# Patient Record
Sex: Female | Born: 1935
Health system: Southern US, Community
[De-identification: ages and names within clinical notes are randomized; demographics above are authoritative.]

## PROBLEM LIST (undated history)

## (undated) DIAGNOSIS — I4891 Unspecified atrial fibrillation: Secondary | ICD-10-CM

## (undated) DIAGNOSIS — I509 Heart failure, unspecified: Secondary | ICD-10-CM

## (undated) DIAGNOSIS — D500 Iron deficiency anemia secondary to blood loss (chronic): Secondary | ICD-10-CM

## (undated) DIAGNOSIS — C439 Malignant melanoma of skin, unspecified: Secondary | ICD-10-CM

## (undated) DIAGNOSIS — G4733 Obstructive sleep apnea (adult) (pediatric): Secondary | ICD-10-CM

## (undated) DIAGNOSIS — E039 Hypothyroidism, unspecified: Secondary | ICD-10-CM

## (undated) DIAGNOSIS — W19XXXA Unspecified fall, initial encounter: Secondary | ICD-10-CM

## (undated) DIAGNOSIS — C55 Malignant neoplasm of uterus, part unspecified: Secondary | ICD-10-CM

## (undated) DIAGNOSIS — K56609 Unspecified intestinal obstruction, unspecified as to partial versus complete obstruction: Secondary | ICD-10-CM

## (undated) DIAGNOSIS — I10 Essential (primary) hypertension: Secondary | ICD-10-CM

## (undated) DIAGNOSIS — K219 Gastro-esophageal reflux disease without esophagitis: Secondary | ICD-10-CM

## (undated) HISTORY — DX: Unspecified atrial fibrillation: I48.91

## (undated) HISTORY — PX: INSERT / REPLACE / REMOVE PACEMAKER: SUR710

## (undated) HISTORY — PX: COLOSTOMY: SHX63

## (undated) HISTORY — DX: Unspecified fall, initial encounter: W19.XXXA

## (undated) HISTORY — PX: CHOLECYSTECTOMY: SHX55

## (undated) HISTORY — DX: Malignant neoplasm of uterus, part unspecified: C55

## (undated) HISTORY — PX: OTHER SURGICAL HISTORY: SHX169

## (undated) HISTORY — DX: Obstructive sleep apnea (adult) (pediatric): G47.33

## (undated) HISTORY — DX: Iron deficiency anemia secondary to blood loss (chronic): D50.0

## (undated) HISTORY — DX: Gastro-esophageal reflux disease without esophagitis: K21.9

## (undated) HISTORY — DX: Malignant melanoma of skin, unspecified: C43.9

## (undated) HISTORY — PX: SKIN BIOPSY: SHX1

## (undated) HISTORY — DX: Essential (primary) hypertension: I10

## (undated) HISTORY — DX: Hypothyroidism, unspecified: E03.9

## (undated) HISTORY — DX: Unspecified intestinal obstruction, unspecified as to partial versus complete obstruction: K56.609

## (undated) HISTORY — PX: TOTAL ABDOMINAL HYSTERECTOMY W/ BILATERAL SALPINGOOPHORECTOMY: SHX83

---

## 1997-11-10 ENCOUNTER — Encounter: Admission: RE | Admit: 1997-11-10 | Discharge: 1997-11-10 | Payer: Self-pay | Admitting: Family Medicine

## 2003-05-18 ENCOUNTER — Other Ambulatory Visit: Payer: Self-pay

## 2004-03-27 ENCOUNTER — Other Ambulatory Visit: Payer: Self-pay

## 2004-03-28 ENCOUNTER — Inpatient Hospital Stay: Payer: Self-pay | Admitting: Internal Medicine

## 2004-03-28 ENCOUNTER — Other Ambulatory Visit: Payer: Self-pay

## 2004-05-08 ENCOUNTER — Ambulatory Visit: Payer: Self-pay | Admitting: Internal Medicine

## 2004-05-10 ENCOUNTER — Ambulatory Visit: Payer: Self-pay | Admitting: Unknown Physician Specialty

## 2004-05-23 ENCOUNTER — Inpatient Hospital Stay: Payer: Self-pay | Admitting: General Surgery

## 2004-12-17 ENCOUNTER — Other Ambulatory Visit: Payer: Self-pay

## 2004-12-17 ENCOUNTER — Emergency Department: Payer: Self-pay | Admitting: Unknown Physician Specialty

## 2005-04-27 ENCOUNTER — Ambulatory Visit: Payer: Self-pay | Admitting: Urology

## 2005-05-06 ENCOUNTER — Other Ambulatory Visit: Payer: Self-pay

## 2005-05-06 ENCOUNTER — Inpatient Hospital Stay: Payer: Self-pay | Admitting: Internal Medicine

## 2005-05-11 ENCOUNTER — Emergency Department: Payer: Self-pay | Admitting: Emergency Medicine

## 2005-05-11 ENCOUNTER — Other Ambulatory Visit: Payer: Self-pay

## 2005-05-18 ENCOUNTER — Ambulatory Visit: Payer: Self-pay | Admitting: Internal Medicine

## 2005-05-24 ENCOUNTER — Ambulatory Visit: Payer: Self-pay | Admitting: Internal Medicine

## 2005-06-05 ENCOUNTER — Ambulatory Visit: Payer: Self-pay | Admitting: Internal Medicine

## 2005-06-09 ENCOUNTER — Ambulatory Visit: Payer: Self-pay | Admitting: Internal Medicine

## 2005-08-18 ENCOUNTER — Ambulatory Visit: Payer: Self-pay | Admitting: Internal Medicine

## 2006-04-06 ENCOUNTER — Emergency Department: Payer: Self-pay | Admitting: Emergency Medicine

## 2006-04-06 ENCOUNTER — Other Ambulatory Visit: Payer: Self-pay

## 2006-05-29 ENCOUNTER — Ambulatory Visit: Payer: Self-pay | Admitting: Internal Medicine

## 2006-06-13 ENCOUNTER — Ambulatory Visit: Payer: Self-pay | Admitting: Internal Medicine

## 2006-06-17 ENCOUNTER — Ambulatory Visit: Payer: Self-pay | Admitting: Urology

## 2006-10-18 ENCOUNTER — Ambulatory Visit: Payer: Self-pay | Admitting: Unknown Physician Specialty

## 2006-10-27 ENCOUNTER — Other Ambulatory Visit: Payer: Self-pay

## 2006-10-27 ENCOUNTER — Emergency Department: Payer: Self-pay | Admitting: Emergency Medicine

## 2006-10-31 ENCOUNTER — Ambulatory Visit: Payer: Self-pay | Admitting: Internal Medicine

## 2006-11-20 ENCOUNTER — Ambulatory Visit: Payer: Self-pay | Admitting: Cardiology

## 2006-11-20 LAB — CONVERTED CEMR LAB
Basophils Absolute: 0.1 10*3/uL (ref 0.0–0.1)
Creatinine, Ser: 0.9 mg/dL (ref 0.4–1.2)
Eosinophils Absolute: 0.4 10*3/uL (ref 0.0–0.6)
GFR calc Af Amer: 77 mL/min
GFR calc non Af Amer: 64 mL/min
HCT: 37.1 % (ref 36.0–46.0)
Hemoglobin: 13.2 g/dL (ref 12.0–15.0)
Lymphocytes Relative: 33.9 % (ref 12.0–46.0)
MCHC: 35.5 g/dL (ref 30.0–36.0)
MCV: 89.8 fL (ref 78.0–100.0)
Monocytes Absolute: 0.5 10*3/uL (ref 0.2–0.7)
Monocytes Relative: 5.4 % (ref 3.0–11.0)
Neutro Abs: 5 10*3/uL (ref 1.4–7.7)
Neutrophils Relative %: 55.5 % (ref 43.0–77.0)
Potassium: 4.3 meq/L (ref 3.5–5.1)
Sodium: 134 meq/L — ABNORMAL LOW (ref 135–145)
aPTT: 35.1 s (ref 26.5–36.5)

## 2006-11-27 ENCOUNTER — Ambulatory Visit: Payer: Self-pay | Admitting: Internal Medicine

## 2006-11-28 ENCOUNTER — Other Ambulatory Visit: Payer: Self-pay

## 2006-12-09 ENCOUNTER — Ambulatory Visit: Payer: Self-pay | Admitting: Internal Medicine

## 2007-01-20 ENCOUNTER — Ambulatory Visit: Payer: Self-pay | Admitting: Internal Medicine

## 2007-01-21 ENCOUNTER — Ambulatory Visit: Payer: Self-pay | Admitting: Internal Medicine

## 2007-01-22 ENCOUNTER — Ambulatory Visit: Payer: Self-pay | Admitting: Internal Medicine

## 2007-01-22 ENCOUNTER — Ambulatory Visit: Payer: Self-pay | Admitting: Cardiology

## 2007-01-23 ENCOUNTER — Other Ambulatory Visit: Payer: Self-pay

## 2007-02-04 ENCOUNTER — Ambulatory Visit: Payer: Self-pay | Admitting: Internal Medicine

## 2007-03-12 ENCOUNTER — Ambulatory Visit: Payer: Self-pay | Admitting: Internal Medicine

## 2007-05-13 ENCOUNTER — Ambulatory Visit: Payer: Self-pay | Admitting: Internal Medicine

## 2007-08-12 ENCOUNTER — Ambulatory Visit: Payer: Self-pay | Admitting: Internal Medicine

## 2007-09-23 ENCOUNTER — Ambulatory Visit: Payer: Self-pay | Admitting: Internal Medicine

## 2008-01-11 ENCOUNTER — Emergency Department: Payer: Self-pay | Admitting: Emergency Medicine

## 2008-03-22 ENCOUNTER — Ambulatory Visit: Payer: Self-pay | Admitting: Internal Medicine

## 2008-08-12 ENCOUNTER — Ambulatory Visit: Payer: Self-pay | Admitting: Internal Medicine

## 2008-09-16 ENCOUNTER — Ambulatory Visit: Payer: Self-pay

## 2008-09-17 ENCOUNTER — Encounter: Payer: Self-pay | Admitting: Internal Medicine

## 2009-01-11 ENCOUNTER — Emergency Department: Payer: Self-pay | Admitting: Emergency Medicine

## 2009-01-18 ENCOUNTER — Ambulatory Visit: Payer: Self-pay | Admitting: Internal Medicine

## 2009-01-22 DIAGNOSIS — Z950 Presence of cardiac pacemaker: Secondary | ICD-10-CM

## 2009-01-22 DIAGNOSIS — I10 Essential (primary) hypertension: Secondary | ICD-10-CM | POA: Insufficient documentation

## 2009-01-22 DIAGNOSIS — I4891 Unspecified atrial fibrillation: Secondary | ICD-10-CM

## 2009-01-24 ENCOUNTER — Ambulatory Visit: Payer: Self-pay | Admitting: Internal Medicine

## 2009-04-13 ENCOUNTER — Ambulatory Visit: Payer: Self-pay

## 2009-04-13 ENCOUNTER — Telehealth: Payer: Self-pay | Admitting: Internal Medicine

## 2009-04-13 ENCOUNTER — Encounter: Payer: Self-pay | Admitting: Internal Medicine

## 2009-07-12 ENCOUNTER — Ambulatory Visit: Payer: Self-pay | Admitting: Internal Medicine

## 2009-07-12 ENCOUNTER — Encounter: Payer: Self-pay | Admitting: Cardiovascular Disease

## 2009-07-20 ENCOUNTER — Ambulatory Visit: Payer: Self-pay | Admitting: Cardiovascular Disease

## 2009-08-16 ENCOUNTER — Ambulatory Visit: Payer: Self-pay | Admitting: Internal Medicine

## 2009-09-12 ENCOUNTER — Ambulatory Visit: Payer: Self-pay | Admitting: Internal Medicine

## 2009-09-19 ENCOUNTER — Encounter: Payer: Self-pay | Admitting: Internal Medicine

## 2009-09-19 ENCOUNTER — Telehealth: Payer: Self-pay | Admitting: Internal Medicine

## 2009-09-20 ENCOUNTER — Ambulatory Visit: Payer: Self-pay | Admitting: Internal Medicine

## 2009-09-22 ENCOUNTER — Telehealth: Payer: Self-pay | Admitting: Internal Medicine

## 2009-10-07 ENCOUNTER — Emergency Department: Payer: Self-pay | Admitting: Unknown Physician Specialty

## 2009-10-13 ENCOUNTER — Ambulatory Visit: Payer: Self-pay | Admitting: Internal Medicine

## 2009-10-27 ENCOUNTER — Encounter: Payer: Self-pay | Admitting: Internal Medicine

## 2009-11-08 ENCOUNTER — Ambulatory Visit: Payer: Self-pay | Admitting: Internal Medicine

## 2009-11-08 DIAGNOSIS — R531 Weakness: Secondary | ICD-10-CM

## 2009-12-06 ENCOUNTER — Ambulatory Visit: Payer: Self-pay | Admitting: Internal Medicine

## 2010-01-03 ENCOUNTER — Emergency Department: Payer: Self-pay | Admitting: Emergency Medicine

## 2010-04-03 ENCOUNTER — Ambulatory Visit: Payer: Self-pay | Admitting: Internal Medicine

## 2010-04-06 ENCOUNTER — Telehealth: Payer: Self-pay | Admitting: Internal Medicine

## 2010-04-12 ENCOUNTER — Ambulatory Visit: Payer: Self-pay | Admitting: Urology

## 2010-04-26 ENCOUNTER — Ambulatory Visit: Payer: Self-pay | Admitting: Urology

## 2010-07-11 NOTE — Progress Notes (Signed)
Summary: SURGICAL CLEARANCE  Phone Note From Other Clinic Call back at [phone]   Caller: LISA @ [facility] Call For: Kathryn Short Summary of Call: NEEDS SURGICAL CLEARANCE-A MESSAGE WAS LEFT ON OUR PHONE Initial call taken by: Harlon Flor,  April 06, 2010 12:27 PM  Follow-up for Phone Call        LM with Gery Pray at [facility] will call back. Benedict Needy, RN  April 06, 2010 12:40 PM   Pt having  Cystobladder biopsy 11/16 needs cardiac clearance please advise. Benedict Needy, RN  April 10, 2010 5:15 PM   Additional Follow-up for Phone Call Additional follow up Details #1::        ok for surgery Additional Follow-up by: Nathen May, MD, [facility],  April 10, 2010 6:38 PM     Appended Document: SURGICAL CLEARANCE letter faxed

## 2010-07-11 NOTE — Assessment & Plan Note (Signed)
Summary: per check out/sf   Primary Provider:  Corliss Parish  CC:  pt here for follow up.  She has not felt well in the last few days.  Marland Kitchen  History of Present Illness: MsPerkins  is seen because of recent episodes of weaknessand dizziness. He did take to the initiation of a loop diuretic started because of peripheral edema. The medication was stopped about 10 days or so ago and her symptoms have improved somewhat.  She describes orthostatic dizziness. There is a whirling sensation that goes with this that is relatively slow. It is worse in the morning. It is aggravated by turning. It is not aggravated by showers. She also describes that she could not get her words out correctly and adhering other people's words sounded also mixed up. However, her daughter who is sitting with her, says that at no point was her speech not clear.  She has normal left ventricular function by echo in 2008.  Her dizziness is still somewhat sporadic. He seems to be able to track A2 relative low blood pressure with systolics in the 100-110 range.  Current Medications (verified): 1)  Hyzaar 50-12.5 Mg Tabs (Losartan Potassium-Hctz) .... Daily 2)  Synthroid 25 Mcg Tabs (Levothyroxine Sodium) .... Take One Tablet Once Daily 3)  Metoprolol Tartrate 25 Mg Tabs (Metoprolol Tartrate) .... Two Times A Day 4)  Diltiazem Hcl 120 Mg Tabs (Diltiazem Hcl) .... Daily 5)  Methenamine Mandelate 1 Gm Tabs (Methenamine Mandelate) .... Daily 6)  Coumadin 5 Mg Tabs (Warfarin Sodium) .... As Directed 7)  Macrodantin 100 Mg Caps (Nitrofurantoin Macrocrystal) .... As Directed 8)  Zolpidem Tartrate 5 Mg Tabs (Zolpidem Tartrate) .... Take One Tablet At Bedtime 9)  Ester-C  Tabs (Bioflavonoid Products) .... Once Daily 10)  Omeprazole 20 Mg Cpdr (Omeprazole) .... Take One Capsule Once Daily  Allergies (verified): No Known Drug Allergies  Past History:  Past Medical History: Last updated: 01/22/2009 atrial fibrillation Pacemaker  Implantation-Medtronic  Hypertension Obstructive sleep apnea on BiPAP Gastroesophageal reflux disease Uterine cancer status post radiation therapy back in 1952 Hypothyroidism, treated Melanoma resected from her back Total abdominal hysterectomy and bilateral salpingo-oophorectomy  Colostomy for perforation. Cholecystectomy. Cystoscopy     Past Surgical History: Last updated: 01/22/2009 Total hysterectomy Implantation of Pacemaker-medtronic  Family History: Last updated: 01/22/2009 noncontributory  Social History: Last updated: 01/22/2009 Widowed-5 children Tobacco Use - No.  Alcohol Use - no  Vital Signs:  Patient profile:   77 year old female Height:      63 inches Pulse rate:   62 / minute Pulse rhythm:   regular BP sitting:   112 / 62  (left arm) Cuff size:   large  Vitals Entered By: Judithe Modest CMA (July 12, 2009 1:50 PM)  Physical Exam  General:  The patient was alert and oriented in no acute distress. HEENT Normal.  Neck veins were flat, carotids were brisk.  Lungs were clear.  Heart sounds were regular without murmurs or gallops.  Abdomen was soft with active bowel sounds. There is no clubbing cyanosis or edema. Skin Warm and dry    EKG  Procedure date:  07/12/2009  Findings:       atrial paced rhythm at 62 Intervals 0.18/0.09/0.43 Axis-37 T Wave flattening in the lateral leads  PPM Specifications Following MD:  Sherryl Manges, MD     PPM Vendor:  Medtronic     PPM Model Number:  ADDR01     PPM Serial Number:  [id number] PPM DOI:  11/27/2006     PPM Implanting MD:  Sherryl Manges, MD  Lead 1    Location: RA     DOI: 11/27/2006     Model #: 4782     Serial #: [id number]     Status: active Lead 2    Location: RV     DOI: 11/27/2006     Model #: 7846     Serial #: [id number]     Status: active  Magnet Response Rate:  BOL 85 ERI 65  Indications:  AFIB   PPM Follow Up Remote Check?  No Battery Voltage:  2.78 V     Battery Est.  Longevity:  7 years     Pacer Dependent:  No       PPM Device Measurements Atrium  Amplitude: 4.0 mV, Impedance: 376 ohms, Threshold: 0.75 V at 0.4 msec Right Ventricle  Amplitude: 5.6 mV, Impedance: 402 ohms, Threshold: 0.875 V at 0.4 msec  Episodes MS Episodes:  39     Percent Mode Switch:  10.1%     Coumadin:  Yes Ventricular High Rate:  0     Atrial Pacing:  82.8%     Ventricular Pacing:  0.2%  Parameters Mode:  MVP (R)     Lower Rate Limit:  60     Upper Rate Limit:  130 Paced AV Delay:  180     Sensed AV Delay:  140 Next Cardiology Appt Due:  01/09/2010 Tech Comments:  No parameter changes.  39 mode switch episodes, the longest 48:12 hours, with 0.7% heart rates >130bpm.  ROV 6 months [place] [facility]. Altha Harm, LPN  July 12, 2009 2:24 PM   Impression & Recommendations:  Problem # 1:  ATRIAL FIBRILLATION (ICD-427.31) Scontinues to have paroxysms of atrial fibrillation without evidence of rapid ventricular rates Her updated medication list for this problem includes:    Metoprolol Tartrate 25 Mg Tabs (Metoprolol tartrate) .Marland Kitchen..Marland Kitchen Two times a day    Coumadin 5 Mg Tabs (Warfarin sodium) .Marland Kitchen... As directed  Problem # 2:  DIZZINESS (ICD-780.4) Tseems to correlate with lower blood pressure. Will plan to change her diltiazem from the morning tonight and see if this doesn't help.  Problem # 3:  PACEMAKER, PERMANENT (ICD-V45.01) Device parameters and data were reviewed and no changes were made   Orders: EKG w/ Interpretation (93000)  Patient Instructions: 1)  Your physician has recommended you make the following change in your medication: Take your Diltizem at night 2)  Your physician recommends that you schedule a follow-up appointment in: 6 months with Gunnar Fusi in [place]

## 2010-07-11 NOTE — Assessment & Plan Note (Signed)
Summary: 4wk f/u sl after this visit pt will f/u in [place] per kle...   Primary Provider:  Dr. Marcello Fennel  CC:  4 week follow up.  Pt states she has been having a difficult time with concentration.  She reports that since last Afib episode she has felt this way.  .  History of Present Illness: Kathryn Short is seen in followup from her [place] with orthostasis dizziness and confusion.  She has a history of atrial fibrillation and bradycardia with prior pacemaker implantation. At her last visit we discontinued her metoprolol and decreased her amiodarone from 400-200  She comes in today continuing to complain of dizziness and weakness in her legs and difficulty with mentation and concentration.  Since having last been seen she underwent ultrasonography at [facility] demonstrating normal left ventricular function with moderate MR   She has no history of coronary disease. She recalls having had a stress test at [facility]; we are unable to find records.     Current Medications (verified): 1)  Synthroid 25 Mcg Tabs (Levothyroxine Sodium) .... Take One Tablet Once Daily 2)  Diltiazem Hcl 120 Mg Tabs (Diltiazem Hcl) .... At Bedtime 3)  Methenamine Mandelate 1 Gm Tabs (Methenamine Mandelate) .... Daily 4)  Coumadin 5 Mg Tabs (Warfarin Sodium) .... As Directed 5)  Macrodantin 100 Mg Caps (Nitrofurantoin Macrocrystal) .... As Directed 6)  Zolpidem Tartrate 5 Mg Tabs (Zolpidem Tartrate) .... Take One Tablet At Bedtime 7)  Ester-C  Tabs (Bioflavonoid Products) .... Once Daily 8)  Amiodarone Hcl 400 Mg Tabs (Amiodarone Hcl) .... , 1/2 Tab By Mouth Daily 9)  Furosemide 20 Mg Tabs (Furosemide) .... Take One Tablet By Mouth-Pt Takes Very Rarely 10)  Hyzaar 50-12.5 Mg Tabs (Losartan Potassium-Hctz) .... Take 1 Tablet By Mouth Once A Day 11)  Warfarin Sodium 1 Mg Tabs (Warfarin Sodium) .... Use As Directed By Anticoagualtion Clinic  Allergies (verified): No Known Drug Allergies  Past History:  Past Medical  History: Last updated: 01/22/2009 atrial fibrillation Pacemaker Implantation-Medtronic  Hypertension Obstructive sleep apnea on BiPAP Gastroesophageal reflux disease Uterine cancer status post radiation therapy back in 1952 Hypothyroidism, treated Melanoma resected from her back Total abdominal hysterectomy and bilateral salpingo-oophorectomy  Colostomy for perforation. Cholecystectomy. Cystoscopy     Past Surgical History: Last updated: 01/22/2009 Total hysterectomy Implantation of Pacemaker-medtronic  Family History: Last updated: 01/22/2009 noncontributory  Social History: Last updated: 01/22/2009 Widowed-5 children Tobacco Use - No.  Alcohol Use - no  Vital Signs:  Patient profile:   77 year old female Height:      63 inches Weight:      150 pounds BMI:     26.67 Pulse rate:   71 / minute Pulse rhythm:   regular BP sitting:   130 / 72  (left arm) Cuff size:   regular  Vitals Entered By: Judithe Modest CMA (Nov 08, 2009 3:42 PM)  Physical Exam  General:  The patient was alert and oriented in no acute distress. HEENT Normal.  Neck veins were flat, carotids were brisk.  Lungs were clear.  Heart sounds were regular without murmurs or gallops.  Abdomen was soft with active bowel sounds. There is no clubbing cyanosis; trace edema she is wearing support stockings Skin Warm and dry    EKG  Procedure date:  10/13/2009  Findings:      an atrial paced rhythm at 67 intervals 0.26/0.11/0.  PPM Specifications Following MD:  Sherryl Manges, MD     PPM Vendor:  Medtronic  PPM Model Number:  ADDR01     PPM Serial Number:  [id number] PPM DOI:  11/27/2006     PPM Implanting MD:  Sherryl Manges, MD  Lead 1    Location: RA     DOI: 11/27/2006     Model #: 0865     Serial #: [id number]     Status: active Lead 2    Location: RV     DOI: 11/27/2006     Model #: 8413     Serial #: [id number]     Status: active  Magnet Response Rate:  BOL 85 ERI 65  Indications:   AFIB   PPM Follow Up Battery Voltage:  2.77 V     Battery Est. Longevity:  6.5 yrs     Pacer Dependent:  No       PPM Device Measurements Atrium  Amplitude: PACED mV, Impedance: 393 ohms, Threshold: 1.00 V at 0.40 msec Right Ventricle  Amplitude: 8.00 mV, Impedance: 423 ohms, Threshold: 1.00 V at 0.40 msec  Episodes MS Episodes:  0     Percent Mode Switch:  O     Coumadin:  Yes Ventricular High Rate:  O     Atrial Pacing:  91.9%     Ventricular Pacing:  0.3%  Parameters Mode:  MVP (R)     Lower Rate Limit:  60     Upper Rate Limit:  130 Paced AV Delay:  180     Sensed AV Delay:  140 Tech Comments:  NO EPISODES SINCE LAST CHECK ON 10-13-09.  NORMAL DEVICE FUNCTION.  CHANGED RV AMPLITUDE FROM 2.00 TO 2.50 V.  ROV IN 6 MTHS AT [place].  Vella Kohler  Nov 08, 2009 4:09 PM  Impression & Recommendations:  Problem # 1:  ORTHOSTATIC DIZZINESS (ICD-780.4) her blood pressure is much better today. She continues to be quite weak and fatigued. I wonder whether this is a diltiazem. We'll plan to put her on hold and see how she does she is to let us know next couple of weeks  Problem # 2:  WEAKNESS (ICD-780.79) her weakness is described as difficulty with her legs as well as poor concentration. I wonder whether this is a manifestation of amiodarone neurotoxicity. Will plan to discontinue the amiodarone for now and see how it is that she does  Problem # 3:  PACEMAKER, PERMANENT (ICD-V45.01) Device parameters and data were reviewed and no changes were made  Problem # 4:  ATRIAL FIBRILLATION (ICD-427.31) she is currently holding sinus rhythm hopefully as we try to sort out her medication issues she will continue in sinus rhythm. Given that she has reversion to atrial fibrillation I've advised her daughter to give her diltiazem 120 mg daily Her updated medication list for this problem includes:    Coumadin 5 Mg Tabs (Warfarin sodium) .Marland Kitchen... As directed    Amiodarone Hcl 400 Mg Tabs (Amiodarone  hcl) .Marland Kitchen... , 1/2 tab by mouth daily    Warfarin Sodium 1 Mg Tabs (Warfarin sodium) ..... Use as directed by anticoagualtion clinic   Patient Instructions: 1)  Your physician recommends that you schedule a follow-up appointment in: 6-8 week 2)   hold amiodarone and diltiazem

## 2010-07-11 NOTE — Progress Notes (Signed)
Summary: c/o pulse rate   Phone Note Call from Patient Call back at Home Phone [phone] Call back at Work Phone  Call back at [phone]   Caller: Daughter- ann ruff Reason for Call: Talk to Nurse Summary of Call: pt seen this week b/c of b/p running high. c/o pulse on yesterday 64 . Initial call taken by: Lorne Skeens,  September 22, 2009 1:56 PM  Follow-up for Phone Call        Spoke with daughter [name]. She states pt's B/P is find . Daughter wants to know if heart rate of 64b/min NSR  its okay pt. has no c/o. I let daughter know a HR of 64 b/min it is fine as long it is in normal sinus rhythm. Pt's daughter verbalized understanding. Follow-up by: Ollen Gross, RN, BSN,  September 22, 2009 3:09 PM

## 2010-07-11 NOTE — Assessment & Plan Note (Signed)
Summary: F/U 4 MONTH/NE   Visit Type:  Follow-up Primary Provider:  Dr. Marcello Fennel  CC:  "Doing well"..  History of Present Illness: Kathryn Short is seen in followup   atrial fibrillation and bradycardia with prior pacemaker implantation.  She's also had significant symptoms of orthostatic dizziness, generalized malaise and fatigue.Last spring we held her diltiazem and her amiodarone to see if these were contributing to her symptoms; the markedly improved and we resumed her diltiazem to assist with rate control for her paroxysms of atrial fibrillation   She had done great on her current meds   recent ultrasonography at [facility] demonstrating normal left ventricular function with moderate MR   She has no history of coronary disease. She recalls having had a stress test at [facility]; we are unable to find records.     Current Medications (verified): 1)  Synthroid 50 Mcg Tabs (Levothyroxine Sodium) .... Take 1 Tablet By Mouth Once A Day 2)  Diltiazem Hcl 120 Mg Tabs (Diltiazem Hcl) .... At Bedtime 3)  Coumadin 5 Mg Tabs (Warfarin Sodium) .... As Directed 4)  Hyzaar 50-12.5 Mg Tabs (Losartan Potassium-Hctz) .... Take 1 Tablet By Mouth Once A Day 5)  Tmp/smz Ds .Marland KitchenMarland Kitchen. 160-800 Mg 1 Once Daily 6)  Multivitamins   Tabs (Multiple Vitamin) .... Once Daily 7)  Vitamin B Complex-C   Caps (B Complex-C) .... Once Daily 8)  Melatonin 300 Mcg Tabs (Melatonin) .Marland Kitchen.. 1-2 At Bedtime  Allergies (verified): No Known Drug Allergies  Past History:  Past Medical History: Last updated: 01/22/2009 atrial fibrillation Pacemaker Implantation-Medtronic  Hypertension Obstructive sleep apnea on BiPAP Gastroesophageal reflux disease Uterine cancer status post radiation therapy back in 1952 Hypothyroidism, treated Melanoma resected from her back Total abdominal hysterectomy and bilateral salpingo-oophorectomy  Colostomy for perforation. Cholecystectomy. Cystoscopy     Past Surgical History: Last updated:  01/22/2009 Total hysterectomy Implantation of Pacemaker-medtronic  Family History: Last updated: 01/22/2009 noncontributory  Social History: Last updated: 01/22/2009 Widowed-5 children Tobacco Use - No.  Alcohol Use - no  Risk Factors: Smoking Status: never (01/22/2009)  Vital Signs:  Patient profile:   77 year old female Height:      63 inches Weight:      147 pounds BMI:     26.13 Pulse rate:   74 / minute BP sitting:   140 / 78  (right arm) Cuff size:   regular  Vitals Entered By: Bishop Dublin, CMA (April 03, 2010 9:40 AM)  Physical Exam  General:  The patient was alert and oriented in no acute distress. HEENT Normal.  Neck veins were flat, carotids were brisk.  Lungs were clear.  Heart sounds were regular without murmurs or gallops.  Abdomen was soft with active bowel sounds. There is no clubbing cyanosis or edema. Skin Warm and dry    PPM Specifications Following MD:  Sherryl Manges, MD     PPM Vendor:  Medtronic     PPM Model Number:  ADDR01     PPM Serial Number:  [id number] PPM DOI:  11/27/2006     PPM Implanting MD:  Sherryl Manges, MD  Lead 1    Location: RA     DOI: 11/27/2006     Model #: 4098     Serial #: [id number]     Status: active Lead 2    Location: RV     DOI: 11/27/2006     Model #: 6213     Serial #: [id number]     Status: active  Magnet  Response Rate:  BOL 85 ERI 65  Indications:  AFIB   PPM Follow Up Remote Check?  No Battery Voltage:  2.78 V     Battery Est. Longevity:  6.5 years     Pacer Dependent:  No       PPM Device Measurements Atrium  Amplitude: 4.0 mV, Impedance: 407 ohms, Threshold: 0.75 V at 0.4 msec Right Ventricle  Amplitude: 8.0 mV, Impedance: 433 ohms, Threshold: 0.875 V at 0.4 msec  Episodes MS Episodes:  0     Percent Mode Switch:  0     Coumadin:  Yes Ventricular High Rate:  0     Atrial Pacing:  63.7%     Ventricular Pacing:  0.2%  Parameters Mode:  MVP (R)     Lower Rate Limit:  60     Upper Rate Limit:   130 Paced AV Delay:  180     Sensed AV Delay:  140 Next Cardiology Appt Due:  09/10/2010 Tech Comments:  No parameter changes.  Device function normal.  No [facility] @ this time.  ROV 6 months [place] [facility]. Altha Harm, LPN  April 03, 2010 10:07 AM   Impression & Recommendations:  Problem # 1:  WEAKNESS (ICD-780.79) resolved  Problem # 2:  PACEMAKER, PERMANENT (ICD-V45.01) Device parameters and data were reviewed and no changes were made  Problem # 3:  HYPERTENSION (ICD-401.9) stab;le Her updated medication list for this problem includes:    Diltiazem Hcl 120 Mg Tabs (Diltiazem hcl) .Marland Kitchen... At bedtime    Hyzaar 50-12.5 Mg Tabs (Losartan potassium-hctz) .Marland Kitchen... Take 1 tablet by mouth once a day  Problem # 4:  ATRIAL FIBRILLATION (ICD-427.31) no recurrent af Her updated medication list for this problem includes:    Coumadin 5 Mg Tabs (Warfarin sodium) .Marland Kitchen... As directed  Patient Instructions: 1)  Your physician recommends that you schedule a follow-up appointment in: 4months

## 2010-07-11 NOTE — Assessment & Plan Note (Signed)
Summary: BP CHECK/AMD   Visit Type:  rov Primary Provider:  Dr. Marcello Fennel   History of Present Illness: Mrs Porcelli is seen as an add-on today because of orthostatic lightheadedness palpitations in the setting of known PAF bradycardia with prior pacemaker implantation.  Her  orthostasis has been primarily in the morning and at night. She's had presyncope but no syncope. Her blood pressure has been quite labile over the last months also and there had been effort to try to adjust her medications to accommodate.  She has no history of coronary disease. She recalls having had a stress test at [facility]; we are unable to find records.  She's also had problems with her atrial fibrillation be quite rapid. AV Node blocking agents have been a challenge because of a low blood pressure  Current Problems (verified): 1)  Dizziness  (ICD-780.4) 2)  Pacemaker, Permanent  (ICD-V45.01) 3)  Atrial Fibrillation  (ICD-427.31) 4)  Hypertension  (ICD-401.9)  Current Medications (verified): 1)  Hyzaar 50-12.5 Mg Tabs (Losartan Potassium-Hctz) .... Daily 2)  Synthroid 25 Mcg Tabs (Levothyroxine Sodium) .... Take One Tablet Once Daily 3)  Metoprolol Tartrate 25 Mg Tabs (Metoprolol Tartrate) .... Two Times A Day 4)  Diltiazem Hcl 120 Mg Tabs (Diltiazem Hcl) .... At Bedtime 5)  Methenamine Mandelate 1 Gm Tabs (Methenamine Mandelate) .... Daily 6)  Coumadin 5 Mg Tabs (Warfarin Sodium) .... As Directed 7)  Macrodantin 100 Mg Caps (Nitrofurantoin Macrocrystal) .... As Directed 8)  Zolpidem Tartrate 5 Mg Tabs (Zolpidem Tartrate) .... Take One Tablet At Bedtime 9)  Ester-C  Tabs (Bioflavonoid Products) .... Once Daily 10)  Omeprazole 20 Mg Cpdr (Omeprazole) .... Take One Capsule Once Daily  Allergies (verified): No Known Drug Allergies  Past History:  Past Medical History: Last updated: 01/22/2009 atrial fibrillation Pacemaker Implantation-Medtronic  Hypertension Obstructive sleep apnea on BiPAP Gastroesophageal  reflux disease Uterine cancer status post radiation therapy back in 1952 Hypothyroidism, treated Melanoma resected from her back Total abdominal hysterectomy and bilateral salpingo-oophorectomy  Colostomy for perforation. Cholecystectomy. Cystoscopy     Past Surgical History: Last updated: 01/22/2009 Total hysterectomy Implantation of Pacemaker-medtronic  Family History: Last updated: 01/22/2009 noncontributory  Social History: Last updated: 01/22/2009 Widowed-5 children Tobacco Use - No.  Alcohol Use - no  Risk Factors: Smoking Status: never (01/22/2009)  Vital Signs:  Patient profile:   77 year old female Height:      63 inches Pulse rate:   76 / minute Pulse rhythm:   irregular BP sitting:   124 / 77  (left arm) Cuff size:   regular  Vitals Entered By: Mercer Pod (September 12, 2009 4:28 PM)  Physical Exam  General:  The patient was alert and oriented in no acute distress.Neck veins were flat, carotids were brisk. Lungs were clear. Heart sounds were irregular without murmurs or gallops. Abdomen was soft with active bowel sounds. There is no clubbing cyanosis or edema.she is hard of hearing, and affect is engaging    EKG  Procedure date:  09/12/2009  Findings:      he comfortably with a rapid ventricular response at 107 Intervals-/2007/23 3 Axis -28  PPM Specifications Following MD:  Sherryl Manges, MD     PPM Vendor:  Medtronic     PPM Model Number:  ADDR01     PPM Serial Number:  [id number] PPM DOI:  11/27/2006     PPM Implanting MD:  Sherryl Manges, MD  Lead 1    Location: RA     DOI:  11/27/2006     Model #: [id number]     Serial #: [id number]     Status: active Lead 2    Location: RV     DOI: 11/27/2006     Model #: [id number]     Serial #: [id number]     Status: active  Magnet Response Rate:  BOL 85 ERI 65  Indications:  AFIB   PPM Follow Up Remote Check?  No Battery Voltage:  2.78 V     Battery Est. Longevity:  6.5 years     Pacer Dependent:  No        PPM Device Measurements Atrium  Amplitude: 0.7 mV, Impedance: 376 ohms,  Right Ventricle  Amplitude: 8.0 mV, Impedance: 413 ohms,   Episodes MS Episodes:  29     Percent Mode Switch:  11.6%     Coumadin:  Yes Ventricular High Rate:  0     Atrial Pacing:  90.5%     Ventricular Pacing:  0.2%  Parameters Mode:  MVP (R)     Lower Rate Limit:  60     Upper Rate Limit:  130 Paced AV Delay:  180     Sensed AV Delay:  140 Tech Comments:  Ms. Voorheis was seen today as an add on because of rapid heart rates and near syncope.  A-fib with RVR.  > 50% heart rates in A-fib > 100bpm.  Total time in A-fib since 07/12/09 is 11.6%. Altha Harm, LPN  September 12, 3873 4:30 PM   Impression & Recommendations:  Problem # 1:  ATRIAL FIBRILLATION (ICD-427.31) Sis a history of atrial fibrillation is paroxysmal. She is quite aware of her paroxysms. There is partially also associated with a rapid ventricular response exercise intolerance and poor mentation. It also seems that the orthostatic intolerance is considerably worse when she is in atrial fibrillation.  We reviewed them variety of treatment strategies including Augmented rate control with digoxin, antiarrhythmic therapy including amiodarone and flecainide, and AV junction ablation. We discussed benefits and side effects. Because we don't have access to her stress test results, and given her age and the need for rate control, we will begin her on amiodarone.   We'll anticipate cardioversion next 48 hours if she has not converted spontaneously which has been her want in the past. Her updated medication list for this problem includes:    Metoprolol Tartrate 25 Mg Tabs (Metoprolol tartrate) .Marland Kitchen..Marland Kitchen Two times a day    Coumadin 5 Mg Tabs (Warfarin sodium) .Marland Kitchen... As directed    Digoxin 0.125 Mg Tabs (Digoxin) .Marland Kitchen... Take a half  tablet by mouth every 8 hours for 24 hours then daily    Amiodarone Hcl 400 Mg Tabs (Amiodarone hcl) .Marland Kitchen... Take one tablet by mouth twice a day  for 10 days, then take one tablet by mouth daily for 2 weeks, then 1/2 tab by mouth daily  H  Problem # 2:  ORTHOSTATIC DIZZINESS (ICD-780.4) her orthostatic dizziness is quite concerning. She is certainly at risk for falling. We'll plan to discontinue her Hyzaar both for blood pressure checks as well as his diuretic effects. I've also asked him to raise the head of her bed 6 inches to try to improve her response to standing occasionally at night when her symptoms have been most striking  Problem # 3:  PACEMAKER, PERMANENT (ICD-V45.01) Device parameters and data were reviewed and no changes were made.  the rapid rate during atrial fibrillation paroxysms however was reviewed with the patient  as one of her thoughts was to "just quit everything"  Problem # 4:  HYPERTENSION (ICD-401.9) This point he would have to tolerate some degree of hypertension to prevent her orthostatic hypotension. I am worried to some degree about the potential for amiodarone to aggravate autonomic intolerances. This however is quite uncommon. The following medications were removed from the medication list:    Hyzaar 50-12.5 Mg Tabs (Losartan potassium-hctz) .Marland Kitchen... Daily Her updated medication list for this problem includes:    Metoprolol Tartrate 25 Mg Tabs (Metoprolol tartrate) .Marland Kitchen..Marland Kitchen Two times a day    Diltiazem Hcl 120 Mg Tabs (Diltiazem hcl) .Marland Kitchen... At bedtime  Other Orders: T-Hepatic Function [id number]) T-TSH [id number])  Patient Instructions: 1)  Your physician recommends that you schedule a follow-up appointment in: 4 weeks 2)  Your physician has recommended you make the following change in your medication: start digoxin 0.0625 mg every 8 hours for 24 hours then daily, start amiodarone 400 mg twice a day for 2 weeks, then daily for 1 month, then 1/2 tablet daily. 3)  Please call us Wednesday to let us know how you are feeling. 4)  Please call your PCP and let them know that you have been started on digoxin and  amiodarone.  You will need to have your coumadin dose reduced by 30%.   Prescriptions: AMIODARONE HCL 400 MG TABS (AMIODARONE HCL) Take one tablet by mouth twice a day for 14 days, then take one tablet by mouth daily for 1 month, then 1/2 tab by mouth daily  #60 x 6   Entered by:   Charlena Cross, RN, BSN   Authorized by:   Nathen May, MD, [facility]   Signed by:   Charlena Cross, RN, BSN on 09/12/2009   Method used:   Electronically to        [facility] [phone]* (retail)       [address] [place], [place]  [address]       Ph: [phone]       Fax: [phone]   RxID:   [id number] DIGOXIN 0.125 MG TABS (DIGOXIN) Take a half  tablet by mouth every 8 hours for 24 hours then daily  #30 x 6   Entered by:   Charlena Cross, RN, BSN   Authorized by:   Nathen May, MD,  [facility]   Signed by:   Charlena Cross, RN, BSN on 09/12/2009   Method used:   Electronically to        [facility] [phone]* (retail)       [address] [place], [place]  [address]       Ph: [phone]       Fax: [phone]   RxID:   [id number]   Appended Document: BP CHECK/AMD    Clinical Lists Changes  Orders: Added new Test order of T-Hepatic Function [phone]) - Signed Added new Test order of T-TSH [id number]) - Signed

## 2010-07-11 NOTE — Assessment & Plan Note (Signed)
Summary: DEVICE/SAF   Visit Type:  Follow-up Primary Provider:  Dr. Marcello Fennel  CC:  Fatigue and weakness.Marland Kitchen  History of Present Illness: Mrs Wheelwright is seen in followup   atrial fibrillation and bradycardia with prior pacemaker implantation.  She's also had significant symptoms of orthostatic dizziness, generalized malaise and fatigue. At her last visit we held her diltiazem and her amiodarone to see if these were contributing to her symptoms.   She is much betterwith loss of nausea and gait disturbance. There is significantly less confusion. Her energy level remains poor. She describes muscle weakness but no muscle aches.   recent ultrasonography at [facility] demonstrating normal left ventricular function with moderate MR   She has no history of coronary disease. She recalls having had a stress test at [facility]; we are unable to find records.     Current Medications (verified): 1)  Synthroid 25 Mcg Tabs (Levothyroxine Sodium) .... Take One Tablet Once Daily 2)  Diltiazem Hcl 120 Mg Tabs (Diltiazem Hcl) .... At Bedtime 3)  Coumadin 5 Mg Tabs (Warfarin Sodium) .... As Directed 4)  Zolpidem Tartrate 5 Mg Tabs (Zolpidem Tartrate) .... Take One Tablet At Bedtime 5)  Hyzaar 50-12.5 Mg Tabs (Losartan Potassium-Hctz) .... Take 1 Tablet By Mouth Once A Day 6)  Tmp/smz Ds .Marland KitchenMarland Kitchen. 160-800 Mg 1 Once Daily  Allergies (verified): No Known Drug Allergies  Past History:  Past Medical History: Last updated: 01/22/2009 atrial fibrillation Pacemaker Implantation-Medtronic  Hypertension Obstructive sleep apnea on BiPAP Gastroesophageal reflux disease Uterine cancer status post radiation therapy back in 1952 Hypothyroidism, treated Melanoma resected from her back Total abdominal hysterectomy and bilateral salpingo-oophorectomy  Colostomy for perforation. Cholecystectomy. Cystoscopy     Past Surgical History: Last updated: 01/22/2009 Total hysterectomy Implantation of Pacemaker-medtronic  Family  History: Last updated: 01/22/2009 noncontributory  Social History: Last updated: 01/22/2009 Widowed-5 children Tobacco Use - No.  Alcohol Use - no  Risk Factors: Smoking Status: never (01/22/2009)  Vital Signs:  Patient profile:   77 year old female Height:      63 inches Weight:      152 pounds BMI:     27.02 Pulse rate:   90 / minute BP sitting:   137 / 78  (left arm)  Vitals Entered By: Bishop Dublin, CMA (December 06, 2009 4:05 PM)  Physical Exam  General:  The patient was alert and oriented in no acute distress. HEENT Normal.  Neck veins were flat, carotids were brisk.  Lungs were clear.  Heart sounds were regular without murmurs or gallops.  Abdomen was soft with active bowel sounds. There is no clubbing cyanosis or edema. Skin Warm and dry difficulty hearing   PPM Specifications Following MD:  Sherryl Manges, MD     PPM Vendor:  Medtronic     PPM Model Number:  ADDR01     PPM Serial Number:  [id number] PPM DOI:  11/27/2006     PPM Implanting MD:  Sherryl Manges, MD  Lead 1    Location: RA     DOI: 11/27/2006     Model #: 6440     Serial #: [id number]     Status: active Lead 2    Location: RV     DOI: 11/27/2006     Model #: 8756     Serial #: [id number]     Status: active  Magnet Response Rate:  BOL 85 ERI 65  Indications:  AFIB   PPM Follow Up Battery Voltage:  2.77 V  Battery Est. Longevity:  6.5 YRS     Pacer Dependent:  No      Episodes MS Episodes:  0     Coumadin:  Yes Ventricular High Rate:  0     Atrial Pacing:  56%      Parameters Mode:  MVP (R)     Lower Rate Limit:  60     Upper Rate Limit:  130 Paced AV Delay:  180     Sensed AV Delay:  140 Tech Comments:  NO EPISODES SINCE LAST CHECK ON 11-08-09.  Vella Kohler  December 06, 2009 4:25 PM  Impression & Recommendations:  Problem # 1:  WEAKNESS (ICD-780.79) Her weakness is much improved. Her fatigue persists, this following the discontinuation of her amiodarone. Her dizziness and confusion are  also much improved. We will resume her diltiazem at this time for help with rate control given her paroxysmal atrial fibrillation.  Problem # 2:  ATRIAL FIBRILLATION (ICD-427.31) paroxysmal AF-will resume diltiazem for rate control The following medications were removed from the medication list:    Amiodarone Hcl 400 Mg Tabs (Amiodarone hcl) .Marland Kitchen... , 1/2 tab by mouth daily    Warfarin Sodium 1 Mg Tabs (Warfarin sodium) ..... Use as directed by anticoagualtion clinic Her updated medication list for this problem includes:    Coumadin 5 Mg Tabs (Warfarin sodium) .Marland Kitchen... As directed  Problem # 3:  PACEMAKER, PERMANENT (ICD-V45.01) Device parameters and data were reviewed and no changes were made; no intercurrent episodes of atrial flutter ablated  Problem # 4:  HYPERTENSION (ICD-401.9) blood pressure adequately controlled on current medications; adding back diltiazem for atrial fibrillation The following medications were removed from the medication list:    Furosemide 20 Mg Tabs (Furosemide) .Marland Kitchen... Take one tablet by mouth-pt takes very rarely Her updated medication list for this problem includes:    Diltiazem Hcl 120 Mg Tabs (Diltiazem hcl) .Marland Kitchen... At bedtime    Hyzaar 50-12.5 Mg Tabs (Losartan potassium-hctz) .Marland Kitchen... Take 1 tablet by mouth once a day  Patient Instructions: 1)  Your physician wants you to follow-up in:   4 MONTHS You will receive a reminder letter in the mail two months in advance. If you don't receive a letter, please call our office to schedule the follow-up appointment. 2)  Your physician has recommended you make the following change in your medication: Start back on Diltiazem 120mg  one tablet everyday.

## 2010-07-11 NOTE — Assessment & Plan Note (Signed)
Summary: EC6/AMD   Visit Type:  new pt visit Primary [place]:  Dr. Marcello Fennel  CC:  pt sees Sherryl Manges to have her pacemaker checked....c/o sob..denies any cp or edema.  History of Present Illness: MsPerkins is a pleasant 77 yo woman with PMH of SSS, AV node ablation with dual chamber pacer, h/o HTN, presents for follow up of her sx of weakness and dizziness.   Dr. Graciela Husbands had suggested that she take her diltiazem at night to minimize the effects of hypotension. She felt worse with this change and has since changed back to taking diltiazem in the AM. She has not had an episode of weakness for some time and seems unconcerned about her episodes at this time. She does comment on having labile BP, and her daughter states that sometimes it is in the low 100s.   She is otherwise active with no complaints. She goes shopping frequently with no signs and overall feels well.   Problems Prior to Update: 1)  Dizziness  (ICD-780.4) 2)  Pacemaker, Permanent  (ICD-V45.01) 3)  Atrial Fibrillation  (ICD-427.31) 4)  Hypertension  (ICD-401.9)  Current Medications (verified): 1)  Hyzaar 50-12.5 Mg Tabs (Losartan Potassium-Hctz) .... Daily 2)  Synthroid 25 Mcg Tabs (Levothyroxine Sodium) .... Take One Tablet Once Daily 3)  Metoprolol Tartrate 25 Mg Tabs (Metoprolol Tartrate) .... Two Times A Day 4)  Diltiazem Hcl 120 Mg Tabs (Diltiazem Hcl) .... At Bedtime 5)  Methenamine Mandelate 1 Gm Tabs (Methenamine Mandelate) .... Daily 6)  Coumadin 5 Mg Tabs (Warfarin Sodium) .... As Directed 7)  Macrodantin 100 Mg Caps (Nitrofurantoin Macrocrystal) .... As Directed 8)  Zolpidem Tartrate 5 Mg Tabs (Zolpidem Tartrate) .... Take One Tablet At Bedtime 9)  Ester-C  Tabs (Bioflavonoid Products) .... Once Daily 10)  Omeprazole 20 Mg Cpdr (Omeprazole) .... Take One Capsule Once Daily  Allergies (verified): No Known Drug Allergies  Review of Systems  The patient denies anorexia, fever, weight loss, weight gain, vision  loss, decreased hearing, hoarseness, chest pain, syncope, dyspnea on exertion, peripheral edema, prolonged cough, headaches, hemoptysis, abdominal pain, melena, hematochezia, severe indigestion/heartburn, hematuria, incontinence, genital sores, muscle weakness, suspicious skin lesions, transient blindness, difficulty walking, depression, unusual weight change, abnormal bleeding, enlarged lymph nodes, angioedema, breast masses, and testicular masses.     Vital Signs:  Patient profile:   77 year old female Height:      63 inches Weight:      152 pounds Pulse rate:   62 / minute Pulse rhythm:   regular BP sitting:   144 / 80  (left arm) Cuff size:   large  Vitals Entered By: Danielle Rankin, CMA (July 20, 2009 4:10 PM)  Physical Exam  General:  HEENT is benign, neck is supple with no carotid bruits, no JVP, cv: RRR with no murmurs, resp: CTA b/l, ABD: benign, soft, NT,Extr: -c/e/c, neuro:  no focal deficts, pulses: equal and symmetrical upper and lower extremities.   EKG  Procedure date:  07/20/2009  Findings:      A V Paced rhythm, rate of 61 bpm.  PPM Specifications Following MD:  Sherryl Manges, MD     PPM Vendor:  [employer]     PPM Model Number:  ADDR01     PPM Serial Number:  [id number] PPM DOI:  11/27/2006     PPM Implanting MD:  Sherryl Manges, MD  Lead 1    Location: RA     DOI: 11/27/2006     Model #: 4098  Serial #: [id number]     Status: active Lead 2    Location: RV     DOI: 11/27/2006     Model #: 1478     Serial #: [id number]     Status: active  Magnet Response Rate:  BOL 85 ERI 65  Indications:  AFIB   PPM Follow Up Pacer Dependent:  No      Episodes Coumadin:  Yes  Parameters Mode:  MVP (R)     Lower Rate Limit:  60     Upper Rate Limit:  130 Paced AV Delay:  180     Sensed AV Delay:  140  Impression & Recommendations:  Problem # 1:  DIZZINESS (ICD-780.4) Etiology of her episodes of dizziness is uncertain though concerning for hypotension. I have  suggested that when she does not feel well, that she check her heart rate and BP. IF her rate is low, she can hold her losartan/hct, increase her by mouth fluids. She does take the HCTZ for edema though this has improved aftyer laser vein ablation surgery b/l. She will call us when she has an episode for further management if no improvement by holding losartan.   Problem # 2:  PACEMAKER, PERMANENT (ICD-V45.01) Followed by Dr. Graciela Husbands. Last eval on feb 1st, 2011.  Problem # 3:  ATRIAL FIBRILLATION (ICD-427.31) If she has paroxysmal a-fib that is not well controlled, we could d/c losartan to increase her metoprolol. Will leave this up to Dr. Graciela Husbands. uncertain if her episodes of weakness are associated with her periods of weakness. Perhaps she can keep a daily, or at least call the office when she does not feel well.  Her updated medication list for this problem includes:    Metoprolol Tartrate 25 Mg Tabs (Metoprolol tartrate) .Marland Kitchen..Marland Kitchen Two times a day    Coumadin 5 Mg Tabs (Warfarin sodium) .Marland Kitchen... As directed  Problem # 4:  HYPERTENSION (ICD-401.9) Labile BP per the patient with periods of hypotension. Can hold the losartan for low BP.  Her updated medication list for this problem includes:    Hyzaar 50-12.5 Mg Tabs (Losartan potassium-hctz) .Marland Kitchen... Daily    Metoprolol Tartrate 25 Mg Tabs (Metoprolol tartrate) .Marland Kitchen..Marland Kitchen Two times a day    Diltiazem Hcl 120 Mg Tabs (Diltiazem hcl) .Marland Kitchen... At bedtime

## 2010-07-11 NOTE — Assessment & Plan Note (Signed)
Summary: rov/jml   Visit Type:  Follow-up Primary Provider:  Dr. Marcello Fennel   History of Present Illness: Kathryn Short is seen as an add-on today because of episodes of dizziness and confusion.There is also does have issues with loss of balance.  She has a history of atrial fibrillation with prior pacemaker implantation.  Because of the dorsi of the symptoms last Friday she ended up at [facility] emergency room. A CT scan was negative for bleeding her laboratories were pretty normal except for a somewhat low INR  she [facility] we await those results   .  She has no history of coronary disease. She recalls having had a stress test at [facility]; we are unable to find records.     Current Medications (verified): 1)  Synthroid 25 Mcg Tabs (Levothyroxine Sodium) .... Take One Tablet Once Daily 2)  Metoprolol Tartrate 25 Mg Tabs (Metoprolol Tartrate) .... Two Times A Day 3)  Diltiazem Hcl 120 Mg Tabs (Diltiazem Hcl) .... At Bedtime 4)  Methenamine Mandelate 1 Gm Tabs (Methenamine Mandelate) .... Daily 5)  Coumadin 5 Mg Tabs (Warfarin Sodium) .... As Directed 6)  Macrodantin 100 Mg Caps (Nitrofurantoin Macrocrystal) .... As Directed 7)  Zolpidem Tartrate 5 Mg Tabs (Zolpidem Tartrate) .... Take One Tablet At Bedtime 8)  Ester-C  Tabs (Bioflavonoid Products) .... Once Daily 9)  Digoxin 0.125 Mg Tabs (Digoxin) .... Take A Half  Tablet By Mouth Every 8 Hours For 24 Hours Then Daily 10)  Amiodarone Hcl 400 Mg Tabs (Amiodarone Hcl) .... Take One Tablet By Mouth Twice A Day For 14 Days, Then Take One Tablet By Mouth Daily For 1 Month, Then 1/2 Tab By Mouth Daily 11)  Furosemide 20 Mg Tabs (Furosemide) .... Take One Tablet By Mouth Daily. 12)  Hyzaar 50-12.5 Mg Tabs (Losartan Potassium-Hctz) .... Take 1 Tablet By Mouth Once A Day 13)  Warfarin Sodium 1 Mg Tabs (Warfarin Sodium) .... Use As Directed By Anticoagualtion Clinic  Allergies (verified): No Known Drug  Allergies  Past History:  Past Medical History: Last updated: 01/22/2009 atrial fibrillation Pacemaker Implantation-Medtronic  Hypertension Obstructive sleep apnea on BiPAP Gastroesophageal reflux disease Uterine cancer status post radiation therapy back in 1952 Hypothyroidism, treated Melanoma resected from her back Total abdominal hysterectomy and bilateral salpingo-oophorectomy  Colostomy for perforation. Cholecystectomy. Cystoscopy     Vital Signs:  Patient profile:   77 year old female Height:      63 inches Weight:      151 pounds BMI:     26.85 Pulse rate:   67 / minute BP sitting:   100 / 60  (left arm)  Vitals Entered By: Kathryn Short CMA (Oct 13, 2009 1:55 PM)  Physical Exam  General:  The patient was alert and oriented in no acute distress. HEENT Normal.  Neck veins were flat, carotids were brisk.  Lungs were clear.  Heart sounds were regular without murmurs or gallops.  Abdomen was soft with active bowel sounds. There is no clubbing cyanosis or edema. Skin Warm and dry Neuro exam demonstrated a negative Romberg, normal finger-nose and no drift    EKG  Procedure date:  10/13/2009  Findings:      atrial paced rhythm at 67 Intervals 0.26/0.010/0.48  PPM Specifications Following MD:  Sherryl Manges, MD     PPM Vendor:  Medtronic     PPM Model Number:  ADDR01     PPM Serial Number:  [id number] DOI:  11/27/2006  PPM Implanting MD:  Sherryl Manges, MD  Lead 1    Location: RA     DOI: 11/27/2006     Model #: [id number]     Serial #: [id number]     Status: active Lead 2    Location: RV     DOI: 11/27/2006     Model #: 9562     Serial #: [id number]     Status: active  Magnet Response Rate:  BOL 85 ERI 65  Indications:  AFIB   PPM Follow Up Pacer Dependent:  No      Episodes Coumadin:  Yes  Parameters Mode:  MVP (R)     Lower Rate Limit:  60     Upper Rate Limit:  130 Paced AV Delay:  180     Sensed AV Delay:  140  Impression &  Recommendations:  Problem # 1:  ORTHOSTATIC DIZZINESS (ICD-780.4) She continues to have some dizziness and balance issues. This was previously attributed to her atrial fibrillation and her AV nodal blocking agents. her blood pressure today is some better at 100/60. This had been a problem before. I wonder whether it has something to do with her amiodarone and we will decrease it to 200 mg a day at this juncture with intentions at her next visit to decrease to 200 mg a day.  because of the low blood pressure I will plan to go ahead and discontinue her metoprolol at this juncture;  if blood pressure remains low at her next visit we will discontinue her diltiazem  Problem # 2:  PACEMAKER, PERMANENT (ICD-V45.01) Device parameters and data were reviewed and no changes were made  Problem # 3:  ATRIAL FIBRILLATION (ICD-427.31)  she had little atrial fibrillation her last visit and  none in the last 10 days The following medications were removed from the medication list:    Metoprolol Tartrate 25 Mg Tabs (Metoprolol tartrate) .Marland Kitchen..Marland Kitchen Two times a day    Digoxin 0.125 Mg Tabs (Digoxin) .Marland Kitchen... Take a half  tablet by mouth every 8 hours for 24 hours then daily Her updated medication list for this problem includes:    Coumadin 5 Mg Tabs (Warfarin sodium) .Marland Kitchen... As directed    Amiodarone Hcl 400 Mg Tabs (Amiodarone hcl) .Marland Kitchen... , 1/2 tab by mouth daily    Warfarin Sodium 1 Mg Tabs (Warfarin sodium) ..... Use as directed by anticoagualtion clinic  Patient Instructions: 1)  Your physician recommends that you schedule a follow-up appointment in: 4 weeks in [place]  2)  Your physician has recommended you make the following change in your medication: stop Metoprolol and Digoxin and decrease Amiodarone to 200mg  daily

## 2010-07-11 NOTE — Assessment & Plan Note (Signed)
Summary: pt walk in for bp check  Nurse Visit   Vital Signs:  Patient profile:   77 year old female Pulse rate:   60 / minute Resp:     20 per minute BP sitting:   164 / 74  (left arm)  Vitals Entered By: Meredith Staggers, RN (September 20, 2009 11:43 AM) Comments Pt walked in c/o elevated BP, brought home readings which range 140-170s/80-90s, pt c/o chest tightness and heaviness when BP is elevated, discussed info w/Dr Graciela Husbands he wants pt to restart hyzaar at previous dose.  Pt and daughter aware and verbalizes understanding, will resume med and cont. to monitor BP will c/b if BP too high or low or if chest pain comes back. Meredith Staggers, RN  September 20, 2009 12:22 PM    Current Medications (verified): 1)  Synthroid 25 Mcg Tabs (Levothyroxine Sodium) .... Take One Tablet Once Daily 2)  Metoprolol Tartrate 25 Mg Tabs (Metoprolol Tartrate) .... Two Times A Day 3)  Diltiazem Hcl 120 Mg Tabs (Diltiazem Hcl) .... At Bedtime 4)  Methenamine Mandelate 1 Gm Tabs (Methenamine Mandelate) .... Daily 5)  Coumadin 5 Mg Tabs (Warfarin Sodium) .... As Directed 6)  Macrodantin 100 Mg Caps (Nitrofurantoin Macrocrystal) .... As Directed 7)  Zolpidem Tartrate 5 Mg Tabs (Zolpidem Tartrate) .... Take One Tablet At Bedtime 8)  Ester-C  Tabs (Bioflavonoid Products) .... Once Daily 9)  Omeprazole 20 Mg Cpdr (Omeprazole) .... Take One Capsule Once Daily 10)  Digoxin 0.125 Mg Tabs (Digoxin) .... Take A Half  Tablet By Mouth Every 8 Hours For 24 Hours Then Daily 11)  Amiodarone Hcl 400 Mg Tabs (Amiodarone Hcl) .... Take One Tablet By Mouth Twice A Day For 14 Days, Then Take One Tablet By Mouth Daily For 1 Month, Then 1/2 Tab By Mouth Daily 12)  Furosemide 20 Mg Tabs (Furosemide) .... Take One Tablet By Mouth Daily.  Allergies (verified): No Known Drug Allergies   Patient Instructions: 1)  Restart Hyzaar 50/12.5mg  daily 2)  Continue to monitor BP 3)  Keep follow-up appt with Dr Graciela Husbands 10/13/09

## 2010-07-11 NOTE — Cardiovascular Report (Signed)
Summary: Office Visit   Office Visit   Imported By: Roderic Ovens 11/22/2009 11:00:43  _____________________________________________________________________  External Attachment:    Type:   Image     Comment:   External Document

## 2010-07-11 NOTE — Progress Notes (Signed)
Summary: [facility]   Imported By: Harlon Flor 09/23/2009 12:23:18  _____________________________________________________________________  External Attachment:    Type:   Image     Comment:   External Document

## 2010-07-11 NOTE — Cardiovascular Report (Signed)
Summary: Office Visit   Office Visit   Imported By: Roderic Ovens 07/13/2009 16:00:59  _____________________________________________________________________  External Attachment:    Type:   Image     Comment:   External Document

## 2010-07-11 NOTE — Miscellaneous (Signed)
  Clinical Lists Changes  Observations: Added new observation of ECHOINTERP: LV function normal EF 65% Moderate MR, mild TR and Al Mild pulmonary hypertension No AS  (10/12/2009 12:37)      Echocardiogram  Procedure date:  10/12/2009  Findings:      LV function normal EF 65% Moderate MR, mild TR and Al Mild pulmonary hypertension No AS

## 2010-07-11 NOTE — Progress Notes (Signed)
Summary: elevated BP  Phone Note Call from Patient Call back at Home Phone [phone]   Caller: Daughter Reason for Call: Talk to Nurse Summary of Call: elevated bp, seen in [place] [facility] today, very concerned, request call back Initial call taken by: Migdalia Dk,  September 19, 2009 4:48 PM  Follow-up for Phone Call        pt was a walkin at [facility] on 09/19/09 @ approx. 2:00p.  Pt stated that  she needed to be seen immediately because of her BP.  Receptionist checked BP via dinamap and BP was 183/74.  Pt had recently been in office with hypotension secondary to afib and was seen by Dr. Graciela Husbands.  Per Dr. Ethelene Hal last office note, because of pts orthostatic hypotension, pt would need to tolerate some hypertension. This was relayed to pt by MD.  Information was relayed to Dr. Shirlee Latch who was DOD and Dr. Shirlee Latch stated that BP was ok given history- pt to go home and rest.  Pt stated that she was going to ED for evaluation.  Dr. Shirlee Latch advised that this was not necessary.  Pt left.  Shortly afterwards, office rec'd a call from Dr. Marcello Fennel that pt was in his office for evaluation.  Per Dr. Saunders Glance office note, pt was advised to stop amio and was set up with second opinion with Dr. Lady Gary.   Follow-up by: Charlena Cross, RN, BSN,  September 20, 2009 12:20 PM  Additional Follow-up for Phone Call Additional follow up Details #1::        See nurse visit from today. Gypsy Balsam RN BSN  September 20, 2009 1:57 PM

## 2010-07-11 NOTE — Cardiovascular Report (Signed)
Summary: Office Visit   Office Visit   Imported By: Roderic Ovens 10/28/2009 16:20:56  _____________________________________________________________________  External Attachment:    Type:   Image     Comment:   External Document

## 2010-08-04 ENCOUNTER — Encounter: Payer: Self-pay | Admitting: Internal Medicine

## 2010-08-04 ENCOUNTER — Encounter (INDEPENDENT_AMBULATORY_CARE_PROVIDER_SITE_OTHER): Payer: Medicare Other | Admitting: Internal Medicine

## 2010-08-04 DIAGNOSIS — R05 Cough: Secondary | ICD-10-CM

## 2010-08-04 DIAGNOSIS — I498 Other specified cardiac arrhythmias: Secondary | ICD-10-CM

## 2010-08-04 DIAGNOSIS — Z950 Presence of cardiac pacemaker: Secondary | ICD-10-CM

## 2010-08-04 DIAGNOSIS — I2589 Other forms of chronic ischemic heart disease: Secondary | ICD-10-CM

## 2010-08-08 NOTE — Assessment & Plan Note (Signed)
Summary: f19m/amd   Visit Type:  Follow-up Primary Provider:  Dr. Marcello Fennel  CC:  "Doing well.".  History of Present Illness: Kathryn Short is seen in followup for atrial fibrillation and bradycardia with prior pacemaker implantation.    She had done great on her current meds  The patient denies SOB, chest pain, edema or palpitations  His headache cough for the last 2 or 3 weeks mildly productive but unassociated with fever  2010 ultrasonography at [facility] demonstrating normal left ventricular function with moderate MR   She has no history of coronary disease. She recalls having had a stress test at [facility]; we are unable to find records.     Current Medications (verified): 1)  Synthroid 50 Mcg Tabs (Levothyroxine Sodium) .... Take 1 Tablet By Mouth Once A Day 2)  Diltiazem Hcl 120 Mg Tabs (Diltiazem Hcl) .... At Bedtime 3)  Coumadin 4 Mg Tabs (Warfarin Sodium) .... Take As Directed By Pcp 4)  Hyzaar 50-12.5 Mg Tabs (Losartan Potassium-Hctz) .... Take 1 Tablet By Mouth Once A Day 5)  Tmp/smz Ds .Marland KitchenMarland Kitchen. 160-800 Mg 1 Once Daily 6)  Multivitamins   Tabs (Multiple Vitamin) .... Once Daily 7)  Vitamin B Complex-C   Caps (B Complex-C) .... Once Daily 8)  Melatonin 300 Mcg Tabs (Melatonin) .Marland Kitchen.. 1-2 At Bedtime 9)  Probiotic  Allergies (verified): No Known Drug Allergies  Past History:  Past Medical History: Last updated: 01/22/2009 atrial fibrillation Pacemaker Implantation-Medtronic  Hypertension Obstructive sleep apnea on BiPAP Gastroesophageal reflux disease Uterine cancer status post radiation therapy back in 1952 Hypothyroidism, treated Melanoma resected from her back Total abdominal hysterectomy and bilateral salpingo-oophorectomy  Colostomy for perforation. Cholecystectomy. Cystoscopy     Past Surgical History: Last updated: 01/22/2009 Total hysterectomy Implantation of Pacemaker-medtronic  Family History: Last updated: 01/22/2009 noncontributory  Social History: Last  updated: 01/22/2009 Widowed-5 children Tobacco Use - No.  Alcohol Use - no  Risk Factors: Smoking Status: never (01/22/2009)  Vital Signs:  Patient profile:   77 year old female Height:      63 inches Weight:      154 pounds BMI:     27.38 Pulse rate:   74 / minute BP sitting:   120 / 60  (left arm) Cuff size:   regular  Vitals Entered By: Bishop Dublin, CMA (August 04, 2010 11:12 AM)  Physical Exam  General:  The patient was alert and oriented in no acute distress. HEENT Normal.  Neck veins were flat, carotids were brisk.  Lungs were clear.  Heart sounds were regular without murmurs or gallops.  Abdomen was soft with active bowel sounds. There is no clubbing cyanosis or edema. Skin Warm and dry    PPM Specifications Following MD:  Sherryl Manges, MD     PPM Vendor:  Medtronic     PPM Model Number:  ADDR01     PPM Serial Number:  [id number] PPM DOI:  11/27/2006     PPM Implanting MD:  Sherryl Manges, MD  Lead 1    Location: RA     DOI: 11/27/2006     Model #: [id number]     Serial #: [id number]     Status: active Lead 2    Location: RV     DOI: 11/27/2006     Model #: [id number]     Serial #: [id number]     Status: active  Magnet Response Rate:  BOL 85 ERI 65  Indications:  AFIB   PPM Follow Up Pacer Dependent:  No      Episodes Coumadin:  Yes  Parameters Mode:  MVP (R)     Lower Rate Limit:  60     Upper Rate Limit:  130 Paced AV Delay:  180     Sensed AV Delay:  140  Impression & Recommendations:  Problem # 1:  ATRIAL FIBRILLATION (ICD-427.31)  Her updated medication list for this problem includes:    Coumadin 4 Mg Tabs (Warfarin sodium) .Marland Kitchen... Take as directed by pcp  Her updated medication list for this problem includes:    Coumadin 4 Mg Tabs (Warfarin sodium) .Marland Kitchen... Take as directed by pcp  Problem # 2:  SINUS NODE DYSFUNCTION (ICD-427.81)  he is atrially paced 35% of the time Her updated medication list for this problem includes:    Diltiazem Hcl 120 Mg Tabs  (Diltiazem hcl) .Marland Kitchen... At bedtime    Coumadin 4 Mg Tabs (Warfarin sodium) .Marland Kitchen... Take as directed by pcp  Her updated medication list for this problem includes:    Diltiazem Hcl 120 Mg Tabs (Diltiazem hcl) .Marland Kitchen... At bedtime    Coumadin 4 Mg Tabs (Warfarin sodium) .Marland Kitchen... Take as directed by pcp  Problem # 3:  PACEMAKER, PERMANENT (ICD-V45.01) Device parameters and data were reviewed and no changes were made  Problem # 4:  COUGH (ICD-786.2)  we will give her a Z-Pak. Her updated medication list for this problem includes:    Diltiazem Hcl 120 Mg Tabs (Diltiazem hcl) .Marland Kitchen... At bedtime    Coumadin 4 Mg Tabs (Warfarin sodium) .Marland Kitchen... Take as directed by pcp  Her updated medication list for this problem includes:    Diltiazem Hcl 120 Mg Tabs (Diltiazem hcl) .Marland Kitchen... At bedtime    Coumadin 4 Mg Tabs (Warfarin sodium) .Marland Kitchen... Take as directed by pcp  Patient Instructions: 1)  Your physician recommends that you schedule a follow-up appointment in: 6 months 2)  Your physician has recommended you make the following change in your medication. START Z pack as directed.  Prescriptions: ZITHROMAX 1 GM PACK (AZITHROMYCIN) Take one tablet once daily  #1 pack x 0   Entered by:   Lanny Hurst RN   Authorized by:   Nathen May, MD, [facility]   Signed by:   Lanny Hurst RN on 08/04/2010   Method used:   Electronically to        [facility] [phone]* (retail)       [address] [place], [place]  [address]       Ph: [phone]       Fax: [phone]   RxID:   [id number]

## 2010-08-22 ENCOUNTER — Ambulatory Visit: Payer: Self-pay | Admitting: Internal Medicine

## 2010-10-24 NOTE — Letter (Signed)
December 09, 2006    Dr. Corliss Parish  [address]  [place], [place] [address]   RE:  Kathryn Short  MRN:  [id number]  /  DOB:  March 12, 1922   Dear Kathryn Short:   Kathryn Short comes in today to get her wound checked after a pacemaker.  She is feeling terrific.  She says she is sleeping well.  Her energy  level is better.   Her current medications include metoprolol 25 b.i.d., Cardizem 120 mg  and her other medications are unchanged.   Her wound looked really good.  I took off the Steri-Strips.  Her heart  sounds were regular.   We will plan to see her again in about 10 weeks' time to further  reprogram the device.  At this point, no other medication changes were  made.   If there is anything further I can do, please do not hesitate to contact  me.    Sincerely,      Kathryn Salvia, MD, [facility]  Electronically Signed    SCK/MedQ  DD: 12/09/2006  DT: 12/10/2006  Job #: [id number]

## 2010-10-24 NOTE — H&P (Signed)
[facility] ADMISSION   NAME:Short, [name]                      MRN:          [id number]  DATE:11/20/2006                            DOB:          June 13, 1921    The patient was seen with Dr. Berton Mount today.   This is a very pleasant 77 year old widowed white female patient that  Dr. Graciela Husbands saw back in December who was in atrial fibrillation with some  bradycardia.  He at that time recommended a stress Myoview and auto  directional atrial fibrillation monitor. The patient had an  echocardiogram performed July 10, 2006, that showed normal LV  systolic function, ejection fraction 50%, mild LVH, mild aortic and  tricuspid regurgitation with moderate MR. They did not send [place] a copy of  her stress test, but the daughter tells [place] it was normal.  She now  presents with a 74-month history of worsening dizziness.  She says it  happens approximately 3 times a week.  It can occur when sitting or  standing, and things start spinning around her, and she is almost to the  point of passing out, but then it improves.  She says her heart beat is  very slow during this, and then it clears up.  She was in the emergency  room 3 weeks ago for rapid atrial fibrillation at a rate of 180, treated  with Cardizem and metoprolol.  She was in the ER for 3 hours.  She was  referred here today for possible pacemaker placement.  A 48-hour monitor  done showed bradycardia in the 40s with some SVT up to 150 beats per  minute.  She had two 15-beat runs.   ALLERGIES:  ACE INHIBITOR, ARB cause cough.  She does not tolerate  FOSAMAX.   CURRENT MEDICATIONS:  1. Coumadin 5 mg as directed.  2. Calcium 600 mg.  3. Synthroid 0.025 mg daily.  4. Cardizem CD 120 mg daily.  5. Prilosec 20 mg daily.  6. Metoprolol 25 mg daily.  7. Lunesta 1 mg nightly p.r.n.  8. Levaquin 500 mg daily.  9. Hyzaar 50/12 daily.   PAST MEDICAL HISTORY:   Significant for:  1. Hypertension.  2. Obstructive sleep apnea on BiPAP.  3. Gastroesophageal reflux disease.  4. Uterine cancer status post radiation therapy back in 1952.  5. Hypothyroidism, treated.  6. Melanoma resected from her back.  7. Total abdominal hysterectomy and bilateral salpingo-oophorectomy.  8. Colostomy for perforation.  9. Cholecystectomy.  10.Cystoscopy.   SOCIAL HISTORY:  She is widowed.  She has five children.  She lives  alone, but her daughter lives on one side of her and her granddaughter  on the other side.  She denies alcohol or cigarette use or abuse.   FAMILY HISTORY:  Insignificant.   REVIEW OF SYSTEMS:  Significant for dizziness and presyncopal signs and  symptoms.  No dyspepsia, dysphagia, nausea, vomiting, change in bowels,  melena.  CARDIOPULMONARY:  See HPI. She does have some lower extremity  edema that improved a little bit when Cozaar was changed to Hyzaar,  but  she does not keep her legs elevated during the day.   PHYSICAL EXAMINATION:  GENERAL:  This is a very pleasant, young-looking,  77 year old white female in no acute distress.  VITAL SIGNS:  Blood pressure 128/76, pulse 57, weight 156.  HEENT:  Head is normocephalic and atraumatic.  Extraocular movements  intact.  Pupils equal, round, and reactive to light and accommodation.  Nasal mucosa moist.  Throat without erythema or exudate. She does have  bilateral hearing aids in place.  NECK:  Without JVD or thyroid enlargement.  LUNGS:  Clear anterior, posterior, and lateral.  HEART:  Regular rate and rhythm at 60 beats per minute.  Normal S1 and  S2, mid systolic click left sternal border and a 1 to 2/6 systolic  murmur at the apex.  ABDOMEN:  Soft, without organomegaly, mass, lesions, or abnormal  tenderness.  EXTREMITIES:  Trace edema, left ankle greater than right.  Positive  distal pulses.  NEUROLOGIC:  Without focal deficit.   EKG:  Sinus bradycardia at 57 beats per minute,  otherwise no acute  change.   IMPRESSION:  1. Sick sinus syndrome with paroxysmal atrial fibrillation with rapid      ventricular rate as well as significant bradycardia, referred for      pacemaker.  2. Hypertension.  3. History of melanoma resected from her back.  4. Gastroesophageal reflux disease.  5. Chronic lower extremity edema secondary to venous stasis changes.  6. Obstructive sleep apnea on BiPAP.  7. Uterine cancer status post radiation therapy.  8. Hypothyroidism, treated.   PLAN:  At this time, Dr. Graciela Husbands has recommended pacemaker.  He has  explained the risks and benefits of this including infection, lead wire  movement, and perforation. The patient agrees and  wants to proceed.  This will be done in [place] next Wednesday.  She  is on Coumadin, so this will need to be held.      Jacolyn Reedy, PA-C  Electronically Signed      Duke Salvia, MD, [facility]  Electronically Signed   ML/MedQ  DD: 11/20/2006  DT: 11/20/2006  Job #: [id number]   cc:   Corliss Parish

## 2010-10-24 NOTE — Assessment & Plan Note (Signed)
[facility] OFFICE NOTE   NAME:Kathryn Short, Kathryn Short                      MRN:          [id number]  DATE:02/04/2007                            DOB:          15-Mar-1922    HISTORY OF PRESENT ILLNESS:  Ms. Tirone is an 77 year old woman with a  history of atrial fibrillation and sick sinus syndrome with symptomatic  bradycardia. She recently underwent pacemaker placement by Dr. Berton Mount. This was a complicated procedure, as she had dislodgement of both  leads and spent nearly 3 1/2 hours in the ET lab. She now says that she  feels much better. Her pacer site was sore for a while but is finally  getting more comfortable. She has not had any fevers, no hematoma, or  bruising. The remainder of her past medical history is notable for  hypertension, obstructive sleep apnea on BIPAP, history of melanoma and  hyperlipidemia. She had an echocardiogram performed in January that  showed an ejection fraction of 50% with mild LVH, mild aortic  insufficiency, and tricuspid insufficiency and moderate MR. She also had  a stress test previously in the recent past that was normal, according  to her family. Overall, she is doing quite well. Denies any palpitations  or significant dyspnea. She does have chronic lower extremity edema,  secondary to venous insufficiency. She has not had any syncope.   CURRENT MEDICATIONS:  Coumadin, Synthroid 25 mcg daily, Cardizem CD 120  daily, Prilosec 20 daily, Lunesta, Hyzaar 50/12.5, Metoprolol 25 bid,  calcium, and iron.   ALLERGIES:  She has a cough with ace inhibitor.   PHYSICAL EXAMINATION:  GENERAL:  An elderly woman in no acute distress.  She is hard of hearing and wears hearing aids.  LUNGS:  Ambulates around the clinic without any respiratory difficulty.  VITAL SIGNS:  Blood pressure 138/68, heart rate 60, weight 154.  HEENT:  Normal with some mild xanthelasma.  NECK:  Supple. JVP is  about 6 cm or water. Carotids are 2+ bilaterally  without bruits. There is no lymphadenopathy or thyromegaly.  CARDIAC:  PMI is non-displaced. She has a regular rate and rhythm with  no murmur, rub, or gallop. Pacemaker site looks well healed.  LUNGS:  Clear.  ABDOMEN:  Obese, nontender, and nondistended. No hepatosplenomegaly, no  bruits, no masses.  EXTREMITIES:  Warm with no cyanosis or clubbing. She does have  significant venous varicosities and trace edema throughout. No rash.  NEUROLOGIC:  Alert and oriented times three. Cranial nerves 2-12 are  grossly intact except for her hearing. Moves all 4 extremities without  difficulty.   DIAGNOSTIC STUDIES:  EKG is pending.   ASSESSMENT/PLAN:  1. Sick sinus syndrome with atrial fibrillation. She is status post      pacemaker and doing well. She will continue Coumadin.  2. Hypertension. Blood pressure is elevated today. Increase her Hyzaar      to 100/25 and check a B-met next week. She will followup with Dr.      Dellis Filbert.  3. Hyperlipidemia. Followed by Dr. Dellis Filbert.   DISPOSITION:  Will see her back in clinic for routine followup in 4  moths.     Bevelyn Buckles. Bensimhon, MD  Electronically Signed    DRB/MedQ  DD: 02/04/2007  DT: 02/04/2007  Job #: [id number]   cc:   Corliss Parish

## 2010-10-24 NOTE — Letter (Signed)
September 23, 2007    Corliss Parish, MD  [address]  [place], [place] [address]   RE:  Kathryn, Short  MRN:  [id number]  /  DOB:  Sep 17, 1921   Dear Kathryn Short,   Kathryn Short comes in today following pacemaker implantation for tachy-  brady syndrome.  She is sure that she has not been in the hospital since  her pacemaker was implanted.  She is aware that she has had progressive  swelling and that her weight is up.  She recalls the Lasix that we have  given her before.  After three days of continuous Lasix, she was quite  washed out and drained, and I do not know whether she had her potassium  measured at that point or not.  Her Cozaar had also been changed in the  interim to Hyzaar.   Her other medications include metoprolol 25 b.i.d., Cardizem 120,  Coumadin, Lunesta.   PHYSICAL EXAMINATION:  Her blood pressure today was 140/80.  Her pulse  was 76.  Her neck veins were 7-8 cm.  LUNGS:  Clear.  CARDIAC:  Heart sounds were regular with a early systolic murmur, a loud  S4.  ABDOMEN:  Soft.  EXTREMITIES:  Peripheral edema 2+, left greater than right.   Interrogation of her Medtronic pacemaker demonstrates that she is in  sinus rhythm with a P wave of 4 and an impedance of 447 with a threshold  of 0.7 at 0.4 in both chambers.  The R wave 5.6 with an impedance of  416.  Battery voltage is 2.78.  She is atrial-paced 83% of the time and  in atrial fibrillation about 1.4% of the time.   IMPRESSION:  1. Paroxysmal atrial fibrillation with bradycardia.  2. Status post pacemaker requiring lead revision for the above.  3. Peripheral edema, questionable fluid overload, question Cardizem.  4. Hypertension.   Kathryn Short, Kathryn Short' edema is quite striking and also somewhat limiting.  I have taken the liberty, as I mentioned on the voice mail, to put her  on Lasix at 20 mg every other day accompanied by potassium 20 mEq every  other day.  I have asked that she follow up with you in the next week to  10 days to get her potassium, creatinine, and magnesium checked.   I have also taken the liberty of discontinuing her Cardizem, as it may  be contributing to the edema as well, and she will need her blood  pressure checked with you as you follow her along regularly.   We will plan to see her again in six months.  If there is anything I can  do in the interim, please do not hesitate to contact me.    Sincerely,      Duke Salvia, MD, [facility]  Electronically Signed    SCK/MedQ  DD: 09/23/2007  DT: 09/23/2007  Job #: [id number]

## 2010-10-24 NOTE — Letter (Signed)
March 12, 2007    Dr. Corliss Parish  [address] [place], [place] [address]   RE:  Kathryn Short, Kathryn Short  MRN:  [id number]  /  DOB:  03/04/1922   [name],   I hope this letter finds you well.  I hope to get by to see you next  week.   Kathryn Short comes in following revision of her pacemaker, with which  she has done quite well.  Her energy level is much improved, and she is  feeling really pretty good at this point.  Her major complaint is of  peripheral edema, which does not seem to be ameliorated adequately by  the change from Cozaar to Hyzaar.   Her other medications include the syncope, Cardizem, which may be  contributing to her edema, Prilosec, Lunesta, ferrous sulfate, and  Coumadin.   PHYSICAL EXAMINATION:  On examination today, her blood pressure is  131/78, pulse 60.  LUNGS:  Clear.  CARDIAC:  Heart sounds were regular.  EXTREMITIES:  Edema, 1-2+.   Interrogation of her Medtronic pacemaker demonstrated that she was in a  nonresponsive mode and that she was atrial paced about 60% of the time  and atrial sensed about 40% of the time, but most of her atrial-sensed  events were actually pretty slow.  Because of that, her rate response  algorithm was activated.   We also reprogrammed her to allow for intrinsic conduction.   IMPRESSION:  1. Tachy-brady syndrome with paroxysmal atrial fibrillation.  2. Chronotrophic incompetence.  3. Status post pacemaker with need for lead revision.  4. Peripheral edema.   John, Kathryn Short is doing really pretty well.  I have given her a  prescription for Lasix 20 to take a couple of times a week as needed for  significant peripheral edema.  She is to follow up with you as  previously scheduled, and we will see her in June.  She is to call us in  the interim if she has any issues.    Sincerely,      Duke Salvia, MD, [facility]  Electronically Signed    SCK/MedQ  DD: 03/12/2007  DT: 03/12/2007  Job #: [id number]

## 2010-10-24 NOTE — Letter (Signed)
March 22, 2008    Kathryn Parish, MD  [facility]  [address]  [place], [place] [address]   RE:  Kathryn, Short  MRN:  [id number]  /  DOB:  Oct 28, 1921   Dear Kathryn Short,   Kathryn Short comes in today in followup for bradycardia and status post  pacemaker implantation.  She has paroxysmal atrial fibrillation.  She  continues to complain of peripheral edema and some shortness of breath.   When I saw her last April, I had suggested that we try a little bit of  infrequent Lasix as well as decreasing her diltiazem to see if we could  abet the situation.  She did not do that.   She does note today, however, that she takes a fair amount of fluid as  recommend by her urologist for her recurrent UTIs as well as a fair  amount of salt in her diet.   Her medications are notable for her being on Hyzaar 50/12.5, metoprolol  25 a day, diltiazem 120, and Coumadin.   PHYSICAL EXAMINATION:  VITAL SIGNS:  Blood pressure today was 180/50  with the pulse of 60.  LUNGS:  Clear.  NECK:  Her neck veins were flat.  HEART:  Sounds were regular.  EXTREMITIES:  Edema 1 to 2+.   Interrogation of her Medtronic pulse generator demonstrates that she has  atrial fibrillation rather infrequently about 1.2% of the time.  Her P-  wave was 4, the impedance of 423, threshold of 0.75 at 0.4.  The R-wave  was 5.6, impedance of 424, threshold 0.75 at 0.4.  Battery voltage was  2.79.  Her atrial fibrillation when it occurs is quite rapid.   IMPRESSION:  1. Paroxysmal atrial fibrillation with bradycardia.  2. Status post pacer for the above.  3. Peripheral edema.   Kathryn Short, I had suggested that in anticipation seeing you next month that we  have her decrease her salt intake, we have her decrease her water  intake, and we have her decrease her diltiazem.  Let us see how that  does in terms of helping with some of her edema.   I will see her again in 6 months' time.  If there is anything I can do  in the interim,  please do not hesitate to contact me.    Sincerely,      Kathryn Salvia, MD, [facility]  Electronically Signed    SCK/MedQ  DD: 03/22/2008  DT: 03/23/2008  Job #: [id number]

## 2010-10-24 NOTE — Letter (Signed)
January 20, 2007    Corliss Parish  [address] [place], [place] [address]   RE:  MANIKA, HAST  MRN:  [id number]  /  DOB:  [birth year]   [name]:   I hope this letter finds you well.  Ms. Yinger comes in today because  she is complaining of thumping on her right side of her chest.  As you  recall, she had a pacemaker implantation for tachybrady syndrome with  paroxysmal atrial fibrillation and sinus node dysfunction.  This went  well.  This thumping started about a week and a half ago and is right-  sided.  Chest x-ray demonstrated retraction of the right ventricular  lead to the mid right atrium.   I have turned this lead off.   Her medications are unchanged.  On examination, vital signs were stable,  her pulse was 62, lungs were clear, heart sounds were regular, the wound  was well healed and the extremities were without edema.   Interrogation of her Medtronic Adapta pulse generator demonstrated a P  wave of 4 with an impedance of 518.  There was no ventricular capture or  sensing.   IMPRESSION:  1. Paroxysmal atrial fibrillation with sinus node dysfunction.  2. Status post pacer for the above, now with right ventricular lead      dislodgement.  3. Hypertension.   Ms. Iveliz, Garay, will need to have her right ventricular lead removed  and replaced.  There is likely significant end-growth into the screw.  At this point, it should be fairly easy to remove that lead and would  simply replace it.   I have reviewed this with her, we will plan to do it just as quickly as  we can, to arrange it at [facility].   Thanks very much for allowing [place] to participate in her care.    Sincerely,      Duke Salvia, MD, [facility]  Electronically Signed    SCK/MedQ  DD: 01/20/2007  DT: 01/20/2007  Job #: [id number]

## 2010-10-27 NOTE — Letter (Signed)
May 29, 2006    Corliss Parish  [address] [place], [place] [address]   RE:  KALYNA, PAOLELLA  MRN:  [id number]  /  DOB:  04-24-1922   [name]:   I hope this letter finds you well.  Thank you very much for asking [place] to  see Onell Mcmath because of her paroxysms of atrial fibrillation, which  is associated with rapid ventricular response and caused hospitalization  a couple of times for the last couple of years.   She is an elderly woman at 60 whose atrial fibrillation history goes  back probably back to 2003 when she was found to have atrial  fibrillation.  It apparently recurred in 2004, 2005, and then again  recently.  These were associated with a rapid ventricular response.  She  recalls heart rates in the 150 to 200 range.  They are associated with  weakness, chest pain, and light-headedness, but she does not say  shortness of breath.  She was also admitted for chest pain in 2006 and  underwent a negative stress echo.   She comes in now because of your concerns, as well as her own concerns,  about the variability in care.  She recalls having been given  nitroglycerin and almost passed out.   Her thromboembolic risk factors are notable for:  1. Hypertension.  2. Age.  3. Possible diabetes (hemoglobin A1C was 5.9 at a recent evaluation      with an elevated non-fasting sugar of 150).   She notes increasing problems with exertional chest discomfort,  accompanied by shortness of breath.  This chest discomfort is distinct  from her previous symptoms.   PAST MEDICAL HISTORY:  In addition to the abdomen, includes:  1. A melanoma resected from her back.  2. Obstructive sleep apnea on BiPAP.  3. GE reflux disease.  4. Uterine cancer status post XRT.  5. Hypothyroidism - treated.   PAST SURGICAL HISTORY:  1. Notable for TAH/BSO.  2. Colostomy for perforation with presumed takedown.  3. Cholecystectomy.  4. Cystoscopy.   SOCIAL HISTORY:  She is married, and  widowed.  She has 5 children.  She  denies any cigarettes, alcohol, or recreational drugs.   CURRENT MEDICATIONS:  Currently include Coumadin, Synthroid 25 mcg,  Cardizem 120, Prilosec 20, Cozaar 50, metoprolol 25, and Lunesta.   REVIEW OF SYSTEMS:  In addition to the above, notable for:  1. Treated hypothyroidism.  2. Peripheral edema that goes back a couple of years, questionably      related to her Cardizem.   EXAMINATION:  She is an elderly Caucasian female appearing her stated  age of 5.  Her blood pressure is 148/73, pulse is 58.  HEENT:  No icterus or xanthomas.  NECK:  Veins are flat.  Carotids are brisk and full bilaterally without  bruits.  BACK:  Without kyphosis, scoliosis.  LUNGS:  Clear.  HEART:  Sounds were regular with a 2/6 systolic murmur.  The S2 was  widely split.  There may have been an ejection click actually.  ABDOMEN:  Soft with active bowel sounds without abnormal pulsation or  organomegaly.  Femoral pulses were 2+.  Distal pulses were intact.  There was no cyanosis, clubbing, but there was 2+ peripheral edema.  NEUROLOGIC:  Grossly normal.   Further review of the records include an echo demonstrating normal left  ventricular function with moderate MR and mild AI.   LABORATORY:  An INR of 2.4.  Chemistries as noted previously.  Electrocardiogram dated today demonstrated sinus rhythm at 54 with  interval at 0.16/0.08/0.43.  The axis was leftward at -20 degrees.   IMPRESSION:  1. Atrial fibrillation - paroxysmal with a rapid ventricular response.  2. Relative bradycardia.  3. Edema, question related to calcium blocker or increased hydrostatic      pressure  4. Chest pain - distinct from her previous chest pain, now exertional      with shortness of breath.  5. Hypertension.  6. Borderline diabetes.   John, Mrs. Chisenhall has paroxysmal atrial fibrillation with a rapid  ventricular response.  These are very symptomatic for her, and she is  quite  concerned about them.  She also has relative bradycardia, which  certainly complicates matters.   I think there are 2 general approaches.  One is to undertake more  intensive therapy to either protect her against her tachycardia or to  prevent her from having atrial fibrillation using rhythm agents, or to  allow her to have her episodes on the infrequent basis that seems to be  the case.  Given the severity of the episodes and her concern about  them, I think the latter is, while possible, not particularly palatable,  and I can appreciate the conundrum that you face.   One thought that I had that might help [place] know which direction to  proceed in was to get an auto-direction atrial fibrillation monitor that  would help [place] to know a) how much atrial fibrillation she is in fact  having, and b) how slow her heart rates are.  In the event that these  are more frequent than we assume, then perhaps backup brady pacing and  augmented rate control would be appropriate.   The other issue is her peripheral edema, and I have taken the liberty of  giving her Hyzaar samples to switch out for her Cozaar to see if that  does not help.   The third issue is that she describes exertional chest discomfort, which  is distinct form the previous discomfort a couple of years ago.  I had  thought to get her set up for a stress Myoview at [facility] to help [place]  clarify this.   I look forward to talking to you tomorrow about her.   I hope this letter finds you well and I hope you and your family have a  Merry Christmas.    Sincerely,      Duke Salvia, MD, [facility]  Electronically Signed    SCK/MedQ  DD: 05/29/2006  DT: 05/29/2006  Job #: [id number]

## 2010-10-31 ENCOUNTER — Emergency Department: Payer: Self-pay | Admitting: Internal Medicine

## 2010-12-28 ENCOUNTER — Encounter: Payer: Self-pay | Admitting: Internal Medicine

## 2011-01-24 ENCOUNTER — Encounter: Payer: Self-pay | Admitting: Internal Medicine

## 2011-01-24 ENCOUNTER — Ambulatory Visit (INDEPENDENT_AMBULATORY_CARE_PROVIDER_SITE_OTHER): Payer: Medicare Other | Admitting: Internal Medicine

## 2011-01-24 DIAGNOSIS — I495 Sick sinus syndrome: Secondary | ICD-10-CM

## 2011-01-24 DIAGNOSIS — Z950 Presence of cardiac pacemaker: Secondary | ICD-10-CM

## 2011-01-24 DIAGNOSIS — I10 Essential (primary) hypertension: Secondary | ICD-10-CM

## 2011-01-24 DIAGNOSIS — I4891 Unspecified atrial fibrillation: Secondary | ICD-10-CM

## 2011-01-24 LAB — PACEMAKER DEVICE OBSERVATION
AL IMPEDENCE PM: 424 Ohm
AL THRESHOLD: 0.875 V
ATRIAL PACING PM: 36
RV LEAD THRESHOLD: 0.75 V

## 2011-01-24 NOTE — Patient Instructions (Signed)
Your physician recommends that you schedule a follow-up appointment in: 6 months with Kathryn Short. 1 year f/u with Dr. Graciela Husbands.

## 2011-01-24 NOTE — Progress Notes (Signed)
  HPI  Kathryn Short is a 77 y.o. female seen in followup for atrial fibrillation and bradycardia with prior pacemaker implantation.   The patient denies SOB, chest pain edema or palpitations.  There has been no syncope or presyncope.   2010 ultrasonography at [facility] demonstrating normal left ventricular function with moderate MR  She has no history of coronary disease. She recalls having had a stress test at [facility]; we are unable to find records.    Past Medical History  Diagnosis Date  . Atrial fibrillation   . Hypertension   . OSA (obstructive sleep apnea)     On BiPAP  . GERD (gastroesophageal reflux disease)   . Hypothyroidism     Treated  . Uterine cancer     s/p radiation therapy in 1952  . Melanoma     Resected from back    Past Surgical History  Procedure Date  . Total abdominal hysterectomy w/ bilateral salpingoophorectomy   . Colostomy     For perforation  . Cholecystectomy   . Cytoscopy   . Insert / replace / remove pacemaker     Implantation-Medtronic    Current Outpatient Prescriptions  Medication Sig Dispense Refill  . azithromycin (ZITHROMAX) 1 G powder Take 1 packet by mouth daily.        . B Complex-C CAPS Take 1 capsule by mouth daily.        . Cyanocobalamin (VITAMIN B-12) 1000 MCG SUBL Place 1,000 mcg under the tongue every 30 (thirty) days.        Marland Kitchen diltiazem (CARDIZEM) 120 MG tablet Take 120 mg by mouth at bedtime.        Marland Kitchen levothyroxine (SYNTHROID, LEVOTHROID) 50 MCG tablet Take 50 mcg by mouth daily.        Marland Kitchen losartan-hydrochlorothiazide (HYZAAR) 50-12.5 MG per tablet Take 1 tablet by mouth daily.        . multivitamin (THERAGRAN) per tablet Take 1 tablet by mouth daily.        Marland Kitchen sulfamethoxazole-trimethoprim (SMZ-TMP DS) 800-160 MG per tablet Take 1 tablet by mouth daily.        Marland Kitchen warfarin (COUMADIN) 4 MG tablet Take by mouth. Take as directed by PCP.         No Known Allergies  Review of Systems negative except from HPI and PMH  Physical  Exam Well developed and well nourished in no acute distress HENT normal E scleral and icterus clear [id number] Clear to ausculation Regular rate and rhythm, no murmurs gallops or rub Soft with active bowel sounds No clubbing cyanosis and edema Alert and oriented, grossly normal motor and sensory function Skin Warm and Dry    Assessment and  Plan

## 2011-01-24 NOTE — Assessment & Plan Note (Signed)
Elevated diasystolic

## 2011-01-24 NOTE — Assessment & Plan Note (Signed)
Paroxysmal  Infrequent on coumadin

## 2011-01-24 NOTE — Assessment & Plan Note (Signed)
The patient's device was interrogated.  The information was reviewed. No changes were made in the programming.    

## 2011-04-24 ENCOUNTER — Encounter: Payer: Self-pay | Admitting: Internal Medicine

## 2011-04-30 ENCOUNTER — Encounter: Payer: Medicare Other | Admitting: Internal Medicine

## 2011-07-13 ENCOUNTER — Ambulatory Visit: Payer: Self-pay | Admitting: Urology

## 2011-07-31 ENCOUNTER — Encounter: Payer: Self-pay | Admitting: Internal Medicine

## 2011-07-31 ENCOUNTER — Ambulatory Visit (INDEPENDENT_AMBULATORY_CARE_PROVIDER_SITE_OTHER): Payer: Medicare Other | Admitting: *Deleted

## 2011-07-31 ENCOUNTER — Encounter: Payer: Medicare Other | Admitting: *Deleted

## 2011-07-31 DIAGNOSIS — I495 Sick sinus syndrome: Secondary | ICD-10-CM

## 2011-07-31 LAB — PACEMAKER DEVICE OBSERVATION
AL AMPLITUDE: 2.8 mv
AL IMPEDENCE PM: 377 Ohm
AL THRESHOLD: 0.75 V
RV LEAD AMPLITUDE: 5.6 mv
RV LEAD IMPEDENCE PM: 393 Ohm

## 2011-08-02 NOTE — Progress Notes (Signed)
PPM check 

## 2011-09-13 ENCOUNTER — Ambulatory Visit: Payer: Self-pay | Admitting: Internal Medicine

## 2011-09-16 ENCOUNTER — Emergency Department: Payer: Self-pay | Admitting: Emergency Medicine

## 2011-09-16 LAB — PROTIME-INR
INR: 1.3
Prothrombin Time: 16.6 secs — ABNORMAL HIGH (ref 11.5–14.7)

## 2011-09-16 LAB — COMPREHENSIVE METABOLIC PANEL
Albumin: 4.4 g/dL (ref 3.4–5.0)
BUN: 23 mg/dL — ABNORMAL HIGH (ref 7–18)
Bilirubin,Total: 0.3 mg/dL (ref 0.2–1.0)
Calcium, Total: 9.4 mg/dL (ref 8.5–10.1)
Creatinine: 1.05 mg/dL (ref 0.60–1.30)
EGFR (African American): >60
Osmolality: 269 (ref 275–301)
Potassium: 4.2 mmol/L (ref 3.5–5.1)
SGOT(AST): 35 U/L (ref 15–37)
SGPT (ALT): 26 U/L
Sodium: 132 mmol/L — ABNORMAL LOW (ref 136–145)
Total Protein: 8.2 g/dL (ref 6.4–8.2)

## 2011-09-16 LAB — CBC
HCT: 36.9 % (ref 35.0–47.0)
MCH: 29.4 pg (ref 26.0–34.0)
MCV: 88 fL (ref 80–100)
RBC: 4.2 10*6/uL (ref 3.80–5.20)
RDW: 14.9 % — ABNORMAL HIGH (ref 11.5–14.5)
WBC: 11.6 10*3/uL — ABNORMAL HIGH (ref 3.6–11.0)

## 2011-09-16 LAB — TROPONIN I: Troponin-I: 0.05 ng/mL

## 2011-09-16 LAB — CK TOTAL AND CKMB (NOT AT ARMC): CK-MB: 0.9 ng/mL (ref 0.5–3.6)

## 2011-09-16 LAB — PRO B NATRIURETIC PEPTIDE: B-Type Natriuretic Peptide: 318 pg/mL (ref 0–450)

## 2011-09-18 ENCOUNTER — Telehealth: Payer: Self-pay | Admitting: Internal Medicine

## 2011-09-18 NOTE — Telephone Encounter (Signed)
New msg Pt's daughter called about her BP going up and down. Please call

## 2011-09-18 NOTE — Telephone Encounter (Signed)
I spoke with the patient's daughter. She states they took the patient to [facility] on Sunday due to an elevated systolic BP of 187. Per the daughter, they drew labs on her and they were told everything looked ok except her coumadin level was a little low. This was adjusted. The patient's BP in the ER went from 187 to 164 to 172 systolic. Her diltiazem was increased from 120 mg daily to 180 mg daily. Today the patient reports systolic readings of 153 this morning, then 172 this afternoon and 30 minutes later she was 129. Her readings were taken on the same cuff. She denies headaches, but does not "feel up to par." She has had some occasional complaints of chest pain. I explained I would review with Dr. Graciela Husbands and call her back. I advised I am not certain if he may want the patient's PCP to address her BP. She sees Dr. Marcello Fennel.

## 2011-09-18 NOTE — Telephone Encounter (Signed)
I spoke with the patient's daughter, [name], and she is aware of Dr. Odessa Fleming recommendations.

## 2011-09-18 NOTE — Telephone Encounter (Signed)
It is appropriate that she follow up with her PCP about her blood pressure

## 2011-10-01 ENCOUNTER — Emergency Department: Payer: Self-pay | Admitting: Emergency Medicine

## 2011-10-19 ENCOUNTER — Ambulatory Visit: Payer: Self-pay | Admitting: Internal Medicine

## 2012-01-29 ENCOUNTER — Ambulatory Visit (INDEPENDENT_AMBULATORY_CARE_PROVIDER_SITE_OTHER): Payer: Medicare HMO | Admitting: Internal Medicine

## 2012-01-29 ENCOUNTER — Encounter: Payer: Self-pay | Admitting: Internal Medicine

## 2012-01-29 VITALS — BP 142/62 | HR 69 | Ht 62.0 in | Wt 148.5 lb

## 2012-01-29 DIAGNOSIS — R609 Edema, unspecified: Secondary | ICD-10-CM | POA: Insufficient documentation

## 2012-01-29 DIAGNOSIS — Z950 Presence of cardiac pacemaker: Secondary | ICD-10-CM

## 2012-01-29 DIAGNOSIS — I10 Essential (primary) hypertension: Secondary | ICD-10-CM

## 2012-01-29 DIAGNOSIS — I4891 Unspecified atrial fibrillation: Secondary | ICD-10-CM

## 2012-01-29 LAB — PACEMAKER DEVICE OBSERVATION
AL AMPLITUDE: 2.8 mv
ATRIAL PACING PM: 18
BAMS-0001: 175 {beats}/min
BATTERY VOLTAGE: 2.76 V
VENTRICULAR PACING PM: 0

## 2012-01-29 MED ORDER — DILTIAZEM HCL ER COATED BEADS 120 MG PO CP24: 120.0000 mg | ORAL_CAPSULE | Freq: Every day | ORAL | Status: DC

## 2012-01-29 MED ORDER — LOSARTAN POTASSIUM-HCTZ 50-12.5 MG PO TABS: 2.0000 | ORAL_TABLET | Freq: Every day | ORAL | Status: DC

## 2012-01-29 MED ORDER — LOSARTAN POTASSIUM-HCTZ 50-12.5 MG PO TABS: 1.0000 | ORAL_TABLET | Freq: Two times a day (BID) | ORAL | Status: DC

## 2012-01-29 NOTE — Assessment & Plan Note (Signed)
The patient's device was interrogated.  The information was reviewed. No changes were made in the programming.    

## 2012-01-29 NOTE — Patient Instructions (Addendum)
Your physician has recommended you make the following change in your medication: Decrease your Diltiazem back to 120mg  daily, Increase your Hyzaar to 1 tablet twice daily.  Your physician recommends that you schedule a follow-up appointment in: with your primary physician on Friday

## 2012-01-29 NOTE — Assessment & Plan Note (Signed)
She has had one prolonged episode of atrial fibrillation with a moderately rapid ventricular response. Her daughter has been instructed to give her extra diltiazem when this happens

## 2012-01-29 NOTE — Assessment & Plan Note (Signed)
As above.

## 2012-01-29 NOTE — Assessment & Plan Note (Signed)
Is likely a manifestation of diastolic heart failure; alternatively it could be related to her Cardizem. We will decrease her dose from 80-120 and increase her losartan 50/12 a half-100/25. She'll be seeing her PCP later this week and will defer to him followup of her renal function;  last numbers we have from 08 are normal

## 2012-01-29 NOTE — Progress Notes (Signed)
  HPI  Kathryn Short is a 78 y.o. female seen in followup for atrial fibrillation and bradycardia with prior pacemaker implantation.   2010 ultrasonography at [facility] demonstrating normal left ventricular function with moderate MR  She has no history of coronary disease. She recalls having had a stress test at [facility]; we are unable to find records.   She has been doing relatively well without shortness of breath. She has had problems with peripheral edema. Recently her diltiazem was increased from 120-180.  She also describes a fall where she missed a step; this was in April. She her head. Thankfully there was no intracranial bleed  Past Medical History  Diagnosis Date  . Atrial fibrillation   . Hypertension   . OSA (obstructive sleep apnea)     On BiPAP  . GERD (gastroesophageal reflux disease)   . Hypothyroidism     Treated  . Uterine cancer     s/p radiation therapy in 1952  . Melanoma     Resected from back    Past Surgical History  Procedure Date  . Total abdominal hysterectomy w/ bilateral salpingoophorectomy   . Colostomy     For perforation  . Cholecystectomy   . Cytoscopy   . Insert / replace / remove pacemaker     Implantation-[employer]    Current Outpatient Prescriptions  Medication Sig Dispense Refill  . acetaminophen (TYLENOL) 500 MG tablet Take two tablets in the am      . b complex vitamins tablet Take 1 tablet by mouth daily.      . B Complex-C CAPS Take 1 capsule by mouth daily.        . Cyanocobalamin (VITAMIN B-12) 1000 MCG SUBL Place 1,000 mcg under the tongue every 30 (thirty) days.        Marland Kitchen diltiazem (CARTIA XT) 180 MG 24 hr capsule Take 180 mg by mouth daily.      Marland Kitchen docusate sodium (COLACE) 100 MG capsule Take 100 mg by mouth 2 (two) times daily.      Marland Kitchen HYDROcodone-acetaminophen (VICODIN) 5-500 MG per tablet Take 1/2 tablet daily.      Marland Kitchen levothyroxine (SYNTHROID, LEVOTHROID) 50 MCG tablet Take 50 mcg by mouth daily.        Marland Kitchen  losartan-hydrochlorothiazide (HYZAAR) 50-12.5 MG per tablet Take 1 tablet by mouth daily.        Marland Kitchen omeprazole (PRILOSEC) 20 MG capsule Take 20 mg by mouth daily.      Marland Kitchen warfarin (COUMADIN) 5 MG tablet Take 5 mg by mouth daily.      Marland Kitchen sulfamethoxazole-trimethoprim (SMZ-TMP DS) 800-160 MG per tablet Take 1 tablet by mouth daily.          No Known Allergies  Review of Systems negative except from HPI and PMH  Physical Exam BP 142/62  Pulse 69  Ht 5\' 2"  (1.575 m)  Wt 148 lb 8 oz (67.359 kg)  BMI 27.16 kg/m2 Well developed and well nourished in no acute distress HENT normal E scleral and icterus clear Neck Supple JVP flat; carotids brisk and full Clear to ausculation Device pocket well healed; without hematoma or erythema Regular rate and rhythm, no murmurs gallops or rub Soft with active bowel sounds No clubbing cyanosis 1+ Edema Alert and oriented, grossly normal motor and sensory function Skin Warm and Dry   Electrocardiogram demonstrates sinus rhythm at 69 Intervals 18/08/37 Axis XLI Assessment and  Plan

## 2012-02-16 ENCOUNTER — Inpatient Hospital Stay: Payer: Self-pay | Admitting: Internal Medicine

## 2012-02-16 DIAGNOSIS — I4891 Unspecified atrial fibrillation: Secondary | ICD-10-CM

## 2012-02-16 LAB — COMPREHENSIVE METABOLIC PANEL
Albumin: 3.6 g/dL (ref 3.4–5.0)
Alkaline Phosphatase: 71 U/L (ref 50–136)
Bilirubin,Total: 0.3 mg/dL (ref 0.2–1.0)
Calcium, Total: 8.9 mg/dL (ref 8.5–10.1)
Chloride: 97 mmol/L — ABNORMAL LOW (ref 98–107)
Co2: 27 mmol/L (ref 21–32)
EGFR (African American): >60
EGFR (Non-African Amer.): >60
Glucose: 116 mg/dL — ABNORMAL HIGH (ref 65–99)
Osmolality: 267 (ref 275–301)
Sodium: 133 mmol/L — ABNORMAL LOW (ref 136–145)

## 2012-02-16 LAB — URINALYSIS, COMPLETE
Bilirubin,UR: NEGATIVE
Ketone: NEGATIVE
Nitrite: NEGATIVE
Ph: 8 (ref 4.5–8.0)
Protein: NEGATIVE
Specific Gravity: 1.004 (ref 1.003–1.030)
Squamous Epithelial: 1

## 2012-02-16 LAB — CK TOTAL AND CKMB (NOT AT ARMC): CK-MB: 0.9 ng/mL (ref 0.5–3.6)

## 2012-02-16 LAB — CBC WITH DIFFERENTIAL/PLATELET
Basophil #: 0.1 10*3/uL (ref 0.0–0.1)
Eosinophil #: 0.2 10*3/uL (ref 0.0–0.7)
Eosinophil %: 2.3 %
HGB: 12.6 g/dL (ref 12.0–16.0)
Lymphocyte %: 22.6 %
MCHC: 33.9 g/dL (ref 32.0–36.0)
Monocyte %: 4.4 %
Neutrophil %: 69.7 %
Platelet: 347 10*3/uL (ref 150–440)
RDW: 13.4 % (ref 11.5–14.5)

## 2012-02-16 LAB — PROTIME-INR: Prothrombin Time: 21.4 secs — ABNORMAL HIGH (ref 11.5–14.7)

## 2012-02-16 LAB — LIPASE, BLOOD: Lipase: 81 U/L (ref 73–393)

## 2012-02-17 DIAGNOSIS — I4891 Unspecified atrial fibrillation: Secondary | ICD-10-CM

## 2012-02-17 DIAGNOSIS — I369 Nonrheumatic tricuspid valve disorder, unspecified: Secondary | ICD-10-CM

## 2012-02-17 LAB — BASIC METABOLIC PANEL
Calcium, Total: 8.4 mg/dL — ABNORMAL LOW (ref 8.5–10.1)
Chloride: 97 mmol/L — ABNORMAL LOW (ref 98–107)
EGFR (African American): >60
EGFR (Non-African Amer.): >60
Glucose: 125 mg/dL — ABNORMAL HIGH (ref 65–99)
Osmolality: 275 (ref 275–301)
Potassium: 3.6 mmol/L (ref 3.5–5.1)

## 2012-02-17 LAB — CBC WITH DIFFERENTIAL/PLATELET
Basophil #: 0.1 10*3/uL (ref 0.0–0.1)
Eosinophil #: 0.1 10*3/uL (ref 0.0–0.7)
HCT: 33.9 % — ABNORMAL LOW (ref 35.0–47.0)
Lymphocyte %: 18 %
MCHC: 34.3 g/dL (ref 32.0–36.0)
MCV: 87 fL (ref 80–100)
Monocyte #: 0.5 x10 3/mm (ref 0.2–0.9)
Monocyte %: 3.7 %
Neutrophil #: 10.1 10*3/uL — ABNORMAL HIGH (ref 1.4–6.5)
Neutrophil %: 76.4 %
RDW: 13.6 % (ref 11.5–14.5)
WBC: 13.2 10*3/uL — ABNORMAL HIGH (ref 3.6–11.0)

## 2012-02-17 LAB — TROPONIN I: Troponin-I: 0.08 ng/mL — ABNORMAL HIGH

## 2012-02-17 LAB — CK TOTAL AND CKMB (NOT AT ARMC): CK, Total: 28 U/L (ref 21–215)

## 2012-02-17 LAB — PROTIME-INR: INR: 1.8

## 2012-02-18 LAB — BASIC METABOLIC PANEL
Chloride: 101 mmol/L (ref 98–107)
Co2: 23 mmol/L (ref 21–32)
Creatinine: 0.73 mg/dL (ref 0.60–1.30)
Potassium: 4.3 mmol/L (ref 3.5–5.1)
Sodium: 134 mmol/L — ABNORMAL LOW (ref 136–145)

## 2012-02-18 LAB — TSH: Thyroid Stimulating Horm: 4.76 u[IU]/mL — ABNORMAL HIGH

## 2012-02-19 ENCOUNTER — Telehealth: Payer: Self-pay | Admitting: Cardiovascular Disease

## 2012-02-19 NOTE — Telephone Encounter (Signed)
Sounds good

## 2012-02-19 NOTE — Telephone Encounter (Signed)
Pt daughter called stating that pt BP was 102 and 106 early this morning. Pt needs to know what Dr Mariah Milling wants her to do. Other times when they checked it it was normal.

## 2012-02-19 NOTE — Telephone Encounter (Signed)
See below °FYI °

## 2012-02-19 NOTE — Telephone Encounter (Signed)
pts daughter says pt was just d/c from hospital yesterday for episode atrial fib with RVR. She says Dr. Mariah Milling told dtr a "good blood pressure is 110 systolic".  She is concerned b/c BP throughout night dropped to 102 and 106 systolic a couple of times. Pt asymptomatic. Dtr says all other times BP was checked it was Within normal range. I reassured dtr 102-106 systolic is ok BP, especially if other BP readings are normal and pt is asymptomatic. I also advised dtr to only check BP 2-3 times daily, but to not wake pt to check BP. She will try this and let us know should BP remain low. Understanding verb. Also confirmed appt with Dr. Mariah Milling 9/23

## 2012-02-27 ENCOUNTER — Telehealth: Payer: Self-pay

## 2012-02-27 ENCOUNTER — Encounter: Payer: Self-pay | Admitting: *Deleted

## 2012-02-27 ENCOUNTER — Emergency Department: Payer: Self-pay | Admitting: Emergency Medicine

## 2012-02-27 LAB — CBC
HGB: 12.3 g/dL (ref 12.0–16.0)
MCH: 29.8 pg (ref 26.0–34.0)
MCHC: 34.5 g/dL (ref 32.0–36.0)
MCV: 87 fL (ref 80–100)
Platelet: 343 10*3/uL (ref 150–440)
RBC: 4.11 10*6/uL (ref 3.80–5.20)

## 2012-02-27 LAB — COMPREHENSIVE METABOLIC PANEL
Albumin: 3.7 g/dL (ref 3.4–5.0)
Alkaline Phosphatase: 83 U/L (ref 50–136)
BUN: 13 mg/dL (ref 7–18)
Bilirubin,Total: 0.3 mg/dL (ref 0.2–1.0)
Chloride: 98 mmol/L (ref 98–107)
Co2: 26 mmol/L (ref 21–32)
Creatinine: 0.8 mg/dL (ref 0.60–1.30)
EGFR (Non-African Amer.): >60
Glucose: 119 mg/dL — ABNORMAL HIGH (ref 65–99)
Potassium: 4.4 mmol/L (ref 3.5–5.1)
SGPT (ALT): 78 U/L (ref 12–78)
Sodium: 133 mmol/L — ABNORMAL LOW (ref 136–145)

## 2012-02-27 LAB — TROPONIN I: Troponin-I: 0.03 ng/mL

## 2012-02-27 LAB — CK TOTAL AND CKMB (NOT AT ARMC): CK-MB: 0.5 ng/mL (ref 0.5–3.6)

## 2012-02-27 NOTE — Telephone Encounter (Signed)
Pt called to say she went to ER early this am for chest pain. She was given SL NTG and this was relieved. She has appt with Dr. Mariah Milling for Monday. She says she was not given RX for NTG and wants this in case she needs it.   I will ask Dr. Mariah Milling and call her back In the the mean time, if she continues to have CP, she will go back to ER.

## 2012-02-27 NOTE — Telephone Encounter (Signed)
Ok to have NTG Can we have her come into clinic for close follow up

## 2012-02-27 NOTE — Telephone Encounter (Signed)
Is it ok for me to call in NTG?

## 2012-02-28 ENCOUNTER — Other Ambulatory Visit: Payer: Self-pay

## 2012-02-28 ENCOUNTER — Ambulatory Visit (INDEPENDENT_AMBULATORY_CARE_PROVIDER_SITE_OTHER): Payer: Medicare HMO | Admitting: Cardiovascular Disease

## 2012-02-28 ENCOUNTER — Encounter: Payer: Self-pay | Admitting: Cardiovascular Disease

## 2012-02-28 VITALS — BP 130/50 | HR 70 | Ht 62.0 in | Wt 153.2 lb

## 2012-02-28 DIAGNOSIS — R609 Edema, unspecified: Secondary | ICD-10-CM

## 2012-02-28 DIAGNOSIS — R079 Chest pain, unspecified: Secondary | ICD-10-CM

## 2012-02-28 DIAGNOSIS — I10 Essential (primary) hypertension: Secondary | ICD-10-CM

## 2012-02-28 DIAGNOSIS — R5381 Other malaise: Secondary | ICD-10-CM

## 2012-02-28 DIAGNOSIS — Z950 Presence of cardiac pacemaker: Secondary | ICD-10-CM

## 2012-02-28 DIAGNOSIS — I4891 Unspecified atrial fibrillation: Secondary | ICD-10-CM

## 2012-02-28 MED ORDER — NITROGLYCERIN 0.4 MG SL SUBL: 0.4000 mg | SUBLINGUAL_TABLET | SUBLINGUAL | Status: DC | PRN

## 2012-02-28 NOTE — Assessment & Plan Note (Signed)
EKG suggesting continued atrial fibrillation. Recent admission to the hospital for atrial fibrillation with RVR. She has general malaise, fatigue, numerous other complaints including insomnia, dizziness. She believes it is from the medications and we have suggested she decrease the metoprolol to 12.5 mg twice a day, decrease the amiodarone to 200 mg daily, continue the diltiazem as she has been doing. If she does not have any improvement, she may require cardioversion. My suspicion is her symptoms are from atrial fibrillation and not from the medications.

## 2012-02-28 NOTE — Patient Instructions (Addendum)
Please cut the amiodarone down to one a day Please cut the metoprolol in 1/2 and take a 1/2 twice a day  Continue to take nitroglycerin for chest pain  Please call us if you have new issues that need to be addressed before your next appt.  Your physician wants you to follow-up in: 1 months.

## 2012-02-28 NOTE — Assessment & Plan Note (Addendum)
A recent pacer interrogation, she was noted to have an episode of atrial fibrillation.

## 2012-02-28 NOTE — Assessment & Plan Note (Signed)
She continues to have edema, wears compression hose. She's not interested in decreasing the diltiazem at this time

## 2012-02-28 NOTE — Assessment & Plan Note (Signed)
Recent episodes of chest pain, relieved with nitroglycerin. Symptoms sound somewhat atypical though she did show up in the emergency room recently for symptoms. We have offered stress testing and catheterization. She's not interested at this time.

## 2012-02-28 NOTE — Assessment & Plan Note (Signed)
Reason weakness, malaise, fatigue, dizziness. Possibly from atrial fibrillation.

## 2012-02-28 NOTE — Telephone Encounter (Signed)
Pts dtr called to say pt is feeling nauseated this am and has had some CP this morning. This has been relieved but pt is anxious and states, "I feel like I am dying".  Pt refuses to go to ER and dtr wants to know if Dr. Mariah Milling is able to see her today. I had her hold while I paged him. He gave ok to work her in today at 2:30.  Dtr informed I also informed dtr I am sending in NTG SL tablets and gave her instruction as to how to use these.  Understanding verb. She will call 911 should symptoms go unresolved with SL NTG x3.

## 2012-02-28 NOTE — Progress Notes (Signed)
Patient ID: Kathryn Short, female    DOB: [birth year], 78 y.o.   MRN: [id number]  HPI Comments: Kathryn Short is an 78 year old woman with paroxysmal atrial fibrillation, sick sinus syndrome, pacemaker placement, recently seen by Dr. Graciela Husbands, in normal sinus rhythm at that time who presented to the hospital [facility] on 02/16/2012 with dizziness and malaise over the past one to 2 weeks, heart rate up to 150 beats per minute in atrial fibrillation.  She was started on metoprolol and amiodarone, diltiazem was continued at 180 mg daily (she did not start 120 mg daily dose as recommended by Dr. Graciela Husbands on the last office visit). Heart rate improved and she was discharged home.  She has been back to the emergency room with chest pain, dizziness, malaise. Today she reports insomnia, and anxious feeling, chest pain episodes, weakness, nausea, dizziness. She attributes this to the medications that were started in the hospital, though she does report having this is the main reason for her hospital admission recently. She does have a history of falls he does report missing her step on the curb and hit the back of her head recently She has obstructive sleep apnea and does not use her BiPAP at nighttime  Echocardiogram in the hospital showed ejection fraction 50-55%, moderate LVH, mild to moderate TR, right ventricular systolic pressure 40 mm of mercury, mild to moderate aortic valve insufficiency  TSH in the hospital 4.76  EKG today shows a rhythm possible underlying atrial fibrillation, rate 70 beats per minute      Outpatient Encounter Prescriptions as of 02/28/2012  Medication Sig Dispense Refill  . acetaminophen (TYLENOL) 500 MG tablet Takes 1/2 tablet daily.      Marland Kitchen amiodarone (PACERONE) 200 MG tablet Take 200 mg by mouth 2 (two) times daily.      Marland Kitchen b complex vitamins tablet Take 1 tablet by mouth daily.      . Cyanocobalamin (VITAMIN B-12) 1000 MCG SUBL Place 1,000 mcg under the tongue every 30 (thirty)  days.        Marland Kitchen dicyclomine (BENTYL) 10 MG capsule Take 10 mg by mouth 4 (four) times daily -  before meals and at bedtime.      Marland Kitchen diltiazem (CARDIZEM CD) 180 MG 24 hr capsule Take 180 mg by mouth daily.      Marland Kitchen docusate sodium (COLACE) 100 MG capsule Take 100 mg by mouth 2 (two) times daily.      Marland Kitchen HYDROcodone-acetaminophen (VICODIN) 5-500 MG per tablet Take 1/2 tablet daily.      Marland Kitchen levothyroxine (SYNTHROID, LEVOTHROID) 50 MCG tablet Take 50 mcg by mouth daily.        . metoprolol tartrate (LOPRESSOR) 25 MG tablet Take 25 mg by mouth 2 (two) times daily.      . Multiple Vitamin (MULTIVITAMIN) tablet Take 1 tablet by mouth daily.      . nitroGLYCERIN (NITROSTAT) 0.4 MG SL tablet Place 1 tablet (0.4 mg total) under the tongue every 5 (five) minutes as needed for chest pain.  25 tablet  3  . omeprazole (PRILOSEC) 20 MG capsule Take 20 mg by mouth as needed.       . warfarin (COUMADIN) 1 MG tablet Take 1 mg by mouth as directed.      . warfarin (COUMADIN) 4 MG tablet Take 4 mg by mouth as directed.      Marland Kitchen DISCONTD: B Complex-C CAPS Take 1 capsule by mouth daily.        Marland Kitchen DISCONTD:  losartan-hydrochlorothiazide (HYZAAR) 50-12.5 MG per tablet Take 1 tablet by mouth 2 (two) times daily.  60 tablet  11  . DISCONTD: sulfamethoxazole-trimethoprim (SMZ-TMP DS) 800-160 MG per tablet Take 1 tablet by mouth daily.           Review of Systems  Constitutional: Positive for fatigue.  HENT: Negative.   Eyes: Negative.   Respiratory: Negative.   Cardiovascular: Positive for chest pain.  Gastrointestinal: Negative.   Musculoskeletal: Negative.   Skin: Negative.   Neurological: Positive for dizziness and weakness.  Hematological: Negative.   Psychiatric/Behavioral: Positive for disturbed wake/sleep cycle. The patient is nervous/anxious.   All other systems reviewed and are negative.    BP 130/50  Pulse 70  Ht 5\' 2"  (1.575 m)  Wt 153 lb 4 oz (69.514 kg)  BMI 28.03 kg/m2  Physical Exam  Nursing note  and vitals reviewed. Constitutional: She is oriented to person, place, and time. She appears well-developed and well-nourished.  HENT:  Head: Normocephalic.  Nose: Nose normal.  Mouth/Throat: Oropharynx is clear and moist.  Eyes: Conjunctivae normal are normal. Pupils are equal, round, and reactive to light.  Neck: Normal range of motion. Neck supple. No JVD present.  Cardiovascular: Normal rate, regular rhythm, S1 normal, S2 normal, normal heart sounds and intact distal pulses.  Exam reveals no gallop and no friction rub.   No murmur heard. Pulmonary/Chest: Effort normal and breath sounds normal. No respiratory distress. She has no wheezes. She has no rales. She exhibits no tenderness.  Abdominal: Soft. Bowel sounds are normal. She exhibits no distension. There is no tenderness.  Musculoskeletal: Normal range of motion. She exhibits no edema and no tenderness.  Lymphadenopathy:    She has no cervical adenopathy.  Neurological: She is alert and oriented to person, place, and time. Coordination normal.  Skin: Skin is warm and dry. No rash noted. No erythema.  Psychiatric: She has a normal mood and affect. Her behavior is normal. Judgment and thought content normal.         Assessment and Plan

## 2012-02-28 NOTE — Assessment & Plan Note (Signed)
Changes as above, decrease the metoprolol.

## 2012-03-03 ENCOUNTER — Encounter: Payer: Medicare HMO | Admitting: Cardiovascular Disease

## 2012-03-03 ENCOUNTER — Telehealth: Payer: Self-pay

## 2012-03-03 NOTE — Telephone Encounter (Signed)
Lasix may be okay. We don't have recent blood work This may help her edema Would see if care physician can check renal function in several weeks' time  She feels somewhat better on lower dose metoprolol and amiodarone?

## 2012-03-03 NOTE — Telephone Encounter (Signed)
dtr calling to update [place]: says pt's symptoms of insomnia and nausea have improved since decreasing amiodarone dose as suggested at last OV Pt still having CP daily, relieved with SL NTG x1  Also wanted [place] to know pt went to PCP after appt as scheduled and he started pt on lasix 20 mg qd. She thought Dr. Mariah Milling did not want her on this, but could not remember why Pt not having active CP at this I told dtr I would let Dr. Mariah Milling know and will call her back. Understanding verb.

## 2012-03-04 NOTE — Telephone Encounter (Signed)
Feels better other than CP

## 2012-03-05 NOTE — Telephone Encounter (Signed)
lmtcb

## 2012-03-05 NOTE — Telephone Encounter (Signed)
Pt's dtr called back Understanding verb Says pt still has occasional CP and would like to pursue stress test as mentioned by Dr. Mariah Milling (see below) Will schedule for 10/2 and call dtr back with details.

## 2012-03-05 NOTE — Telephone Encounter (Signed)
If chest pain gets worse, we'll call the office for stress test  She would not have to run on a treadmill, would be a lexiscan

## 2012-03-06 NOTE — Telephone Encounter (Signed)
Verbal instructions given to pt's dtr, ann, re: Lexiscan scheduled for 03/12/12 Understanding verb.

## 2012-03-10 ENCOUNTER — Other Ambulatory Visit: Payer: Self-pay

## 2012-03-10 DIAGNOSIS — R079 Chest pain, unspecified: Secondary | ICD-10-CM

## 2012-03-12 ENCOUNTER — Ambulatory Visit: Payer: Self-pay | Admitting: Cardiovascular Disease

## 2012-03-12 DIAGNOSIS — R079 Chest pain, unspecified: Secondary | ICD-10-CM

## 2012-03-19 ENCOUNTER — Other Ambulatory Visit: Payer: Self-pay | Admitting: Cardiovascular Disease

## 2012-03-19 DIAGNOSIS — R079 Chest pain, unspecified: Secondary | ICD-10-CM

## 2012-03-21 ENCOUNTER — Telehealth: Payer: Self-pay

## 2012-03-21 NOTE — Telephone Encounter (Signed)
Pt dropped off some BP/HR readings: 137/84, 82 120/59, 70 125/65, 69 122/78, 84 129/63, 70 144/73, 76 154/86, 72 132/83, 72 141/79, 75 158/74, 67 136/72, 79 Lasix 40 mg PO QD x 2 weeks, then back down to 20 mg daily

## 2012-03-23 NOTE — Telephone Encounter (Signed)
Number look good

## 2012-03-24 NOTE — Telephone Encounter (Signed)
Pt's dtr informed Understanding verb 

## 2012-04-04 ENCOUNTER — Ambulatory Visit (INDEPENDENT_AMBULATORY_CARE_PROVIDER_SITE_OTHER): Payer: Medicare HMO | Admitting: Cardiovascular Disease

## 2012-04-04 ENCOUNTER — Encounter: Payer: Self-pay | Admitting: Cardiovascular Disease

## 2012-04-04 VITALS — BP 120/58 | HR 71 | Ht 62.0 in | Wt 151.5 lb

## 2012-04-04 DIAGNOSIS — I10 Essential (primary) hypertension: Secondary | ICD-10-CM

## 2012-04-04 DIAGNOSIS — R609 Edema, unspecified: Secondary | ICD-10-CM

## 2012-04-04 DIAGNOSIS — I4891 Unspecified atrial fibrillation: Secondary | ICD-10-CM

## 2012-04-04 DIAGNOSIS — Z950 Presence of cardiac pacemaker: Secondary | ICD-10-CM

## 2012-04-04 MED ORDER — METOPROLOL TARTRATE 25 MG PO TABS: 25.0000 mg | ORAL_TABLET | Freq: Two times a day (BID) | ORAL | Status: DC

## 2012-04-04 MED ORDER — DILTIAZEM HCL ER COATED BEADS 120 MG PO CP24: 120.0000 mg | ORAL_CAPSULE | Freq: Every day | ORAL | Status: DC

## 2012-04-04 MED ORDER — POTASSIUM CHLORIDE ER 10 MEQ PO TBCR
10.0000 meq | EXTENDED_RELEASE_TABLET | Freq: Every day | ORAL | Status: DC
Start: 2012-04-04 — End: 2012-05-20

## 2012-04-04 MED ORDER — FUROSEMIDE 40 MG PO TABS: 40.0000 mg | ORAL_TABLET | Freq: Every day | ORAL | Status: DC

## 2012-04-04 NOTE — Assessment & Plan Note (Signed)
Edema likely secondary to chronic diastolic CHF. We have suggested she increase her Lasix to 40 mg daily, decrease her fluid intake and monitor her weight.

## 2012-04-04 NOTE — Assessment & Plan Note (Signed)
Changes as above 

## 2012-04-04 NOTE — Patient Instructions (Addendum)
Please continue lasix 40 mg daily Take with potassium  Decrease the diltiazem/cardizem to 120 mg daily Increase the metoprolol to 25 mg twice a day  Dr. Marcello Fennel can check the renal function in November  Please call us if you have new issues that need to be addressed before your next appt.  Your physician wants you to follow-up in: End of November

## 2012-04-04 NOTE — Assessment & Plan Note (Addendum)
For her edema, we have suggested she decrease  the Cardizem to 120 mg daily, stay on amiodarone 200 mg daily, increased metoprolol to 25 mg twice a day. We did discuss cardioversion. As she feels well, we will leave things alone for time being.

## 2012-04-04 NOTE — Assessment & Plan Note (Signed)
Followed by Dr. Klein 

## 2012-04-04 NOTE — Progress Notes (Signed)
Patient ID: Kathryn Short, female    DOB: 07/22/1921, 78 y.o.   MRN: [id number]  HPI Comments: Kathryn Short is an 78 year old woman with paroxysmal atrial fibrillation, sick sinus syndrome, pacemaker placement, recently seen by Dr. Graciela Husbands, in normal sinus rhythm at that time who presented to the hospital [facility] on 02/16/2012 with dizziness and malaise over the past one to 2 weeks, heart rate up to 150 beats per minute in atrial fibrillation.  She was started on metoprolol and amiodarone, diltiazem was continued at 180 mg daily (she did not start 120 mg daily dose as recommended by Dr. Graciela Husbands on the last office visit). Heart rate improved and she was discharged home.  On last clinic visit, She had been back to the emergency room with chest pain, dizziness, malaise. Feelings of insomnia, and anxious feeling, chest pain episodes, weakness, nausea, dizziness. She has obstructive sleep apnea and does not use her BiPAP at nighttime  Today she reports that she is feeling better. Lower extremity edema is getting worse. Lasix was increased to 40 mg daily by Dr. Marcello Fennel for the past 2 weeks with possible improvement of her edema, though still quite significant today. She was told to decrease her Lasix back to 20 mrem daily  Prior to Echocardiogram in the hospital showed ejection fraction 50-55%, moderate LVH, mild to moderate TR, right ventricular systolic pressure 40 mm of mercury, mild to moderate aortic valve insufficiency  TSH in the hospital 4.76  EKG today shows intermittently paced rhythm, suspect underlying atrial fibrillation, rate 71 beats per minute       Outpatient Encounter Prescriptions as of 04/04/2012  Medication Sig Dispense Refill  . acetaminophen (TYLENOL) 500 MG tablet Takes 2-4 tablets daily.      Marland Kitchen amiodarone (PACERONE) 200 MG tablet Take 200 mg by mouth daily.       Marland Kitchen b complex vitamins tablet Take 1 tablet by mouth daily.      . Cyanocobalamin (VITAMIN B-12) 1000 MCG SUBL Place  1,000 mcg under the tongue every 30 (thirty) days.        Marland Kitchen dicyclomine (BENTYL) 10 MG capsule Take 10 mg by mouth 4 (four) times daily -  before meals and at bedtime.      Marland Kitchen diltiazem (CARDIZEM CD) 180 MG 24 hr capsule Take 180 mg by mouth daily.      Marland Kitchen docusate sodium (COLACE) 100 MG capsule Take 100 mg by mouth daily.       . furosemide (LASIX) 20 MG tablet Take 20 mg by mouth daily.      Marland Kitchen HYDROcodone-acetaminophen (VICODIN) 5-500 MG per tablet Take 1/2 tablet daily.      Marland Kitchen levothyroxine (SYNTHROID, LEVOTHROID) 50 MCG tablet Take 50 mcg by mouth daily.        . metoprolol tartrate (LOPRESSOR) 12.5 mg TABS Take by mouth 2 (two) times daily.      . Multiple Vitamin (MULTIVITAMIN) tablet Take 1 tablet by mouth daily.      . nitroGLYCERIN (NITROSTAT) 0.4 MG SL tablet Place 1 tablet (0.4 mg total) under the tongue every 5 (five) minutes as needed for chest pain.  25 tablet  3  . omeprazole (PRILOSEC) 20 MG capsule Take 20 mg by mouth as needed.       . warfarin (COUMADIN) 2.5 MG tablet Take 2.5 mg by mouth as directed. Saturday-Sunday.      . warfarin (COUMADIN) 5 MG tablet Take 5 mg by mouth as directed. Mon-Friday.      Marland Kitchen  DISCONTD: metoprolol tartrate (LOPRESSOR) 25 MG tablet Take 25 mg by mouth 2 (two) times daily.      Marland Kitchen DISCONTD: warfarin (COUMADIN) 1 MG tablet Take 1 mg by mouth as directed.      Marland Kitchen DISCONTD: warfarin (COUMADIN) 4 MG tablet Take 4 mg by mouth as directed.         Review of Systems  HENT: Negative.   Eyes: Negative.   Respiratory: Negative.   Cardiovascular: Positive for leg swelling.  Gastrointestinal: Negative.   Musculoskeletal: Negative.   Skin: Negative.   Hematological: Negative.   All other systems reviewed and are negative.    BP 120/58  Pulse 71  Ht 5\' 2"  (1.575 m)  Wt 151 lb 8 oz (68.72 kg)  BMI 27.71 kg/m2  Physical Exam  Nursing note and vitals reviewed. Constitutional: She is oriented to person, place, and time. She appears well-developed and  well-nourished.  HENT:  Head: Normocephalic.  Nose: Nose normal.  Mouth/Throat: Oropharynx is clear and moist.  Eyes: Conjunctivae normal are normal. Pupils are equal, round, and reactive to light.  Neck: Normal range of motion. Neck supple. No JVD present.  Cardiovascular: Normal rate, regular rhythm, S1 normal, S2 normal, normal heart sounds and intact distal pulses.  Exam reveals no gallop and no friction rub.   No murmur heard.      1+ pitting edema to below the knees bilaterally  Pulmonary/Chest: Effort normal and breath sounds normal. No respiratory distress. She has no wheezes. She has no rales. She exhibits no tenderness.  Abdominal: Soft. Bowel sounds are normal. She exhibits no distension. There is no tenderness.  Musculoskeletal: Normal range of motion. She exhibits no edema and no tenderness.  Lymphadenopathy:    She has no cervical adenopathy.  Neurological: She is alert and oriented to person, place, and time. Coordination normal.  Skin: Skin is warm and dry. No rash noted. No erythema.  Psychiatric: She has a normal mood and affect. Her behavior is normal. Judgment and thought content normal.         Assessment and Plan

## 2012-05-05 ENCOUNTER — Encounter: Payer: Self-pay | Admitting: Cardiovascular Disease

## 2012-05-05 ENCOUNTER — Ambulatory Visit (INDEPENDENT_AMBULATORY_CARE_PROVIDER_SITE_OTHER): Payer: Medicare HMO | Admitting: Cardiovascular Disease

## 2012-05-05 VITALS — BP 130/60 | HR 73 | Ht 62.0 in | Wt 149.3 lb

## 2012-05-05 DIAGNOSIS — I4891 Unspecified atrial fibrillation: Secondary | ICD-10-CM

## 2012-05-05 DIAGNOSIS — R609 Edema, unspecified: Secondary | ICD-10-CM

## 2012-05-05 DIAGNOSIS — R0602 Shortness of breath: Secondary | ICD-10-CM

## 2012-05-05 DIAGNOSIS — I10 Essential (primary) hypertension: Secondary | ICD-10-CM

## 2012-05-05 MED ORDER — METOPROLOL TARTRATE 50 MG PO TABS: 50.0000 mg | ORAL_TABLET | Freq: Two times a day (BID) | ORAL | Status: DC

## 2012-05-05 NOTE — Patient Instructions (Addendum)
Hold the diltiazem  Increase the metoprolol to 50 mg twice a day Continue amiodarone 200 mg once a day  Do not drink too much fluids. Ok to take extra lasix after lunch if needed for worsening edema, take with extra potassium  Please call us if you have new issues that need to be addressed before your next appt.  Your physician wants you to follow-up in: 2 to 3 weeks

## 2012-05-05 NOTE — Assessment & Plan Note (Signed)
Currently on warfarin, rate controlled. She has followup with Dr. Graciela Husbands in February 2014

## 2012-05-05 NOTE — Assessment & Plan Note (Signed)
Her biggest complaint is her edema. We will hold the diltiazem in an effort to improve her symptoms. Increase metoprolol to 50 mg twice a day for rate and rhythm control. Continue low-dose amiodarone.

## 2012-05-05 NOTE — Progress Notes (Signed)
Patient ID: Kathryn Short, female    DOB: 07/26/1921, 78 y.o.   MRN: [id number]  HPI Comments: Kathryn Short is an 78 year old woman with paroxysmal atrial fibrillation, sick sinus syndrome, pacemaker placement, Followed by Dr. Graciela Husbands, in normal sinus rhythm at that time who presented to the hospital [facility] on 02/16/2012 with dizziness and malaise over one to 2 weeks, heart rate up to 150 beats per minute in atrial fibrillation.  She was started on metoprolol and amiodarone, diltiazem was continued at 180 mg daily (she did not start 120 mg daily dose as recommended by Dr. Graciela Husbands). Heart rate improved and she was discharged home.   She has since been back to the emergency room with chest pain, dizziness, malaise. Feelings of insomnia, and anxious feeling, chest pain episodes, weakness, nausea, dizziness. She has obstructive sleep apnea and does not use her BiPAP at nighttime  On her last clinic visit and again today, she reports continued significant lower extremity edema. She is taking Lasix 40 mg daily. By her history, she does drink significant fluids during the day. Blood pressure has been well-controlled, heart rate typically 60-70. On her last clinic visit, diltiazem dose was decreased to 120 mg daily, metoprolol to 25 mg twice a day.   Prior to Echocardiogram in the hospital showed ejection fraction 50-55%, moderate LVH, mild to moderate TR, right ventricular systolic pressure 40 mm of mercury, mild to moderate aortic valve insufficiency TSH in the hospital 4.76  EKG today shows intermittently paced rhythm, suspect underlying atrial fibrillation, rate 73 beats per minute       Outpatient Encounter Prescriptions as of 05/05/2012  Medication Sig Dispense Refill  . acetaminophen (TYLENOL) 500 MG tablet Takes 2-4 tablets daily.      Marland Kitchen amiodarone (PACERONE) 200 MG tablet Take 200 mg by mouth daily.       Marland Kitchen b complex vitamins tablet Take 1 tablet by mouth daily.      . Cyanocobalamin (VITAMIN  B-12) 1000 MCG SUBL Place 1,000 mcg under the tongue every 30 (thirty) days.        Marland Kitchen dicyclomine (BENTYL) 10 MG capsule Take 10 mg by mouth 4 (four) times daily -  before meals and at bedtime. As needed      . docusate sodium (COLACE) 100 MG capsule Take 100 mg by mouth 2 (two) times daily.       . furosemide (LASIX) 40 MG tablet Take 1 tablet (40 mg total) by mouth daily.  90 tablet  3  . HYDROcodone-acetaminophen (VICODIN) 5-500 MG per tablet Take 1/2 tablet daily.      Marland Kitchen levothyroxine (SYNTHROID, LEVOTHROID) 50 MCG tablet Take 50 mcg by mouth daily.       . Multiple Vitamin (MULTIVITAMIN) tablet Take 1 tablet by mouth daily.      . nitroGLYCERIN (NITROSTAT) 0.4 MG SL tablet Place 1 tablet (0.4 mg total) under the tongue every 5 (five) minutes as needed for chest pain.  25 tablet  3  . omeprazole (PRILOSEC) 20 MG capsule Take 20 mg by mouth as needed.       . potassium chloride (K-DUR) 10 MEQ tablet Take 1 tablet (10 mEq total) by mouth daily.  90 tablet  3  . warfarin (COUMADIN) 2.5 MG tablet Take 2.5 mg by mouth as directed. Saturday-Sunday.      . warfarin (COUMADIN) 5 MG tablet Take 5 mg by mouth as directed. Mon-Friday.      . diltiazem (CARDIZEM CD) 120 MG 24  hr capsule Take 1 capsule (120 mg total) by mouth daily.  90 capsule  3  .  metoprolol tartrate (LOPRESSOR) 25 MG tablet Take 1 tablet (25 mg total) by mouth 2 (two) times daily.  180 tablet  3     Review of Systems  HENT: Negative.   Eyes: Negative.   Respiratory: Negative.   Cardiovascular: Positive for leg swelling.  Gastrointestinal: Negative.   Musculoskeletal: Negative.   Skin: Negative.   Hematological: Negative.   All other systems reviewed and are negative.    BP 130/60  Pulse 73  Ht 5\' 2"  (1.575 m)  Wt 149 lb 5 oz (67.728 kg)  BMI 27.31 kg/m2  Physical Exam  Nursing note and vitals reviewed. Constitutional: She is oriented to person, place, and time. She appears well-developed and well-nourished.  HENT:    Head: Normocephalic.  Nose: Nose normal.  Mouth/Throat: Oropharynx is clear and moist.  Eyes: Conjunctivae normal are normal. Pupils are equal, round, and reactive to light.  Neck: Normal range of motion. Neck supple. No JVD present.  Cardiovascular: Normal rate, regular rhythm, S1 normal, S2 normal, normal heart sounds and intact distal pulses.  Exam reveals no gallop and no friction rub.   No murmur heard.      1+ pitting edema to below the knees bilaterally  Pulmonary/Chest: Effort normal and breath sounds normal. No respiratory distress. She has no wheezes. She has no rales. She exhibits no tenderness.  Abdominal: Soft. Bowel sounds are normal. She exhibits no distension. There is no tenderness.  Musculoskeletal: Normal range of motion. She exhibits no edema and no tenderness.  Lymphadenopathy:    She has no cervical adenopathy.  Neurological: She is alert and oriented to person, place, and time. Coordination normal.  Skin: Skin is warm and dry. No rash noted. No erythema.  Psychiatric: She has a normal mood and affect. Her behavior is normal. Judgment and thought content normal.         Assessment and Plan

## 2012-05-05 NOTE — Assessment & Plan Note (Signed)
Changes as mentioned above. We'll continue to monitor her blood pressure.

## 2012-05-07 ENCOUNTER — Ambulatory Visit: Payer: Medicare HMO | Admitting: Cardiovascular Disease

## 2012-05-20 ENCOUNTER — Telehealth: Payer: Self-pay | Admitting: Cardiovascular Disease

## 2012-05-20 ENCOUNTER — Encounter: Payer: Self-pay | Admitting: Cardiovascular Disease

## 2012-05-20 ENCOUNTER — Ambulatory Visit (INDEPENDENT_AMBULATORY_CARE_PROVIDER_SITE_OTHER): Payer: Medicare HMO | Admitting: Cardiovascular Disease

## 2012-05-20 VITALS — BP 140/60 | HR 79 | Ht 62.0 in | Wt 147.8 lb

## 2012-05-20 DIAGNOSIS — I5033 Acute on chronic diastolic (congestive) heart failure: Secondary | ICD-10-CM

## 2012-05-20 DIAGNOSIS — I509 Heart failure, unspecified: Secondary | ICD-10-CM

## 2012-05-20 DIAGNOSIS — R0602 Shortness of breath: Secondary | ICD-10-CM

## 2012-05-20 DIAGNOSIS — I4891 Unspecified atrial fibrillation: Secondary | ICD-10-CM

## 2012-05-20 DIAGNOSIS — R079 Chest pain, unspecified: Secondary | ICD-10-CM

## 2012-05-20 DIAGNOSIS — R609 Edema, unspecified: Secondary | ICD-10-CM

## 2012-05-20 MED ORDER — POTASSIUM CHLORIDE ER 10 MEQ PO TBCR: 10.0000 meq | EXTENDED_RELEASE_TABLET | Freq: Every day | ORAL | Status: DC

## 2012-05-20 MED ORDER — TORSEMIDE 20 MG PO TABS: 20.0000 mg | ORAL_TABLET | Freq: Two times a day (BID) | ORAL | Status: DC

## 2012-05-20 MED ORDER — NITROGLYCERIN 0.4 MG SL SUBL: 0.4000 mg | SUBLINGUAL_TABLET | SUBLINGUAL | Status: DC | PRN

## 2012-05-20 NOTE — Patient Instructions (Addendum)
You are doing well. Hold the lasix/furosemide Start torsemide twice a day with potassium twice a day  Please call us if you have new issues that need to be addressed before your next appt.  Your physician wants you to follow-up in: 2 weeks

## 2012-05-20 NOTE — Progress Notes (Signed)
Patient ID: Kathryn Short, female    DOB: 05-17-1922, 78 y.o.   MRN: [id number]  HPI Comments: Kathryn Short is an 78 year old woman with paroxysmal atrial fibrillation, sick sinus syndrome, pacemaker placement followed by Dr. Graciela Husbands, in normal sinus rhythm earlier this year who presented to the hospital [facility] on 02/16/2012 with dizziness and malaise over one to 2 weeks, heart rate up to 150 beats per minute in atrial fibrillation.  She was started on metoprolol and amiodarone, diltiazem was continued at 180 mg daily (she did not start 120 mg daily dose as recommended by Dr. Graciela Husbands). Heart rate improved and she was discharged home.   She has since been back to the emergency room with chest pain, dizziness, malaise. Feelings of insomnia, and anxious feeling, chest pain episodes, weakness, nausea, dizziness. She has obstructive sleep apnea and does not use her BiPAP at nighttime  On her last several office visits, she reported significant lower extremity edema. She was taking Lasix 40 mg daily. We increased this to twice a day dosing . By her history, she does drink significant fluids during the day. On last clinic visit, diltiazem was held for large common edema and metoprolol was increased to 50 mg twice a day. Daughter presents with her today and reports heart rate has been well-controlled without diltiazem. Edema did improve without diltiazem and high-dose Lasix, worse over the past several days. She has not been taking Lasix on a high or regular basis as it caused some bladder symptoms. Notes indicate on the Lasix 20 mg daily.   Over the past several days she has had worsening chest congestion, cough, shortness of breath, a feeling of smothering when she lies down. Edema is worse. Daughter reports she is having some chest tightness and gave her nitroglycerin for chest pain symptoms.  Prior to Echocardiogram in the hospital showed ejection fraction 50-55%, moderate LVH, mild to moderate TR, right  ventricular systolic pressure 40 mm of mercury, mild to moderate aortic valve insufficiency TSH in the hospital 4.76  EKG today shows atrial fibrillation with rate 79 beats per minute, rare paced rhythm     Outpatient Encounter Prescriptions as of 05/20/2012  Medication Sig Dispense Refill  . acetaminophen (TYLENOL) 500 MG tablet Takes 2-4 tablets daily.      Marland Kitchen amiodarone (PACERONE) 200 MG tablet Take 200 mg by mouth daily.       Marland Kitchen b complex vitamins tablet Take 1 tablet by mouth daily.      . Cyanocobalamin (VITAMIN B-12) 1000 MCG SUBL Place 1,000 mcg under the tongue every 30 (thirty) days.        Marland Kitchen dicyclomine (BENTYL) 10 MG capsule Take 10 mg by mouth 4 (four) times daily -  before meals and at bedtime. As needed      . docusate sodium (COLACE) 100 MG capsule Take 100 mg by mouth 2 (two) times daily.       . furosemide (LASIX) 40 MG tablet Take 40 mg twice a day (Only taking one pill daily because of bladder symptoms )      . HYDROcodone-acetaminophen (VICODIN) 5-500 MG per tablet Take 1/2 tablet daily.      Marland Kitchen levothyroxine (SYNTHROID, LEVOTHROID) 50 MCG tablet Take 50 mcg by mouth daily.       . metoprolol tartrate (LOPRESSOR) 50 MG tablet Take 1 tablet (50 mg total) by mouth 2 (two) times daily.  180 tablet  3  . Multiple Vitamin (MULTIVITAMIN) tablet Take 1 tablet by mouth  daily.      . nitroGLYCERIN (NITROSTAT) 0.4 MG SL tablet Place 1 tablet (0.4 mg total) under the tongue every 5 (five) minutes as needed for chest pain.  25 tablet  11  . omeprazole (PRILOSEC) 20 MG capsule Take 20 mg by mouth as needed.       . potassium chloride (K-DUR) 10 MEQ tablet Take 1 tablet (10 mEq total) by mouth daily.  90 tablet  3  . warfarin (COUMADIN) 2.5 MG tablet Take 2.5 mg by mouth as directed. Saturday-Sunday.      . warfarin (COUMADIN) 5 MG tablet Take 5 mg by mouth as directed. Mon-Friday.          Review of Systems  Constitutional: Negative.   HENT: Negative.   Eyes: Negative.    Respiratory: Positive for cough, chest tightness and shortness of breath.   Cardiovascular: Positive for leg swelling.  Gastrointestinal: Negative.   Musculoskeletal: Negative.   Skin: Negative.   Neurological: Negative.   Hematological: Negative.   Psychiatric/Behavioral: Negative.   All other systems reviewed and are negative.    BP 140/60  Pulse 79  Ht 5\' 2"  (1.575 m)  Wt 147 lb 12 oz (67.019 kg)  BMI 27.02 kg/m2  SpO2 98%  Physical Exam  Nursing note and vitals reviewed. Constitutional: She is oriented to person, place, and time. She appears well-developed and well-nourished.  HENT:  Head: Normocephalic.  Nose: Nose normal.  Mouth/Throat: Oropharynx is clear and moist.  Eyes: Conjunctivae normal are normal. Pupils are equal, round, and reactive to light.  Neck: Normal range of motion. Neck supple. No JVD present.  Cardiovascular: Normal rate, regular rhythm, S1 normal, S2 normal, normal heart sounds and intact distal pulses.  Exam reveals no gallop and no friction rub.   No murmur heard.      1 to 2 + pitting edema to below the knees bilaterally  Pulmonary/Chest: Effort normal and breath sounds normal. No respiratory distress. She has no wheezes. She has no rales. She exhibits no tenderness.  Abdominal: Soft. Bowel sounds are normal. She exhibits no distension. There is no tenderness.  Musculoskeletal: Normal range of motion. She exhibits no edema and no tenderness.  Lymphadenopathy:    She has no cervical adenopathy.  Neurological: She is alert and oriented to person, place, and time. Coordination normal.  Skin: Skin is warm and dry. No rash noted. No erythema.  Psychiatric: She has a normal mood and affect. Her behavior is normal. Judgment and thought content normal.         Assessment and Plan

## 2012-05-20 NOTE — Assessment & Plan Note (Signed)
Chest pain symptoms concerning for diastolic CHF. We have asked the daughter to contact us if chest pain symptoms persist. If symptoms persist, might need ischemia workup. Prior cardiac catheterization many years ago showing no significant CAD.

## 2012-05-20 NOTE — Assessment & Plan Note (Signed)
Heart rate is improved. She continues to have diastolic CHF symptoms.. continue metoprolol.

## 2012-05-20 NOTE — Assessment & Plan Note (Signed)
She is not tolerating Lasix well and is not taking very much because of bladder symptoms. She denies it on today's visit but her daughter reports it has been a problem all week. With less diuretic, she has had worsening edema and heart failure symptoms. We will change Lasix to torsemide 20 mg twice a day. Encouraged fluid restriction, close monitoring of her weight.

## 2012-05-20 NOTE — Assessment & Plan Note (Signed)
Daughter reports edema did improve significantly by holding diltiazem, worse in the past several days with less Lasix suggestive of systolic CHF.

## 2012-05-20 NOTE — Telephone Encounter (Signed)
Pt's dtr says pt awoke yesterday am at 0300 with c/o CP and BP=174/95, HR=77 BPM. Pt took NTG SL x 2 and pain resolved Chest pain remained intermittent throughout day yesterday Last night at 9:15 pm pt c/o more cp and sob, took NTG SL x 1 with resolution of pain At 0030 this am, pt awoke with dyspnea and CP, took NTG SL x3 , pain resolved BP was 170/101, HR=88 BPM Main complaint at this time is sob Says pt has appt with Dr. Mariah Milling tomm and is unsure if pt should wait until tomm Concerned about having "another bad night" I offered to work pt in with Dr. Kirke Corin, but dtr declines, says pt would want to see Dr. Elzie Rings I advised dtr to go ahead and bring pt in since he has opening at 1100 this am Understanding verb

## 2012-05-20 NOTE — Telephone Encounter (Signed)
Pt daughter would like to speak nurse. Pt has been having some SOB, CP, elevated pulse, took 2 nitro together yesterday around 3;00 and last night took one then she has taken 3 this am

## 2012-05-21 ENCOUNTER — Ambulatory Visit: Payer: Medicare HMO | Admitting: Cardiovascular Disease

## 2012-05-23 ENCOUNTER — Telehealth: Payer: Self-pay

## 2012-05-23 NOTE — Telephone Encounter (Signed)
pts dtr called to say pts edema is much improved after starting torsemide and KCL BID (as instructed at last OV). Denies worsening sob or CP.  In fact, dtr says pt had had no further CP since last OV. Dtr is concerned b/c pt has been having some dizziness and low BPs (109/68, 107/56). Says she made pt lay supine and BP came up to 130/67, 122/69, 119/62 Pt has not been weighing herself daily I advised dtr to have pt decrease torsemide to 1 tablet qd and kcl daily i encouraged dtr to make sure pt weighs daily, same time qd, and may take extra torsemide if weight up 2-3 pounds overnight She should also hold torsemide should weight be down 2-3 pounds overnight She verb. Understanding and will call us over w/e should she have any questions/concerns

## 2012-05-23 NOTE — Telephone Encounter (Signed)
Message copied by Marcelle Overlie on Fri May 23, 2012  9:27 AM ------      Message from: [facility], Ruthanne Mcneish E      Created: Wed May 21, 2012 10:49 AM      Regarding: assess       Call to assess

## 2012-05-23 NOTE — Telephone Encounter (Signed)
fyi  See below

## 2012-06-03 ENCOUNTER — Ambulatory Visit (INDEPENDENT_AMBULATORY_CARE_PROVIDER_SITE_OTHER): Payer: Medicare HMO | Admitting: Cardiovascular Disease

## 2012-06-03 ENCOUNTER — Encounter: Payer: Self-pay | Admitting: Cardiovascular Disease

## 2012-06-03 VITALS — BP 120/72 | HR 72 | Ht 62.0 in | Wt 146.2 lb

## 2012-06-03 DIAGNOSIS — I10 Essential (primary) hypertension: Secondary | ICD-10-CM

## 2012-06-03 DIAGNOSIS — I5033 Acute on chronic diastolic (congestive) heart failure: Secondary | ICD-10-CM

## 2012-06-03 DIAGNOSIS — I509 Heart failure, unspecified: Secondary | ICD-10-CM

## 2012-06-03 DIAGNOSIS — R079 Chest pain, unspecified: Secondary | ICD-10-CM

## 2012-06-03 DIAGNOSIS — R609 Edema, unspecified: Secondary | ICD-10-CM

## 2012-06-03 NOTE — Assessment & Plan Note (Signed)
Improved edema without calcium channel blocker and with better diuresis. Trace edema noted.

## 2012-06-03 NOTE — Progress Notes (Signed)
Patient ID: Kathryn Short, female    DOB: 1921-10-17, 78 y.o.   MRN: [id number]  HPI Comments: Kathryn Short is an 78 year old woman with paroxysmal atrial fibrillation, sick sinus syndrome, pacemaker placement followed by Dr. Graciela Husbands, in normal sinus rhythm earlier this year who presented to the hospital [facility] on 02/16/2012 with dizziness and malaise over one to 2 weeks, heart rate up to 150 beats per minute in atrial fibrillation.  She was started on metoprolol and amiodarone, diltiazem was continued at 180 mg daily (she did not start 120 mg daily dose as recommended by Dr. Graciela Husbands). Heart rate improved and she was discharged home.   She has since been back to the emergency room with chest pain, dizziness, malaise. Feelings of insomnia, and anxious feeling, chest pain episodes, weakness, nausea, dizziness. She has obstructive sleep apnea and does not use her BiPAP at nighttime  On her last several office visits, she reported significant lower extremity edema. She was taking Lasix 40 mg daily. We increased this to twice a day dosing . By her history, she does drink significant fluids during the day. On previous visit, diltiazem was held for edema and metoprolol was increased to 50 mg twice a day.   Lasix was changed to torsemide for worsening cough, shortness of breath and edema. Her daughter reports that she is taking one half torsemide daily, occasionally 1 full tab for any weight gain. Weight has fluctuated between 140 and up to 146 pounds today. Patient does report having shortness of breath at nighttime. She does have rare low blood pressures, heart rate typically in the 60s to 80 range, blood pressure has otherwise been well controlled. She has not been taking her potassium on a consistent basis it is several days ago, she had a stomach ache when she woke up. Her daughter attributed the stomach ache to potassium.  Prior to Echocardiogram in the hospital showed ejection fraction 50-55%, moderate LVH,  mild to moderate TR, right ventricular systolic pressure 40 mm of mercury, mild to moderate aortic valve insufficiency TSH in the hospital 4.76  EKG today shows paced rhythm at 72 beats per minute, underlying atrial fibrillation    Outpatient Encounter Prescriptions as of 06/03/2012  Medication Sig Dispense Refill  . acetaminophen (TYLENOL) 500 MG tablet Takes 2-4 tablets daily.      Marland Kitchen amiodarone (PACERONE) 200 MG tablet Take 200 mg by mouth daily.       Marland Kitchen b complex vitamins tablet Take 1 tablet by mouth daily.      . Cyanocobalamin (VITAMIN B-12) 1000 MCG SUBL Place 1,000 mcg under the tongue every 30 (thirty) days.        Marland Kitchen dicyclomine (BENTYL) 10 MG capsule Take 10 mg by mouth daily. As needed      . docusate sodium (COLACE) 100 MG capsule Take 100 mg by mouth 2 (two) times daily.       Marland Kitchen HYDROcodone-acetaminophen (VICODIN) 5-500 MG per tablet Take 1/2 tablet daily.      Marland Kitchen levothyroxine (SYNTHROID, LEVOTHROID) 50 MCG tablet Take 50 mcg by mouth daily.       . metoprolol tartrate (LOPRESSOR) 50 MG tablet Take 1 tablet (50 mg total) by mouth 2 (two) times daily.  180 tablet  3  . Multiple Vitamin (MULTIVITAMIN) tablet Take 1 tablet by mouth daily.      . nitroGLYCERIN (NITROSTAT) 0.4 MG SL tablet Place 1 tablet (0.4 mg total) under the tongue every 5 (five) minutes as needed for chest  pain.  25 tablet  11  . omeprazole (PRILOSEC) 20 MG capsule Take 20 mg by mouth as needed.       . torsemide (DEMADEX) 20 MG tablet Take 1/2 tablet if no weight gain. Take one tablet if 2 lbs or more weight gain.      . potassium chloride (K-DUR) 10 MEQ tablet Take 1 tablet (10 mEq total) by mouth daily.  90 tablet  3     Review of Systems  Constitutional: Negative.   HENT: Negative.   Eyes: Negative.   Respiratory: Positive for shortness of breath.   Cardiovascular: Positive for leg swelling.  Gastrointestinal: Negative.   Musculoskeletal: Negative.   Skin: Negative.   Neurological: Negative.    Hematological: Negative.   Psychiatric/Behavioral: Negative.   All other systems reviewed and are negative.    BP 120/72  Pulse 72  Ht 5\' 2"  (1.575 m)  Wt 146 lb 4 oz (66.339 kg)  BMI 26.75 kg/m2  Physical Exam  Nursing note and vitals reviewed. Constitutional: She is oriented to person, place, and time. She appears well-developed and well-nourished.  HENT:  Head: Normocephalic.  Nose: Nose normal.  Mouth/Throat: Oropharynx is clear and moist.  Eyes: Conjunctivae normal are normal. Pupils are equal, round, and reactive to light.  Neck: Normal range of motion. Neck supple. No JVD present.  Cardiovascular: Normal rate, regular rhythm, S1 normal, S2 normal, normal heart sounds and intact distal pulses.  Exam reveals no gallop and no friction rub.   No murmur heard.      Trace pitting edema to below the knees bilaterally  Pulmonary/Chest: Effort normal and breath sounds normal. No respiratory distress. She has no wheezes. She has no rales. She exhibits no tenderness.  Abdominal: Soft. Bowel sounds are normal. She exhibits no distension. There is no tenderness.  Musculoskeletal: Normal range of motion. She exhibits no edema and no tenderness.  Lymphadenopathy:    She has no cervical adenopathy.  Neurological: She is alert and oriented to person, place, and time. Coordination normal.  Skin: Skin is warm and dry. No rash noted. No erythema.  Psychiatric: She has a normal mood and affect. Her behavior is normal. Judgment and thought content normal.         Assessment and Plan

## 2012-06-03 NOTE — Assessment & Plan Note (Signed)
Blood pressure is well controlled on today's visit. No changes made to the medications. 

## 2012-06-03 NOTE — Patient Instructions (Addendum)
You are doing well. No medication changes were made.  Please keep weight around 142 to 144 Take extra torsemide for weight gain  We will check BMP today  Please call us if you have new issues that need to be addressed before your next appt.  Your physician wants you to follow-up in: 1 months.

## 2012-06-03 NOTE — Assessment & Plan Note (Signed)
We have recommended that she take torsemide daily. Goal weight 142-144 pounds. We have also suggested she take potassium daily.

## 2012-06-04 LAB — BASIC METABOLIC PANEL
Calcium: 8.6 mg/dL (ref 8.6–10.2)
GFR calc non Af Amer: 48 mL/min/{1.73_m2} — ABNORMAL LOW (ref >59)
Glucose: 97 mg/dL (ref 65–99)
Potassium: 4.9 mmol/L (ref 3.5–5.2)

## 2012-06-05 ENCOUNTER — Other Ambulatory Visit: Payer: Self-pay

## 2012-06-05 ENCOUNTER — Telehealth: Payer: Self-pay

## 2012-06-05 DIAGNOSIS — I509 Heart failure, unspecified: Secondary | ICD-10-CM

## 2012-06-05 NOTE — Telephone Encounter (Signed)
Order placed for home health Message sent to [facility] rep, Henderson Newcomer, RN

## 2012-06-05 NOTE — Telephone Encounter (Signed)
Message copied by Marcelle Overlie on Thu Jun 05, 2012  8:42 AM ------      Message from: Festus Aloe      Created: Tue Jun 03, 2012 11:24 AM       Dr. Mariah Milling would like her to be set up for [facility] for a home nurse. Please contact patient when this is complete.

## 2012-06-12 ENCOUNTER — Telehealth: Payer: Self-pay

## 2012-06-12 NOTE — Telephone Encounter (Signed)
Pt dropped of a letter for me, questioning dose of amiodarone. Wanted to clarify correct dose (qd versus BID).  Per Dr. Windell Hummingbird most recent note, she should be taking amiodarone 200 mg daily.  I attempted to call pt back to explain, but there was no answer I will continue to try to reach pt

## 2012-06-13 ENCOUNTER — Telehealth: Payer: Self-pay

## 2012-06-13 NOTE — Telephone Encounter (Signed)
Received message from [facility] with [facility] to call her back at [phone] [facility]

## 2012-06-13 NOTE — Telephone Encounter (Signed)
Received correspondence from [facility] stating OT will take place 06/16/12

## 2012-06-13 NOTE — Telephone Encounter (Signed)
Pt's dtr, ann, informed of correct amiodarone dose She verb understanding and confirms this is the dose pt has been receiving When she had RX refilled it had original prescription on bottle (BID) and just wanted to clarify  She also mentions pt is receiving [facility] HH nurses coming to home She wonders if there is a more long term option since she understands this is just short term I told her I would find out from [facility]  She also mentions she was told by [facility]  Nurse that pt would be receiving PT and OT but has not had any response about this I reassured her I would find out about this as well Understanding verb

## 2012-06-13 NOTE — Telephone Encounter (Signed)
Betsy with [facility] called back re: pt's question re:PT and OT and long term nursing care (see telephone note from todaty 1/3) Says PT is to start tomm 1/4 and OT to begin Tuesday 1/7 Says she sent correspondence to nursing team [id number] for long term care and will probably be asking for an extension to home care I will call Ann to explain

## 2012-06-13 NOTE — Telephone Encounter (Signed)
See below . Thanks

## 2012-06-13 NOTE — Telephone Encounter (Signed)
Ann, pt's dtr, informed Understanding verb Says PT is actually at home now

## 2012-06-16 ENCOUNTER — Telehealth: Payer: Self-pay

## 2012-06-16 NOTE — Telephone Encounter (Signed)
Patient having shortness of breath, LE swelling and dizziness. She went to her PCP today at [facility] clinic with symptoms and Dr. Roderic Palau did some blood work.

## 2012-06-16 NOTE — Telephone Encounter (Signed)
lmtcb

## 2012-06-17 ENCOUNTER — Encounter: Payer: Self-pay | Admitting: Internal Medicine

## 2012-06-17 ENCOUNTER — Ambulatory Visit (INDEPENDENT_AMBULATORY_CARE_PROVIDER_SITE_OTHER): Payer: Medicare HMO | Admitting: *Deleted

## 2012-06-17 VITALS — BP 120/70 | HR 76 | Ht 62.0 in | Wt 145.8 lb

## 2012-06-17 DIAGNOSIS — I509 Heart failure, unspecified: Secondary | ICD-10-CM

## 2012-06-17 DIAGNOSIS — I4891 Unspecified atrial fibrillation: Secondary | ICD-10-CM

## 2012-06-17 DIAGNOSIS — I5033 Acute on chronic diastolic (congestive) heart failure: Secondary | ICD-10-CM

## 2012-06-17 NOTE — Telephone Encounter (Signed)
LMTCB Will try other #

## 2012-06-17 NOTE — Telephone Encounter (Signed)
I spoke with Kathryn Short, pt's dtr. She says pt has had 2 "severe" dizzy spells in the last 2 days She went to PCP yesterday for labs and U/A They called her with results and was told she may be "slightly dehydrated" but not enough to make any changes Was also told she had UTI but dtr says she "gets those all the time" dtr is concerned b/c these spells occur while pt is sitting and are so severe pt is afraid to stand BP=152/87, 156/98 I am hesitant to advise her to decrease metoprolol since BP is elevated Due for PPM check in Feb Does not have home PPM box to send in transmission I had Ann hold while I called Gunnar Fusi in device clinic in [place] She says we can have pt come in for PPM check this afternoon to make sure dizziness is not secondary to PPM issue Kathryn Short was informed and appt made for this pm at 2:30

## 2012-06-17 NOTE — Progress Notes (Signed)
PPM check 

## 2012-06-17 NOTE — Progress Notes (Signed)
Pt mentions she is having to use SL NTG nightly for chest pressure and SOB. Says 1 tablet usually relieves symptoms but sometimes has to use 2.  I discussed this with Dr. Mariah Milling who suggests pt come back tomm to see him in office to discuss options (i.e cath, medical management, etc) Pt verb. Understanding and appt was made for tomm.

## 2012-06-18 ENCOUNTER — Encounter: Payer: Self-pay | Admitting: Cardiovascular Disease

## 2012-06-18 ENCOUNTER — Ambulatory Visit (INDEPENDENT_AMBULATORY_CARE_PROVIDER_SITE_OTHER): Payer: Medicare HMO | Admitting: Cardiovascular Disease

## 2012-06-18 VITALS — BP 132/62 | HR 74 | Ht 62.0 in | Wt 145.5 lb

## 2012-06-18 DIAGNOSIS — R0602 Shortness of breath: Secondary | ICD-10-CM

## 2012-06-18 DIAGNOSIS — R5381 Other malaise: Secondary | ICD-10-CM

## 2012-06-18 DIAGNOSIS — I4891 Unspecified atrial fibrillation: Secondary | ICD-10-CM

## 2012-06-18 DIAGNOSIS — R531 Weakness: Secondary | ICD-10-CM

## 2012-06-18 DIAGNOSIS — R079 Chest pain, unspecified: Secondary | ICD-10-CM

## 2012-06-18 DIAGNOSIS — I10 Essential (primary) hypertension: Secondary | ICD-10-CM

## 2012-06-18 DIAGNOSIS — R609 Edema, unspecified: Secondary | ICD-10-CM

## 2012-06-18 DIAGNOSIS — R5383 Other fatigue: Secondary | ICD-10-CM

## 2012-06-18 MED ORDER — ISOSORBIDE MONONITRATE ER 30 MG PO TB24: 30.0000 mg | ORAL_TABLET | Freq: Two times a day (BID) | ORAL | Status: DC | PRN

## 2012-06-18 NOTE — Assessment & Plan Note (Signed)
She does have significant leg weakness. She's not exercising on regular basis.

## 2012-06-18 NOTE — Assessment & Plan Note (Signed)
Blood pressure is mildly elevated on numbers provided today with systolic pressures frequently in the 140-150 range. We'll start isosorbide mononitrate at dinnertime in an effort to improve her chest pain symptoms. Currently she has relief with nitroglycerin.

## 2012-06-18 NOTE — Progress Notes (Signed)
Patient ID: Kathryn Short, female    DOB: July 31, 1921, 79 y.o.   MRN: [id number]  HPI Comments: Kathryn Short is an 79 year old woman with paroxysmal atrial fibrillation, sick sinus syndrome, pacemaker placement followed by Dr. Graciela Husbands, in normal sinus rhythm earlier this year who presented to the hospital [facility] on 02/16/2012 with dizziness and malaise over one to 2 weeks, heart rate up to 150 beats per minute in atrial fibrillation.  She was started on metoprolol and amiodarone, diltiazem was continued at 180 mg daily (she did not start 120 mg daily dose as recommended by Dr. Graciela Husbands). Heart rate improved and she was discharged home.   She has since been back to the emergency room with chest pain, dizziness, malaise. Feelings of insomnia, and anxious feeling, chest pain episodes, weakness, nausea, dizziness. She has obstructive sleep apnea and does not use her BiPAP at nighttime  On her last several office visits, she reported significant lower extremity edema. She was taking Lasix 40 mg daily. We increased this to twice a day dosing .   diltiazem was held for edema and metoprolol was increased to 50 mg twice a day.   Lasix was changed to torsemide for worsening cough, shortness of breath and edema. Her daughter reports that she is taking one half torsemide daily, occasionally 1 full tab for any weight gain. Weight has fluctuated between 141 and in the  to 146 pounds.   She continues to side she is and left upper epigastric pain typically at nighttime. In the past she felt as was potassium and they were no longer taking potassium. Now they report they're taking nitroglycerin for this left side chest, abdominal pain with improvement of the symptoms after taking nitroglycerin. Symptoms typically occur at 3 in the morning and wake her from sleep. No significant symptoms with activity and typically no symptoms during the daytime. Occasional chest pain in the morning in a different location. Some shortness of  breath at nighttime, occasionally in the day.  Prior to Echocardiogram in the hospital showed ejection fraction 50-55%, moderate LVH, mild to moderate TR, right ventricular systolic pressure 40 mm of mercury, mild to moderate aortic valve insufficiency TSH in the hospital 4.76  EKG today shows paced rhythm at 74 beats per minute, underlying atrial fibrillation    Outpatient Encounter Prescriptions as of 06/03/2012  Medication Sig Dispense Refill  . acetaminophen (TYLENOL) 500 MG tablet Takes 2-4 tablets daily.      Marland Kitchen amiodarone (PACERONE) 200 MG tablet Take 200 mg by mouth daily.       Marland Kitchen b complex vitamins tablet Take 1 tablet by mouth daily.      . Cyanocobalamin (VITAMIN B-12) 1000 MCG SUBL Place 1,000 mcg under the tongue every 30 (thirty) days.        Marland Kitchen dicyclomine (BENTYL) 10 MG capsule Take 10 mg by mouth daily. As needed      . docusate sodium (COLACE) 100 MG capsule Take 100 mg by mouth 2 (two) times daily.       Marland Kitchen HYDROcodone-acetaminophen (VICODIN) 5-500 MG per tablet Take 1/2 tablet daily.      Marland Kitchen levothyroxine (SYNTHROID, LEVOTHROID) 50 MCG tablet Take 50 mcg by mouth daily.       . metoprolol tartrate (LOPRESSOR) 50 MG tablet Take 1 tablet (50 mg total) by mouth 2 (two) times daily.  180 tablet  3  . Multiple Vitamin (MULTIVITAMIN) tablet Take 1 tablet by mouth daily.      . nitroGLYCERIN (NITROSTAT)  0.4 MG SL tablet Place 1 tablet (0.4 mg total) under the tongue every 5 (five) minutes as needed for chest pain.  25 tablet  11  . omeprazole (PRILOSEC) 20 MG capsule Take 20 mg by mouth as needed.       . torsemide (DEMADEX) 20 MG tablet Take 1/2 tablet if no weight gain. Take one tablet if 2 lbs or more weight gain.      . potassium chloride (K-DUR) 10 MEQ tablet Take 1 tablet (10 mEq total) by mouth daily.  90 tablet  3     Review of Systems  Constitutional: Negative.   HENT: Negative.   Eyes: Negative.   Respiratory: Positive for shortness of breath.   Cardiovascular:  Positive for leg swelling.  Gastrointestinal: Negative.   Musculoskeletal: Negative.   Skin: Negative.   Neurological: Negative.   Hematological: Negative.   Psychiatric/Behavioral: Negative.   All other systems reviewed and are negative.    BP 132/62  Pulse 74  Ht 5\' 2"  (1.575 m)  Wt 145 lb 8 oz (65.998 kg)  BMI 26.61 kg/m2  Physical Exam  Nursing note and vitals reviewed. Constitutional: She is oriented to person, place, and time. She appears well-developed and well-nourished.  HENT:  Head: Normocephalic.  Nose: Nose normal.  Mouth/Throat: Oropharynx is clear and moist.  Eyes: Conjunctivae normal are normal. Pupils are equal, round, and reactive to light.  Neck: Normal range of motion. Neck supple. No JVD present.  Cardiovascular: Normal rate, regular rhythm, S1 normal, S2 normal, normal heart sounds and intact distal pulses.  Exam reveals no gallop and no friction rub.   No murmur heard.      Trace pitting edema to below the knees bilaterally  Pulmonary/Chest: Effort normal and breath sounds normal. No respiratory distress. She has no wheezes. She has no rales. She exhibits no tenderness.  Abdominal: Soft. Bowel sounds are normal. She exhibits no distension. There is no tenderness.  Musculoskeletal: Normal range of motion. She exhibits no edema and no tenderness.  Lymphadenopathy:    She has no cervical adenopathy.  Neurological: She is alert and oriented to person, place, and time. Coordination normal.  Skin: Skin is warm and dry. No rash noted. No erythema.  Psychiatric: She has a normal mood and affect. Her behavior is normal. Judgment and thought content normal.         Assessment and Plan

## 2012-06-18 NOTE — Assessment & Plan Note (Signed)
Edema is mild, stable on current medications. Weight is relatively stable on torsemide.

## 2012-06-18 NOTE — Assessment & Plan Note (Signed)
Chest pain is atypical in nature. Most of her symptoms seem to come from her left upper epigastric area at nighttime while sleeping. Improved with nitroglycerin. We did discuss cardiac catheterization, risk and benefits. The patient and daughter would prefer to try medical management first. We will start isosorbide mononitrate 30 mg at dinnertime as most of her symptoms seem to happen overnight. Previously she felt her discomfort was secondary to potassium.

## 2012-06-18 NOTE — Patient Instructions (Addendum)
You are doing well. Please start taking isosorbide at dinner for chest pain, Ok to keep take NTG under the tongue Please monitor the blood pressure on the new pill  Please call us if you have new issues that need to be addressed before your next appt.  Your physician wants you to follow-up in: 1 months.  You will receive a reminder letter in the mail two months in advance. If you don't receive a letter, please call our office to schedule the follow-up appointment.

## 2012-06-18 NOTE — Assessment & Plan Note (Signed)
Heart rate is well controlled, no changes to her medications.

## 2012-06-23 ENCOUNTER — Telehealth: Payer: Self-pay | Admitting: *Deleted

## 2012-06-23 NOTE — Telephone Encounter (Signed)
See below °FYI °

## 2012-06-23 NOTE — Telephone Encounter (Signed)
pts dtr wants [place] to know pt has had to use SL NTG x 5 since OV with Dr. Suzzanne Cloud she is taking isosorbide 30 mg at dinnertime as prescribed but pt has occasional CP during day dtr asks if ok to increase dose to 30 mg BID BP has been as follows: 110/70, 161/87, 134/68, 133/67, 142/79, 157/80, 160/106, 167/97 160/93, 126/61 I advised ok to increase to isosorbide 30 mg PO BID for CP but advised dtr to monitor BP VERY closely especially if giving SL NTG on top of the isosorbide I advised to hold isosorbide OR SL NTg if SBP is <100 mm hg She verb understanding and will keep [place] updated

## 2012-06-23 NOTE — Telephone Encounter (Signed)
Okay to increase to twice a day as you suggested

## 2012-06-23 NOTE — Telephone Encounter (Signed)
Pt daughter calling has questions about her isosorbide 30mg 

## 2012-06-24 ENCOUNTER — Ambulatory Visit: Payer: Medicare HMO | Admitting: Cardiovascular Disease

## 2012-06-30 ENCOUNTER — Telehealth: Payer: Self-pay | Admitting: *Deleted

## 2012-06-30 NOTE — Telephone Encounter (Signed)
Spoke with patient's daughter [name] and she mentioned that she is worried about her mother's BP fluctuating this morning she checked and it was 102/52 HR: 65 and then rechecked a little bit after 94/51 HR: 65. Please advise. Daughter would like to be contacted at [phone].

## 2012-06-30 NOTE — Telephone Encounter (Signed)
May need to cut back on isosorbide to one a day if blood pressure continues to drop If this was a one-time thing, could try isosorbide twice a day and again, pushing more fluids or cutting back on Lasix

## 2012-06-30 NOTE — Telephone Encounter (Signed)
Pts dtr, [name], is concerned about pt's BP. Says when she checked BP this am it was 102/52, HR=65. She checked it again and it was 94/51, HR=65. She made pt lie down and elevate legs and feet. She then gave her some gatorade and made her eat some salty potato chips.  BP came back up to 130 SBP. Pt was asymptomatic the whole time. She asks if she needs to avoid giving her isosorbide BID. I advised her to hold this am's dose but can resume this evening if BP better. I explained, if pt asymptomatic with low BP, this is less concerning. I advised dtr to look for symptoms of dizziness, near syncope or feeling lightheaded with low BP.  This is more concerning. In the meantime, she can try holding this am dose and give her evening dose of imdur if BP better. She will try this. She says imdur BID has helped symptoms "tremendously" and has only had to use SL NTG x2. I will call her back to check on her tomm am.

## 2012-06-30 NOTE — Telephone Encounter (Signed)
FYI

## 2012-07-01 ENCOUNTER — Telehealth: Payer: Self-pay | Admitting: *Deleted

## 2012-07-01 NOTE — Telephone Encounter (Signed)
"  Have pt try isosorbide 30 mg 1/2 tablet at hs and 1 whole tablet in am if BP tolerates" VO Dr. Alvis Lemmings, RN  Pts dtr informed Understanding verb

## 2012-07-01 NOTE — Telephone Encounter (Signed)
Spoke with pt daughter she wanted to check to see if Dr. Mariah Milling had responded about trying different medication. Ranexa,etc.

## 2012-07-01 NOTE — Telephone Encounter (Signed)
Pt daughter says she held pt's evening dose of isosorbide as well last night d/t "low blood pressures" of 103, 104, 111, 119 systolic. I explained these readings are not high and pt could probably tolerate the isosorbide QD. Dtr explains pt is dizzy with these pressures but understands pt needs this med since it "helps her chest pain so much".   BP this am=154/79, 133/67 I explained imdur 30 mg is the smallest dose and cannot cut these pills in 1/2 I told her I would talk to Dr. Mariah Milling about another med such as Ranexa,etc and call dtr back Understanding verb.

## 2012-07-01 NOTE — Telephone Encounter (Signed)
Please see below and advise. thanks 

## 2012-07-03 ENCOUNTER — Telehealth: Payer: Self-pay | Admitting: *Deleted

## 2012-07-03 NOTE — Telephone Encounter (Signed)
Pt daughter came into the office stating that pt has been up since 2 am having stomach pains, vomiting and diarrhea. She thinks its coming from the isosorbide. I informed the pt that there is also a bad stomach virus going around also

## 2012-07-03 NOTE — Telephone Encounter (Signed)
She continues to have abdominal problems at nighttime and initially it was a potassium, then it was another medicine. I don't think it is the medicine. I think she needs ultrasound of her gallbladder. If she has gallbladder already taken out, would need abdominal CT scan.

## 2012-07-03 NOTE — Telephone Encounter (Signed)
I spoke with dtr who says pt awoke this am at 0200 with abdominal pain, vomiting and diarrhea. She asks if this could be a SE of isosorbide 15 mg. I explained this is not a usual SE of this medication and should see her PCP since this may be the stomach virus going around.  Dtr says she has appt with PCP next week and pt has not had these symptoms since this am. She denies CP or SOB I advised dtr ok to try holding isosorbide to see if symptoms resolve but if these continue, she needs to see PCP prior to w/e to prevent risk of dehydration, etc BP=123/81, HR=90 136/64, HR=91 Understanding verb

## 2012-07-04 NOTE — Telephone Encounter (Signed)
Pt says she is feeling ok this am Denies further cp, nausea, vomiting or diarrhea She still has her gallbladder Denies further abd. Pain I explained she may need CT scan abd. To r/o gallbaldder cause She will monitor symptoms and let us know if this recurrs

## 2012-07-22 ENCOUNTER — Encounter: Payer: Self-pay | Admitting: Cardiovascular Disease

## 2012-07-22 ENCOUNTER — Ambulatory Visit (INDEPENDENT_AMBULATORY_CARE_PROVIDER_SITE_OTHER): Payer: Medicare HMO | Admitting: Cardiovascular Disease

## 2012-07-22 VITALS — BP 120/62 | HR 78 | Ht 62.0 in | Wt 144.2 lb

## 2012-07-22 DIAGNOSIS — I5033 Acute on chronic diastolic (congestive) heart failure: Secondary | ICD-10-CM

## 2012-07-22 DIAGNOSIS — I10 Essential (primary) hypertension: Secondary | ICD-10-CM

## 2012-07-22 DIAGNOSIS — R609 Edema, unspecified: Secondary | ICD-10-CM

## 2012-07-22 DIAGNOSIS — I4891 Unspecified atrial fibrillation: Secondary | ICD-10-CM

## 2012-07-22 DIAGNOSIS — R079 Chest pain, unspecified: Secondary | ICD-10-CM

## 2012-07-22 DIAGNOSIS — I509 Heart failure, unspecified: Secondary | ICD-10-CM

## 2012-07-22 MED ORDER — ISOSORBIDE DINITRATE 10 MG PO TABS: 10.0000 mg | ORAL_TABLET | Freq: Three times a day (TID) | ORAL | Status: DC | PRN

## 2012-07-22 NOTE — Assessment & Plan Note (Signed)
Doing well on torsemide. Daughter checks weight religiously.

## 2012-07-22 NOTE — Assessment & Plan Note (Addendum)
Edema is very mild and stable.she we'll hold torsemide for weight less than 142 pounds.

## 2012-07-22 NOTE — Assessment & Plan Note (Signed)
Currently stable on warfarin. Rate is well controlled.

## 2012-07-22 NOTE — Progress Notes (Signed)
Patient ID: Kathryn Short, female    DOB: 09/23/1921, 79 y.o.   MRN: [id number]  HPI Comments: Kathryn Short is an 79 year old woman with paroxysmal atrial fibrillation, sick sinus syndrome, pacemaker placement followed by Dr. Graciela Husbands, in normal sinus rhythm earlier this year who presented to the hospital [facility] on 02/16/2012 with dizziness and malaise over one to 2 weeks, heart rate up to 150 beats per minute in atrial fibrillation.  She was started on metoprolol and amiodarone, diltiazem was continued at 180 mg daily (she did not start 120 mg daily dose as recommended by Dr. Graciela Husbands). Heart rate improved and she was discharged home.   She has since been back to the emergency room with chest pain, dizziness, malaise. Feelings of insomnia, and anxious feeling, chest pain episodes at night that wake her from sleep, weakness, nausea, dizziness. She has obstructive sleep apnea and does not use her BiPAP at nighttime  On her last several office visits, she reported significant lower extremity edema. She was taking Lasix 40 mg daily. We increased this to twice a day dosing .   diltiazem was held for edema and metoprolol was increased to 50 mg twice a day.   Lasix was changed to torsemide for worsening cough, shortness of breath and edema. Her daughter reports that she is taking one half torsemide daily, occasionally 1 full tab for any weight gain. Weight has fluctuated between 141 and in the  to 146 pounds.   She continues to have 2 or 3 episodes of chest pain on the left radiating into her abdomen that occur at 2 or 3 in the morning. It wakes her from sleep. No significant symptoms during the daytime. Symptoms are leg with nitroglycerin times one or 2. She try isosorbide mononitrate and initially it was felt that this was helping and she had less chest pain. Daughter cut this back for some reason, possibly nausea in the morning.  Her weight has been stable at 141-145 pounds. Blood pressure ranges from 120-150  systolic, occasionally higher  Prior to Echocardiogram in the hospital showed ejection fraction 50-55%, moderate LVH, mild to moderate TR, right ventricular systolic pressure 40 mm of mercury, mild to moderate aortic valve insufficiency TSH in the hospital 4.76  EKG today shows paced rhythm at 78 beats per minute, underlying atrial fibrillation    Outpatient Encounter Prescriptions as of 07/22/2012  Medication Sig Dispense Refill  . acetaminophen (TYLENOL) 500 MG tablet Takes 2-4 tablets daily.      Marland Kitchen amiodarone (PACERONE) 200 MG tablet Take 200 mg by mouth daily.       Marland Kitchen b complex vitamins tablet Take 1 tablet by mouth daily.      . Cyanocobalamin (VITAMIN B-12) 1000 MCG SUBL Place 1,000 mcg under the tongue every 30 (thirty) days.        Marland Kitchen dicyclomine (BENTYL) 10 MG capsule Take 10 mg by mouth daily. As needed      . docusate sodium (COLACE) 100 MG capsule Take 100 mg by mouth 2 (two) times daily.       Marland Kitchen HYDROcodone-acetaminophen (VICODIN) 5-500 MG per tablet Take 1/2 tablet daily.      Marland Kitchen levothyroxine (SYNTHROID, LEVOTHROID) 50 MCG tablet Take 50 mcg by mouth daily.       . metoprolol tartrate (LOPRESSOR) 50 MG tablet Take 1 tablet (50 mg total) by mouth 2 (two) times daily.  180 tablet  3  . Multiple Vitamin (MULTIVITAMIN) tablet Take 1 tablet by mouth daily.      Marland Kitchen  nitroGLYCERIN (NITROSTAT) 0.4 MG SL tablet Place 1 tablet (0.4 mg total) under the tongue every 5 (five) minutes as needed for chest pain.  25 tablet  11  . omeprazole (PRILOSEC) 20 MG capsule Take 20 mg by mouth as needed.       . potassium chloride (K-DUR) 10 MEQ tablet Take 1 tablet (10 mEq total) by mouth daily.  90 tablet  3  . Probiotic Product (PROBIOTIC DAILY PO) Take by mouth daily.      Marland Kitchen torsemide (DEMADEX) 20 MG tablet Take 1/2 tablet if no weight gain. Take one tablet if 2 lbs or more weight gain.      Marland Kitchen warfarin (COUMADIN) 3 MG tablet Take 3 mg by mouth daily.      . isosorbide mononitrate (IMDUR) 30 MG 24 hr  tablet Take 1 tablet (30 mg total) by mouth 2 (two) times daily as needed.  60 tablet  6     Review of Systems  Constitutional: Negative.   HENT: Negative.   Eyes: Negative.   Cardiovascular: Positive for chest pain.  Gastrointestinal: Negative.   Musculoskeletal: Negative.   Skin: Negative.   Neurological: Negative.   Psychiatric/Behavioral: Negative.   All other systems reviewed and are negative.    BP 120/62  Pulse 78  Ht 5\' 2"  (1.575 m)  Wt 144 lb 4 oz (65.431 kg)  BMI 26.38 kg/m2  Physical Exam  Nursing note and vitals reviewed. Constitutional: She is oriented to person, place, and time. She appears well-developed and well-nourished.  HENT:  Head: Normocephalic.  Nose: Nose normal.  Mouth/Throat: Oropharynx is clear and moist.  Eyes: Conjunctivae are normal. Pupils are equal, round, and reactive to light.  Neck: Normal range of motion. Neck supple. No JVD present.  Cardiovascular: Normal rate, regular rhythm, S1 normal, S2 normal, normal heart sounds and intact distal pulses.  Exam reveals no gallop and no friction rub.   No murmur heard. Trace pitting edema to below the knees bilaterally  Pulmonary/Chest: Effort normal and breath sounds normal. No respiratory distress. She has no wheezes. She has no rales. She exhibits no tenderness.  Abdominal: Soft. Bowel sounds are normal. She exhibits no distension. There is no tenderness.  Musculoskeletal: Normal range of motion. She exhibits no edema and no tenderness.  Lymphadenopathy:    She has no cervical adenopathy.  Neurological: She is alert and oriented to person, place, and time. Coordination normal.  Skin: Skin is warm and dry. No rash noted. No erythema.  Psychiatric: She has a normal mood and affect. Her behavior is normal. Judgment and thought content normal.    Assessment and Plan

## 2012-07-22 NOTE — Assessment & Plan Note (Signed)
Atypical type chest pain, commonly in the middle of the night that wakes her from sleep. We will try isosorbide dinitrate instead of isosorbide mononitrate. She'll take this before bed. Prescription will be for 10 mg pill , but Okay to take 20 mg if this helps her symptoms

## 2012-07-22 NOTE — Assessment & Plan Note (Signed)
Blood pressure is in a reasonable range on her current medication regimen.

## 2012-07-22 NOTE — Patient Instructions (Addendum)
You are doing well.  For weight less than 142, hold the diuretic and the potassium  Hold the isosorbide 24 hour pill Try the short acting isosorbide dinitrate at night before bed for chest pain  Please call us if you have new issues that need to be addressed before your next appt.  Your physician wants you to follow-up in: 1 months.  You will receive a reminder letter in the mail two months in advance. If you don't receive a letter, please call our office to schedule the follow-up appointment.

## 2012-08-05 ENCOUNTER — Telehealth: Payer: Self-pay

## 2012-08-05 NOTE — Telephone Encounter (Signed)
Patient has soreness around pacemaker; symptoms for two to three days. Gunnar Fusi check her pacemaker about one month ago and pacemaker was fine.  Patient does not have any other symptoms.

## 2012-08-05 NOTE — Telephone Encounter (Signed)
Per pt's dtr, [name], pt has been c/o soreness to the touch at pacemaker site PPM was interrogated last month and all values were WNL She denies any redness, edema or drainage at site No other symptoms dtr asks if pt should come in to be seen I told her I would discuss with Dr. Graciela Husbands first since it may be just musculoskeletal pain and not need to come in Understanding verb

## 2012-08-06 ENCOUNTER — Telehealth: Payer: Self-pay

## 2012-08-06 NOTE — Telephone Encounter (Signed)
Pt's dtr, [name], came by office to report pt still c/o pain at [facility] site. Denies redness, swelling or drainage Unsure if she slept on it wrong or bumped device Has tried tylenol/ibuprofen for pain  I was able to speak with Gunnar Fusi in device clinic in [place] who suggests reassuring pt, if no redness, swelling or drainage noted, may not be r/t PPM. She says if symptoms continue, may work her in with Dr. Graciela Husbands  Reassurance was given to dtr. She says they will continue to monitor pain and will call us should this continue for an appt with Dr. Graciela Husbands.  Otherwise she will call PCP to see if it may not be r/t a v"lump in breast"

## 2012-08-07 NOTE — Telephone Encounter (Signed)
We should look at it , either me or paula or GT thanks steve

## 2012-08-08 ENCOUNTER — Telehealth: Payer: Self-pay

## 2012-08-08 NOTE — Telephone Encounter (Signed)
See other telephone note.  

## 2012-08-08 NOTE — Telephone Encounter (Signed)
Notified patient's daughter have the patient come for a device check with Gunnar Fusi on August 15, 2012 at 9 am. The daughhter will have the patient be aware and will make sure she gets to this appointment. The daughter will call back if she has any more difficulties before the appointment in March.

## 2012-08-19 ENCOUNTER — Ambulatory Visit: Payer: Medicare HMO | Admitting: Cardiovascular Disease

## 2012-08-26 ENCOUNTER — Ambulatory Visit (INDEPENDENT_AMBULATORY_CARE_PROVIDER_SITE_OTHER): Payer: Medicare HMO | Admitting: Cardiovascular Disease

## 2012-08-26 ENCOUNTER — Ambulatory Visit: Payer: Medicare HMO | Admitting: Cardiovascular Disease

## 2012-08-26 ENCOUNTER — Encounter: Payer: Self-pay | Admitting: Cardiovascular Disease

## 2012-08-26 VITALS — BP 140/72 | HR 65 | Ht 62.0 in | Wt 145.8 lb

## 2012-08-26 DIAGNOSIS — R079 Chest pain, unspecified: Secondary | ICD-10-CM

## 2012-08-26 DIAGNOSIS — R609 Edema, unspecified: Secondary | ICD-10-CM

## 2012-08-26 DIAGNOSIS — I5033 Acute on chronic diastolic (congestive) heart failure: Secondary | ICD-10-CM

## 2012-08-26 DIAGNOSIS — I509 Heart failure, unspecified: Secondary | ICD-10-CM

## 2012-08-26 NOTE — Assessment & Plan Note (Signed)
No recent chest pain symptoms. Has not taken nitroglycerin sublingual for some time

## 2012-08-26 NOTE — Assessment & Plan Note (Signed)
We have encouraged her to stay on her same medications, continue torsemide as needed for worsening edema or shortness of breath.

## 2012-08-26 NOTE — Progress Notes (Signed)
Patient ID: Kathryn Short, female    DOB: [birth year], 79 y.o.   MRN: [id number]  HPI Comments: Kathryn Short is an 79 year old woman with paroxysmal atrial fibrillation, sick sinus syndrome, pacemaker placement followed by Dr. Graciela Short, in normal sinus rhythm earlier this year who presented to the hospital [facility] on 02/16/2012 with dizziness and malaise over one to 2 weeks, heart rate up to 150 beats per minute in atrial fibrillation.this improved with medical management. She presents today for routine followup.  Overall she reports that she is doing well. She has a bruise around her right eye from a stumble. She is now living with her daughter. Blood pressures have been well controlled with systolic in the 120-140 range, heart rate 60-70. Weight has been relatively stable at 144 pounds. Mild edema after eating out several nights in a row recently.she denies any recent chest pain episodes.she has not needed nitroglycerinon a regular raises  In the past, She was started on metoprolol and amiodarone, diltiazem was continued at 180 mg daily (she did not start 120 mg daily dose as recommended by Dr. Graciela Short). Heart rate improved and she was discharged home.  She has since been back to the emergency room with chest pain, dizziness, malaise. Feelings of insomnia, and anxious feeling, chest pain episodes at night that wake her from sleep, weakness, nausea, dizziness. She has obstructive sleep apnea and does not use her BiPAP at nighttime  Previous office visits, she reported significant lower extremity edema. She was taking Lasix 40 mg daily. We increased this to twice a day dosing .   diltiazem was held for edema and metoprolol was increased to 50 mg twice a day.   Lasix was changed to torsemide for worsening cough, shortness of breath and edema. Her daughter reports that she is taking one half torsemide daily, occasionally 1 full tab for any weight gain. Weight has fluctuated between 141 up to 146 pounds.    Previously had episodes of  chest pain on the left radiating into her abdomen that occur at 2 or 3 in the morning. It wakes her from sleep. No significant symptoms during the daytime. Symptoms are leg with nitroglycerin times one or 2. She try isosorbide mononitrate and initially it was felt that this was helping and she had less chest pain. Daughter cut this back for some reason, possibly nausea in the morning.  Prior to Echocardiogram in the hospital showed ejection fraction 50-55%, moderate LVH, mild to moderate TR, right ventricular systolic pressure 40 mm of mercury, mild to moderate aortic valve insufficiency TSH in the hospital 4.76  EKG today shows paced rhythm at 66 beats per minute    Outpatient Encounter Prescriptions as of 08/26/2012  Medication Sig Dispense Refill  . acetaminophen (TYLENOL) 500 MG tablet Takes 2-4 tablets daily.      Marland Kitchen amiodarone (PACERONE) 200 MG tablet Take 200 mg by mouth daily.       Marland Kitchen b complex vitamins tablet Take 1 tablet by mouth daily.      . Cyanocobalamin (VITAMIN B-12) 1000 MCG SUBL Place 1,000 mcg under the tongue every 30 (thirty) days.        Marland Kitchen dicyclomine (BENTYL) 10 MG capsule Take 10 mg by mouth daily. As needed      . docusate sodium (COLACE) 100 MG capsule Take 100 mg by mouth 2 (two) times daily.       Marland Kitchen HYDROcodone-acetaminophen (VICODIN) 5-500 MG per tablet Take 1/2 tablet daily.      Marland Kitchen  isosorbide dinitrate (ISORDIL) 10 MG tablet Take 1 tablet (10 mg total) by mouth 3 (three) times daily as needed.  90 tablet  3  . levothyroxine (SYNTHROID, LEVOTHROID) 50 MCG tablet Take 50 mcg by mouth daily.       . metoprolol tartrate (LOPRESSOR) 50 MG tablet Take 1 tablet (50 mg total) by mouth 2 (two) times daily.  180 tablet  3  . Multiple Vitamin (MULTIVITAMIN) tablet Take 1 tablet by mouth daily.      . nitroGLYCERIN (NITROSTAT) 0.4 MG SL tablet Place 1 tablet (0.4 mg total) under the tongue every 5 (five) minutes as needed for chest pain.  25  tablet  11  . omeprazole (PRILOSEC) 20 MG capsule Take 20 mg by mouth as needed.       . potassium chloride (K-DUR) 10 MEQ tablet Take 1 tablet (10 mEq total) by mouth daily.  90 tablet  3  . Probiotic Product (PROBIOTIC DAILY PO) Take by mouth daily.      Marland Kitchen torsemide (DEMADEX) 20 MG tablet Take 1/2 tablet if no weight gain. Take one tablet if 2 lbs or more weight gain.      Marland Kitchen warfarin (COUMADIN) 3 MG tablet Take 3 mg by mouth daily.       No facility-administered encounter medications on file as of 08/26/2012.    Review of Systems  Constitutional: Negative.   HENT: Negative.   Eyes: Negative.   Gastrointestinal: Negative.   Musculoskeletal: Negative.   Skin: Negative.   Neurological: Negative.   Psychiatric/Behavioral: Negative.   All other systems reviewed and are negative.    BP 140/72  Pulse 65  Ht 5\' 2"  (1.575 m)  Wt 145 lb 12 oz (66.112 kg)  BMI 26.65 kg/m2  Physical Exam  Nursing note and vitals reviewed. Constitutional: She is oriented to person, place, and time. She appears well-developed and well-nourished.  HENT:  Head: Normocephalic.  Nose: Nose normal.  Mouth/Throat: Oropharynx is clear and moist.  Eyes: Conjunctivae are normal. Pupils are equal, round, and reactive to light.  Neck: Normal range of motion. Neck supple. No JVD present.  Cardiovascular: Normal rate, regular rhythm, S1 normal, S2 normal, normal heart sounds and intact distal pulses.  Exam reveals no gallop and no friction rub.   No murmur heard. Trace pitting edema to below the knees bilaterally  Pulmonary/Chest: Effort normal and breath sounds normal. No respiratory distress. She has no wheezes. She has no rales. She exhibits no tenderness.  Abdominal: Soft. Bowel sounds are normal. She exhibits no distension. There is no tenderness.  Musculoskeletal: Normal range of motion. She exhibits no edema and no tenderness.  Lymphadenopathy:    She has no cervical adenopathy.  Neurological: She is alert  and oriented to person, place, and time. Coordination normal.  Skin: Skin is warm and dry. No rash noted. No erythema.  Psychiatric: She has a normal mood and affect. Her behavior is normal. Judgment and thought content normal.    Assessment and Plan

## 2012-08-26 NOTE — Assessment & Plan Note (Signed)
Paced rhythm today. Rate well controlled

## 2012-08-26 NOTE — Assessment & Plan Note (Signed)
Trace edema today. We have suggested she take torsemide when necessary

## 2012-08-26 NOTE — Patient Instructions (Addendum)
You are doing well. No medication changes were made. Continue torsemide as needed for edema or weight gain  Please call us if you have new issues that need to be addressed before your next appt.  Your physician wants you to follow-up in: 1 months.

## 2012-09-25 ENCOUNTER — Emergency Department: Payer: Self-pay | Admitting: Unknown Physician Specialty

## 2012-09-25 LAB — PROTIME-INR
INR: 1.7
Prothrombin Time: 19.7 secs — ABNORMAL HIGH (ref 11.5–14.7)

## 2012-09-26 ENCOUNTER — Ambulatory Visit: Payer: Medicare HMO | Admitting: Cardiovascular Disease

## 2012-09-29 ENCOUNTER — Ambulatory Visit: Payer: Self-pay | Admitting: Internal Medicine

## 2012-10-29 ENCOUNTER — Encounter: Payer: Self-pay | Admitting: Cardiovascular Disease

## 2012-10-29 ENCOUNTER — Ambulatory Visit (INDEPENDENT_AMBULATORY_CARE_PROVIDER_SITE_OTHER): Payer: Medicare HMO | Admitting: Cardiovascular Disease

## 2012-10-29 VITALS — BP 144/82 | HR 62 | Ht 62.0 in | Wt 145.2 lb

## 2012-10-29 DIAGNOSIS — R609 Edema, unspecified: Secondary | ICD-10-CM

## 2012-10-29 DIAGNOSIS — Z9181 History of falling: Secondary | ICD-10-CM

## 2012-10-29 DIAGNOSIS — R296 Repeated falls: Secondary | ICD-10-CM

## 2012-10-29 DIAGNOSIS — I4891 Unspecified atrial fibrillation: Secondary | ICD-10-CM

## 2012-10-29 DIAGNOSIS — I10 Essential (primary) hypertension: Secondary | ICD-10-CM

## 2012-10-29 NOTE — Progress Notes (Signed)
Patient ID: Kathryn Short, female    DOB: Nov 04, 1921, 79 y.o.   MRN: [id number]  HPI Comments: Kathryn Short is an 79 year old woman with paroxysmal atrial fibrillation, sick sinus syndrome, pacemaker placement followed by Dr. Graciela Husbands,  who presented to the hospital [facility] on 02/16/2012 in atrial fibrillation. She presents today for routine followup.  She has a history of falls. Daughter reports 3 recent severe falls 2 of which she hit her head requiring stitches, one where she hit her ribs. She is using a cane, reluctant to use a walker. Daughter is concerned about her being on warfarin given these recent falls. I not has been up and down, labile with the use of antibiotics for urinary tract infections.  Blood pressure has been relatively stable, systolic pressure 140-150. Heart rate stable in the 60s. Weight also stable in the low 140 range She continues to take occasional diuretic for any worsening lower some edema.  Overall she reports that she is doing well. She has a bruise around her right eye from a stumble. She is now living with her daughter. Blood pressures have been well controlled with systolic in the 120-140 range, heart rate 60-70. Weight has been relatively stable at 144 pounds. Mild edema after eating out several nights in a row recently.she denies any recent chest pain episodes.she has not needed nitroglycerinon a regular raises  In the past, She was started on metoprolol and amiodarone, diltiazem was continued at 180 mg daily (she did not start 120 mg daily dose as recommended by Dr. Graciela Husbands). Heart rate improved and she was discharged home.   previous visits to the  emergency room with chest pain, dizziness, malaise. Feelings of insomnia, and anxious feeling, chest pain episodes at night that wake her from sleep, weakness, nausea, dizziness. She has obstructive sleep apnea and does not use her BiPAP at nighttime  Previous office visits, she reported significant lower extremity edema.  She was taking Lasix 40 mg daily. We increased this to twice a day dosing .   diltiazem was held for edema and metoprolol was increased to 50 mg twice a day.   Lasix was changed to torsemide for worsening cough, shortness of breath and edema. Her daughter reports that she is taking one half torsemide daily, occasionally 1 full tab for any weight gain. Weight has fluctuated between 141 up to 146 pounds.   Previously had episodes of  chest pain on the left radiating into her abdomen that occur at 2 or 3 in the morning. It wakes her from sleep. No significant symptoms during the daytime. Symptoms are leg with nitroglycerin times one or 2. She try isosorbide mononitrate and initially it was felt that this was helping and she had less chest pain. Daughter cut this back for some reason, possibly nausea in the morning.  Prior to Echocardiogram in the hospital showed ejection fraction 50-55%, moderate LVH, mild to moderate TR, right ventricular systolic pressure 40 mm of mercury, mild to moderate aortic valve insufficiency TSH in the hospital 4.76  EKG today shows  normal sinus rhythm with rate 62 beats per minute   Outpatient Encounter Prescriptions as of 10/29/2012  Medication Sig Dispense Refill  . acetaminophen (TYLENOL) 500 MG tablet Takes 2-4 tablets daily.      Marland Kitchen amiodarone (PACERONE) 200 MG tablet Take 200 mg by mouth daily.       Marland Kitchen b complex vitamins tablet Take 1 tablet by mouth daily.      . Cyanocobalamin (VITAMIN B-12)  1000 MCG SUBL Place 1,000 mcg under the tongue every 30 (thirty) days.        Marland Kitchen dicyclomine (BENTYL) 10 MG capsule Take 10 mg by mouth daily. As needed      . docusate sodium (COLACE) 100 MG capsule Take 100 mg by mouth 2 (two) times daily.       Marland Kitchen HYDROcodone-acetaminophen (VICODIN) 5-500 MG per tablet Take 1/2 tablet daily.      . isosorbide dinitrate (ISORDIL) 10 MG tablet Take 1 tablet (10 mg total) by mouth 3 (three) times daily as needed.  90 tablet  3  . levothyroxine  (SYNTHROID, LEVOTHROID) 50 MCG tablet Take 50 mcg by mouth daily.       . metoprolol tartrate (LOPRESSOR) 50 MG tablet Take 1 tablet (50 mg total) by mouth 2 (two) times daily.  180 tablet  3  . Multiple Vitamin (MULTIVITAMIN) tablet Take 1 tablet by mouth daily.      . nitrofurantoin, macrocrystal-monohydrate, (MACROBID) 100 MG capsule 2 (two) times daily.       . nitroGLYCERIN (NITROSTAT) 0.4 MG SL tablet Place 1 tablet (0.4 mg total) under the tongue every 5 (five) minutes as needed for chest pain.  25 tablet  11  . omeprazole (PRILOSEC) 20 MG capsule Take 20 mg by mouth as needed.       . potassium chloride (K-DUR) 10 MEQ tablet Take 1 tablet (10 mEq total) by mouth daily.  90 tablet  3  . Probiotic Product (PROBIOTIC DAILY PO) Take by mouth daily.      Marland Kitchen torsemide (DEMADEX) 20 MG tablet Take 1/2 tablet if no weight gain. Take one tablet if 2 lbs or more weight gain.      Marland Kitchen warfarin (COUMADIN) 3 MG tablet Take 3 mg by mouth daily.       No facility-administered encounter medications on file as of 10/29/2012.    Review of Systems  Constitutional: Negative.        Frequent falls  HENT: Negative.   Eyes: Negative.   Gastrointestinal: Negative.   Musculoskeletal: Positive for gait problem.  Skin: Negative.   Neurological: Negative.   Psychiatric/Behavioral: Negative.   All other systems reviewed and are negative.    BP 144/82  Pulse 62  Ht 5\' 2"  (1.575 m)  Wt 145 lb 4 oz (65.885 kg)  BMI 26.56 kg/m2  SpO2 98%  Physical Exam  Nursing note and vitals reviewed. Constitutional: She is oriented to person, place, and time. She appears well-developed and well-nourished.  HENT:  Head: Normocephalic.  Nose: Nose normal.  Mouth/Throat: Oropharynx is clear and moist.  Eyes: Conjunctivae are normal. Pupils are equal, round, and reactive to light.  Neck: Normal range of motion. Neck supple. No JVD present.  Cardiovascular: Normal rate, regular rhythm, S1 normal, S2 normal and intact  distal pulses.  Exam reveals no gallop and no friction rub.   Murmur heard.  Systolic murmur is present with a grade of 2/6  Trace pitting edema to below the knees bilaterally  Pulmonary/Chest: Effort normal and breath sounds normal. No respiratory distress. She has no wheezes. She has no rales. She exhibits no tenderness.  Abdominal: Soft. Bowel sounds are normal. She exhibits no distension. There is no tenderness.  Musculoskeletal: Normal range of motion. She exhibits no edema and no tenderness.  Lymphadenopathy:    She has no cervical adenopathy.  Neurological: She is alert and oriented to person, place, and time. Coordination normal.  Skin: Skin is warm and  dry. No rash noted. No erythema.  Psychiatric: She has a normal mood and affect. Her behavior is normal. Judgment and thought content normal.    Assessment and Plan

## 2012-10-29 NOTE — Assessment & Plan Note (Signed)
We have suggested she hold warfarin given recent falls requiring stitches to her head. Reluctant to use a walker. We have suggested she try a walker and use her cane on a regular basis. We did send a large part of her visit today talking about risk and benefit of warfarin use. Daughter is excepting holding the warfarin, knowing there will be a risk of stroke. She will add low-dose aspirin.

## 2012-10-29 NOTE — Assessment & Plan Note (Signed)
Edema stable. She uses torsemide when necessary

## 2012-10-29 NOTE — Assessment & Plan Note (Signed)
By EKG, appears to be normal sinus rhythm. We'll be of help on pacer evaluation if she is having paroxysmal atrial fibrillation. For now we have suggested she hold her warfarin given frequent falls with severe trauma to her head per the daughter.

## 2012-10-29 NOTE — Patient Instructions (Addendum)
Hold the warfarin  For atrial fibrillation episode, Take amiodarone x 2 and 1/2 metoprolol pill  Please call us if you have new issues that need to be addressed before your next appt.  Your physician wants you to follow-up in: 6 months.  You will receive a reminder letter in the mail two months in advance. If you don't receive a letter, please call our office to schedule the follow-up appointment.

## 2012-10-29 NOTE — Assessment & Plan Note (Signed)
Blood pressure borderline elevated but she does report dizziness with bending over and standing up, likely inner ear. No blood pressure medication changes have been made given she is frail and blood pressure is adequate.

## 2012-11-21 ENCOUNTER — Telehealth: Payer: Self-pay

## 2012-11-21 NOTE — Telephone Encounter (Signed)
Pts dtr walked in office She is concerned b/c pt's BP continues to climb SBP=153-170 daily Denies dizziness, etc Says PCP was hesitant to start her on medication for this but wants your opinion HR=60's  Only BP lowering medication I see is metoprolol Not taking isosorbide anymore d/t stomach cramping allergic to ACE-I and HCTZ  I told her I would talk with Dr. Mariah Milling and call her back Understanding verb

## 2012-11-22 NOTE — Telephone Encounter (Signed)
Could start losartan 50 mg daily (try to cut in 1/2 to start)  If still high on a 1/2, increase to a full pill Also made in a 100 mg pill

## 2012-11-24 ENCOUNTER — Other Ambulatory Visit: Payer: Self-pay

## 2012-11-24 MED ORDER — LOSARTAN POTASSIUM 50 MG PO TABS: 25.0000 mg | ORAL_TABLET | Freq: Every day | ORAL | Status: DC

## 2012-11-24 NOTE — Telephone Encounter (Signed)
Pt and pt's dtr informed Understanding verb RX sent to pharm

## 2012-12-04 ENCOUNTER — Encounter: Payer: Self-pay | Admitting: Cardiovascular Disease

## 2012-12-04 ENCOUNTER — Ambulatory Visit (INDEPENDENT_AMBULATORY_CARE_PROVIDER_SITE_OTHER): Payer: Medicare HMO | Admitting: Cardiovascular Disease

## 2012-12-04 VITALS — BP 120/64 | HR 61 | Ht 62.0 in | Wt 140.0 lb

## 2012-12-04 DIAGNOSIS — Z9181 History of falling: Secondary | ICD-10-CM

## 2012-12-04 DIAGNOSIS — R296 Repeated falls: Secondary | ICD-10-CM

## 2012-12-04 DIAGNOSIS — R609 Edema, unspecified: Secondary | ICD-10-CM

## 2012-12-04 DIAGNOSIS — I10 Essential (primary) hypertension: Secondary | ICD-10-CM

## 2012-12-04 DIAGNOSIS — R079 Chest pain, unspecified: Secondary | ICD-10-CM

## 2012-12-04 DIAGNOSIS — I4891 Unspecified atrial fibrillation: Secondary | ICD-10-CM

## 2012-12-04 MED ORDER — LOSARTAN POTASSIUM 50 MG PO TABS: 50.0000 mg | ORAL_TABLET | Freq: Every day | ORAL | Status: DC

## 2012-12-04 NOTE — Assessment & Plan Note (Signed)
Blood pressure running high. She will increase the losartan to 50 mg daily or 25 mg twice a day. This could be increased further if needed for hypertension.

## 2012-12-04 NOTE — Assessment & Plan Note (Signed)
Appears to be maintaining normal sinus rhythm on today's visit. We'll continue current medications

## 2012-12-04 NOTE — Assessment & Plan Note (Signed)
Previous discussion with her daughter. Given high fall risk, no longer taking warfarin

## 2012-12-04 NOTE — Assessment & Plan Note (Signed)
She is wearing compression hose. Edema is stable. She takes torsemide as needed

## 2012-12-04 NOTE — Progress Notes (Signed)
Patient ID: Kathryn Short, female    DOB: 1921/10/01, 79 y.o.   MRN: [id number]  HPI Comments: [name] is an 79 year old woman with paroxysmal atrial fibrillation, hypertension,  sick sinus syndrome, pacemaker placement followed by Dr. Graciela Husbands,  who presented to the hospital [facility] on 02/16/2012 in atrial fibrillation. She presents today for routine followup.  history of falls with trauma requiring stitches.  She is using a cane, reluctant to use a walker. Daughter is concerned about her being on warfarin given these recent falls and warfarin has been held.   She continues to take occasional diuretic for any worsening lower some edema. She wears TED hose She has been doing well overall. No recent falls on today's visit. Blood pressure continues to run high. Daughter takes meticulous notes which she presents with her today. Frequent systolic pressure in the 160 range. Total cholesterol 189, LDL 104, hematocrit 38, creatinine 0.8   previous visits to the  emergency room with chest pain, dizziness, malaise. Feelings of insomnia, and anxious feeling, chest pain episodes at night that wake her from sleep, weakness, nausea, dizziness. She has obstructive sleep apnea and does not use her BiPAP at nighttime   Weight has fluctuated between 140 up to 146 pounds.  Previous episodes of abdominal pain have resolved, Not a complaint today.   Echocardiogram in the hospital showed ejection fraction 50-55%, moderate LVH, mild to moderate TR, right ventricular systolic pressure 40 mm of mercury, mild to moderate aortic valve insufficiency  EKG today shows  normal sinus rhythm with rate 61 beats per minute   Outpatient Encounter Prescriptions as of 12/04/2012  Medication Sig Dispense Refill  . acetaminophen (TYLENOL) 500 MG tablet Takes 2-4 tablets daily.      Marland Kitchen amiodarone (PACERONE) 200 MG tablet Take 200 mg by mouth daily.       Marland Kitchen aspirin 81 MG tablet Take 162 mg by mouth daily.      Marland Kitchen b complex vitamins  tablet Take 1 tablet by mouth daily.      . Cyanocobalamin (VITAMIN B-12) 1000 MCG SUBL Place 1,000 mcg under the tongue every 30 (thirty) days.        Marland Kitchen dicyclomine (BENTYL) 10 MG capsule Take 10 mg by mouth daily. As needed      . docusate sodium (COLACE) 100 MG capsule Take 100 mg by mouth 2 (two) times daily.       Marland Kitchen HYDROcodone-acetaminophen (VICODIN) 5-500 MG per tablet Take 1/2 tablet daily.      . isosorbide dinitrate (ISORDIL) 10 MG tablet Take 1 tablet (10 mg total) by mouth 3 (three) times daily as needed.  90 tablet  3  . levothyroxine (SYNTHROID, LEVOTHROID) 50 MCG tablet Take 50 mcg by mouth daily.       Marland Kitchen losartan (COZAAR) 50 MG tablet Take 0.5 tablets (25 mg total) by mouth daily.  90 tablet  3  . metoprolol tartrate (LOPRESSOR) 50 MG tablet Take 1 tablet (50 mg total) by mouth 2 (two) times daily.  180 tablet  3  . Multiple Vitamin (MULTIVITAMIN) tablet Take 1 tablet by mouth daily.      . nitrofurantoin, macrocrystal-monohydrate, (MACROBID) 100 MG capsule 2 (two) times daily.       . nitroGLYCERIN (NITROSTAT) 0.4 MG SL tablet Place 1 tablet (0.4 mg total) under the tongue every 5 (five) minutes as needed for chest pain.  25 tablet  11  . omeprazole (PRILOSEC) 20 MG capsule Take 20 mg by mouth as  needed.       . potassium chloride (K-DUR) 10 MEQ tablet Take 1 tablet (10 mEq total) by mouth daily.  90 tablet  3  . Probiotic Product (PROBIOTIC DAILY PO) Take by mouth daily.      Marland Kitchen torsemide (DEMADEX) 20 MG tablet Take 1/2 tablet if no weight gain. Take one tablet if 2 lbs or more weight gain.      . [DISCONTINUED] warfarin (COUMADIN) 3 MG tablet Take 3 mg by mouth daily.       No facility-administered encounter medications on file as of 12/04/2012.    Review of Systems  Constitutional: Negative.        Frequent falls  HENT: Negative.   Eyes: Negative.   Respiratory: Negative.   Cardiovascular: Negative.   Gastrointestinal: Negative.   Musculoskeletal: Positive for gait  problem.  Skin: Negative.   Neurological: Negative.   Psychiatric/Behavioral: Negative.   All other systems reviewed and are negative.    BP 120/64  Pulse 61  Ht 5\' 2"  (1.575 m)  Wt 140 lb (63.504 kg)  BMI 25.6 kg/m2  Physical Exam  Nursing note and vitals reviewed. Constitutional: She is oriented to person, place, and time. She appears well-developed and well-nourished.  HENT:  Head: Normocephalic.  Nose: Nose normal.  Mouth/Throat: Oropharynx is clear and moist.  Eyes: Conjunctivae are normal. Pupils are equal, round, and reactive to light.  Neck: Normal range of motion. Neck supple. No JVD present.  Cardiovascular: Normal rate, regular rhythm, S1 normal, S2 normal and intact distal pulses.  Exam reveals no gallop and no friction rub.   Murmur heard.  Systolic murmur is present with a grade of 2/6  Trace pitting edema to below the knees bilaterally  Pulmonary/Chest: Effort normal and breath sounds normal. No respiratory distress. She has no wheezes. She has no rales. She exhibits no tenderness.  Abdominal: Soft. Bowel sounds are normal. She exhibits no distension. There is no tenderness.  Musculoskeletal: Normal range of motion. She exhibits no edema and no tenderness.  Lymphadenopathy:    She has no cervical adenopathy.  Neurological: She is alert and oriented to person, place, and time. Coordination normal.  Skin: Skin is warm and dry. No rash noted. No erythema.  Psychiatric: She has a normal mood and affect. Her behavior is normal. Judgment and thought content normal.    Assessment and Plan

## 2012-12-04 NOTE — Patient Instructions (Signed)
You are doing well. Please increase the losartan to 25 mg twice a day  For new atrial fibrillation, Take amiodarone x 2 and metoprolol x 1,  If still in atrial fibrillation a few hours later, Take amiodarone x 2 again  Please call us if you have new issues that need to be addressed before your next appt.  Your physician wants you to follow-up in: 1 months.

## 2012-12-11 ENCOUNTER — Other Ambulatory Visit: Payer: Self-pay

## 2012-12-11 ENCOUNTER — Telehealth: Payer: Self-pay

## 2012-12-11 MED ORDER — LOSARTAN POTASSIUM 50 MG PO TABS: 50.0000 mg | ORAL_TABLET | Freq: Two times a day (BID) | ORAL | Status: DC

## 2012-12-11 NOTE — Telephone Encounter (Signed)
Spoke with Thurston Hole (daughter) states Melissa RN has already contacted regarding losartan.

## 2012-12-11 NOTE — Telephone Encounter (Signed)
dtr dropped off BP readings fro pt since starting losartan 50 mg 1/2 tablet BID 162/78,72 172/85,72 147/69,61 157/75,60 147/76,61 161/70,64 156/69,67 152/74,76 124/61,60 139/60,63 157/77,63 159/64,61 166/73,69 127/75,66 161/72,62 157/72,66

## 2012-12-11 NOTE — Telephone Encounter (Signed)
Has question regarding losartin please call

## 2012-12-11 NOTE — Telephone Encounter (Signed)
I advised dtr to have pt go ahead and increase losartan to 50 mg BID and continue to monitor BPs Understanding verb

## 2012-12-11 NOTE — Telephone Encounter (Signed)
FYI See what I told dtr

## 2012-12-15 ENCOUNTER — Other Ambulatory Visit: Payer: Self-pay

## 2012-12-15 MED ORDER — LOSARTAN POTASSIUM 50 MG PO TABS: 50.0000 mg | ORAL_TABLET | Freq: Two times a day (BID) | ORAL | Status: DC

## 2012-12-15 NOTE — Telephone Encounter (Signed)
Refill sent for losartan potassium for 50 mg take one tablet twice a day.

## 2012-12-16 ENCOUNTER — Encounter: Payer: Self-pay | Admitting: *Deleted

## 2013-01-06 ENCOUNTER — Encounter: Payer: Self-pay | Admitting: Cardiovascular Disease

## 2013-01-06 ENCOUNTER — Ambulatory Visit (INDEPENDENT_AMBULATORY_CARE_PROVIDER_SITE_OTHER): Payer: Medicare HMO | Admitting: Cardiovascular Disease

## 2013-01-06 VITALS — BP 148/64 | HR 61 | Ht 62.0 in | Wt 139.8 lb

## 2013-01-06 DIAGNOSIS — I10 Essential (primary) hypertension: Secondary | ICD-10-CM

## 2013-01-06 DIAGNOSIS — I4891 Unspecified atrial fibrillation: Secondary | ICD-10-CM

## 2013-01-06 DIAGNOSIS — R296 Repeated falls: Secondary | ICD-10-CM

## 2013-01-06 DIAGNOSIS — R609 Edema, unspecified: Secondary | ICD-10-CM

## 2013-01-06 DIAGNOSIS — I5033 Acute on chronic diastolic (congestive) heart failure: Secondary | ICD-10-CM

## 2013-01-06 DIAGNOSIS — Z9181 History of falling: Secondary | ICD-10-CM

## 2013-01-06 DIAGNOSIS — I509 Heart failure, unspecified: Secondary | ICD-10-CM

## 2013-01-06 MED ORDER — TORSEMIDE 20 MG PO TABS: 20.0000 mg | ORAL_TABLET | Freq: Two times a day (BID) | ORAL | Status: DC | PRN

## 2013-01-06 NOTE — Assessment & Plan Note (Signed)
Appears to be maintaining normal sinus rhythm. No changes to her medications 

## 2013-01-06 NOTE — Patient Instructions (Addendum)
You are doing well.  Try a few days with a full torsemide, Decrease the fluid intake for a few days  Please call us if you have new issues that need to be addressed before your next appt.  Your physician wants you to follow-up in: 1 month.

## 2013-01-06 NOTE — Assessment & Plan Note (Signed)
We'll continue to take her current medications, nitroglycerin for any spikes in her blood pressure which are rare.

## 2013-01-06 NOTE — Progress Notes (Signed)
Patient ID: Kathryn Short, female    DOB: 09/19/1921, 79 y.o.   MRN: [id number]  HPI Comments: Kathryn Short is an 79 year old woman with paroxysmal atrial fibrillation, diastolic CHF , hypertension,  sick sinus syndrome, pacemaker placement followed by Dr. Graciela Husbands,  who presented to the hospital [facility] on 02/16/2012 in atrial fibrillation. She presents today for routine followup.  history of falls with trauma requiring stitches.  She is using a cane, reluctant to use a walker. Daughter is concerned about her being on warfarin given these recent falls and warfarin has been held.   She takes torsemide one half pill daily for edema and shortness of breath.  She wears TED hose Weight has been relatively stable. Family does report increasing cough when supine over the past month. Cough is white, phlegm. Not very active at baseline, shortness of breath is stable.  Blood pressure has been relatively stable, occasionally is elevated and she takes nitroglycerin when necessary which helps. Previous lab work; Total cholesterol 189, LDL 104, hematocrit 38, creatinine 0.8   previous visits to the  emergency room with chest pain, dizziness, malaise. Feelings of insomnia, and anxious feeling, chest pain episodes at night that wake her from sleep, weakness, nausea, dizziness. She has obstructive sleep apnea and does not use her BiPAP at nighttime  Previous episodes of abdominal pain have resolved, Not a complaint today.  Daughter feel she had a reaction to isosorbide and this has been held  Echocardiogram in the hospital showed ejection fraction 50-55%, moderate LVH, mild to moderate TR, right ventricular systolic pressure 40 mm of mercury, mild to moderate aortic valve insufficiency  EKG today shows  normal sinus rhythm with rate 61 beats per minute   Outpatient Encounter Prescriptions as of 01/06/2013  Medication Sig Dispense Refill  . acetaminophen (TYLENOL) 500 MG tablet Takes 2-4 tablets daily.      Marland Kitchen  amiodarone (PACERONE) 200 MG tablet Take 200 mg by mouth daily.       Marland Kitchen aspirin 81 MG tablet Take 162 mg by mouth daily.      Marland Kitchen b complex vitamins tablet Take 1 tablet by mouth daily.      . Cyanocobalamin (VITAMIN B-12) 1000 MCG SUBL Place 1,000 mcg under the tongue every 30 (thirty) days.        Marland Kitchen dicyclomine (BENTYL) 10 MG capsule Take 10 mg by mouth daily. As needed      . docusate sodium (COLACE) 100 MG capsule Take 100 mg by mouth 2 (two) times daily.       Marland Kitchen HYDROcodone-acetaminophen (VICODIN) 5-500 MG per tablet Take 1/2 tablet daily.      . isosorbide dinitrate (ISORDIL) 10 MG tablet Take 1 tablet (10 mg total) by mouth 3 (three) times daily as needed.  90 tablet  3  . levothyroxine (SYNTHROID, LEVOTHROID) 50 MCG tablet Take 50 mcg by mouth daily.       Marland Kitchen losartan (COZAAR) 50 MG tablet Take 1 tablet (50 mg total) by mouth 2 (two) times daily.  60 tablet  3  . metoprolol tartrate (LOPRESSOR) 50 MG tablet Take 1 tablet (50 mg total) by mouth 2 (two) times daily.  180 tablet  3  . Multiple Vitamin (MULTIVITAMIN) tablet Take 1 tablet by mouth daily.      . nitrofurantoin, macrocrystal-monohydrate, (MACROBID) 100 MG capsule 2 (two) times daily.       . nitroGLYCERIN (NITROSTAT) 0.4 MG SL tablet Place 1 tablet (0.4 mg total) under the tongue every  5 (five) minutes as needed for chest pain.  25 tablet  11  . omeprazole (PRILOSEC) 20 MG capsule Take 20 mg by mouth as needed.       . potassium chloride (K-DUR) 10 MEQ tablet Take 1 tablet (10 mEq total) by mouth daily.  90 tablet  3  . Probiotic Product (PROBIOTIC DAILY PO) Take by mouth daily.      Marland Kitchen torsemide (DEMADEX) 20 MG tablet Take 1/2 tablet if no weight gain. Take one tablet if 2 lbs or more weight gain.       No facility-administered encounter medications on file as of 01/06/2013.    Review of Systems  Constitutional: Negative.        Frequent falls  HENT: Negative.   Eyes: Negative.   Respiratory: Positive for cough.         Increasing cough when supine  Cardiovascular: Negative.   Gastrointestinal: Negative.   Musculoskeletal: Positive for gait problem.  Skin: Negative.   Neurological: Negative.   Psychiatric/Behavioral: Negative.   All other systems reviewed and are negative.    BP 148/64  Pulse 61  Wt 139 lb 12 oz (63.39 kg)  BMI 25.55 kg/m2  Physical Exam  Nursing note and vitals reviewed. Constitutional: She is oriented to person, place, and time. She appears well-developed and well-nourished.  HENT:  Head: Normocephalic.  Nose: Nose normal.  Mouth/Throat: Oropharynx is clear and moist.  Eyes: Conjunctivae are normal. Pupils are equal, round, and reactive to light.  Neck: Normal range of motion. Neck supple. No JVD present.  Cardiovascular: Normal rate, regular rhythm, S1 normal, S2 normal and intact distal pulses.  Exam reveals no gallop and no friction rub.   Murmur heard.  Systolic murmur is present with a grade of 2/6  Trace pitting edema to below the knees bilaterally  Pulmonary/Chest: Effort normal and breath sounds normal. No respiratory distress. She has no wheezes. She has no rales. She exhibits no tenderness.  Abdominal: Soft. Bowel sounds are normal. She exhibits no distension. There is no tenderness.  Musculoskeletal: Normal range of motion. She exhibits edema. She exhibits no tenderness.  Lymphadenopathy:    She has no cervical adenopathy.  Neurological: She is alert and oriented to person, place, and time. Coordination normal.  Skin: Skin is warm and dry. No rash noted. No erythema.  Psychiatric: She has a normal mood and affect. Her behavior is normal. Judgment and thought content normal.    Assessment and Plan

## 2013-01-06 NOTE — Assessment & Plan Note (Signed)
Increasing cough. Weight is stable. Cause of her cough when supine concerning for diastolic CHF. She does drink significant fluids daily. We have suggested she decrease her fluids, take a full torsemide rather than a half pill for several days to see if this helps her symptoms.

## 2013-01-06 NOTE — Assessment & Plan Note (Signed)
High fall risk, encouraged her to use a walker.

## 2013-01-06 NOTE — Assessment & Plan Note (Signed)
She does have chronic swelling of her feet, likely from venous insufficiency. Appears relatively stable. Not wearing her compression hose today.

## 2013-01-29 ENCOUNTER — Encounter: Payer: Self-pay | Admitting: Internal Medicine

## 2013-01-29 ENCOUNTER — Ambulatory Visit (INDEPENDENT_AMBULATORY_CARE_PROVIDER_SITE_OTHER): Payer: Medicare HMO | Admitting: Internal Medicine

## 2013-01-29 VITALS — BP 124/65 | HR 62 | Ht 62.0 in | Wt 140.0 lb

## 2013-01-29 DIAGNOSIS — I495 Sick sinus syndrome: Secondary | ICD-10-CM

## 2013-01-29 DIAGNOSIS — R296 Repeated falls: Secondary | ICD-10-CM

## 2013-01-29 DIAGNOSIS — I4891 Unspecified atrial fibrillation: Secondary | ICD-10-CM

## 2013-01-29 DIAGNOSIS — Z950 Presence of cardiac pacemaker: Secondary | ICD-10-CM

## 2013-01-29 DIAGNOSIS — Z9181 History of falling: Secondary | ICD-10-CM

## 2013-01-29 LAB — PACEMAKER DEVICE OBSERVATION
BAMS-0001: 175 {beats}/min
RV LEAD AMPLITUDE: 5.6 mv
RV LEAD IMPEDENCE PM: 409 Ohm
RV LEAD THRESHOLD: 1.25 V

## 2013-01-29 NOTE — Assessment & Plan Note (Signed)
The patient's device was interrogated.  The information was reviewed. No changes were made in the programming.    

## 2013-01-29 NOTE — Assessment & Plan Note (Signed)
As above She needs ongoing surveillance for amiodarone toxicity. She denies cough or nausea. Liver function tests were normal in April. She needs thyroid function testing. With the fact that she had prolonged atrial fibrillation in March although none since then, we will continue her on her current dose

## 2013-01-29 NOTE — Assessment & Plan Note (Signed)
Stable post pascing

## 2013-01-29 NOTE — Assessment & Plan Note (Signed)
The patient has had falls. There are largely related to balance by family report. She also has some orthostatic lightheadedness. This may be aggravated by things like when necessary nitroglycerin as well as they're chasing of her elevated blood,pressures that come  Periodically.  There is a great deal of work that has been done in the frequency of falls that should preclude anticoagulation and in patients with CHADS-score of greater than 2 that number is very high given the relative infrequency of injury, but still lower frequency of head trauma and is still lower frequency of bleeding related to that trauma. However, in this case, her falls seem to be related to balance which may derive from her her reluctance to use a walker. I have suggested strongly that a walker and anticoagulation is for her a better option than a cane and aspirin.  The data suggests that aspirin is inadequate for atrial fibrillation protection, is associated with a higher risk of bleeding been taking nothing and the overall benefit in atrial fibrillation may in fact be negative.  I suggested she consider apixaban.

## 2013-01-29 NOTE — Progress Notes (Signed)
Patient Care Team: Barbette Reichmann, MD as PCP - General (Internal Medicine)   HPI  Kathryn Short is a 79 y.o. female seen in followup for atrial fibrillation and bradycardia with prior pacemaker implantation.  She was previously on warfarin but because of falls it has been elected to stop it.  She is taking 2 asa  She has orthostatic lightheadedness. Her falls however have been related to loss of balance or stumbling. She has been reluctant thus far to giveup her cane   for a walker  2014 ultrasonography at [facility] >>ejection fraction 50-55%, moderate LVH, mild to moderate TR, right ventricular systolic pressure 40 mm of mercury, mild to moderate aortic valve insufficiency  She has no history of coronary disease. She recalls having had a stress test at [facility]; we are unable to find records.   She has been doing relatively well without shortness of breath. She has had problems with peripheral edema.     Past Medical History  Diagnosis Date  . Atrial fibrillation   . Hypertension   . OSA (obstructive sleep apnea)     On BiPAP  . GERD (gastroesophageal reflux disease)   . Hypothyroidism     Treated  . Uterine cancer     s/p radiation therapy in 1952  . Melanoma     Resected from back  . Fall     Past Surgical History  Procedure Laterality Date  . Total abdominal hysterectomy w/ bilateral salpingoophorectomy    . Colostomy      For perforation  . Cholecystectomy    . Cytoscopy    . Insert / replace / remove pacemaker      Implantation-[employer]  . Skin biopsy      face    Current Outpatient Prescriptions  Medication Sig Dispense Refill  . acetaminophen (TYLENOL) 500 MG tablet Takes 2-4 tablets daily.      Marland Kitchen amiodarone (PACERONE) 200 MG tablet Take 200 mg by mouth daily.       Marland Kitchen aspirin 81 MG tablet Take 162 mg by mouth daily.      Marland Kitchen b complex vitamins tablet Take 1 tablet by mouth daily.      Marland Kitchen dicyclomine (BENTYL) 10 MG capsule Take 10 mg by mouth daily. As needed       . docusate sodium (COLACE) 100 MG capsule Take 100 mg by mouth 2 (two) times daily.       Marland Kitchen HYDROcodone-acetaminophen (VICODIN) 5-500 MG per tablet Take 1/2 tablet daily.      Marland Kitchen levothyroxine (SYNTHROID, LEVOTHROID) 50 MCG tablet Take 50 mcg by mouth daily.       Marland Kitchen losartan (COZAAR) 50 MG tablet Take 1 tablet (50 mg total) by mouth 2 (two) times daily.  60 tablet  3  . metoprolol tartrate (LOPRESSOR) 50 MG tablet Take 1 tablet (50 mg total) by mouth 2 (two) times daily.  180 tablet  3  . Multiple Vitamin (MULTIVITAMIN) tablet Take 1 tablet by mouth daily.      . nitroGLYCERIN (NITROSTAT) 0.4 MG SL tablet Place 1 tablet (0.4 mg total) under the tongue every 5 (five) minutes as needed for chest pain.  25 tablet  11  . omeprazole (PRILOSEC) 20 MG capsule Take 20 mg by mouth as needed.       . potassium chloride (K-DUR) 10 MEQ tablet Take 1 tablet (10 mEq total) by mouth daily.  90 tablet  3  . Probiotic Product (PROBIOTIC DAILY PO) Take by mouth daily.      Marland Kitchen  torsemide (DEMADEX) 20 MG tablet Take 1 tablet (20 mg total) by mouth 2 (two) times daily as needed. Take 1/2 tablet if no weight gain. Take one tablet if 2 lbs or more weight gain.  60 tablet  6   No current facility-administered medications for this visit.    Allergies  Allergen Reactions  . Ace Inhibitors   . Ambien [Zolpidem Tartrate]   . Bactrim [Sulfamethoxazole W-Trimethoprim]   . Digoxin And Related   . Hctz [Hydrochlorothiazide]   . Lunesta [Eszopiclone]     Review of Systems negative except from HPI and PMH  Physical Exam BP 124/65  Pulse 62  Ht 5\' 2"  (1.575 m)  Wt 140 lb (63.504 kg)  BMI 25.6 kg/m2 Well developed and well nourished in no acute distress HENT normal E scleral and icterus clear Neck Supplethis Clear to ausculation Regular rate and rhythm, 2/6 murmur  Soft  No clubbing cyanosis trace  Edema Alert and oriented, grossly normal motor and sensory function Skin Warm and Dry    Assessment and   Plan

## 2013-01-29 NOTE — Patient Instructions (Signed)
Your physician wants you to follow-up in: 6 months with Gunnar Fusi for pacer check and 12 months with Dr.Klein You will receive a reminder letter in the mail two months in advance. If you don't receive a letter, please call our office to schedule the follow-up appointment.

## 2013-02-11 ENCOUNTER — Encounter: Payer: Self-pay | Admitting: Cardiovascular Disease

## 2013-02-11 ENCOUNTER — Ambulatory Visit (INDEPENDENT_AMBULATORY_CARE_PROVIDER_SITE_OTHER): Payer: Medicare HMO | Admitting: Cardiovascular Disease

## 2013-02-11 VITALS — BP 128/64 | HR 61 | Ht 62.0 in | Wt 140.2 lb

## 2013-02-11 DIAGNOSIS — Z9181 History of falling: Secondary | ICD-10-CM

## 2013-02-11 DIAGNOSIS — I4891 Unspecified atrial fibrillation: Secondary | ICD-10-CM

## 2013-02-11 DIAGNOSIS — I509 Heart failure, unspecified: Secondary | ICD-10-CM

## 2013-02-11 DIAGNOSIS — R0602 Shortness of breath: Secondary | ICD-10-CM

## 2013-02-11 DIAGNOSIS — I5033 Acute on chronic diastolic (congestive) heart failure: Secondary | ICD-10-CM

## 2013-02-11 DIAGNOSIS — R296 Repeated falls: Secondary | ICD-10-CM

## 2013-02-11 DIAGNOSIS — I10 Essential (primary) hypertension: Secondary | ICD-10-CM

## 2013-02-11 NOTE — Assessment & Plan Note (Signed)
Labile blood pressure. 120s in the office. Higher at home with average in the 140-160 range. No lightheadedness at home. No changes to her medications. 4 significant hypertension that is not improved, suggested she try one half for a full nitroglycerin as she has been doing with good success rather than staying on regular medications daily.

## 2013-02-11 NOTE — Assessment & Plan Note (Signed)
No recent falls. Encouraged she continue to use her walker. Falls in the past.

## 2013-02-11 NOTE — Assessment & Plan Note (Signed)
Appears to be relatively euvolemic on her current medication regimen

## 2013-02-11 NOTE — Patient Instructions (Addendum)
You are doing well. No medication changes were made.  Please call us if you have new issues that need to be addressed before your next appt.  Your physician wants you to follow-up in: 1 month.  

## 2013-02-11 NOTE — Assessment & Plan Note (Signed)
Notes indicate no long runs of atrial fibrillation since March 2014. Daughter and patient are both concerned about starting anticoagulation. Patient requests holding off on eliquis at this time. We'll monitor her arrhythmia through routine pacemaker checks

## 2013-02-11 NOTE — Progress Notes (Signed)
Patient ID: Kathryn Short, female    DOB: 1921/10/18, 79 y.o.   MRN: [id number]  HPI Comments: Kathryn Short is an 79 year old woman with paroxysmal atrial fibrillation (last episode in March 2014), diastolic CHF , hypertension,  sick sinus syndrome, pacemaker placement followed by Dr. Graciela Husbands,  who presented to the hospital [facility] on 02/16/2012 in atrial fibrillation. She presents today for routine followup.  history of falls with trauma requiring stitches.  She is using a cane, reluctant to use a walker.  No recent falls. Daughter is concerned about recent recommendation from Dr. Graciela Husbands to start anticoagulation. She is afraid of her fall risk, other complications from anticoagulation.  Blood pressures at home have typically ranged in the 140-160 range. Blood pressures in our office on the past 2 visits in the 120 systolic range.  she takes nitroglycerin when necessary  (rarely ) when blood pressure runs up into the 170 range  which helps.  She takes torsemide one half pill daily for edema and shortness of breath.  She wears TED hose Weight has been relatively stable.   Previous lab work; Total cholesterol 189, LDL 104, hematocrit 38, creatinine 0.8   previous visits to the  emergency room with chest pain, dizziness, malaise. Feelings of insomnia, and anxious feeling, chest pain episodes at night that wake her from sleep, weakness, nausea, dizziness. She has obstructive sleep apnea and does not use her BiPAP at nighttime  Previous episodes of abdominal pain have resolved, Not a complaint today.  Daughter feel she had a reaction to isosorbide and this has been held  Echocardiogram in the hospital showed ejection fraction 50-55%, moderate LVH, mild to moderate TR, right ventricular systolic pressure 40 mm of mercury, mild to moderate aortic valve insufficiency  EKG today shows  normal sinus rhythm with rate 61 beats per minute,    Outpatient Encounter Prescriptions as of 02/11/2013  Medication  Sig Dispense Refill  . acetaminophen (TYLENOL) 500 MG tablet Takes 2-4 tablets daily.      Marland Kitchen amiodarone (PACERONE) 200 MG tablet Take 200 mg by mouth daily.       Marland Kitchen aspirin 81 MG tablet Take 162 mg by mouth daily.      Marland Kitchen b complex vitamins tablet Take 1 tablet by mouth daily.      Marland Kitchen dicyclomine (BENTYL) 10 MG capsule Take 10 mg by mouth daily. As needed      . docusate sodium (COLACE) 100 MG capsule Take 100 mg by mouth 2 (two) times daily.       Marland Kitchen HYDROcodone-acetaminophen (VICODIN) 5-500 MG per tablet Take 1/2 tablet daily.      Marland Kitchen levothyroxine (SYNTHROID, LEVOTHROID) 50 MCG tablet Take 50 mcg by mouth daily.       Marland Kitchen losartan (COZAAR) 50 MG tablet Take 1 tablet (50 mg total) by mouth 2 (two) times daily.  60 tablet  3  . metoprolol tartrate (LOPRESSOR) 50 MG tablet Take 1 tablet (50 mg total) by mouth 2 (two) times daily.  180 tablet  3  . Multiple Vitamin (MULTIVITAMIN) tablet Take 1 tablet by mouth daily.      . nitroGLYCERIN (NITROSTAT) 0.4 MG SL tablet Place 1 tablet (0.4 mg total) under the tongue every 5 (five) minutes as needed for chest pain.  25 tablet  11  . omeprazole (PRILOSEC) 20 MG capsule Take 20 mg by mouth as needed.       . potassium chloride (K-DUR) 10 MEQ tablet Take 1 tablet (10 mEq total)  by mouth daily.  90 tablet  3  . Probiotic Product (PROBIOTIC DAILY PO) Take by mouth daily.      Marland Kitchen torsemide (DEMADEX) 20 MG tablet Take 1 tablet (20 mg total) by mouth 2 (two) times daily as needed. Take 1/2 tablet if no weight gain. Take one tablet if 2 lbs or more weight gain.  60 tablet  6   No facility-administered encounter medications on file as of 02/11/2013.    Review of Systems  Constitutional: Negative.        Frequent falls  HENT: Negative.   Eyes: Negative.   Respiratory: Positive for cough.        Increasing cough when supine  Cardiovascular: Negative.   Gastrointestinal: Negative.   Musculoskeletal: Positive for gait problem.  Skin: Negative.   Neurological:  Negative.   Psychiatric/Behavioral: Negative.   All other systems reviewed and are negative.    BP 128/64  Pulse 61  Ht 5\' 2"  (1.575 m)  Wt 140 lb 4 oz (63.617 kg)  BMI 25.65 kg/m2  Physical Exam  Nursing note and vitals reviewed. Constitutional: She is oriented to person, place, and time. She appears well-developed and well-nourished.  HENT:  Head: Normocephalic.  Nose: Nose normal.  Mouth/Throat: Oropharynx is clear and moist.  Eyes: Conjunctivae are normal. Pupils are equal, round, and reactive to light.  Neck: Normal range of motion. Neck supple. No JVD present.  Cardiovascular: Normal rate, regular rhythm, S1 normal, S2 normal and intact distal pulses.  Exam reveals no gallop and no friction rub.   Murmur heard.  Systolic murmur is present with a grade of 2/6  Trace pitting edema to below the knees bilaterally  Pulmonary/Chest: Effort normal and breath sounds normal. No respiratory distress. She has no wheezes. She has no rales. She exhibits no tenderness.  Abdominal: Soft. Bowel sounds are normal. She exhibits no distension. There is no tenderness.  Musculoskeletal: Normal range of motion. She exhibits edema. She exhibits no tenderness.  Lymphadenopathy:    She has no cervical adenopathy.  Neurological: She is alert and oriented to person, place, and time. Coordination normal.  Skin: Skin is warm and dry. No rash noted. No erythema.  Psychiatric: She has a normal mood and affect. Her behavior is normal. Judgment and thought content normal.    Assessment and Plan

## 2013-03-13 ENCOUNTER — Encounter: Payer: Self-pay | Admitting: Cardiovascular Disease

## 2013-03-13 ENCOUNTER — Ambulatory Visit (INDEPENDENT_AMBULATORY_CARE_PROVIDER_SITE_OTHER): Payer: Medicare HMO | Admitting: Cardiovascular Disease

## 2013-03-13 VITALS — BP 150/85 | HR 61 | Ht 62.0 in | Wt 138.8 lb

## 2013-03-13 DIAGNOSIS — R609 Edema, unspecified: Secondary | ICD-10-CM

## 2013-03-13 DIAGNOSIS — I10 Essential (primary) hypertension: Secondary | ICD-10-CM

## 2013-03-13 DIAGNOSIS — I4891 Unspecified atrial fibrillation: Secondary | ICD-10-CM

## 2013-03-13 DIAGNOSIS — R0602 Shortness of breath: Secondary | ICD-10-CM

## 2013-03-13 DIAGNOSIS — R079 Chest pain, unspecified: Secondary | ICD-10-CM

## 2013-03-13 MED ORDER — CLONIDINE HCL 0.1 MG PO TABS: 0.1000 mg | ORAL_TABLET | Freq: Two times a day (BID) | ORAL | Status: DC

## 2013-03-13 NOTE — Assessment & Plan Note (Addendum)
Blood pressure is running consistently elevated by her numbers. Mildly elevated in the office today systolic pressure 150. We will start clonidine 0.1 mg one half tab twice a day with slow titration up to 1 pill twice a day for hypertension. Uncertain if there is an anxiety component in her hypertension

## 2013-03-13 NOTE — Progress Notes (Signed)
Patient ID: Kathryn Short, female    DOB: [birth year], 79 y.o.   MRN: [id number]  HPI Comments: Kathryn Short is an 79 year old woman with paroxysmal atrial fibrillation (last episode in March 2014), diastolic CHF , hypertension,  sick sinus syndrome, pacemaker placement followed by Dr. Graciela Husbands,  who presented to the hospital [facility] on 02/16/2012 in atrial fibrillation. She presents today for routine followup.  She currently lives with her daughter who does all of her medications. history of falls with trauma requiring stitches.  She is using a cane, reluctant to use a walker. No recent falls. Patient and daughter are afraid of her fall risk, other complications from anticoagulation. Currently not on anticoagulation at their request. They are aware of risks and benefits associated with arrhythmia and stroke he.  She takes torsemide one half pill daily for edema and shortness of breath. Now only taking diuretic 3 times per week . She wears TED hose Weight has been relatively stable. Daughter reports that blood pressure has been elevated, typically in the 160-180 range. She takes nitroglycerin when necessary for high pressures. The pain is getting worse.  Previous lab work; Total cholesterol 189, LDL 104, hematocrit 38, creatinine 0.8  previous visits to the  emergency room with chest pain, dizziness, malaise. Feelings of insomnia, and anxious feeling, chest pain episodes at night that wake her from sleep, weakness, nausea, dizziness. She has obstructive sleep apnea and does not use her BiPAP at nighttime  Previous episodes of abdominal pain have resolved, Not a complaint today.  Daughter feels she had a reaction to isosorbide and this has been held  Echocardiogram in the hospital showed ejection fraction 50-55%, moderate LVH, mild to moderate TR, right ventricular systolic pressure 40 mm of mercury, mild to moderate aortic valve insufficiency  EKG today shows  normal sinus rhythm with rate 61 beats  per minute,    Outpatient Encounter Prescriptions as of 03/13/2013  Medication Sig Dispense Refill  . acetaminophen (TYLENOL) 500 MG tablet Takes 2-4 tablets daily.      Marland Kitchen amiodarone (PACERONE) 200 MG tablet Take 200 mg by mouth daily.       Marland Kitchen aspirin 81 MG tablet Take 162 mg by mouth daily.      Marland Kitchen b complex vitamins tablet Take 1 tablet by mouth daily.      Marland Kitchen dicyclomine (BENTYL) 10 MG capsule Take 10 mg by mouth daily. As needed      . docusate sodium (COLACE) 100 MG capsule Take 100 mg by mouth 2 (two) times daily.       Marland Kitchen HYDROcodone-acetaminophen (VICODIN) 5-500 MG per tablet Take 1/2 tablet daily.      Marland Kitchen levothyroxine (SYNTHROID, LEVOTHROID) 50 MCG tablet Take 50 mcg by mouth daily.       Marland Kitchen losartan (COZAAR) 50 MG tablet Take 1 tablet (50 mg total) by mouth 2 (two) times daily.  60 tablet  3  . metoprolol tartrate (LOPRESSOR) 50 MG tablet Take 1 tablet (50 mg total) by mouth 2 (two) times daily.  180 tablet  3  . Multiple Vitamin (MULTIVITAMIN) tablet Take 1 tablet by mouth daily.      . nitroGLYCERIN (NITROSTAT) 0.4 MG SL tablet Place 1 tablet (0.4 mg total) under the tongue every 5 (five) minutes as needed for chest pain.  25 tablet  11  . omeprazole (PRILOSEC) 20 MG capsule Take 20 mg by mouth as needed.       . potassium chloride (K-DUR) 10 MEQ tablet Take  1 tablet (10 mEq total) by mouth daily.  90 tablet  3  . Probiotic Product (PROBIOTIC DAILY PO) Take by mouth daily.      Marland Kitchen torsemide (DEMADEX) 20 MG tablet Take 1 tablet (20 mg total) by mouth 2 (two) times daily as needed. Take 1/2 tablet if no weight gain. Take one tablet if 2 lbs or more weight gain.  60 tablet  6   No facility-administered encounter medications on file as of 03/13/2013.    Review of Systems  Constitutional: Negative.        Frequent falls  HENT: Negative.   Eyes: Negative.   Respiratory:       Increasing cough when supine  Cardiovascular: Negative.   Gastrointestinal: Negative.   Musculoskeletal:  Positive for gait problem.  Skin: Negative.   Neurological: Negative.   Psychiatric/Behavioral: Negative.   All other systems reviewed and are negative.    BP 112/71  Pulse 61  Ht 5\' 2"  (1.575 m)  Wt 138 lb 12 oz (62.937 kg)  BMI 25.37 kg/m2  Physical Exam  Nursing note and vitals reviewed. Constitutional: She is oriented to person, place, and time. She appears well-developed and well-nourished.  HENT:  Head: Normocephalic.  Nose: Nose normal.  Mouth/Throat: Oropharynx is clear and moist.  Eyes: Conjunctivae are normal. Pupils are equal, round, and reactive to light.  Neck: Normal range of motion. Neck supple. No JVD present.  Cardiovascular: Normal rate, regular rhythm, S1 normal, S2 normal and intact distal pulses.  Exam reveals no gallop and no friction rub.   Murmur heard.  Systolic murmur is present with a grade of 2/6  Pulmonary/Chest: Effort normal and breath sounds normal. No respiratory distress. She has no wheezes. She has no rales. She exhibits no tenderness.  Abdominal: Soft. Bowel sounds are normal. She exhibits no distension. There is no tenderness.  Musculoskeletal: Normal range of motion. She exhibits no tenderness.  Lymphadenopathy:    She has no cervical adenopathy.  Neurological: She is alert and oriented to person, place, and time. Coordination normal.  Skin: Skin is warm and dry. No rash noted. No erythema.  Psychiatric: She has a normal mood and affect. Her behavior is normal. Judgment and thought content normal.    Assessment and Plan

## 2013-03-13 NOTE — Assessment & Plan Note (Signed)
No significant edema on today's visit. Suggested she take torsemide when necessary for leg swelling or weight gain

## 2013-03-13 NOTE — Patient Instructions (Addendum)
Blood pressures are running high  Please start clonidine 1/2 pill twice a day  If blood pressure continues to run high, increase to a full pill twice a day Continue to use nitro as needed  Please call us if you have new issues that need to be addressed before your next appt.  Your physician wants you to follow-up in: 1 month.

## 2013-03-13 NOTE — Assessment & Plan Note (Signed)
She has refused anticoagulation in the past. Maintaining normal sinus rhythm today

## 2013-04-10 ENCOUNTER — Other Ambulatory Visit: Payer: Self-pay | Admitting: *Deleted

## 2013-04-10 MED ORDER — POTASSIUM CHLORIDE ER 10 MEQ PO TBCR: 10.0000 meq | EXTENDED_RELEASE_TABLET | Freq: Every day | ORAL | Status: DC

## 2013-04-10 NOTE — Telephone Encounter (Signed)
Requested Prescriptions   Signed Prescriptions Disp Refills  . potassium chloride (K-DUR) 10 MEQ tablet 90 tablet 3    Sig: Take 1 tablet (10 mEq total) by mouth daily.    Authorizing Provider: GOLLAN, TIMOTHY J    Ordering User: LOPEZ, MARINA C    

## 2013-04-13 ENCOUNTER — Encounter: Payer: Self-pay | Admitting: Cardiovascular Disease

## 2013-04-13 ENCOUNTER — Ambulatory Visit (INDEPENDENT_AMBULATORY_CARE_PROVIDER_SITE_OTHER): Payer: Medicare HMO | Admitting: Cardiovascular Disease

## 2013-04-13 VITALS — BP 140/68 | HR 61 | Ht 62.0 in | Wt 140.0 lb

## 2013-04-13 DIAGNOSIS — I10 Essential (primary) hypertension: Secondary | ICD-10-CM

## 2013-04-13 DIAGNOSIS — Z950 Presence of cardiac pacemaker: Secondary | ICD-10-CM

## 2013-04-13 DIAGNOSIS — R609 Edema, unspecified: Secondary | ICD-10-CM

## 2013-04-13 DIAGNOSIS — R0602 Shortness of breath: Secondary | ICD-10-CM

## 2013-04-13 DIAGNOSIS — I4891 Unspecified atrial fibrillation: Secondary | ICD-10-CM

## 2013-04-13 NOTE — Assessment & Plan Note (Signed)
No significant edema on today's visit. We'll continue diuretic 3 times per week

## 2013-04-13 NOTE — Progress Notes (Signed)
Patient ID: Kathryn Short, female    DOB: 20-Oct-1921, 79 y.o.   MRN: [id number]  HPI Comments: Ms. Kathryn Short is an 79 year old woman with paroxysmal atrial fibrillation (last episode in March 2014), diastolic CHF on a low-dose diuretic 3 days per week, hypertension,  sick sinus syndrome, pacemaker placement followed by Dr. Graciela Husbands,  who presented to the hospital [facility] on 02/16/2012 in atrial fibrillation. She presents today for routine followup.  She currently lives with her daughter who does all of her medications. history of falls with trauma requiring stitches.  She is using a cane, reluctant to use a walker. No recent falls. Patient and daughter are afraid of her fall risk, other complications from anticoagulation. Currently not on anticoagulation at their request. They are aware of risks and benefits associated with arrhythmia and stroke.  She takes torsemide one half pill 3 days per week for edema and shortness of breath.  She wears TED hose Weight has been relatively stable. Daughter reports that blood pressure has been elevated, typically in the 150-160 range, rare 170 systolic. She takes nitroglycerin when necessary for high pressures. She takes one half clonidine pill in the evening  Previous lab work; Total cholesterol 189, LDL 104, hematocrit 38, creatinine 0.8  previous visits to the  emergency room with chest pain, dizziness, malaise. She had a problem on isosorbide in the past. Feelings of insomnia, and anxious feeling, chest pain episodes at night that wake her from sleep, weakness, nausea, dizziness. She has obstructive sleep apnea and does not use her BiPAP at nighttime  Previous episodes of abdominal pain have resolved, Not a complaint today.   Echocardiogram in the hospital showed ejection fraction 50-55%, moderate LVH, mild to moderate TR, right ventricular systolic pressure 40 mm of mercury, mild to moderate aortic valve insufficiency  EKG today shows  normal sinus rhythm  with rate 61 beats per minute,    Outpatient Encounter Prescriptions as of 04/13/2013  Medication Sig  . acetaminophen (TYLENOL) 500 MG tablet Takes 2-4 tablets daily.  Marland Kitchen amiodarone (PACERONE) 200 MG tablet Take 200 mg by mouth daily.   Marland Kitchen aspirin 81 MG tablet Take 162 mg by mouth daily.  Marland Kitchen b complex vitamins tablet Take 1 tablet by mouth daily.  . cloNIDine (CATAPRES) 0.1 MG tablet Take 1/2 tablet daily at bedtime.  . dicyclomine (BENTYL) 10 MG capsule Take 10 mg by mouth daily. As needed  . docusate sodium (COLACE) 100 MG capsule Take 100 mg by mouth 2 (two) times daily.   Marland Kitchen HYDROcodone-acetaminophen (VICODIN) 5-500 MG per tablet Take 1/2 tablet daily.  Marland Kitchen levothyroxine (SYNTHROID, LEVOTHROID) 50 MCG tablet Take 50 mcg by mouth daily.   Marland Kitchen losartan (COZAAR) 50 MG tablet Take 1 tablet (50 mg total) by mouth 2 (two) times daily.  . metoprolol tartrate (LOPRESSOR) 50 MG tablet Take 1 tablet (50 mg total) by mouth 2 (two) times daily.  . Multiple Vitamin (MULTIVITAMIN) tablet Take 1 tablet by mouth daily.  . nitroGLYCERIN (NITROSTAT) 0.4 MG SL tablet Place 1 tablet (0.4 mg total) under the tongue every 5 (five) minutes as needed for chest pain.  Marland Kitchen omeprazole (PRILOSEC) 20 MG capsule Take 20 mg by mouth as needed.   . potassium chloride (K-DUR) 10 MEQ tablet Take 1 tablet (10 mEq total) by mouth daily.  . Probiotic Product (PROBIOTIC DAILY PO) Take by mouth daily.  Marland Kitchen torsemide (DEMADEX) 20 MG tablet Take 1/2 tablet three times a week or as needed for increased swelling.  . [  DISCONTINUED] cloNIDine (CATAPRES) 0.1 MG tablet Take 1 tablet (0.1 mg total) by mouth 2 (two) times daily.  . [DISCONTINUED] torsemide (DEMADEX) 20 MG tablet Take 1 tablet (20 mg total) by mouth 2 (two) times daily as needed. Take 1/2 tablet if no weight gain. Take one tablet if 2 lbs or more weight gain.    Review of Systems  Constitutional: Negative.        Frequent falls  HENT: Negative.   Eyes: Negative.    Respiratory: Negative.        Increasing cough when supine  Cardiovascular: Negative.   Gastrointestinal: Negative.   Endocrine: Negative.   Musculoskeletal: Positive for gait problem.  Skin: Negative.   Allergic/Immunologic: Negative.   Neurological: Negative.   Hematological: Negative.   Psychiatric/Behavioral: Negative.   All other systems reviewed and are negative.    BP 140/68  Pulse 61  Ht 5\' 2"  (1.575 m)  Wt 140 lb (63.504 kg)  BMI 25.60 kg/m2  Physical Exam  Nursing note and vitals reviewed. Constitutional: She is oriented to person, place, and time. She appears well-developed and well-nourished.  HENT:  Head: Normocephalic.  Nose: Nose normal.  Mouth/Throat: Oropharynx is clear and moist.  Eyes: Conjunctivae are normal. Pupils are equal, round, and reactive to light.  Neck: Normal range of motion. Neck supple. No JVD present.  Cardiovascular: Normal rate, regular rhythm, S1 normal, S2 normal and intact distal pulses.  Exam reveals no gallop and no friction rub.   Murmur heard.  Systolic murmur is present with a grade of 2/6  Pulmonary/Chest: Effort normal and breath sounds normal. No respiratory distress. She has no wheezes. She has no rales. She exhibits no tenderness.  Abdominal: Soft. Bowel sounds are normal. She exhibits no distension. There is no tenderness.  Musculoskeletal: Normal range of motion. She exhibits no tenderness.  Lymphadenopathy:    She has no cervical adenopathy.  Neurological: She is alert and oriented to person, place, and time. Coordination normal.  Skin: Skin is warm and dry. No rash noted. No erythema.  Psychiatric: She has a normal mood and affect. Her behavior is normal. Judgment and thought content normal.    Assessment and Plan

## 2013-04-13 NOTE — Assessment & Plan Note (Signed)
Paced rhythm. History of atrial fibrillation. She and the daughter did not want anticoagulation

## 2013-04-13 NOTE — Assessment & Plan Note (Signed)
We have recommended that she take clonidine one half pill twice a day in addition to her medications including losartan 50 twice a day and metoprolol 50 twice a day. If blood pressure continues to run high, we will slowly increase the clonidine

## 2013-04-13 NOTE — Patient Instructions (Signed)
Please start 1/2 clonidine in the Am (9 Am) and PM (with dinner) Stay on the other medications  Please call us if you have new issues that need to be addressed before your next appt.  Your physician wants you to follow-up in: 1 month.

## 2013-04-13 NOTE — Assessment & Plan Note (Signed)
Followed by Dr. Klein 

## 2013-04-23 ENCOUNTER — Telehealth: Payer: Self-pay | Admitting: *Deleted

## 2013-04-23 NOTE — Telephone Encounter (Signed)
Clonidiene patient reports she is doing good on it.

## 2013-05-13 ENCOUNTER — Ambulatory Visit (INDEPENDENT_AMBULATORY_CARE_PROVIDER_SITE_OTHER): Payer: Medicare HMO | Admitting: Cardiovascular Disease

## 2013-05-13 ENCOUNTER — Encounter: Payer: Self-pay | Admitting: Cardiovascular Disease

## 2013-05-13 VITALS — BP 110/60 | HR 61 | Ht 62.0 in | Wt 148.5 lb

## 2013-05-13 DIAGNOSIS — I4891 Unspecified atrial fibrillation: Secondary | ICD-10-CM

## 2013-05-13 DIAGNOSIS — R0602 Shortness of breath: Secondary | ICD-10-CM

## 2013-05-13 DIAGNOSIS — I5033 Acute on chronic diastolic (congestive) heart failure: Secondary | ICD-10-CM

## 2013-05-13 DIAGNOSIS — I509 Heart failure, unspecified: Secondary | ICD-10-CM

## 2013-05-13 DIAGNOSIS — I10 Essential (primary) hypertension: Secondary | ICD-10-CM

## 2013-05-13 DIAGNOSIS — Z950 Presence of cardiac pacemaker: Secondary | ICD-10-CM

## 2013-05-13 DIAGNOSIS — R079 Chest pain, unspecified: Secondary | ICD-10-CM

## 2013-05-13 MED ORDER — LOSARTAN POTASSIUM 100 MG PO TABS: 100.0000 mg | ORAL_TABLET | Freq: Every day | ORAL | Status: DC

## 2013-05-13 NOTE — Assessment & Plan Note (Signed)
She's not on anticoagulation at the patient and daughter's wishes. High fall risk. They are aware of the risks and benefits of anticoagulation.

## 2013-05-13 NOTE — Progress Notes (Signed)
Patient ID: Kathryn Short, female    DOB: [birth year], 79 y.o.   MRN: [id number]  HPI Comments: Kathryn Short is an 79 year old woman with paroxysmal atrial fibrillation (last episode in March 2014), diastolic CHF on a low-dose diuretic 3 days per week, hypertension,  sick sinus syndrome, pacemaker placement followed by Dr. Graciela Husbands,  who presented to the hospital [facility] on 02/16/2012 in atrial fibrillation. She presents today for routine followup.  She currently lives with her daughter who does all of her medications. history of falls with trauma requiring stitches.  She is using a cane, reluctant to use a walker. No recent falls. Patient and daughter are afraid of her fall risk, other complications from anticoagulation. Currently not on anticoagulation at their request. They are aware of risks and benefits associated with arrhythmia and stroke.  She takes torsemide one half pill 3 days per week for edema and shortness of breath.  She wears TED hose Weight has been relatively stable. In followup today, blood pressure continues to run high, particularly in the morning . She takes losartan 50 mg twice a day, clonidine 0.1 mg one half pill in the morning and at dinner,  Also metoprolol twice a day. Otherwise she feels well today with no complaints. Heart rate is typically in the 60-80 range  Previous lab work; Total cholesterol 189, LDL 104, hematocrit 38, creatinine 0.8  previous visits to the  emergency room with chest pain, dizziness, malaise. She had a problem on isosorbide in the past. Feelings of insomnia, and anxious feeling, chest pain episodes at night that wake her from sleep, weakness, nausea, dizziness. She has obstructive sleep apnea and does not use her BiPAP at nighttime  Echocardiogram in the hospital showed ejection fraction 50-55%, moderate LVH, mild to moderate TR, right ventricular systolic pressure 40 mm of mercury, mild to moderate aortic valve insufficiency  EKG today shows   normal sinus rhythm with no significant ST or T wave changes   Outpatient Encounter Prescriptions as of 05/13/2013  Medication Sig  . acetaminophen (TYLENOL) 500 MG tablet Takes 2-4 tablets daily.  Marland Kitchen amiodarone (PACERONE) 200 MG tablet Take 200 mg by mouth daily.   Marland Kitchen aspirin 81 MG tablet Take 162 mg by mouth daily.  Marland Kitchen b complex vitamins tablet Take 1 tablet by mouth daily.  . cloNIDine (CATAPRES) 0.1 MG tablet Take 1/2 tablet twice a day.  . dicyclomine (BENTYL) 10 MG capsule Take 10 mg by mouth daily. As needed  . docusate sodium (COLACE) 100 MG capsule Take 100 mg by mouth 2 (two) times daily.   Marland Kitchen HYDROcodone-acetaminophen (VICODIN) 5-500 MG per tablet Take 1/2 tablet daily.  Marland Kitchen levothyroxine (SYNTHROID, LEVOTHROID) 50 MCG tablet Take 50 mcg by mouth daily.   Marland Kitchen losartan (COZAAR) 100 MG tablet Take 1 tablet (100 mg total) by mouth daily.  . metoprolol tartrate (LOPRESSOR) 50 MG tablet Take 1 tablet (50 mg total) by mouth 2 (two) times daily.  . Multiple Vitamin (MULTIVITAMIN) tablet Take 1 tablet by mouth daily.  . nitroGLYCERIN (NITROSTAT) 0.4 MG SL tablet Place 1 tablet (0.4 mg total) under the tongue every 5 (five) minutes as needed for chest pain.  Marland Kitchen omeprazole (PRILOSEC) 20 MG capsule Take 20 mg by mouth as needed.   . potassium chloride (K-DUR) 10 MEQ tablet Take 10 mEq by mouth 3 (three) times a week.  . Probiotic Product (PROBIOTIC DAILY PO) Take by mouth daily.  Marland Kitchen torsemide (DEMADEX) 20 MG tablet Take 1/2 tablet three times  a week or as needed for increased swelling.  . [DISCONTINUED] losartan (COZAAR) 50 MG tablet Take 1 tablet (50 mg total) by mouth 2 (two) times daily.  . [DISCONTINUED] potassium chloride (K-DUR) 10 MEQ tablet Take 1 tablet (10 mEq total) by mouth daily.    Review of Systems  Constitutional: Negative.        Frequent falls  HENT: Negative.   Eyes: Negative.   Respiratory: Negative.        Increasing cough when supine  Cardiovascular: Negative.    Gastrointestinal: Negative.   Endocrine: Negative.   Musculoskeletal: Positive for gait problem.  Skin: Negative.   Allergic/Immunologic: Negative.   Neurological: Negative.   Hematological: Negative.   Psychiatric/Behavioral: Negative.   All other systems reviewed and are negative.    BP 110/60  Pulse 61  Ht 5\' 2"  (1.575 m)  Wt 148 lb 8 oz (67.359 kg)  BMI 27.15 kg/m2  Physical Exam  Nursing note and vitals reviewed. Constitutional: She is oriented to person, place, and time. She appears well-developed and well-nourished.  HENT:  Head: Normocephalic.  Nose: Nose normal.  Mouth/Throat: Oropharynx is clear and moist.  Eyes: Conjunctivae are normal. Pupils are equal, round, and reactive to light.  Neck: Normal range of motion. Neck supple. No JVD present.  Cardiovascular: Normal rate, regular rhythm, S1 normal, S2 normal and intact distal pulses.  Exam reveals no gallop and no friction rub.   Murmur heard.  Systolic murmur is present with a grade of 2/6  Pulmonary/Chest: Effort normal and breath sounds normal. No respiratory distress. She has no wheezes. She has no rales. She exhibits no tenderness.  Abdominal: Soft. Bowel sounds are normal. She exhibits no distension. There is no tenderness.  Musculoskeletal: Normal range of motion. She exhibits no tenderness.  Lymphadenopathy:    She has no cervical adenopathy.  Neurological: She is alert and oriented to person, place, and time. Coordination normal.  Skin: Skin is warm and dry. No rash noted. No erythema.  Psychiatric: She has a normal mood and affect. Her behavior is normal. Judgment and thought content normal.    Assessment and Plan

## 2013-05-13 NOTE — Patient Instructions (Addendum)
Please add 1/2 clonidine pill at 10 pm Continue to monitor BP, If it runs high,  A full clonidine could be used   Take pepcid or zantac (generic), 2 pills at a time up to twice a day as needed, for cough from acid, heartburn  Please call us if you have new issues that need to be addressed before your next appt.  Your physician wants you to follow-up in: 1 month.

## 2013-05-13 NOTE — Assessment & Plan Note (Signed)
Blood pressure continues to run high. We will add additional half clonidine each bedtime in addition to half clonidine in the morning and dinner. Continue losartan twice a day, metoprolol twice a day

## 2013-05-13 NOTE — Assessment & Plan Note (Addendum)
Followed by Dr. Klein 

## 2013-05-13 NOTE — Assessment & Plan Note (Signed)
We have suggested she stay on her torsemide 3 times per week. No significant edema on today's visit

## 2013-05-27 ENCOUNTER — Other Ambulatory Visit: Payer: Self-pay | Admitting: *Deleted

## 2013-05-27 MED ORDER — METOPROLOL TARTRATE 50 MG PO TABS: 50.0000 mg | ORAL_TABLET | Freq: Two times a day (BID) | ORAL | Status: DC

## 2013-05-27 NOTE — Telephone Encounter (Signed)
Requested Prescriptions   Signed Prescriptions Disp Refills  . metoprolol (LOPRESSOR) 50 MG tablet 180 tablet 3    Sig: Take 1 tablet (50 mg total) by mouth 2 (two) times daily.    Authorizing Provider: GOLLAN, TIMOTHY J    Ordering User: Adonis Yim C    

## 2013-06-15 ENCOUNTER — Encounter: Payer: Self-pay | Admitting: Cardiovascular Disease

## 2013-06-15 ENCOUNTER — Ambulatory Visit (INDEPENDENT_AMBULATORY_CARE_PROVIDER_SITE_OTHER): Payer: Medicare HMO | Admitting: Cardiovascular Disease

## 2013-06-15 VITALS — BP 130/64 | HR 64 | Ht 62.0 in | Wt 142.0 lb

## 2013-06-15 DIAGNOSIS — I5033 Acute on chronic diastolic (congestive) heart failure: Secondary | ICD-10-CM

## 2013-06-15 DIAGNOSIS — I10 Essential (primary) hypertension: Secondary | ICD-10-CM

## 2013-06-15 DIAGNOSIS — I509 Heart failure, unspecified: Secondary | ICD-10-CM

## 2013-06-15 DIAGNOSIS — I4891 Unspecified atrial fibrillation: Secondary | ICD-10-CM

## 2013-06-15 MED ORDER — CLONIDINE HCL 0.1 MG PO TABS: 0.1000 mg | ORAL_TABLET | Freq: Three times a day (TID) | ORAL | Status: DC

## 2013-06-15 MED ORDER — LOSARTAN POTASSIUM 100 MG PO TABS: 100.0000 mg | ORAL_TABLET | Freq: Every day | ORAL | Status: DC

## 2013-06-15 NOTE — Progress Notes (Signed)
Patient ID: [name], female    DOB: 1921-06-13, 80 y.o.   MRN: [id number]  HPI Comments: Ms. Kathryn Short is an 80 year old woman with paroxysmal atrial fibrillation (last episode in March 4098), diastolic CHF on a low-dose diuretic 3 days per week, hypertension,  sick sinus syndrome, pacemaker placement followed by Dr. Caryl Comes,  who presented to the hospital [facility] on 02/16/2012 in atrial fibrillation. She presents today for routine followup.  She currently lives with her daughter who does all of her medications. history of falls with trauma requiring stitches.  She is using a cane, reluctant to use a walker. No recent falls. Patient and daughter are afraid of her fall risk, other complications from anticoagulation. Currently not on anticoagulation at their request. They are aware of risks and benefits associated with arrhythmia and stroke.  She takes torsemide one half pill 3 days per week for edema and shortness of breath.  She wears TED hose Weight has been relatively stable.  In followup today, blood pressure continues to run high, now almost all day . Blood pressure typically running 119 to 147 systolic . She takes losartan 50 mg twice a day, clonidine 0.1 mg one half pill 3 times a day, metoprolol twice a day. Otherwise she feels well today with no complaints. Heart rate is typically in the 60-80 range  Previous lab work; Total cholesterol 189, LDL 104, hematocrit 38, creatinine 0.8  previous visits to the  emergency room with chest pain, dizziness, malaise. She had a problem on isosorbide in the past. Feelings of insomnia, and anxious feeling, chest pain episodes at night that wake her from sleep, weakness, nausea, dizziness. She has obstructive sleep apnea and does not use her BiPAP at nighttime  Echocardiogram in the hospital showed ejection fraction 50-55%, moderate LVH, mild to moderate TR, right ventricular systolic pressure 40 mm of mercury, mild to moderate aortic valve  insufficiency  EKG today shows  normal sinus rhythm with rate 64 beats per minute with no significant ST or T wave changes   Outpatient Encounter Prescriptions as of 06/15/2013  Medication Sig  . acetaminophen (TYLENOL) 500 MG tablet Takes 2-4 tablets daily.  Marland Kitchen amiodarone (PACERONE) 200 MG tablet Take 200 mg by mouth daily.   Marland Kitchen aspirin 81 MG tablet Take 162 mg by mouth daily.  Marland Kitchen b complex vitamins tablet Take 1 tablet by mouth daily.  . cloNIDine (CATAPRES) 0.1 MG tablet Take 1/2 tablet three times a day.  . dicyclomine (BENTYL) 10 MG capsule Take 10 mg by mouth daily. As needed  . docusate sodium (COLACE) 100 MG capsule Take 100 mg by mouth 2 (two) times daily.   Marland Kitchen HYDROcodone-acetaminophen (VICODIN) 5-500 MG per tablet Take 1/2 tablet daily.  Marland Kitchen levothyroxine (SYNTHROID, LEVOTHROID) 50 MCG tablet Take 50 mcg by mouth daily.   Marland Kitchen losartan (COZAAR) 50 MG tablet Take 50 mg by mouth 2 (two) times daily.  . metoprolol (LOPRESSOR) 50 MG tablet Take 1 tablet (50 mg total) by mouth 2 (two) times daily.  . Multiple Vitamin (MULTIVITAMIN) tablet Take 1 tablet by mouth daily.  . nitroGLYCERIN (NITROSTAT) 0.4 MG SL tablet Place 1 tablet (0.4 mg total) under the tongue every 5 (five) minutes as needed for chest pain.  Marland Kitchen omeprazole (PRILOSEC) 20 MG capsule Take 20 mg by mouth as needed.   . potassium chloride (K-DUR) 10 MEQ tablet Take 10 mEq by mouth 3 (three) times a week.  . Probiotic Product (PROBIOTIC DAILY PO) Take by mouth daily.  Marland Kitchen  torsemide (DEMADEX) 20 MG tablet Take 1/2 tablet three times a week or as needed for increased swelling.  . [DISCONTINUED] losartan (COZAAR) 100 MG tablet Take 1 tablet (100 mg total) by mouth daily.    Review of Systems  Constitutional:       Frequent falls  HENT: Negative.   Eyes: Negative.   Respiratory: Negative.   Cardiovascular: Negative.   Gastrointestinal: Negative.   Endocrine: Negative.   Musculoskeletal: Positive for gait problem.  Skin: Negative.    Allergic/Immunologic: Negative.   Neurological: Positive for weakness.  Hematological: Negative.   Psychiatric/Behavioral: Negative.   All other systems reviewed and are negative.    BP 130/64  Pulse 64  Ht 5\' 2"  (1.575 m)  Wt 142 lb (64.411 kg)  BMI 25.97 kg/m2  Physical Exam  Nursing note and vitals reviewed. Constitutional: She is oriented to person, place, and time. She appears well-developed and well-nourished.  HENT:  Head: Normocephalic.  Nose: Nose normal.  Mouth/Throat: Oropharynx is clear and moist.  Eyes: Conjunctivae are normal. Pupils are equal, round, and reactive to light.  Neck: Normal range of motion. Neck supple. No JVD present.  Cardiovascular: Normal rate, regular rhythm, S1 normal, S2 normal and intact distal pulses.  Exam reveals no gallop and no friction rub.   Murmur heard.  Systolic murmur is present with a grade of 2/6  Pulmonary/Chest: Effort normal and breath sounds normal. No respiratory distress. She has no wheezes. She has no rales. She exhibits no tenderness.  Abdominal: Soft. Bowel sounds are normal. She exhibits no distension. There is no tenderness.  Musculoskeletal: Normal range of motion. She exhibits no edema and no tenderness.  Lymphadenopathy:    She has no cervical adenopathy.  Neurological: She is alert and oriented to person, place, and time. Coordination normal.  Skin: Skin is warm and dry. No rash noted. No erythema.  Psychiatric: She has a normal mood and affect. Her behavior is normal. Judgment and thought content normal.    Assessment and Plan

## 2013-06-15 NOTE — Assessment & Plan Note (Signed)
Stable on diuretic 3 times per week. No significant changes from her prior clinic visit

## 2013-06-15 NOTE — Assessment & Plan Note (Addendum)
Blood pressure continues to run very high on today's visit over the past week or so. We will increase clonidine up to 0.1 mg twice a day with one half dose in the afternoon. If pressure continues to run high, could slowly increase the clonidine dosing, 0.1 mg 3 times a day, then 1-1/2 pills 3 times a day. We have suggested she continue her losartan 50 mg twice a day with metoprolol twice a day

## 2013-06-15 NOTE — Patient Instructions (Signed)
Please increase the clonidine in the Am and 10 pm to a full clonidine pill Continue 1/2 clonidine in the afternoon Continue metoprolol and losartan  Take an extra torsemide if pressures continue to run really high, or for leg edema  Please call us if you have new issues that need to be addressed before your next appt.  Your physician wants you to follow-up in: 1 month.

## 2013-06-24 ENCOUNTER — Telehealth: Payer: Self-pay

## 2013-06-24 NOTE — Telephone Encounter (Signed)
Spoke w/ pt's daughter, Quitman Livings.  She reports that pt's BP has been running low since clonidine dose was adjusted at last visit.  Reports yesterday:  am: 153/73, took whole clonidine;  5:30: 105/56,  7:00 126/61 took 1/2 clonidine; before bed 136/62 took whole clonidine. Today:   9:00: 157/75 took whole clonidine; 12:00: 106/55 took 1/2 clonidine; 12:30 112/59; 5:30: 126/68 Lelon Frohlich reports that she checks pt's BP before giving meds. Advised her that if pt's systolic pressures are >176, to give a whole clonidine, otherwise continue 1/2 pill as she was doing before. She is agreeable to this.  Advised her to monitor pt's BP and call me w/ update on pt's BP in a few days.  She also reports that pt has been having crazy dreams the past few nights and wonders if this is r/t pt's recently diagnosed UTI. Advised her to have pt complete her abx regimen and let us know if this has not resolved.

## 2013-07-14 ENCOUNTER — Telehealth: Payer: Self-pay

## 2013-07-14 NOTE — Telephone Encounter (Signed)
Attempted to contact pt's daughter regarding BP records that were dropped off in the office last month and to check on pt's status. No answer at pt's home number and Ann's number has been disconnected. Will scan info into chart.

## 2013-07-22 ENCOUNTER — Ambulatory Visit (INDEPENDENT_AMBULATORY_CARE_PROVIDER_SITE_OTHER): Payer: Medicare HMO | Admitting: Cardiovascular Disease

## 2013-07-22 ENCOUNTER — Encounter: Payer: Self-pay | Admitting: Cardiovascular Disease

## 2013-07-22 VITALS — BP 102/58 | HR 66 | Ht 62.0 in | Wt 143.5 lb

## 2013-07-22 DIAGNOSIS — I10 Essential (primary) hypertension: Secondary | ICD-10-CM

## 2013-07-22 DIAGNOSIS — I5033 Acute on chronic diastolic (congestive) heart failure: Secondary | ICD-10-CM

## 2013-07-22 DIAGNOSIS — I509 Heart failure, unspecified: Secondary | ICD-10-CM

## 2013-07-22 NOTE — Patient Instructions (Signed)
You are doing well. No medication changes were made.  Monitor for low blood pressure Ok to cut clonidine in 1/2 for low number  Please call us if you have new issues that need to be addressed before your next appt.  Your physician wants you to follow-up in: 1 month.

## 2013-07-22 NOTE — Assessment & Plan Note (Signed)
Blood pressure running low this morning. Suggested she hold the noon clonidine. Monitor her the blood pressure and call for low blood pressure. Blood pressure seems to be very labile at times. Etiology is not clear. In general is running high.

## 2013-07-22 NOTE — Assessment & Plan Note (Signed)
Stable on diuretic 3 times per week. No significant changes from her prior clinic visit 

## 2013-07-22 NOTE — Progress Notes (Signed)
Patient ID: Kathryn Short, female    DOB: 04-23-1922, 80 y.o.   MRN: [id number]  HPI Comments: Ms. Kathryn Short is an 80 year old woman with paroxysmal atrial fibrillation (last episode in March 0865), diastolic CHF on a low-dose diuretic 3 days per week, hypertension,  sick sinus syndrome, pacemaker placement followed by Dr. Caryl Comes,  who presented to the hospital [facility] on 02/16/2012 in atrial fibrillation. She presents today for routine followup.  In followup today, blood pressure is low this morning. She did have some dizziness this morning. Daughter presents with her reports that blood pressure has typically been elevated in the 150 range, occasionally higher. Very occasional low blood pressures, labile at times. Otherwise she is doing well.  history of falls with trauma requiring stitches.  She is using a cane, reluctant to use a walker. No recent falls. Patient and daughter are afraid of her fall risk, other complications from anticoagulation. Currently not on anticoagulation at their request. They are aware of risks and benefits associated with arrhythmia and stroke.  She takes torsemide one half pill 3 days per week for edema and shortness of breath.  She wears TED hose Weight has been relatively stable.  Previous lab work; Total cholesterol 189, LDL 104, hematocrit 38, creatinine 0.8  previous visits to the  emergency room with chest pain, dizziness, malaise. She had a problem on isosorbide in the past. Feelings of insomnia, and anxious feeling, chest pain episodes at night that wake her from sleep, weakness, nausea, dizziness. She has obstructive sleep apnea and does not use her BiPAP at nighttime  Echocardiogram in the hospital showed ejection fraction 50-55%, moderate LVH, mild to moderate TR, right ventricular systolic pressure 40 mm of mercury, mild to moderate aortic valve insufficiency    Outpatient Encounter Prescriptions as of 07/22/2013  Medication Sig  . acetaminophen (TYLENOL)  500 MG tablet Takes 2-4 tablets daily.  Marland Kitchen amiodarone (PACERONE) 200 MG tablet Take 200 mg by mouth daily.   Marland Kitchen aspirin 81 MG tablet Take 162 mg by mouth daily.  Marland Kitchen b complex vitamins tablet Take 1 tablet by mouth daily.  . cloNIDine (CATAPRES) 0.1 MG tablet Take 1 tablet (0.1 mg total) by mouth 3 (three) times daily.  Marland Kitchen dicyclomine (BENTYL) 10 MG capsule Take 10 mg by mouth daily. As needed  . docusate sodium (COLACE) 100 MG capsule Take 100 mg by mouth 2 (two) times daily.   Marland Kitchen HYDROcodone-acetaminophen (VICODIN) 5-500 MG per tablet Take 1/2 tablet daily.  Marland Kitchen levothyroxine (SYNTHROID, LEVOTHROID) 50 MCG tablet Take 50 mcg by mouth daily.   Marland Kitchen losartan (COZAAR) 100 MG tablet Take 1 tablet (100 mg total) by mouth daily.  . metoprolol (LOPRESSOR) 50 MG tablet Take 1 tablet (50 mg total) by mouth 2 (two) times daily.  . Multiple Vitamin (MULTIVITAMIN) tablet Take 1 tablet by mouth daily.  . nitroGLYCERIN (NITROSTAT) 0.4 MG SL tablet Place 1 tablet (0.4 mg total) under the tongue every 5 (five) minutes as needed for chest pain.  Marland Kitchen omeprazole (PRILOSEC) 20 MG capsule Take 20 mg by mouth as needed.   . potassium chloride (K-DUR) 10 MEQ tablet Take 10 mEq by mouth 3 (three) times a week.  . Probiotic Product (PROBIOTIC DAILY PO) Take by mouth daily.  Marland Kitchen torsemide (DEMADEX) 20 MG tablet Take 1/2 tablet three times a week or as needed for increased swelling.    Review of Systems  Constitutional:       Frequent falls  HENT: Negative.   Eyes:  Negative.   Respiratory: Negative.   Cardiovascular: Negative.   Gastrointestinal: Negative.   Endocrine: Negative.   Musculoskeletal: Positive for gait problem.  Skin: Negative.   Allergic/Immunologic: Negative.   Neurological: Positive for weakness.  Hematological: Negative.   Psychiatric/Behavioral: Negative.   All other systems reviewed and are negative.    BP 102/58  Pulse 66  Ht 5\' 2"  (1.575 m)  Wt 143 lb 8 oz (65.091 kg)  BMI 26.24  kg/m2  Physical Exam  Nursing note and vitals reviewed. Constitutional: She is oriented to person, place, and time. She appears well-developed and well-nourished.  HENT:  Head: Normocephalic.  Nose: Nose normal.  Mouth/Throat: Oropharynx is clear and moist.  Eyes: Conjunctivae are normal. Pupils are equal, round, and reactive to light.  Neck: Normal range of motion. Neck supple. No JVD present.  Cardiovascular: Normal rate, regular rhythm, S1 normal, S2 normal and intact distal pulses.  Exam reveals no gallop and no friction rub.   Murmur heard.  Systolic murmur is present with a grade of 2/6  Pulmonary/Chest: Effort normal and breath sounds normal. No respiratory distress. She has no wheezes. She has no rales. She exhibits no tenderness.  Abdominal: Soft. Bowel sounds are normal. She exhibits no distension. There is no tenderness.  Musculoskeletal: Normal range of motion. She exhibits no edema and no tenderness.  Lymphadenopathy:    She has no cervical adenopathy.  Neurological: She is alert and oriented to person, place, and time. Coordination normal.  Skin: Skin is warm and dry. No rash noted. No erythema.  Psychiatric: She has a normal mood and affect. Her behavior is normal. Judgment and thought content normal.    Assessment and Plan

## 2013-07-23 ENCOUNTER — Telehealth: Payer: Self-pay

## 2013-07-23 NOTE — Telephone Encounter (Signed)
Spoke w/ Lelon Frohlich, pt's daughter.  She reports that pt's BP has been low, but she is concerned that pt's pulse has been so high, as this is new onset since this am. She is concerned that pt may convert back to afib and she would like to know if any of pt's meds need to be changed. Advised there that I will make Dr. Rockey Situ aware and will call her back w/ his recommendation. She is agreeable to bringing pt in for EKG if needed.

## 2013-07-23 NOTE — Telephone Encounter (Signed)
Spoke w/ Dr. Rockey Situ and relayed his suggestion to [place]. Advised pt to hold losartan, take clonidine BID instead of TID, and take an extra 1/2 metoprolol if HR >100 (hold metop if <100).  Also advised her that Dr. Rockey Situ suspects pt may be coming down w/ a "bug", to make sure that pt has plenty of fluids, and that she can take Tylenol if needed.  She is agreeable to this and will contact us w/ any further questions or concerns.

## 2013-07-23 NOTE — Telephone Encounter (Signed)
Pt daughter called and states pt BP is running low 100,118. States pulse is 128,121. States now her pulse is 88. She is not sure if her medication should be adjusted. Please call.

## 2013-07-24 ENCOUNTER — Telehealth: Payer: Self-pay | Admitting: *Deleted

## 2013-07-24 NOTE — Telephone Encounter (Signed)
Please call Webb Silversmith patient's daughter. She would like to know if she can give Kathryn Short a whole tablet of Clonidine at night due to bp high in the morning. This morning her bp was 188/87  @ 6 am plus was 60. Please advise

## 2013-07-24 NOTE — Telephone Encounter (Signed)
Spoke w/ pt's daughter.  Advised her that per Dr. Rockey Situ, is it okay to give whole clonidine at night, and as she is paying such close attention to her mother's readings, it is okay for her to make small adjustments as needed. She is agreeable to this and will call w/ further questions or concerns.

## 2013-08-19 ENCOUNTER — Ambulatory Visit (INDEPENDENT_AMBULATORY_CARE_PROVIDER_SITE_OTHER): Payer: Medicare HMO | Admitting: Cardiovascular Disease

## 2013-08-19 ENCOUNTER — Encounter: Payer: Self-pay | Admitting: Cardiovascular Disease

## 2013-08-19 VITALS — BP 130/60 | HR 68 | Ht 62.0 in | Wt 145.0 lb

## 2013-08-19 DIAGNOSIS — Z9181 History of falling: Secondary | ICD-10-CM

## 2013-08-19 DIAGNOSIS — R296 Repeated falls: Secondary | ICD-10-CM

## 2013-08-19 DIAGNOSIS — I10 Essential (primary) hypertension: Secondary | ICD-10-CM

## 2013-08-19 MED ORDER — LOSARTAN POTASSIUM 100 MG PO TABS
100.0000 mg | ORAL_TABLET | Freq: Every day | ORAL | Status: DC
Start: 2013-08-19 — End: 2013-10-27

## 2013-08-19 NOTE — Assessment & Plan Note (Signed)
we have recommended she increase the losartan up to 100 mg at 10 PM. If blood pressure continues to run high in the morning, additional half clonidine could be used either at 10 PM or immediately in the morning

## 2013-08-19 NOTE — Assessment & Plan Note (Signed)
No recent falls. Walks with her daughter, uses a cane. Does not like to use a walker

## 2013-08-19 NOTE — Patient Instructions (Addendum)
You are doing well. Please increase the losartan back to 100 mg before bed  Please call us if you have new issues that need to be addressed before your next appt.  Your physician wants you to follow-up in: 1 month.

## 2013-08-19 NOTE — Progress Notes (Signed)
Patient ID: CATHELEEN LANGHORNE, female    DOB: [birth year], 80 y.o.   MRN: [id number]  HPI Comments: Ms. Wurster is an 80 year old woman with paroxysmal atrial fibrillation (last episode in March 4098), diastolic CHF on a low-dose diuretic 3 days per week, hypertension,  sick sinus syndrome, pacemaker placement followed by Dr. Caryl Comes,  who presented to the hospital [facility] on 02/16/2012 in atrial fibrillation. She presents today for routine followup.  In followup today, blood pressure elevated in the morning. Unsteady gait at baseline. No recent falls She is currently taking one half clonidine in the morning, at 5 PM, full clonidine pill at 10 PM with losartan 50 mg at bedtime history of falls with trauma requiring stitches.  She is using a cane, reluctant to use a walker. No recent falls. Patient and daughter are afraid of her fall risk, other complications from anticoagulation. Currently not on anticoagulation at their request. They are aware of risks and benefits associated with arrhythmia and stroke.  She takes torsemide one half pill 3 days per week for edema and shortness of breath.  She wears TED hose Weight has been relatively stable.  Previous lab work; Total cholesterol 189, LDL 104, hematocrit 38, creatinine 0.8  previous visits to the  emergency room with chest pain, dizziness, malaise. She had a problem on isosorbide in the past. Feelings of insomnia, and anxious feeling, chest pain episodes at night that wake her from sleep, weakness, nausea, dizziness. She has obstructive sleep apnea and does not use her BiPAP at nighttime  Echocardiogram in the hospital showed ejection fraction 50-55%, moderate LVH, mild to moderate TR, right ventricular systolic pressure 40 mm of mercury, mild to moderate aortic valve insufficiency    Outpatient Encounter Prescriptions as of 08/19/2013  Medication Sig  . acetaminophen (TYLENOL) 500 MG tablet Takes 2-4 tablets daily.  Marland Kitchen amiodarone (PACERONE) 200 MG  tablet Take 200 mg by mouth daily.   Marland Kitchen aspirin 81 MG tablet Take 162 mg by mouth daily.  Marland Kitchen b complex vitamins tablet Take 1 tablet by mouth daily.  . cloNIDine (CATAPRES) 0.1 MG tablet Take 1/2 tablet am, 1/2 at noon and 0.1 mg in the pm.  . dicyclomine (BENTYL) 10 MG capsule Take 10 mg by mouth daily. As needed  . docusate sodium (COLACE) 100 MG capsule Take 100 mg by mouth 2 (two) times daily.   Marland Kitchen HYDROcodone-acetaminophen (VICODIN) 5-500 MG per tablet Take 1/2 tablet daily.  Marland Kitchen levothyroxine (SYNTHROID, LEVOTHROID) 50 MCG tablet Take 50 mcg by mouth daily.   Marland Kitchen losartan (COZAAR) 100 MG tablet Take 50 mg by mouth daily.  . metoprolol (LOPRESSOR) 50 MG tablet Take 1 tablet (50 mg total) by mouth 2 (two) times daily.  . Multiple Vitamin (MULTIVITAMIN) tablet Take 1 tablet by mouth daily.  . nitrofurantoin, macrocrystal-monohydrate, (MACROBID) 100 MG capsule Take 100 mg by mouth 2 (two) times daily.  . nitroGLYCERIN (NITROSTAT) 0.4 MG SL tablet Place 1 tablet (0.4 mg total) under the tongue every 5 (five) minutes as needed for chest pain.  . potassium chloride (K-DUR) 10 MEQ tablet Take 10 mEq by mouth 3 (three) times a week.  . Probiotic Product (PROBIOTIC DAILY PO) Take by mouth daily.  Marland Kitchen torsemide (DEMADEX) 20 MG tablet Take 1/2 tablet three times a week or as needed for increased swelling.   Review of Systems  Constitutional:       Frequent falls  HENT: Negative.   Eyes: Negative.   Respiratory: Negative.   Cardiovascular:  Negative.   Gastrointestinal: Negative.   Endocrine: Negative.   Musculoskeletal: Positive for gait problem.  Skin: Negative.   Allergic/Immunologic: Negative.   Hematological: Negative.   Psychiatric/Behavioral: Negative.   All other systems reviewed and are negative.    BP 130/60  Pulse 68  Ht 5\' 2"  (1.575 m)  Wt 145 lb (65.772 kg)  BMI 26.51 kg/m2  Physical Exam  Nursing note and vitals reviewed. Constitutional: She is oriented to person, place, and  time. She appears well-developed and well-nourished.  HENT:  Head: Normocephalic.  Nose: Nose normal.  Mouth/Throat: Oropharynx is clear and moist.  Eyes: Conjunctivae are normal. Pupils are equal, round, and reactive to light.  Neck: Normal range of motion. Neck supple. No JVD present.  Cardiovascular: Normal rate, regular rhythm, S1 normal, S2 normal and intact distal pulses.  Exam reveals no gallop and no friction rub.   Murmur heard.  Systolic murmur is present with a grade of 2/6  Pulmonary/Chest: Effort normal and breath sounds normal. No respiratory distress. She has no wheezes. She has no rales. She exhibits no tenderness.  Abdominal: Soft. Bowel sounds are normal. She exhibits no distension. There is no tenderness.  Musculoskeletal: Normal range of motion. She exhibits no edema and no tenderness.  Lymphadenopathy:    She has no cervical adenopathy.  Neurological: She is alert and oriented to person, place, and time. Coordination normal.  Skin: Skin is warm and dry. No rash noted. No erythema.  Psychiatric: She has a normal mood and affect. Her behavior is normal. Judgment and thought content normal.    Assessment and Plan

## 2013-08-27 ENCOUNTER — Encounter: Payer: Self-pay | Admitting: Internal Medicine

## 2013-09-01 ENCOUNTER — Telehealth: Payer: Self-pay | Admitting: *Deleted

## 2013-09-01 NOTE — Telephone Encounter (Signed)
Patient's daughter called and not sure when to see you again. Please let me know and I will call Ann. Thanks

## 2013-09-01 NOTE — Telephone Encounter (Signed)
An appointment was made for 4-7 in [place]. Ann voiced understanding.

## 2013-09-02 ENCOUNTER — Telehealth: Payer: Self-pay

## 2013-09-02 NOTE — Telephone Encounter (Signed)
Pt daughter called and states HR at 12 noon, 61, at 8:30 pm was 110. Has question regarding metoprolol.

## 2013-09-02 NOTE — Telephone Encounter (Signed)
Spoke w/ Lelon Frohlich.  She reports that pt's HR has been up and down over the past few days, w/ a low of 61 and a few hrs later up to 119. Reports that she called our answering service last night and was instructed to go ahead and give pt her metoprolol, as it was hour before pt normally takes this.  Pt was advised at her last ov to take extra 1/2 metoprolol if her HR was elevated and she would like to know the time frame of when she can take this in relation to her whole metoprolol.  Advised her of Dr. Donivan Scull recommendation that if is close to time for her next pill, to just take a whole metoprolol and resume her schedule. However, if its several hrs b/t doses and HR >100, to go ahead and take 1/2 pill.  She is agreeable to this and will call w/ further questions or concerns.

## 2013-09-15 ENCOUNTER — Ambulatory Visit (INDEPENDENT_AMBULATORY_CARE_PROVIDER_SITE_OTHER): Payer: Medicare HMO | Admitting: *Deleted

## 2013-09-15 ENCOUNTER — Encounter: Payer: Self-pay | Admitting: Internal Medicine

## 2013-09-15 DIAGNOSIS — I495 Sick sinus syndrome: Secondary | ICD-10-CM

## 2013-09-15 DIAGNOSIS — I4891 Unspecified atrial fibrillation: Secondary | ICD-10-CM

## 2013-09-15 LAB — MDC_IDC_ENUM_SESS_TYPE_INCLINIC
Battery Remaining Longevity: 32 mo
Brady Statistic AP VP Percent: 0 %
Date Time Interrogation Session: 20150407151026
Lead Channel Impedance Value: 381 Ohm
Lead Channel Pacing Threshold Pulse Width: 0.4 ms
Lead Channel Sensing Intrinsic Amplitude: 8 mV
Lead Channel Setting Pacing Amplitude: 2 V
Lead Channel Setting Pacing Amplitude: 2.5 V
Lead Channel Setting Sensing Sensitivity: 2.8 mV
MDC IDC MSMT BATTERY IMPEDANCE: 1569 Ohm
MDC IDC MSMT BATTERY VOLTAGE: 2.75 V
MDC IDC MSMT LEADCHNL RA PACING THRESHOLD AMPLITUDE: 0.5 V
MDC IDC MSMT LEADCHNL RA PACING THRESHOLD PULSEWIDTH: 0.4 ms
MDC IDC MSMT LEADCHNL RV IMPEDANCE VALUE: 416 Ohm
MDC IDC MSMT LEADCHNL RV PACING THRESHOLD AMPLITUDE: 1 V
MDC IDC SET LEADCHNL RV PACING PULSEWIDTH: 0.4 ms
MDC IDC STAT BRADY AP VS PERCENT: 99 %
MDC IDC STAT BRADY AS VP PERCENT: 0 %
MDC IDC STAT BRADY AS VS PERCENT: 0 %

## 2013-09-15 NOTE — Progress Notes (Signed)
PPM check in office. 

## 2013-09-23 ENCOUNTER — Encounter: Payer: Self-pay | Admitting: Cardiovascular Disease

## 2013-09-23 ENCOUNTER — Ambulatory Visit (INDEPENDENT_AMBULATORY_CARE_PROVIDER_SITE_OTHER): Payer: Medicare HMO | Admitting: Cardiovascular Disease

## 2013-09-23 VITALS — BP 132/62 | HR 64 | Ht 62.0 in | Wt 147.5 lb

## 2013-09-23 DIAGNOSIS — R609 Edema, unspecified: Secondary | ICD-10-CM

## 2013-09-23 DIAGNOSIS — I10 Essential (primary) hypertension: Secondary | ICD-10-CM

## 2013-09-23 DIAGNOSIS — I4891 Unspecified atrial fibrillation: Secondary | ICD-10-CM

## 2013-09-23 DIAGNOSIS — I509 Heart failure, unspecified: Secondary | ICD-10-CM

## 2013-09-23 DIAGNOSIS — Z950 Presence of cardiac pacemaker: Secondary | ICD-10-CM

## 2013-09-23 DIAGNOSIS — I5033 Acute on chronic diastolic (congestive) heart failure: Secondary | ICD-10-CM

## 2013-09-23 NOTE — Assessment & Plan Note (Signed)
Suggested that she continue on her current medication regimen. Typically always higher in the morning

## 2013-09-23 NOTE — Assessment & Plan Note (Signed)
Paced rhythm today. Followed by EP

## 2013-09-23 NOTE — Patient Instructions (Signed)
You are doing well. No medication changes were made.  Please call us if you have new issues that need to be addressed before your next appt.  Your physician wants you to follow-up in: 1 month You will receive a reminder letter in the mail two months in advance. If you don't receive a letter, please call our office to schedule the follow-up appointment.  

## 2013-09-23 NOTE — Progress Notes (Signed)
Patient ID: Kathryn Short, female    DOB: 20-Sep-1921, 80 y.o.   MRN: [id number]  HPI Comments: Kathryn Short is an 80 year old woman with paroxysmal atrial fibrillation (last episode in March 3016), diastolic CHF on a low-dose diuretic 3 days per week, hypertension,  sick sinus syndrome, pacemaker placement followed by Dr. Caryl Comes,  who presented to the hospital [facility] on 02/16/2012 in atrial fibrillation. She presents today for routine followup.  In followup today, blood pressure still elevated in the morning. Unsteady gait at baseline. No recent falls She is currently taking one half clonidine in the morning, at 5 PM, full clonidine pill at 10 PM with losartan 50 mg at bedtime This regimen seems to be working well for her history of falls with trauma requiring stitches.  No recent falls.She is using a cane, reluctant to use a walker. Patient and daughter are afraid of her fall risk, other complications from anticoagulation. Currently not on anticoagulation at their request. They are aware of risks and benefits associated with arrhythmia and stroke.  She takes torsemide one half pill 3 days per week for edema and shortness of breath.  She wears TED hose Weight has been relatively stable.  Previous lab work; Total cholesterol 189, LDL 104, hematocrit 38, creatinine 0.8  previous visits to the  emergency room with chest pain, dizziness, malaise. She had a problem on isosorbide in the past. Feelings of insomnia, and anxious feeling, chest pain episodes at night that wake her from sleep, weakness, nausea, dizziness. She has obstructive sleep apnea and does not use her BiPAP at nighttime  Echocardiogram in the hospital showed ejection fraction 50-55%, moderate LVH, mild to moderate TR, right ventricular systolic pressure 40 mm of mercury, mild to moderate aortic valve insufficiency  EKG shows paced rhythm 64 beats per minute    Outpatient Encounter Prescriptions as of 09/23/2013  Medication Sig  .  acetaminophen (TYLENOL) 500 MG tablet Takes 2-4 tablets daily.  Marland Kitchen amiodarone (PACERONE) 200 MG tablet Take 200 mg by mouth daily.   Marland Kitchen aspirin 81 MG tablet Take 162 mg by mouth daily.  Marland Kitchen b complex vitamins tablet Take 1 tablet by mouth daily.  . cloNIDine (CATAPRES) 0.1 MG tablet Take 1/2 tablet am, 1/2 at noon and 0.1 mg in the pm.  . dicyclomine (BENTYL) 10 MG capsule Take 10 mg by mouth daily. As needed  . docusate sodium (COLACE) 100 MG capsule Take 100 mg by mouth 2 (two) times daily.   Marland Kitchen guaiFENesin (MUCINEX) 600 MG 12 hr tablet Take 600 mg by mouth as needed.  Marland Kitchen HYDROcodone-acetaminophen (VICODIN) 5-500 MG per tablet Take 1/2 tablet daily.  Marland Kitchen levothyroxine (SYNTHROID, LEVOTHROID) 50 MCG tablet Take 50 mcg by mouth daily.   Marland Kitchen losartan (COZAAR) 100 MG tablet Take 1 tablet (100 mg total) by mouth daily.  . metoprolol (LOPRESSOR) 50 MG tablet Take 1 tablet (50 mg total) by mouth 2 (two) times daily.  . Multiple Vitamin (MULTIVITAMIN) tablet Take 1 tablet by mouth daily.  . nitrofurantoin, macrocrystal-monohydrate, (MACROBID) 100 MG capsule Take 100 mg by mouth 2 (two) times daily as needed.   . nitroGLYCERIN (NITROSTAT) 0.4 MG SL tablet Place 1 tablet (0.4 mg total) under the tongue every 5 (five) minutes as needed for chest pain.  . potassium chloride (K-DUR) 10 MEQ tablet Take 10 mEq by mouth 3 (three) times a week.  . Probiotic Product (PROBIOTIC DAILY PO) Take by mouth daily.  Marland Kitchen torsemide (DEMADEX) 20 MG tablet Take 1/2  tablet three times a week or as needed for increased swelling.    Review of Systems  Constitutional: Negative.        Frequent falls  HENT: Negative.   Eyes: Negative.   Respiratory: Negative.   Cardiovascular: Negative.   Gastrointestinal: Negative.   Endocrine: Negative.   Musculoskeletal: Positive for gait problem.  Skin: Negative.   Allergic/Immunologic: Negative.   Neurological: Negative.   Hematological: Negative.   Psychiatric/Behavioral: Negative.    All other systems reviewed and are negative.   BP 132/62  Pulse 64  Ht 5\' 2"  (1.575 m)  Wt 147 lb 8 oz (66.906 kg)  BMI 26.97 kg/m2  Physical Exam  Nursing note and vitals reviewed. Constitutional: She is oriented to person, place, and time. She appears well-developed and well-nourished.  HENT:  Head: Normocephalic.  Nose: Nose normal.  Mouth/Throat: Oropharynx is clear and moist.  Eyes: Conjunctivae are normal. Pupils are equal, round, and reactive to light.  Neck: Normal range of motion. Neck supple. No JVD present.  Cardiovascular: Normal rate, regular rhythm, S1 normal, S2 normal and intact distal pulses.  Exam reveals no gallop and no friction rub.   Murmur heard.  Systolic murmur is present with a grade of 2/6  Pulmonary/Chest: Effort normal and breath sounds normal. No respiratory distress. She has no wheezes. She has no rales. She exhibits no tenderness.  Abdominal: Soft. Bowel sounds are normal. She exhibits no distension. There is no tenderness.  Musculoskeletal: Normal range of motion. She exhibits no edema and no tenderness.  Lymphadenopathy:    She has no cervical adenopathy.  Neurological: She is alert and oriented to person, place, and time. Coordination normal.  Skin: Skin is warm and dry. No rash noted. No erythema.  Psychiatric: She has a normal mood and affect. Her behavior is normal. Judgment and thought content normal.    Assessment and Plan

## 2013-09-23 NOTE — Assessment & Plan Note (Signed)
Minimal, stable edema on today's visit. Continue torsemide several days per week

## 2013-09-23 NOTE — Assessment & Plan Note (Signed)
Doing well. Taking torsemide periodically. Weight is stable

## 2013-09-30 ENCOUNTER — Ambulatory Visit: Payer: Self-pay | Admitting: Internal Medicine

## 2013-10-27 ENCOUNTER — Ambulatory Visit (INDEPENDENT_AMBULATORY_CARE_PROVIDER_SITE_OTHER): Payer: Medicare HMO | Admitting: Cardiovascular Disease

## 2013-10-27 ENCOUNTER — Encounter: Payer: Self-pay | Admitting: Cardiovascular Disease

## 2013-10-27 VITALS — BP 98/56 | HR 61 | Wt 143.5 lb

## 2013-10-27 DIAGNOSIS — Z950 Presence of cardiac pacemaker: Secondary | ICD-10-CM

## 2013-10-27 DIAGNOSIS — I4891 Unspecified atrial fibrillation: Secondary | ICD-10-CM

## 2013-10-27 DIAGNOSIS — I509 Heart failure, unspecified: Secondary | ICD-10-CM

## 2013-10-27 DIAGNOSIS — I5033 Acute on chronic diastolic (congestive) heart failure: Secondary | ICD-10-CM

## 2013-10-27 DIAGNOSIS — I10 Essential (primary) hypertension: Secondary | ICD-10-CM

## 2013-10-27 MED ORDER — AMIODARONE HCL 200 MG PO TABS: 200.0000 mg | ORAL_TABLET | Freq: Every day | ORAL | Status: DC

## 2013-10-27 MED ORDER — POTASSIUM CHLORIDE ER 10 MEQ PO TBCR: 10.0000 meq | EXTENDED_RELEASE_TABLET | Freq: Every day | ORAL | Status: DC | PRN

## 2013-10-27 MED ORDER — METOPROLOL TARTRATE 50 MG PO TABS: 50.0000 mg | ORAL_TABLET | Freq: Two times a day (BID) | ORAL | Status: DC

## 2013-10-27 MED ORDER — TORSEMIDE 20 MG PO TABS: 20.0000 mg | ORAL_TABLET | Freq: Every day | ORAL | Status: DC | PRN

## 2013-10-27 MED ORDER — CLONIDINE HCL 0.1 MG PO TABS: 0.1000 mg | ORAL_TABLET | Freq: Three times a day (TID) | ORAL | Status: DC | PRN

## 2013-10-27 MED ORDER — LOSARTAN POTASSIUM 100 MG PO TABS: 100.0000 mg | ORAL_TABLET | Freq: Every day | ORAL | Status: DC

## 2013-10-27 NOTE — Assessment & Plan Note (Signed)
Recommended she take 1.5 mg of clonidine every night and continue the half dose clonidine in the morning and afternoon. Continue losartan and beta blocker

## 2013-10-27 NOTE — Assessment & Plan Note (Signed)
Appears relatively euvolemic. We'll continue low-dose torsemide several days per week as needed

## 2013-10-27 NOTE — Progress Notes (Signed)
Patient ID: Kathryn Short, female    DOB: 07-Dec-1921, 80 y.o.   MRN: [id number]  HPI Comments: Kathryn Short is an 80 year old woman with paroxysmal atrial fibrillation (last episode in March 7782), diastolic CHF on a low-dose diuretic 78 days per week, hypertension,  sick sinus syndrome, pacemaker placement followed by Dr. Caryl Comes,  who presented to the hospital [facility] on 02/16/2012 in atrial fibrillation. She presents today for routine followup.  In followup today, blood pressure still elevated in the morning. Unsteady gait at baseline. No recent falls She is currently taking one half clonidine in the morning, at 5 PM, full clonidine pill at 10 PM with losartan 50 mg at bedtime Despite this, systolic pressure typically 160 or higher in the morning, improved after half dose clonidine  history of falls with trauma requiring stitches.  She is  reluctant to use a cane or walker. Patient and daughter are afraid of her fall risk, other complications from anticoagulation. Currently not on anticoagulation at their request. They are aware of risks and benefits associated with arrhythmia and stroke.  She takes torsemide one half pill 3 days per week for edema and shortness of breath.  She wears TED hose Weight has been relatively stable.  Previous lab work; Total cholesterol 189, LDL 104, hematocrit 38, creatinine 0.8  previous visits to the  emergency room with chest pain, dizziness, malaise. She had a problem on isosorbide in the past. Feelings of insomnia, and anxious feeling, chest pain episodes at night that wake her from sleep, weakness, nausea, dizziness. She has obstructive sleep apnea and does not use her BiPAP at nighttime  Echocardiogram in the hospital showed ejection fraction 50-55%, moderate LVH, mild to moderate TR, right ventricular systolic pressure 40 mm of mercury, mild to moderate aortic valve insufficiency  EKG shows paced rhythm 61 beats per minute    Outpatient Encounter  Prescriptions as of 10/27/2013  Medication Sig  . acetaminophen (TYLENOL) 500 MG tablet Takes 2-4 tablets daily.  Marland Kitchen amiodarone (PACERONE) 200 MG tablet Take 1 tablet (200 mg total) by mouth daily.  Marland Kitchen aspirin 81 MG tablet Take 162 mg by mouth daily.  Marland Kitchen b complex vitamins tablet Take 1 tablet by mouth daily.  . cloNIDine (CATAPRES) 0.1 MG tablet Take 1 tablet (0.1 mg total) by mouth 3 (three) times daily as needed.  . dicyclomine (BENTYL) 10 MG capsule Take 10 mg by mouth daily. As needed  . docusate sodium (COLACE) 100 MG capsule Take 100 mg by mouth 2 (two) times daily.   Marland Kitchen guaiFENesin (MUCINEX) 600 MG 12 hr tablet Take 600 mg by mouth as needed.  Marland Kitchen HYDROcodone-acetaminophen (VICODIN) 5-500 MG per tablet Take 1/2 tablet daily.  Marland Kitchen levothyroxine (SYNTHROID, LEVOTHROID) 50 MCG tablet Take 50 mcg by mouth daily.   Marland Kitchen losartan (COZAAR) 100 MG tablet Take 1 tablet (100 mg total) by mouth daily.  . metoprolol (LOPRESSOR) 50 MG tablet Take 1 tablet (50 mg total) by mouth 2 (two) times daily.  . Multiple Vitamin (MULTIVITAMIN) tablet Take 1 tablet by mouth daily.  . nitrofurantoin, macrocrystal-monohydrate, (MACROBID) 100 MG capsule Take 100 mg by mouth 2 (two) times daily as needed.   . nitroGLYCERIN (NITROSTAT) 0.4 MG SL tablet Place 1 tablet (0.4 mg total) under the tongue every 5 (five) minutes as needed for chest pain.  . potassium chloride (K-DUR) 10 MEQ tablet Take 1 tablet (10 mEq total) by mouth daily as needed.  . Probiotic Product (PROBIOTIC DAILY PO) Take by mouth  daily.  . torsemide (DEMADEX) 20 MG tablet Take 1 tablet (20 mg total) by mouth daily as needed.    Review of Systems  Constitutional: Negative.   HENT: Negative.   Eyes: Negative.   Respiratory: Negative.   Cardiovascular: Negative.   Gastrointestinal: Negative.   Endocrine: Negative.   Musculoskeletal: Positive for gait problem.  Skin: Negative.   Allergic/Immunologic: Negative.   Neurological: Negative.    Hematological: Negative.   Psychiatric/Behavioral: Negative.   All other systems reviewed and are negative.   BP 98/56  Pulse 61  Wt 143 lb 8 oz (65.091 kg)  Physical Exam  Nursing note and vitals reviewed. Constitutional: She is oriented to person, place, and time. She appears well-developed and well-nourished.  HENT:  Head: Normocephalic.  Nose: Nose normal.  Mouth/Throat: Oropharynx is clear and moist.  Eyes: Conjunctivae are normal. Pupils are equal, round, and reactive to light.  Neck: Normal range of motion. Neck supple. No JVD present.  Cardiovascular: Normal rate, regular rhythm, S1 normal, S2 normal and intact distal pulses.  Exam reveals no gallop and no friction rub.   Murmur heard.  Systolic murmur is present with a grade of 2/6  Pulmonary/Chest: Effort normal and breath sounds normal. No respiratory distress. She has no wheezes. She has no rales. She exhibits no tenderness.  Abdominal: Soft. Bowel sounds are normal. She exhibits no distension. There is no tenderness.  Musculoskeletal: Normal range of motion. She exhibits no edema and no tenderness.  Lymphadenopathy:    She has no cervical adenopathy.  Neurological: She is alert and oriented to person, place, and time. Coordination normal.  Skin: Skin is warm and dry. No rash noted. No erythema.  Psychiatric: She has a normal mood and affect. Her behavior is normal. Judgment and thought content normal.    Assessment and Plan

## 2013-10-27 NOTE — Assessment & Plan Note (Signed)
Paced rhythm, rate relatively well-controlled

## 2013-10-27 NOTE — Assessment & Plan Note (Signed)
Followed by Dr. Klein 

## 2013-10-27 NOTE — Patient Instructions (Signed)
You are doing well.  Please take clonidine 1 1/2 pills at night (10 pm) 1/2 in the 9 Am  an 5 pm  Contiune losartan and metoprolol  Please call us if you have new issues that need to be addressed before your next appt.  Your physician wants you to follow-up in: 1 month.

## 2013-11-27 ENCOUNTER — Ambulatory Visit: Payer: Medicare HMO | Admitting: Cardiovascular Disease

## 2013-12-31 ENCOUNTER — Encounter: Payer: Self-pay | Admitting: Cardiovascular Disease

## 2013-12-31 ENCOUNTER — Ambulatory Visit (INDEPENDENT_AMBULATORY_CARE_PROVIDER_SITE_OTHER): Payer: Medicare HMO | Admitting: Cardiovascular Disease

## 2013-12-31 VITALS — BP 152/81 | HR 62 | Ht 62.0 in | Wt 147.5 lb

## 2013-12-31 DIAGNOSIS — R609 Edema, unspecified: Secondary | ICD-10-CM

## 2013-12-31 DIAGNOSIS — R42 Dizziness and giddiness: Secondary | ICD-10-CM

## 2013-12-31 DIAGNOSIS — I509 Heart failure, unspecified: Secondary | ICD-10-CM

## 2013-12-31 DIAGNOSIS — R2681 Unsteadiness on feet: Secondary | ICD-10-CM

## 2013-12-31 DIAGNOSIS — R269 Unspecified abnormalities of gait and mobility: Secondary | ICD-10-CM

## 2013-12-31 DIAGNOSIS — R351 Nocturia: Secondary | ICD-10-CM | POA: Insufficient documentation

## 2013-12-31 DIAGNOSIS — R531 Weakness: Secondary | ICD-10-CM

## 2013-12-31 DIAGNOSIS — I4891 Unspecified atrial fibrillation: Secondary | ICD-10-CM

## 2013-12-31 DIAGNOSIS — I5033 Acute on chronic diastolic (congestive) heart failure: Secondary | ICD-10-CM

## 2013-12-31 DIAGNOSIS — R5383 Other fatigue: Secondary | ICD-10-CM

## 2013-12-31 DIAGNOSIS — Z950 Presence of cardiac pacemaker: Secondary | ICD-10-CM

## 2013-12-31 DIAGNOSIS — R5381 Other malaise: Secondary | ICD-10-CM

## 2013-12-31 DIAGNOSIS — I10 Essential (primary) hypertension: Secondary | ICD-10-CM

## 2013-12-31 MED ORDER — TOLTERODINE TARTRATE 1 MG PO TABS: 1.0000 mg | ORAL_TABLET | Freq: Two times a day (BID) | ORAL | Status: DC

## 2013-12-31 NOTE — Progress Notes (Signed)
Patient ID: Kathryn Short, female    DOB: 12-21-1921, 80 y.o.   MRN: [id number]  HPI Comments: Ms. Prosser is an 80 year old woman with paroxysmal atrial fibrillation (last episode in March 9678), diastolic CHF on a low-dose diuretic 3 days per week, hypertension,  sick sinus syndrome, pacemaker placement followed by Dr. Caryl Comes,  who presented to the hospital [facility] on 02/16/2012 in atrial fibrillation. She presents today for routine followup.  Overall she feels well in followup today. Legs are very weak. She is high risk of falls. Daughter reports that she is needing assistance. She is reluctant to use a walker, still uses a cane.  Blood pressure has been high in the morning. It is not uncommon for systolic pressure 938-101 Blood pressure also running 150s to 160 in the evening. Blood pressure seems to be lower in the afternoon or early evening number 120s  No recent falls She is currently taking one half clonidine in the morning, at 5 PM, full clonidine pill at 10 PM with losartan 50 mg at bedtime  history of falls with trauma requiring stitches.  She is  reluctant to use a cane or walker. Patient and daughter are afraid of her fall risk, other complications from anticoagulation. Currently not on anticoagulation at their request. They are aware of risks and benefits associated with arrhythmia and stroke.  She takes torsemide one half pill 3 days per week for edema and shortness of breath.  She wears TED hose Weight has been relatively stable.  Previous lab work; Total cholesterol 189, LDL 104, hematocrit 38, creatinine 0.8  previous visits to the  emergency room with chest pain, dizziness, malaise. She had a problem on isosorbide in the past. Feelings of insomnia, and anxious feeling, chest pain episodes at night that wake her from sleep, weakness, nausea, dizziness. She has obstructive sleep apnea and does not use her BiPAP at nighttime  Echocardiogram in the hospital showed ejection  fraction 50-55%, moderate LVH, mild to moderate TR, right ventricular systolic pressure 40 mm of mercury, mild to moderate aortic valve insufficiency  EKG shows paced rhythm 62 beats per minute    Outpatient Encounter Prescriptions as of 12/31/2013  Medication Sig  . acetaminophen (TYLENOL) 500 MG tablet Takes 2-4 tablets daily.  Marland Kitchen amiodarone (PACERONE) 200 MG tablet Take 1 tablet (200 mg total) by mouth daily.  Marland Kitchen aspirin 81 MG tablet Take 162 mg by mouth daily.  Marland Kitchen b complex vitamins tablet Take 1 tablet by mouth daily.  . cloNIDine (CATAPRES) 0.1 MG tablet Take 1 tablet (0.1 mg total) by mouth 3 (three) times daily as needed.  . dicyclomine (BENTYL) 10 MG capsule Take 10 mg by mouth daily. As needed  . docusate sodium (COLACE) 100 MG capsule Take 100 mg by mouth 2 (two) times daily.   Marland Kitchen guaiFENesin (MUCINEX) 600 MG 12 hr tablet Take 600 mg by mouth as needed.  Marland Kitchen HYDROcodone-acetaminophen (VICODIN) 5-500 MG per tablet Take 1/2 tablet daily.  Marland Kitchen levothyroxine (SYNTHROID, LEVOTHROID) 50 MCG tablet Take 50 mcg by mouth daily.   Marland Kitchen losartan (COZAAR) 100 MG tablet Take 1 tablet (100 mg total) by mouth daily.  . metoprolol (LOPRESSOR) 50 MG tablet Take 1 tablet (50 mg total) by mouth 2 (two) times daily.  . Multiple Vitamin (MULTIVITAMIN) tablet Take 1 tablet by mouth daily.  . nitrofurantoin, macrocrystal-monohydrate, (MACROBID) 100 MG capsule Take 100 mg by mouth 2 (two) times daily as needed.   . nitroGLYCERIN (NITROSTAT) 0.4 MG SL tablet  Place 1 tablet (0.4 mg total) under the tongue every 5 (five) minutes as needed for chest pain.  . potassium chloride (K-DUR) 10 MEQ tablet Take 1 tablet (10 mEq total) by mouth daily as needed.  . Probiotic Product (PROBIOTIC DAILY PO) Take by mouth daily.  Marland Kitchen torsemide (DEMADEX) 20 MG tablet Take 1 tablet (20 mg total) by mouth daily as needed.    Review of Systems  Constitutional: Negative.   HENT: Negative.   Eyes: Negative.   Respiratory: Negative.    Cardiovascular: Negative.   Gastrointestinal: Negative.   Endocrine: Negative.   Musculoskeletal: Positive for gait problem.  Skin: Negative.   Allergic/Immunologic: Negative.   Neurological: Negative.   Hematological: Negative.   Psychiatric/Behavioral: Negative.   All other systems reviewed and are negative.   BP 152/81  Pulse 62  Ht 5\' 2"  (1.575 m)  Wt 147 lb 8 oz (66.906 kg)  BMI 26.97 kg/m2  Physical Exam  Nursing note and vitals reviewed. Constitutional: She is oriented to person, place, and time. She appears well-developed and well-nourished.  HENT:  Head: Normocephalic.  Nose: Nose normal.  Mouth/Throat: Oropharynx is clear and moist.  Eyes: Conjunctivae are normal. Pupils are equal, round, and reactive to light.  Neck: Normal range of motion. Neck supple. No JVD present.  Cardiovascular: Normal rate, regular rhythm, S1 normal, S2 normal and intact distal pulses.  Exam reveals no gallop and no friction rub.   Murmur heard.  Systolic murmur is present with a grade of 2/6  Pulmonary/Chest: Effort normal and breath sounds normal. No respiratory distress. She has no wheezes. She has no rales. She exhibits no tenderness.  Abdominal: Soft. Bowel sounds are normal. She exhibits no distension. There is no tenderness.  Musculoskeletal: Normal range of motion. She exhibits no edema and no tenderness.  Lymphadenopathy:    She has no cervical adenopathy.  Neurological: She is alert and oriented to person, place, and time. Coordination normal.  Skin: Skin is warm and dry. No rash noted. No erythema.  Psychiatric: She has a normal mood and affect. Her behavior is normal. Judgment and thought content normal.    Assessment and Plan

## 2013-12-31 NOTE — Patient Instructions (Addendum)
For blood pressure 150 or higher at 9 to 10 pm, Take 1 1/2 clonidine pills  Start detrol on pill at night for frequent urination  We will arrange PT and OT  Please call us if you have new issues that need to be addressed before your next appt.  Your physician wants you to follow-up in: 1 month.

## 2013-12-31 NOTE — Assessment & Plan Note (Signed)
Chronic venous insufficiency, wearing compression hose on today's visit.

## 2013-12-31 NOTE — Assessment & Plan Note (Signed)
She seems to be stable on torsemide 3 days per week

## 2013-12-31 NOTE — Assessment & Plan Note (Signed)
Leg weakness was a major issue discussed today. We will order PT, OT

## 2013-12-31 NOTE — Assessment & Plan Note (Signed)
Blood pressure continues to run high late in the evening, early in the morning. Suggested she increase the late evening clonidine up to 1-1/2 tabs

## 2013-12-31 NOTE — Assessment & Plan Note (Signed)
She reports getting up to go to the bathroom at least 6 times per night. Daughter is worried about her falling. She is requesting a medication to slow down her urination for safety reasons. We have suggested she try very low dose Detrol 1 mg in the evening, possibly starting with one half pill to start. This could be slowly titrated up.

## 2013-12-31 NOTE — Assessment & Plan Note (Signed)
Followed by Dr. Klein 

## 2014-01-01 ENCOUNTER — Telehealth: Payer: Self-pay | Admitting: *Deleted

## 2014-01-01 NOTE — Telephone Encounter (Signed)
Date:   Time:   BP:   HR: 12/30/2013  9:00 am  179/73   60    5:00 pm  115/53   61    9:45 pm  152/72   61 12/29/2013  8:00 am  180/81   65    6:15 pm  160/79   64    10:00 pm  152/79   72 12/28/2013  9:15 am  166/82   61    8:15 pm  146/66   59    10:00 pm  166/73   60 12/27/2013  8:15 am  171/80   60    7:30 pm  167/83   66    9:45 pm  144/67   61

## 2014-02-02 ENCOUNTER — Encounter: Payer: Self-pay | Admitting: Cardiovascular Disease

## 2014-02-02 ENCOUNTER — Encounter: Payer: Self-pay | Admitting: Internal Medicine

## 2014-02-02 ENCOUNTER — Ambulatory Visit (INDEPENDENT_AMBULATORY_CARE_PROVIDER_SITE_OTHER): Payer: Medicare HMO | Admitting: Internal Medicine

## 2014-02-02 ENCOUNTER — Ambulatory Visit (INDEPENDENT_AMBULATORY_CARE_PROVIDER_SITE_OTHER): Payer: Medicare HMO | Admitting: Cardiovascular Disease

## 2014-02-02 VITALS — BP 110/70 | HR 62 | Ht 62.0 in | Wt 148.0 lb

## 2014-02-02 VITALS — BP 97/59 | HR 62 | Ht 62.0 in | Wt 148.2 lb

## 2014-02-02 DIAGNOSIS — I10 Essential (primary) hypertension: Secondary | ICD-10-CM | POA: Diagnosis not present

## 2014-02-02 DIAGNOSIS — I480 Paroxysmal atrial fibrillation: Secondary | ICD-10-CM

## 2014-02-02 DIAGNOSIS — I4891 Unspecified atrial fibrillation: Secondary | ICD-10-CM

## 2014-02-02 DIAGNOSIS — I4819 Other persistent atrial fibrillation: Secondary | ICD-10-CM

## 2014-02-02 DIAGNOSIS — I5033 Acute on chronic diastolic (congestive) heart failure: Secondary | ICD-10-CM

## 2014-02-02 DIAGNOSIS — I509 Heart failure, unspecified: Secondary | ICD-10-CM

## 2014-02-02 LAB — MDC_IDC_ENUM_SESS_TYPE_INCLINIC
Battery Impedance: 1774 Ohm
Battery Remaining Longevity: 28 mo
Battery Voltage: 2.74 V
Brady Statistic AP VP Percent: 0 %
Brady Statistic AP VS Percent: 100 %
Date Time Interrogation Session: 20150825104322
Lead Channel Impedance Value: 403 Ohm
Lead Channel Pacing Threshold Amplitude: 0.75 V
Lead Channel Pacing Threshold Pulse Width: 0.4 ms
Lead Channel Setting Pacing Amplitude: 2 V
Lead Channel Setting Pacing Amplitude: 2.5 V
Lead Channel Setting Pacing Pulse Width: 0.4 ms
Lead Channel Setting Sensing Sensitivity: 2.8 mV
MDC IDC MSMT LEADCHNL RA IMPEDANCE VALUE: 368 Ohm
MDC IDC MSMT LEADCHNL RA PACING THRESHOLD PULSEWIDTH: 0.4 ms
MDC IDC MSMT LEADCHNL RV PACING THRESHOLD AMPLITUDE: 1 V
MDC IDC MSMT LEADCHNL RV SENSING INTR AMPL: 8 mV
MDC IDC STAT BRADY AS VP PERCENT: 0 %
MDC IDC STAT BRADY AS VS PERCENT: 0 %

## 2014-02-02 MED ORDER — GUAIFENESIN ER 600 MG PO TB12: 600.0000 mg | ORAL_TABLET | Freq: Two times a day (BID) | ORAL | Status: DC | PRN

## 2014-02-02 MED ORDER — AMIODARONE HCL 200 MG PO TABS: 100.0000 mg | ORAL_TABLET | Freq: Every day | ORAL | Status: DC

## 2014-02-02 NOTE — Progress Notes (Signed)
Patient Care Team: Tracie Harrier, MD as PCP - General (Internal Medicine)   HPI  Kathryn Short is a 80 y.o. female seen in followup for atrial fibrillation and bradycardia with prior pacemaker implantation.  She was previously on warfarin but because of falls and had been taking aspirin. When we met last year I encouraged her to use a walker and anticoagulation. She has been reluctant to give up her cane.  She has hypertension and orthostatic intolerance.  Her dizziness has been relatively stable and not disabling. Review of blood pressures over the last week demonstrated a range of 97--180 but mostly in the range of 130--160  She has mild peripheral edema and takes a diuretic a few times a week.  She has been no interval tachypalpitations.  2014 ultrasonography at [facility] >>ejection fraction 50-55%, moderate LVH, mild to moderate TR, right ventricular systolic pressure 40 mm of mercury, mild to moderate aortic valve insufficiency  She has no history of coronary disease. She recalls having had a stress test at [facility]; we are unable to find records.   She has been doing relatively well without shortness of breath. She has had problems with peripheral edema.     Past Medical History  Diagnosis Date  . Atrial fibrillation   . Hypertension   . OSA (obstructive sleep apnea)     On BiPAP  . GERD (gastroesophageal reflux disease)   . Hypothyroidism     Treated  . Uterine cancer     s/p radiation therapy in 1952  . Melanoma     Resected from back  . Fall     Past Surgical History  Procedure Laterality Date  . Total abdominal hysterectomy w/ bilateral salpingoophorectomy    . Colostomy      For perforation  . Cholecystectomy    . Cytoscopy    . Insert / replace / remove pacemaker      Implantation-Medtronic  . Skin biopsy      face    Current Outpatient Prescriptions  Medication Sig Dispense Refill  . acetaminophen (TYLENOL) 500 MG tablet Takes 2-4 tablets daily.      Marland Kitchen  amiodarone (PACERONE) 200 MG tablet Take 1 tablet (200 mg total) by mouth daily.  90 tablet  3  . aspirin 81 MG tablet Take 162 mg by mouth daily.      Marland Kitchen b complex vitamins tablet Take 1 tablet by mouth daily.      . cloNIDine (CATAPRES) 0.1 MG tablet Take 1 tablet (0.1 mg total) by mouth 3 (three) times daily as needed.  90 tablet  11  . dicyclomine (BENTYL) 10 MG capsule Take 10 mg by mouth daily. As needed      . docusate sodium (COLACE) 100 MG capsule Take 100 mg by mouth 2 (two) times daily.       Marland Kitchen guaiFENesin (MUCINEX) 600 MG 12 hr tablet Take 600 mg by mouth daily.       Marland Kitchen HYDROcodone-acetaminophen (VICODIN) 5-500 MG per tablet Take 1/2 tablet daily.      Marland Kitchen levothyroxine (SYNTHROID, LEVOTHROID) 50 MCG tablet Take 50 mcg by mouth daily.       Marland Kitchen losartan (COZAAR) 100 MG tablet Take 1 tablet (100 mg total) by mouth daily.  90 tablet  3  . metoprolol (LOPRESSOR) 50 MG tablet Take 1 tablet (50 mg total) by mouth 2 (two) times daily.  180 tablet  3  . Multiple Vitamin (MULTIVITAMIN) tablet Take 1 tablet by mouth daily.      Marland Kitchen  nitrofurantoin, macrocrystal-monohydrate, (MACROBID) 100 MG capsule Take 100 mg by mouth 2 (two) times daily as needed.       . nitroGLYCERIN (NITROSTAT) 0.4 MG SL tablet Place 1 tablet (0.4 mg total) under the tongue every 5 (five) minutes as needed for chest pain.  25 tablet  11  . potassium chloride (K-DUR) 10 MEQ tablet Take 1 tablet (10 mEq total) by mouth daily as needed.  30 tablet  6  . Probiotic Product (PROBIOTIC DAILY PO) Take by mouth daily.      Marland Kitchen torsemide (DEMADEX) 20 MG tablet Take 20 mg by mouth. Takes 1/2 tablet 3 times weekly.       No current facility-administered medications for this visit.    Allergies  Allergen Reactions  . Ace Inhibitors   . Ambien [Zolpidem Tartrate]   . Bactrim [Sulfamethoxazole-Trimethoprim]   . Digoxin And Related   . Hctz [Hydrochlorothiazide]   . Lunesta [Eszopiclone]     Review of Systems negative except from HPI  and PMH  Physical Exam BP 97/59  Pulse 62  Ht _0  (1.575 m)  Wt 148 lb 4 oz (67.246 kg)  BMI 27.11 kg/m2 Well developed and well nourished in no acute distress HENT normal E scleral and icterus clear Device pocket well healed; without hematoma or erythema.  There is no tethering Neck Supplethis Clear to ausculation Regular rate and rhythm, 2/6 murmur  Soft  No clubbing cyanosis trace  Edema Alert and oriented, grossly normal motor and sensory function Skin Warm and Dry    Assessment and  Plan  Hypertension  Atrial fibrillation  Sinus node dysfunction-symptomatic     Pacemaker-Medtronic The patient's device was interrogated.  The information was reviewed. No changes were made in the programming.    Orthostatic intolerance  There is no intercurrent atrial fibrillation. We will decrease her amiodarone from 200--100 mg daily. Surveillance for last was apparently done per patient report. However, reviewing care everywhere, and were not done and we will need to amiodarone surveillance laboratories.  I suggested that they raise the head of bed for his incisions to try to mitigate some of the nocturnal hypertension in a.m. orthostasis

## 2014-02-02 NOTE — Patient Instructions (Signed)
Your physician has recommended you make the following change in your medication:  Decrease Amiodarone to 100 mg (1/2 a tablet) once daily   Your physician wants you to follow-up in: Dr. Caryl Comes in 1 year. You will receive a reminder letter in the mail two months in advance. If you don't receive a letter, please call our office to schedule the follow-up appointment.  Your physician wants you to follow-up in: Device clinic in 6 months. You will receive a reminder letter in the mail two months in advance. If you don't receive a letter, please call our office to schedule the follow-up appointment.

## 2014-02-02 NOTE — Patient Instructions (Addendum)
You are doing well. No medication changes were made.  Please check the evening losartan dose Continue the extra 1/2 clonidine at night for high blood pressures  Please call us if you have new issues that need to be addressed before your next appt.  Your physician wants you to follow-up in: 1 month.

## 2014-02-02 NOTE — Progress Notes (Signed)
Patient ID: Kathryn Short, female    DOB: 1922-01-01, 80 y.o.   MRN: [id number]  HPI Comments: [name] is an 80 year old woman with paroxysmal atrial fibrillation (last episode in March 6195), diastolic CHF on a low-dose diuretic 3 days per week, labile hypertension,  sick sinus syndrome, pacemaker placement followed by Dr. Caryl Comes,  who presented to the hospital [facility] on 02/16/2012 in atrial fibrillation. She presents today for routine followup.  In followup today, she is doing well. She is in good spirits. Blood pressure continues to run high in the mornings at 9:00 when she wakes up. Systolic pressure typically 170 Daughter is giving 1-1/2 clonidine in the evening at 10 PM on he went systolic pressures over 093 otherwise she takes 1 clonidine Legs are very weak. She is high risk of falls. Daughter reports that she is needing assistance. She is reluctant to use a walker, still uses a cane.  No recent falls She is currently taking one half clonidine in the morning, at 5 PM, full clonidine pill at 10 PM with losartan 100 mg at bedtime, sometimes extra half clonidine before bed for high pressure  history of falls with trauma requiring stitches.  Patient and daughter are afraid of her fall risk, other complications from anticoagulation. Currently not on anticoagulation at their request. They are aware of risks and benefits associated with arrhythmia and stroke.  She takes torsemide one half pill 3 days per week for edema and shortness of breath.  She wears TED hose Weight has been relatively stable.  Previous lab work; Total cholesterol 189, LDL 104, hematocrit 38, creatinine 0.8  previous visits to the  emergency room with chest pain, dizziness, malaise. She had a problem on isosorbide in the past. Feelings of insomnia, and anxious feeling, chest pain episodes at night that wake her from sleep, weakness, nausea, dizziness. She has obstructive sleep apnea and does not use her BiPAP at  nighttime  Echocardiogram in the hospital showed ejection fraction 50-55%, moderate LVH, mild to moderate TR, right ventricular systolic pressure 40 mm of mercury, mild to moderate aortic valve insufficiency  EKG shows paced rhythm 62 beats per minute    Outpatient Encounter Prescriptions as of 02/02/2014  Medication Sig  . acetaminophen (TYLENOL) 500 MG tablet Takes 2-4 tablets daily.  Marland Kitchen amiodarone (PACERONE) 200 MG tablet Take 0.5 tablets (100 mg total) by mouth daily.  Marland Kitchen aspirin 81 MG tablet Take 162 mg by mouth daily.  Marland Kitchen b complex vitamins tablet Take 1 tablet by mouth daily.  . cloNIDine (CATAPRES) 0.1 MG tablet Take 1 tablet (0.1 mg total) by mouth 3 (three) times daily as needed.  . dicyclomine (BENTYL) 10 MG capsule Take 10 mg by mouth daily. As needed  . docusate sodium (COLACE) 100 MG capsule Take 100 mg by mouth 2 (two) times daily.   Marland Kitchen guaiFENesin (MUCINEX) 600 MG 12 hr tablet Take 600 mg by mouth daily.   Marland Kitchen HYDROcodone-acetaminophen (VICODIN) 5-500 MG per tablet Take 1/2 tablet daily.  Marland Kitchen levothyroxine (SYNTHROID, LEVOTHROID) 50 MCG tablet Take 50 mcg by mouth daily.   Marland Kitchen losartan (COZAAR) 100 MG tablet Take 1 tablet (100 mg total) by mouth daily.  . metoprolol (LOPRESSOR) 50 MG tablet Take 1 tablet (50 mg total) by mouth 2 (two) times daily.  . Multiple Vitamin (MULTIVITAMIN) tablet Take 1 tablet by mouth daily.  . nitrofurantoin, macrocrystal-monohydrate, (MACROBID) 100 MG capsule Take 100 mg by mouth 2 (two) times daily as needed.   Marland Kitchen  nitroGLYCERIN (NITROSTAT) 0.4 MG SL tablet Place 1 tablet (0.4 mg total) under the tongue every 5 (five) minutes as needed for chest pain.  . potassium chloride (K-DUR) 10 MEQ tablet Take 1 tablet (10 mEq total) by mouth daily as needed.  . Probiotic Product (PROBIOTIC DAILY PO) Take by mouth daily.  Marland Kitchen torsemide (DEMADEX) 20 MG tablet Take 20 mg by mouth. Takes 1/2 tablet 3 times weekly.   Review of Systems  Constitutional: Negative.   HENT:  Negative.   Eyes: Negative.   Respiratory: Negative.   Cardiovascular: Negative.   Gastrointestinal: Negative.   Endocrine: Negative.   Musculoskeletal: Positive for gait problem.  Skin: Negative.   Allergic/Immunologic: Negative.   Neurological: Negative.   Hematological: Negative.   Psychiatric/Behavioral: Negative.   All other systems reviewed and are negative.   BP 110/70  Pulse 62  Ht 5\' 2"  (1.575 m)  Wt 148 lb (67.132 kg)  BMI 27.06 kg/m2  Physical Exam  Nursing note and vitals reviewed. Constitutional: She is oriented to person, place, and time. She appears well-developed and well-nourished.  HENT:  Head: Normocephalic.  Nose: Nose normal.  Mouth/Throat: Oropharynx is clear and moist.  Eyes: Conjunctivae are normal. Pupils are equal, round, and reactive to light.  Neck: Normal range of motion. Neck supple. No JVD present.  Cardiovascular: Normal rate, regular rhythm, S1 normal, S2 normal and intact distal pulses.  Exam reveals no gallop and no friction rub.   Murmur heard.  Systolic murmur is present with a grade of 2/6  Pulmonary/Chest: Effort normal and breath sounds normal. No respiratory distress. She has no wheezes. She has no rales. She exhibits no tenderness.  Abdominal: Soft. Bowel sounds are normal. She exhibits no distension. There is no tenderness.  Musculoskeletal: Normal range of motion. She exhibits no edema and no tenderness.  Lymphadenopathy:    She has no cervical adenopathy.  Neurological: She is alert and oriented to person, place, and time. Coordination normal.  Skin: Skin is warm and dry. No rash noted. No erythema.  Psychiatric: She has a normal mood and affect. Her behavior is normal. Judgment and thought content normal.    Assessment and Plan

## 2014-02-02 NOTE — Assessment & Plan Note (Signed)
She seems to be stable on torsemide 3 days per week

## 2014-02-02 NOTE — Assessment & Plan Note (Addendum)
not on anticoagulation at this time per the family's choice. She is a high fall risk. Daughter aware of risk and benefit of anticoagulation

## 2014-02-02 NOTE — Assessment & Plan Note (Signed)
Continues to have high blood pressure in the mornings. Otherwise in the daytime blood pressure is well controlled. Encouraged her to continue on the extra half clonidine before bed

## 2014-02-05 ENCOUNTER — Ambulatory Visit: Payer: Medicare HMO | Admitting: Cardiovascular Disease

## 2014-03-08 ENCOUNTER — Ambulatory Visit (INDEPENDENT_AMBULATORY_CARE_PROVIDER_SITE_OTHER): Payer: Commercial Managed Care - HMO | Admitting: Cardiovascular Disease

## 2014-03-08 ENCOUNTER — Encounter: Payer: Self-pay | Admitting: Cardiovascular Disease

## 2014-03-08 VITALS — BP 140/60 | HR 62 | Ht 62.0 in | Wt 149.8 lb

## 2014-03-08 DIAGNOSIS — I509 Heart failure, unspecified: Secondary | ICD-10-CM

## 2014-03-08 DIAGNOSIS — R609 Edema, unspecified: Secondary | ICD-10-CM

## 2014-03-08 DIAGNOSIS — I5033 Acute on chronic diastolic (congestive) heart failure: Secondary | ICD-10-CM

## 2014-03-08 DIAGNOSIS — R296 Repeated falls: Secondary | ICD-10-CM

## 2014-03-08 DIAGNOSIS — I4891 Unspecified atrial fibrillation: Secondary | ICD-10-CM

## 2014-03-08 DIAGNOSIS — Z950 Presence of cardiac pacemaker: Secondary | ICD-10-CM

## 2014-03-08 DIAGNOSIS — Z9181 History of falling: Secondary | ICD-10-CM

## 2014-03-08 DIAGNOSIS — I10 Essential (primary) hypertension: Secondary | ICD-10-CM

## 2014-03-08 NOTE — Assessment & Plan Note (Signed)
Slightly worse lower extremity edema on today's visit. Likely venous insufficiency from sitting over the past 3-4 days

## 2014-03-08 NOTE — Assessment & Plan Note (Signed)
We have recommended she take extra Lasix if needed for persistent leg swelling. Blood pressure also higher, recent cough. Daughter will give extra doses if symptoms do not improve in the next day or so

## 2014-03-08 NOTE — Assessment & Plan Note (Signed)
Blood pressure is well controlled on today's visit. No changes made to the medications. 

## 2014-03-08 NOTE — Progress Notes (Signed)
Patient ID: Kathryn Short, female    DOB: 1922/04/10, 80 y.o.   MRN: [id number]  HPI Comments: Kathryn Short is an 80 year old woman with paroxysmal atrial fibrillation (last episode in March 0177), diastolic CHF on a low-dose diuretic 3 days per week, labile hypertension,  sick sinus syndrome, pacemaker placement followed by Dr. Caryl Comes,  who presented to the hospital [facility] on 02/16/2012 in atrial fibrillation. She presents today for routine followup.  In followup today, she is doing well. She is in good spirits. No recent falls It has range several days in a Road and she has been sitting most of the day. Worsening lower extremity swelling that she has not been moving. She does not wear compression hose. She's also not been sleeping well the past few days. With this, her blood pressure has been running higher. Blood pressure continues to run high in the mornings. On her last clinic visit, we suggested we increase the evening dose of clonidine. Daughter is doing this for systolic pressure greater than 150. Blood pressures in the afternoon are much better.  Legs are very weak. She is high risk of falls. Daughter reports that she is needing assistance. She is reluctant to use a walker, still uses a cane. Anticoagulation again discussed with the patient and her daughter. They are not interested. They preferred to stay on aspirin  She is currently taking one half clonidine in the morning, at 5 PM, full clonidine pill at 10 PM,  with losartan 100 mg at bedtime, sometimes extra half clonidine before bed for high pressure  history of falls with trauma requiring stitches.  Patient and daughter are afraid of her fall risk, other complications from anticoagulation. Currently not on anticoagulation at their request. They are aware of risks and benefits associated with arrhythmia and stroke.  She takes torsemide one half pill 3 days per week for edema and shortness of breath.  She wears TED hose Weight has been  relatively stable.  Previous lab work; Total cholesterol 189, LDL 104, hematocrit 38, creatinine 0.8  previous visits to the  emergency room with chest pain, dizziness, malaise. She had a problem on isosorbide in the past. Feelings of insomnia, and anxious feeling, chest pain episodes at night that wake her from sleep, weakness, nausea, dizziness. She has obstructive sleep apnea and does not use her BiPAP at nighttime  Echocardiogram in the hospital showed ejection fraction 50-55%, moderate LVH, mild to moderate TR, right ventricular systolic pressure 40 mm of mercury, mild to moderate aortic valve insufficiency  EKG shows paced rhythm 62 beats per minute    Outpatient Encounter Prescriptions as of 03/08/2014  Medication Sig  . acetaminophen (TYLENOL) 500 MG tablet Takes 2-4 tablets daily.  Marland Kitchen amiodarone (PACERONE) 200 MG tablet Take 0.5 tablets (100 mg total) by mouth daily.  Marland Kitchen aspirin 81 MG tablet Take 162 mg by mouth daily.  Marland Kitchen b complex vitamins tablet Take 1 tablet by mouth daily.  . cloNIDine (CATAPRES) 0.1 MG tablet Take 1 tablet (0.1 mg total) by mouth 3 (three) times daily as needed.  . dicyclomine (BENTYL) 10 MG capsule Take 10 mg by mouth daily. As needed  . docusate sodium (COLACE) 100 MG capsule Take 100 mg by mouth 2 (two) times daily.   Marland Kitchen guaiFENesin (MUCINEX) 600 MG 12 hr tablet Take 1 tablet (600 mg total) by mouth 2 (two) times daily as needed.  Marland Kitchen HYDROcodone-acetaminophen (VICODIN) 5-500 MG per tablet Take 1/2 tablet daily.  Marland Kitchen levothyroxine (SYNTHROID, LEVOTHROID)  50 MCG tablet Take 50 mcg by mouth daily.   Marland Kitchen losartan (COZAAR) 100 MG tablet Take 1 tablet (100 mg total) by mouth daily.  . metoprolol (LOPRESSOR) 50 MG tablet Take 1 tablet (50 mg total) by mouth 2 (two) times daily.  . Multiple Vitamin (MULTIVITAMIN) tablet Take 1 tablet by mouth daily.  . nitrofurantoin, macrocrystal-monohydrate, (MACROBID) 100 MG capsule Take 100 mg by mouth 2 (two) times daily as needed.    . nitroGLYCERIN (NITROSTAT) 0.4 MG SL tablet Place 1 tablet (0.4 mg total) under the tongue every 5 (five) minutes as needed for chest pain.  . potassium chloride (K-DUR) 10 MEQ tablet Take 1 tablet (10 mEq total) by mouth daily as needed.  . Probiotic Product (PROBIOTIC DAILY PO) Take by mouth daily.  Marland Kitchen torsemide (DEMADEX) 20 MG tablet Take 20 mg by mouth. Takes 1/2 tablet 3 times weekly.    Review of Systems  Constitutional: Negative.   HENT: Negative.   Eyes: Negative.   Respiratory: Negative.   Cardiovascular: Negative.   Gastrointestinal: Negative.   Endocrine: Negative.   Musculoskeletal: Positive for gait problem.  Skin: Negative.   Allergic/Immunologic: Negative.   Neurological: Negative.   Hematological: Negative.   Psychiatric/Behavioral: Negative.   All other systems reviewed and are negative.   BP 140/60  Pulse 62  Ht 5\' 2"  (1.575 m)  Wt 149 lb 12 oz (67.926 kg)  BMI 27.38 kg/m2  Physical Exam  Nursing note and vitals reviewed. Constitutional: She is oriented to person, place, and time. She appears well-developed and well-nourished.  HENT:  Head: Normocephalic.  Nose: Nose normal.  Mouth/Throat: Oropharynx is clear and moist.  Eyes: Conjunctivae are normal. Pupils are equal, round, and reactive to light.  Neck: Normal range of motion. Neck supple. No JVD present.  Cardiovascular: Normal rate, regular rhythm, S1 normal, S2 normal and intact distal pulses.  Exam reveals no gallop and no friction rub.   Murmur heard.  Systolic murmur is present with a grade of 2/6  Pulmonary/Chest: Effort normal and breath sounds normal. No respiratory distress. She has no wheezes. She has no rales. She exhibits no tenderness.  Abdominal: Soft. Bowel sounds are normal. She exhibits no distension. There is no tenderness.  Musculoskeletal: Normal range of motion. She exhibits no edema and no tenderness.  Lymphadenopathy:    She has no cervical adenopathy.  Neurological: She is  alert and oriented to person, place, and time. Coordination normal.  Skin: Skin is warm and dry. No rash noted. No erythema.  Psychiatric: She has a normal mood and affect. Her behavior is normal. Judgment and thought content normal.    Assessment and Plan

## 2014-03-08 NOTE — Assessment & Plan Note (Signed)
Followed by Dr. Klein 

## 2014-03-08 NOTE — Assessment & Plan Note (Signed)
She appears to be maintaining normal sinus rhythm. She does not want anticoagulation

## 2014-03-08 NOTE — Assessment & Plan Note (Signed)
No recent falls noted by the daughter. She is a high fall risk

## 2014-03-08 NOTE — Patient Instructions (Addendum)
You are doing well. No medication changes were made.  Please take more lasix as needed for leg swelling, cough, high blood pressure  Call if you would like some hydralazine to take as needed for high blood pressure (lasts 4 to 6 hours)  Please call us if you have new issues that need to be addressed before your next appt.  Your physician wants you to follow-up in: 1 month.

## 2014-03-11 ENCOUNTER — Ambulatory Visit (INDEPENDENT_AMBULATORY_CARE_PROVIDER_SITE_OTHER): Payer: Commercial Managed Care - HMO | Admitting: Cardiovascular Disease

## 2014-03-11 ENCOUNTER — Telehealth: Payer: Self-pay

## 2014-03-11 VITALS — BP 138/92 | HR 123 | Ht 63.0 in | Wt 147.2 lb

## 2014-03-11 DIAGNOSIS — R609 Edema, unspecified: Secondary | ICD-10-CM

## 2014-03-11 DIAGNOSIS — I471 Supraventricular tachycardia: Secondary | ICD-10-CM

## 2014-03-11 DIAGNOSIS — R079 Chest pain, unspecified: Secondary | ICD-10-CM

## 2014-03-11 DIAGNOSIS — Z950 Presence of cardiac pacemaker: Secondary | ICD-10-CM

## 2014-03-11 DIAGNOSIS — I5033 Acute on chronic diastolic (congestive) heart failure: Secondary | ICD-10-CM

## 2014-03-11 NOTE — Assessment & Plan Note (Signed)
She had symptoms last night, again this morning. We have recommended she take additional metoprolol 50 mg x1 now, additional amiodarone 200 mg x1. This evening would take her metoprolol 50 mg p.m. dose, hold the clonidine and losartan tonight. We will call her through the day to make sure that she converted back to a slow normal rhythm. When she is done as, we will arrange to have her pacemaker reprogrammed

## 2014-03-11 NOTE — Progress Notes (Signed)
Patient ID: Kathryn Short, female    DOB: 01-05-1922, 80 y.o.   MRN: [id number]  HPI Comments: Kathryn Short is an 80 year old woman with paroxysmal atrial fibrillation (last episode in March 5053), diastolic CHF on a low-dose diuretic 3 days per week, labile hypertension,  sick sinus syndrome, pacemaker placement followed by Dr. Caryl Comes,  who presented to the hospital [facility] on 02/16/2012 in atrial fibrillation. She presents today for urgent visit for tachycardia.  Daughter reports that on routine blood pressure measurements last night, she was noted to have heart rate 120 beats per minute. She gave her regular medications. Later in the evening, heart rate seem to improve. Heart rate elevated again this morning and she presents to clinic.  Evaluation, she is ventricularly paced with rate goes to 120 beats per minute Medtronic technician was called to interrogate her pacer This showed PAT with rate 110-120 beats per minute She was changed to DDIR with the plan to reprogram her back to MVP at a later date. Otherwise she feels fine with no symptoms of shortness of breath, lightheadedness or dizziness. Blood pressure in the office in the 976 systolic  history of falls with trauma requiring stitches.  Patient and daughter are afraid of her fall risk, other complications from anticoagulation. Currently not on anticoagulation at their request. They are aware of risks and benefits associated with arrhythmia and stroke.  She takes torsemide one half pill 3 days per week for edema and shortness of breath.  She wears TED hose Weight has been relatively stable.  Previous lab work; Total cholesterol 189, LDL 104, hematocrit 38, creatinine 0.8  previous visits to the  emergency room with chest pain, dizziness, malaise. She had a problem on isosorbide in the past. Feelings of insomnia, and anxious feeling, chest pain episodes at night that wake her from sleep, weakness, nausea, dizziness. She has obstructive  sleep apnea and does not use her BiPAP at nighttime  Echocardiogram in the hospital showed ejection fraction 50-55%, moderate LVH, mild to moderate TR, right ventricular systolic pressure 40 mm of mercury, mild to moderate aortic valve insufficiency  EKG with ventricular lead paced rhythm with rate 120 beats per minute  Outpatient Encounter Prescriptions as of 03/11/2014  Medication Sig  . acetaminophen (TYLENOL) 500 MG tablet Takes 2-4 tablets daily.  Marland Kitchen amiodarone (PACERONE) 200 MG tablet Take 0.5 tablets (100 mg total) by mouth daily.  Marland Kitchen aspirin 81 MG tablet Take 162 mg by mouth daily.  Marland Kitchen b complex vitamins tablet Take 1 tablet by mouth daily.  . cloNIDine (CATAPRES) 0.1 MG tablet Take 1 tablet (0.1 mg total) by mouth 3 (three) times daily as needed.  . dicyclomine (BENTYL) 10 MG capsule Take 10 mg by mouth daily. As needed  . docusate sodium (COLACE) 100 MG capsule Take 100 mg by mouth 2 (two) times daily.   Marland Kitchen guaiFENesin (MUCINEX) 600 MG 12 hr tablet Take 1 tablet (600 mg total) by mouth 2 (two) times daily as needed.  Marland Kitchen HYDROcodone-acetaminophen (VICODIN) 5-500 MG per tablet Take 1/2 tablet daily.  Marland Kitchen levothyroxine (SYNTHROID, LEVOTHROID) 50 MCG tablet Take 50 mcg by mouth daily.   Marland Kitchen losartan (COZAAR) 100 MG tablet Take 1 tablet (100 mg total) by mouth daily.  . metoprolol (LOPRESSOR) 50 MG tablet Take 1 tablet (50 mg total) by mouth 2 (two) times daily.  . Multiple Vitamin (MULTIVITAMIN) tablet Take 1 tablet by mouth daily.  . nitrofurantoin, macrocrystal-monohydrate, (MACROBID) 100 MG capsule Take 100 mg by mouth  2 (two) times daily as needed.   . nitroGLYCERIN (NITROSTAT) 0.4 MG SL tablet Place 1 tablet (0.4 mg total) under the tongue every 5 (five) minutes as needed for chest pain.  . potassium chloride (K-DUR) 10 MEQ tablet Take 1 tablet (10 mEq total) by mouth daily as needed.  . Probiotic Product (PROBIOTIC DAILY PO) Take by mouth daily.  Marland Kitchen torsemide (DEMADEX) 20 MG tablet Take 20  mg by mouth. Takes 1/2 tablet 3 times weekly.    Review of Systems  Constitutional: Negative.   HENT: Negative.   Eyes: Negative.   Respiratory: Negative.   Cardiovascular: Positive for palpitations.  Gastrointestinal: Negative.   Endocrine: Negative.   Musculoskeletal: Positive for gait problem.  Skin: Negative.   Allergic/Immunologic: Negative.   Neurological: Negative.   Hematological: Negative.   Psychiatric/Behavioral: Negative.   All other systems reviewed and are negative.   BP 138/92  Pulse 123  Ht 5\' 3"  (1.6 m)  Wt 147 lb 4 oz (66.792 kg)  BMI 26.09 kg/m2  Physical Exam  Nursing note and vitals reviewed. Constitutional: She is oriented to person, place, and time. She appears well-developed and well-nourished.  HENT:  Head: Normocephalic.  Nose: Nose normal.  Mouth/Throat: Oropharynx is clear and moist.  Eyes: Conjunctivae are normal. Pupils are equal, round, and reactive to light.  Neck: Normal range of motion. Neck supple. No JVD present.  Cardiovascular: Normal rate, regular rhythm, S1 normal, S2 normal and intact distal pulses.  Exam reveals no gallop and no friction rub.   Murmur heard.  Systolic murmur is present with a grade of 2/6  Pulmonary/Chest: Effort normal and breath sounds normal. No respiratory distress. She has no wheezes. She has no rales. She exhibits no tenderness.  Abdominal: Soft. Bowel sounds are normal. She exhibits no distension. There is no tenderness.  Musculoskeletal: Normal range of motion. She exhibits no edema and no tenderness.  Lymphadenopathy:    She has no cervical adenopathy.  Neurological: She is alert and oriented to person, place, and time. Coordination normal.  Skin: Skin is warm and dry. No rash noted. No erythema.  Psychiatric: She has a normal mood and affect. Her behavior is normal. Judgment and thought content normal.    Assessment and Plan

## 2014-03-11 NOTE — Assessment & Plan Note (Signed)
Pacemaker interrogated today. Report was evaluated, discussed with the [employer]tr[employer] technician. Appears to be PAT. Rate 110-120 beats per minute. Medication changes as above

## 2014-03-11 NOTE — Telephone Encounter (Signed)
Spoke w/ pt's daughter.  She reports that pt's HR is usually in the 50s, jumped to the 110-120 range last night around 10pm and has not come down.  She gave pt her metoprolol & amiodarone as scheduled, w/ extra 1/2 metoprolol around 3:30am.  Pt reported pain in the center of her chest last night, but this has resolved.  Spoke w/ Christell Faith, PA and advised her to bring pt in for EKG.  She is agreeable and will proceed to the office.

## 2014-03-11 NOTE — Assessment & Plan Note (Signed)
Edema is worse on today's visit. Recommended she take extra torsemide. Possibly exacerbated by underlying arrhythmia

## 2014-03-11 NOTE — Telephone Encounter (Signed)
Pt daughter called, states pt HR this morning is 121. BP 143/97. States this has been going on since 10 last night.

## 2014-03-11 NOTE — Assessment & Plan Note (Signed)
Daughter reports the patient has worsening lower extremity edema. Possible mild shortness of breath. We have recommended that she take extra torsemide daily for 3 days.

## 2014-03-11 NOTE — Telephone Encounter (Signed)
Spoke w/ pt's daughter. She reports pt's BP 115/64 HR 80. Reports pt feels better and is resting in her chair.  Advised her of Dr. Donivan Scull recommendations of: 1.  Hold clonidine tonight 2.  Hold losartan tonight 3.  Take metoprolol 50mg  tonight and in the am 4.  Continue to monitor BP and HR.   Call the office numbers start to climb or if pt becomes symptomatic.   She verbalizes understanding and will call back w/ any questions or concerns.

## 2014-03-11 NOTE — Patient Instructions (Signed)
  You are having atrial tachycardia, Please take an extra metoprolol and amiodarone today We will call you later today to check your hear rate  Please call us if you have new issues that need to be addressed before your next appt.  Your physician wants you to follow-up in: 1 month.

## 2014-03-12 ENCOUNTER — Telehealth: Payer: Self-pay

## 2014-03-12 ENCOUNTER — Encounter: Payer: Self-pay | Admitting: Internal Medicine

## 2014-03-12 NOTE — Telephone Encounter (Signed)
Left message for Kathryn Short that I received her message and am forwarding to Dr. Rockey Situ. Advised her that she is sched to see Nevin Bloodgood in the device clinic on 03/16/14 @ 12:15. Asked pt to call back w/ any questions or concerns.

## 2014-03-12 NOTE — Telephone Encounter (Signed)
Pt daughter called with readings: 9:30 a.m.today 165/106 HR 80 11:00 a.m. today 120/79 HR 79 2:15  p.m.today 152/102 HR 79

## 2014-03-14 NOTE — Telephone Encounter (Signed)
She can get back on her original blood pressure medication regimen as she was taking before the arrhythmia

## 2014-03-16 ENCOUNTER — Encounter: Payer: Self-pay | Admitting: Internal Medicine

## 2014-03-16 ENCOUNTER — Ambulatory Visit (INDEPENDENT_AMBULATORY_CARE_PROVIDER_SITE_OTHER): Payer: Commercial Managed Care - HMO | Admitting: *Deleted

## 2014-03-16 ENCOUNTER — Ambulatory Visit (INDEPENDENT_AMBULATORY_CARE_PROVIDER_SITE_OTHER): Payer: Commercial Managed Care - HMO | Admitting: Internal Medicine

## 2014-03-16 VITALS — HR 70

## 2014-03-16 DIAGNOSIS — Z45018 Encounter for adjustment and management of other part of cardiac pacemaker: Secondary | ICD-10-CM

## 2014-03-16 DIAGNOSIS — I471 Supraventricular tachycardia: Secondary | ICD-10-CM

## 2014-03-16 DIAGNOSIS — I4891 Unspecified atrial fibrillation: Secondary | ICD-10-CM

## 2014-03-16 LAB — MDC_IDC_ENUM_SESS_TYPE_INCLINIC
Battery Impedance: 1851 Ohm
Battery Remaining Longevity: 24 mo
Brady Statistic AP VP Percent: 83 %
Brady Statistic AP VS Percent: 0 %
Date Time Interrogation Session: 20151006125143
MDC IDC MSMT BATTERY VOLTAGE: 2.73 V
MDC IDC MSMT LEADCHNL RA IMPEDANCE VALUE: 365 Ohm
MDC IDC MSMT LEADCHNL RV IMPEDANCE VALUE: 407 Ohm
MDC IDC SET LEADCHNL RA PACING AMPLITUDE: 2 V
MDC IDC SET LEADCHNL RV PACING AMPLITUDE: 2.5 V
MDC IDC SET LEADCHNL RV PACING PULSEWIDTH: 0.4 ms
MDC IDC SET LEADCHNL RV SENSING SENSITIVITY: 2.8 mV
MDC IDC STAT BRADY AS VP PERCENT: 2 %
MDC IDC STAT BRADY AS VS PERCENT: 15 %

## 2014-03-16 NOTE — Progress Notes (Signed)
Patient Care Team: Tracie Harrier, MD as PCP - General (Internal Medicine)   HPI  Kathryn Short is a 80 y.o. female seen in followup for atrial fibrillation and bradycardia with prior pacemaker implantation.  She was previously on warfarin but because of falls and had been taking aspirin. When we met last year I encouraged her to use a walker and anticoagulation. She has been reluctant to give up her cane.  She was seen a few weeks ago with tracking of an atrial tach which was quite symptomatic  Her device ws reprogrammed DDI  She has had some intermittent afib but feels terrible--she is AV paced  She has hypertension and orthostatic intolerance.  Her dizziness has been relatively stable and not disabling. Review of blood pressures over the last week demonstrated a range of 97--180 but mostly in the range of 130--160  She has mild peripheral edema and takes a diuretic a few times a week.   2014 ultrasonography at [facility] >>ejection fraction 50-55%, moderate LVH, mild to moderate TR, right ventricular systolic pressure 40 mm of mercury, mild to moderate aortic valve insufficiency  She has no history of coronary disease. She recalls having had a stress test at [facility]; we are unable to find records.   She has been doing relatively well without shortness of breath. She has had problems with peripheral edema.     Past Medical History  Diagnosis Date  . Atrial fibrillation   . Hypertension   . OSA (obstructive sleep apnea)     On BiPAP  . GERD (gastroesophageal reflux disease)   . Hypothyroidism     Treated  . Uterine cancer     s/p radiation therapy in 1952  . Melanoma     Resected from back  . Fall     Past Surgical History  Procedure Laterality Date  . Total abdominal hysterectomy w/ bilateral salpingoophorectomy    . Colostomy      For perforation  . Cholecystectomy    . Cytoscopy    . Insert / replace / remove pacemaker      Implantation-Medtronic  . Skin biopsy     face    Current Outpatient Prescriptions  Medication Sig Dispense Refill  . acetaminophen (TYLENOL) 500 MG tablet Takes 2-4 tablets daily.      Marland Kitchen amiodarone (PACERONE) 200 MG tablet Take 0.5 tablets (100 mg total) by mouth daily.  90 tablet  3  . aspirin 81 MG tablet Take 162 mg by mouth daily.      Marland Kitchen b complex vitamins tablet Take 1 tablet by mouth daily.      . cloNIDine (CATAPRES) 0.1 MG tablet Take 1 tablet (0.1 mg total) by mouth 3 (three) times daily as needed.  90 tablet  11  . dicyclomine (BENTYL) 10 MG capsule Take 10 mg by mouth daily. As needed      . docusate sodium (COLACE) 100 MG capsule Take 100 mg by mouth 2 (two) times daily.       Marland Kitchen guaiFENesin (MUCINEX) 600 MG 12 hr tablet Take 1 tablet (600 mg total) by mouth 2 (two) times daily as needed.  60 tablet  6  . HYDROcodone-acetaminophen (VICODIN) 5-500 MG per tablet Take 1/2 tablet daily.      Marland Kitchen levothyroxine (SYNTHROID, LEVOTHROID) 50 MCG tablet Take 50 mcg by mouth daily.       Marland Kitchen losartan (COZAAR) 100 MG tablet Take 1 tablet (100 mg total) by mouth daily.  90 tablet  3  .  metoprolol (LOPRESSOR) 50 MG tablet Take 1 tablet (50 mg total) by mouth 2 (two) times daily.  180 tablet  3  . Multiple Vitamin (MULTIVITAMIN) tablet Take 1 tablet by mouth daily.      . nitrofurantoin, macrocrystal-monohydrate, (MACROBID) 100 MG capsule Take 100 mg by mouth 2 (two) times daily as needed.       . nitroGLYCERIN (NITROSTAT) 0.4 MG SL tablet Place 1 tablet (0.4 mg total) under the tongue every 5 (five) minutes as needed for chest pain.  25 tablet  11  . potassium chloride (K-DUR) 10 MEQ tablet Take 1 tablet (10 mEq total) by mouth daily as needed.  30 tablet  6  . Probiotic Product (PROBIOTIC DAILY PO) Take by mouth daily.      Marland Kitchen torsemide (DEMADEX) 20 MG tablet Take 20 mg by mouth. Takes 1/2 tablet 3 times weekly.       No current facility-administered medications for this visit.    Allergies  Allergen Reactions  . Ace Inhibitors   .  Ambien [Zolpidem Tartrate]   . Bactrim [Sulfamethoxazole-Trimethoprim]   . Digoxin And Related   . Hctz [Hydrochlorothiazide]   . Lunesta [Eszopiclone]     Review of Systems negative except from HPI and PMH  Physical Exam Pulse 42 Well developed and well nourished in no acute distress HENT normal [place],a none    Assessment and  Plan  Hypertension  Atrial fibrillation  Sinus node dysfunction-symptomatic     Atrial tach  Pacemaker-Medtronic The patient's device was interrogated.  The information was reviewed.    Orthostatic intolerance  There is no intercurrent atrial fibrillation. We will continue her no lower dose amio  i have reprggrammed the pacer back A>>D with upper tracking rate limited to 90

## 2014-03-16 NOTE — Progress Notes (Signed)
PPM check in office. 

## 2014-04-14 ENCOUNTER — Encounter: Payer: Self-pay | Admitting: Cardiovascular Disease

## 2014-04-14 ENCOUNTER — Ambulatory Visit (INDEPENDENT_AMBULATORY_CARE_PROVIDER_SITE_OTHER): Payer: Commercial Managed Care - HMO | Admitting: Cardiovascular Disease

## 2014-04-14 VITALS — BP 140/70 | HR 67 | Ht 62.0 in | Wt 150.0 lb

## 2014-04-14 DIAGNOSIS — I10 Essential (primary) hypertension: Secondary | ICD-10-CM

## 2014-04-14 DIAGNOSIS — I471 Supraventricular tachycardia: Secondary | ICD-10-CM

## 2014-04-14 DIAGNOSIS — R609 Edema, unspecified: Secondary | ICD-10-CM

## 2014-04-14 DIAGNOSIS — I4891 Unspecified atrial fibrillation: Secondary | ICD-10-CM

## 2014-04-14 NOTE — Assessment & Plan Note (Signed)
Blood pressure within her typical range. No medication changes made

## 2014-04-14 NOTE — Assessment & Plan Note (Addendum)
Recent atrial tachycardia, resolved with medical management, no further recurrent arrhythmia Recent Note from Dr. Caryl Comes reviewed

## 2014-04-14 NOTE — Progress Notes (Signed)
Patient ID: Kathryn Short, female    DOB: [birth year], 80 y.o.   MRN: [id number]  HPI Comments: Kathryn Short is an 80 year old woman with paroxysmal atrial fibrillation (last episode in March 1093), diastolic CHF on a low-dose diuretic 3 days per week, labile hypertension,  sick sinus syndrome, pacemaker placement followed by Dr. Caryl Comes,  who presented to the hospital [facility] on 02/16/2012 in atrial fibrillation. She presents today for routine followup.  Several weeks ago had atrial tachycardia and pacer was tracking the atrial rhythm,  Her device was reprogrammed in the office for DDI  She was continued on her beta blocker and amiodarone and rhythm broke, converting back to normal sinus rhythm. Pacemaker has been since changed back to her prior settings   in follow-up today,Blood pressures were elevated in early October, mobile recently have been well controlled around her systolic range 235-573 Heart rates in the 60s to 70.  No recent falls. Daughter reports her legs are getting weaker Also has trace leg edema on a regular basis. Typically blood pressure runs higher in the morning She takes torsemide one half pill 4 days per week for edema and shortness of breath.  She wears TED hose Weight has been relatively stable.   She is reluctant to use a walker, still uses a cane. Anticoagulation  discussed previouslywith the patient and her daughter. They are not interested. They preferred to stay on aspirin  She is currently taking one half clonidine in the morning, at 5 PM, full clonidine pill at 10 PM,  with losartan 100 mg at bedtime, sometimes extra half clonidine before bed for high pressure  Prior past medical history history of falls with trauma requiring stitches.  Patient and daughter are afraid of her fall risk, other complications from anticoagulation. Currently not on anticoagulation at their request. They are aware of risks and benefits associated with arrhythmia and stroke.  Previous lab  work; Total cholesterol 189, LDL 104, hematocrit 38, creatinine 0.8  previous visits to the  emergency room with chest pain, dizziness, malaise. She had a problem on isosorbide in the past. Feelings of insomnia, and anxious feeling, chest pain episodes at night that wake her from sleep, weakness, nausea, dizziness. She has obstructive sleep apnea and does not use her BiPAP at nighttime  Echocardiogram in the hospital showed ejection fraction 50-55%, moderate LVH, mild to moderate TR, right ventricular systolic pressure 40 mm of mercury, mild to moderate aortic valve insufficiency  EKG shows paced rhythm 67 beats per minute    Outpatient Encounter Prescriptions as of 04/14/2014  Medication Sig  . acetaminophen (TYLENOL) 500 MG tablet Takes 2-4 tablets daily.  Marland Kitchen amiodarone (PACERONE) 200 MG tablet Take 0.5 tablets (100 mg total) by mouth daily.  Marland Kitchen aspirin 81 MG tablet Take 162 mg by mouth daily.  Marland Kitchen b complex vitamins tablet Take 1 tablet by mouth daily.  . cloNIDine (CATAPRES) 0.1 MG tablet Take 1 tablet (0.1 mg total) by mouth 3 (three) times daily as needed.  . dicyclomine (BENTYL) 10 MG capsule Take 10 mg by mouth daily. As needed  . docusate sodium (COLACE) 100 MG capsule Take 100 mg by mouth 2 (two) times daily.   Marland Kitchen guaiFENesin (MUCINEX) 600 MG 12 hr tablet Take 1 tablet (600 mg total) by mouth 2 (two) times daily as needed.  Marland Kitchen HYDROcodone-acetaminophen (VICODIN) 5-500 MG per tablet Take 1/2 tablet daily.  Marland Kitchen levothyroxine (SYNTHROID, LEVOTHROID) 50 MCG tablet Take 50 mcg by mouth daily.   Marland Kitchen  losartan (COZAAR) 100 MG tablet Take 1 tablet (100 mg total) by mouth daily.  . metoprolol (LOPRESSOR) 50 MG tablet Take 1 tablet (50 mg total) by mouth 2 (two) times daily.  . Multiple Vitamin (MULTIVITAMIN) tablet Take 1 tablet by mouth daily.  . nitrofurantoin, macrocrystal-monohydrate, (MACROBID) 100 MG capsule Take 100 mg by mouth 2 (two) times daily as needed.   . nitroGLYCERIN (NITROSTAT) 0.4  MG SL tablet Place 1 tablet (0.4 mg total) under the tongue every 5 (five) minutes as needed for chest pain.  . potassium chloride (K-DUR) 10 MEQ tablet Take 1 tablet (10 mEq total) by mouth daily as needed.  . Probiotic Product (PROBIOTIC DAILY PO) Take by mouth daily.  Marland Kitchen torsemide (DEMADEX) 20 MG tablet Take 20 mg by mouth. Takes 1/2 tablet 4 times weekly.    Review of Systems  Respiratory: Negative.   Cardiovascular: Negative.   Gastrointestinal: Negative.   Musculoskeletal: Positive for gait problem.  Neurological: Positive for weakness.  All other systems reviewed and are negative.   BP 140/70 mmHg  Pulse 67  Ht 5\' 2"  (1.575 m)  Wt 150 lb (68.04 kg)  BMI 27.43 kg/m2  Physical Exam  Constitutional: She is oriented to person, place, and time. She appears well-developed and well-nourished.  HENT:  Head: Normocephalic.  Nose: Nose normal.  Mouth/Throat: Oropharynx is clear and moist.  Eyes: Conjunctivae are normal. Pupils are equal, round, and reactive to light.  Neck: Normal range of motion. Neck supple. No JVD present.  Cardiovascular: Normal rate, regular rhythm, S1 normal, S2 normal, normal heart sounds and intact distal pulses.  Exam reveals no gallop and no friction rub.   No murmur heard. Trace pitting edema to the shins bilaterally  Pulmonary/Chest: Effort normal and breath sounds normal. No respiratory distress. She has no wheezes. She has no rales. She exhibits no tenderness.  Abdominal: Soft. Bowel sounds are normal. She exhibits no distension. There is no tenderness.  Musculoskeletal: Normal range of motion. She exhibits no edema or tenderness.  Lymphadenopathy:    She has no cervical adenopathy.  Neurological: She is alert and oriented to person, place, and time. Coordination normal.  Skin: Skin is warm and dry. No rash noted. No erythema.  Psychiatric: She has a normal mood and affect. Her behavior is normal. Judgment and thought content normal.    Assessment  and Plan  Nursing note and vitals reviewed.

## 2014-04-14 NOTE — Patient Instructions (Addendum)
You are doing well.  Please take extra torsemide , consider 1/2 torsemide 5 days a week Alternate whole with 1/2 potassium  For tachycardia, Take an extra amiodarone or two with extra metoprolol  Please call us if you have new issues that need to be addressed before your next appt.  Your physician wants you to follow-up in: 1 month.

## 2014-04-14 NOTE — Assessment & Plan Note (Signed)
Maintaining normal sinus rhythm. We'll continue current medications

## 2014-04-14 NOTE — Assessment & Plan Note (Signed)
Trace pitting edema. Recommended that she increase the torsemide up to 10 mg on weekdays Recent BMP shows stable renal function and potassium

## 2014-05-04 ENCOUNTER — Encounter: Payer: Self-pay | Admitting: Internal Medicine

## 2014-05-04 ENCOUNTER — Ambulatory Visit (INDEPENDENT_AMBULATORY_CARE_PROVIDER_SITE_OTHER): Payer: Commercial Managed Care - HMO | Admitting: Internal Medicine

## 2014-05-04 VITALS — BP 115/67 | HR 63 | Ht 62.0 in | Wt 146.8 lb

## 2014-05-04 DIAGNOSIS — I495 Sick sinus syndrome: Secondary | ICD-10-CM

## 2014-05-04 LAB — MDC_IDC_ENUM_SESS_TYPE_INCLINIC
Battery Impedance: 2019 Ohm
Battery Remaining Longevity: 24 mo
Brady Statistic AS VP Percent: 0 %
Lead Channel Pacing Threshold Amplitude: 1 V
Lead Channel Sensing Intrinsic Amplitude: 8 mV
Lead Channel Setting Pacing Amplitude: 2.25 V
Lead Channel Setting Pacing Amplitude: 2.5 V
Lead Channel Setting Pacing Pulse Width: 0.4 ms
Lead Channel Setting Sensing Sensitivity: 2.8 mV
MDC IDC MSMT BATTERY VOLTAGE: 2.72 V
MDC IDC MSMT LEADCHNL RA IMPEDANCE VALUE: 388 Ohm
MDC IDC MSMT LEADCHNL RA PACING THRESHOLD AMPLITUDE: 1 V
MDC IDC MSMT LEADCHNL RA PACING THRESHOLD PULSEWIDTH: 0.4 ms
MDC IDC MSMT LEADCHNL RV IMPEDANCE VALUE: 430 Ohm
MDC IDC MSMT LEADCHNL RV PACING THRESHOLD PULSEWIDTH: 0.4 ms
MDC IDC SESS DTM: 20151124113853
MDC IDC STAT BRADY AP VP PERCENT: 0 %
MDC IDC STAT BRADY AP VS PERCENT: 100 %
MDC IDC STAT BRADY AS VS PERCENT: 0 %

## 2014-05-04 NOTE — Progress Notes (Signed)
Patient Care Team: Tracie Harrier, MD as PCP - General (Internal Medicine)   HPI  Kathryn Short is a 80 y.o. female seen in followup for atrial fibrillation and bradycardia with prior pacemaker implantation.  She was previously on warfarin but because of falls and had been taking aspirin. When we met last year I encouraged her to use a walker and anticoagulation. She has been reluctant to give up her cane.  She was seen a few weeks ago with tracking of an atrial tach which was quite symptomatic  Her device ws reprogrammed AAI>>DDD  She has hypertension    2014 ultrasonography at [facility] >>ejection fraction 50-55%, moderate LVH, mild to moderate TR, right ventricular systolic pressure 40 mm of mercury, mild to moderate aortic valve insufficiency  She has no history of coronary disease. She recalls having had a stress test at [facility]; we are unable to find records.   She has been doing relatively well without shortness of breath. She has had problems with peripheral edema.     Past Medical History  Diagnosis Date  . Atrial fibrillation   . Hypertension   . OSA (obstructive sleep apnea)     On BiPAP  . GERD (gastroesophageal reflux disease)   . Hypothyroidism     Treated  . Uterine cancer     s/p radiation therapy in 1952  . Melanoma     Resected from back  . Fall     Past Surgical History  Procedure Laterality Date  . Total abdominal hysterectomy w/ bilateral salpingoophorectomy    . Colostomy      For perforation  . Cholecystectomy    . Cytoscopy    . Insert / replace / remove pacemaker      Implantation-Medtronic  . Skin biopsy      face    Current Outpatient Prescriptions  Medication Sig Dispense Refill  . acetaminophen (TYLENOL) 500 MG tablet Takes 2-4 tablets daily.    Marland Kitchen amiodarone (PACERONE) 200 MG tablet Take 0.5 tablets (100 mg total) by mouth daily. 90 tablet 3  . aspirin 81 MG tablet Take 162 mg by mouth daily.    Marland Kitchen b complex vitamins tablet Take 1 tablet  by mouth daily.    . cloNIDine (CATAPRES) 0.1 MG tablet Take 1 tablet (0.1 mg total) by mouth 3 (three) times daily as needed. 90 tablet 11  . dicyclomine (BENTYL) 10 MG capsule Take 10 mg by mouth daily. As needed    . docusate sodium (COLACE) 100 MG capsule Take 100 mg by mouth 2 (two) times daily.     Marland Kitchen guaiFENesin (MUCINEX) 600 MG 12 hr tablet Take 1 tablet (600 mg total) by mouth 2 (two) times daily as needed. 60 tablet 6  . HYDROcodone-acetaminophen (VICODIN) 5-500 MG per tablet Take 1/2 tablet daily.    Marland Kitchen levothyroxine (SYNTHROID, LEVOTHROID) 50 MCG tablet Take 50 mcg by mouth daily.     Marland Kitchen losartan (COZAAR) 100 MG tablet Take 1 tablet (100 mg total) by mouth daily. 90 tablet 3  . metoprolol (LOPRESSOR) 50 MG tablet Take 1 tablet (50 mg total) by mouth 2 (two) times daily. 180 tablet 3  . Multiple Vitamin (MULTIVITAMIN) tablet Take 1 tablet by mouth daily.    . nitrofurantoin, macrocrystal-monohydrate, (MACROBID) 100 MG capsule Take 100 mg by mouth 2 (two) times daily as needed.     . nitroGLYCERIN (NITROSTAT) 0.4 MG SL tablet Place 1 tablet (0.4 mg total) under the tongue every 5 (five) minutes as  needed for chest pain. 25 tablet 11  . potassium chloride (K-DUR) 10 MEQ tablet Take 1 tablet (10 mEq total) by mouth daily as needed. 30 tablet 6  . Probiotic Product (PROBIOTIC DAILY PO) Take by mouth daily.    Marland Kitchen torsemide (DEMADEX) 20 MG tablet Take 20 mg by mouth. Takes 1/2 tablet 3 times weekly.     No current facility-administered medications for this visit.    Allergies  Allergen Reactions  . Ace Inhibitors   . Ambien [Zolpidem Tartrate]   . Bactrim [Sulfamethoxazole-Trimethoprim]   . Digoxin And Related   . Hctz [Hydrochlorothiazide]   . Lunesta [Eszopiclone]     Review of Systems negative except from HPI and PMH  Physical Exam BP 115/67 mmHg  Pulse 63  Ht $R'5\' 2"'kV$  (1.575 m)  Wt 66.565 kg (146 lb 12 oz)  BMI 26.83 kg/m2 Well developed and well nourished in no acute  distress HENT normal [place],a none    Assessment and  Plan  Hypertension  Atrial fibrillation  Sinus node dysfunction-symptomatic     Atrial tach  Pacemaker-Medtronic The patient's device was interrogated.  The information was reviewed.    Orthostatic intolerance  There is no intercurrent atrial fibrillation. We will continue her on  lower dose amio

## 2014-05-04 NOTE — Patient Instructions (Signed)
No changes made today. Follow up with Dr. Caryl Comes in 12 months.

## 2014-05-17 ENCOUNTER — Encounter: Payer: Self-pay | Admitting: Cardiovascular Disease

## 2014-05-17 ENCOUNTER — Ambulatory Visit (INDEPENDENT_AMBULATORY_CARE_PROVIDER_SITE_OTHER): Payer: Commercial Managed Care - HMO | Admitting: Cardiovascular Disease

## 2014-05-17 VITALS — BP 140/68 | HR 66 | Ht 62.0 in | Wt 152.5 lb

## 2014-05-17 DIAGNOSIS — I4891 Unspecified atrial fibrillation: Secondary | ICD-10-CM

## 2014-05-17 DIAGNOSIS — Z950 Presence of cardiac pacemaker: Secondary | ICD-10-CM

## 2014-05-17 DIAGNOSIS — I10 Essential (primary) hypertension: Secondary | ICD-10-CM

## 2014-05-17 DIAGNOSIS — R296 Repeated falls: Secondary | ICD-10-CM

## 2014-05-17 DIAGNOSIS — I5033 Acute on chronic diastolic (congestive) heart failure: Secondary | ICD-10-CM

## 2014-05-17 DIAGNOSIS — I471 Supraventricular tachycardia: Secondary | ICD-10-CM

## 2014-05-17 DIAGNOSIS — R609 Edema, unspecified: Secondary | ICD-10-CM

## 2014-05-17 NOTE — Assessment & Plan Note (Signed)
Amiodarone dose recently decreased by EP. Appears to maintaining normal sinus rhythm

## 2014-05-17 NOTE — Assessment & Plan Note (Signed)
Followed by Dr. Klein 

## 2014-05-17 NOTE — Patient Instructions (Addendum)
Weight today at home is probably 149 lbs For weight >146, take a full torsemide with full potassium For weight 143 to 145, take a 1/2 torsemide with 1/2 potassium For weight 140 to 142, take torsemide 1/2 four times a week  Call if you would like to check lab work in 2 weeks or so  Please call us if you have new issues that need to be addressed before your next appt.  Your physician wants you to follow-up in: 1 month.

## 2014-05-17 NOTE — Assessment & Plan Note (Signed)
No significant changes made to the medications. If blood pressure runs low in the late afternoon, may need to hold one of the clonidine tabs  She'll monitor this for now

## 2014-05-17 NOTE — Assessment & Plan Note (Signed)
No recent falls. Walks with assistance, a cane, does not want a walker

## 2014-05-17 NOTE — Assessment & Plan Note (Signed)
Edema consistent with acute on chronic diastolic CHF. Diuretic regimen as outlined above

## 2014-05-17 NOTE — Progress Notes (Signed)
Patient ID: Kathryn Short, female    DOB: 01/01/1922, 80 y.o.   MRN: [id number]  HPI Comments:  Kathryn Short is an 80 year old woman with paroxysmal atrial fibrillation (last episode in March 0086), diastolic CHF on a low-dose diuretic 4 days per week, labile hypertension,  sick sinus syndrome, pacemaker placement followed by Dr. Caryl Comes,  who presented to the hospital [facility] on 02/16/2012 in atrial fibrillation. She presents today for routine followup for her blood pressure and weight gain him a leg edema.  In follow-up today, she reports that her weight is up 3 pounds from her prior clinic visit, legs are more swollen. She continues to take torsemide 10 mg 4 days per week. She drinks significant coffee and water daily. Typically 3 cups of coffee per day and lots of water Legs feel tight, some cough. Since May, weight is up 10 pounds, typically 1 pound per month, 2.8 pounds in the past month. She denies any significant chest pain or tachycardia. No symptoms concerning for recurrent arrhythmia Amiodarone dose recently decreased by Dr. Caryl Comes Daughter reports that blood pressure has been low in the late afternoon She is currently taking one half clonidine in the morning, at 5 PM, full clonidine pill at 10 PM,  with losartan 100 mg at bedtime, sometimes extra half clonidine before bed for high pressure  EKG shows normal sinus rhythm with rate 66 bpm, first-degree AV block  Other past medical history Earlier in 2015, she had atrial tachycardia and pacer was tracking the atrial rhythm,  Her device was reprogrammed in the office for DDI  She was continued on her beta blocker and amiodarone and rhythm broke, converting back to normal sinus rhythm. Pacemaker has been since changed back to her prior settings   She is reluctant to use a walker, still uses a cane. Anticoagulation  discussed previouslywith the patient and her daughter. They are not interested. They preferred to stay on aspirin  history of  falls with trauma requiring stitches. None recently Patient and daughter are afraid of her fall risk, other complications from anticoagulation. Currently not on anticoagulation at their request. They are aware of risks and benefits associated with arrhythmia and stroke.  Previous lab work; Total cholesterol 189, LDL 104, hematocrit 38, creatinine 0.8  previous visits to the  emergency room with chest pain, dizziness, malaise. She had a problem on isosorbide in the past. Feelings of insomnia, and anxious feeling, chest pain episodes at night that wake her from sleep, weakness, nausea, dizziness. She has obstructive sleep apnea and does not use her BiPAP at nighttime  Echocardiogram in the hospital showed ejection fraction 50-55%, moderate LVH, mild to moderate TR, right ventricular systolic pressure 40 mm of mercury, mild to moderate aortic valve insufficiency   Outpatient Encounter Prescriptions as of 05/17/2014  Medication Sig  . acetaminophen (TYLENOL) 500 MG tablet Takes 2-4 tablets daily.  Marland Kitchen amiodarone (PACERONE) 200 MG tablet Take 0.5 tablets (100 mg total) by mouth daily.  Marland Kitchen aspirin 81 MG tablet Take 162 mg by mouth daily.  Marland Kitchen b complex vitamins tablet Take 1 tablet by mouth daily.  . cloNIDine (CATAPRES) 0.1 MG tablet Take 1 tablet (0.1 mg total) by mouth 3 (three) times daily as needed.  . dicyclomine (BENTYL) 10 MG capsule Take 10 mg by mouth daily. As needed  . docusate sodium (COLACE) 100 MG capsule Take 100 mg by mouth 2 (two) times daily.   Marland Kitchen guaiFENesin (MUCINEX) 600 MG 12 hr tablet Take 1 tablet (  600 mg total) by mouth 2 (two) times daily as needed.  Marland Kitchen HYDROcodone-acetaminophen (VICODIN) 5-500 MG per tablet Take 1/2 tablet daily.  Marland Kitchen levothyroxine (SYNTHROID, LEVOTHROID) 50 MCG tablet Take 50 mcg by mouth daily.   Marland Kitchen losartan (COZAAR) 100 MG tablet Take 1 tablet (100 mg total) by mouth daily.  . metoprolol (LOPRESSOR) 50 MG tablet Take 1 tablet (50 mg total) by mouth 2 (two) times  daily.  . Multiple Vitamin (MULTIVITAMIN) tablet Take 1 tablet by mouth daily.  . nitrofurantoin, macrocrystal-monohydrate, (MACROBID) 100 MG capsule Take 100 mg by mouth 2 (two) times daily as needed.   . nitroGLYCERIN (NITROSTAT) 0.4 MG SL tablet Place 1 tablet (0.4 mg total) under the tongue every 5 (five) minutes as needed for chest pain.  . Probiotic Product (PROBIOTIC DAILY PO) Take by mouth daily.  Marland Kitchen torsemide (DEMADEX) 20 MG tablet Take 20 mg by mouth. Takes 1/2 tablet 4 times weekly.  . potassium chloride (K-DUR) 10 MEQ tablet Take 1 tablet alternate with 1/2 tablet.  . [DISCONTINUED] potassium chloride (K-DUR) 10 MEQ tablet Take 1 tablet (10 mEq total) by mouth daily as needed. (Patient not taking: Reported on 05/17/2014)   Social history  reports that she has never smoked. She does not have any smokeless tobacco history on file. She reports that she does not drink alcohol or use illicit drugs.  Review of Systems  Respiratory: Negative.   Cardiovascular: Positive for leg swelling.  Gastrointestinal: Negative.   Musculoskeletal: Positive for gait problem.  Neurological: Positive for weakness.  All other systems reviewed and are negative.   BP 140/68 mmHg  Pulse 66  Ht 5\' 2"  (1.575 m)  Wt 152 lb 8 oz (69.174 kg)  BMI 27.89 kg/m2  Physical Exam  Constitutional: She is oriented to person, place, and time. She appears well-developed and well-nourished.  HENT:  Head: Normocephalic.  Nose: Nose normal.  Mouth/Throat: Oropharynx is clear and moist.  Eyes: Conjunctivae are normal. Pupils are equal, round, and reactive to light.  Neck: Normal range of motion. Neck supple. No JVD present.  Cardiovascular: Normal rate, regular rhythm, S1 normal, S2 normal, normal heart sounds and intact distal pulses.  Exam reveals no gallop and no friction rub.   No murmur heard. 1+ pitting edema to below the knees  Pulmonary/Chest: Effort normal and breath sounds normal. No respiratory distress.  She has no wheezes. She has no rales. She exhibits no tenderness.  Abdominal: Soft. Bowel sounds are normal. She exhibits no distension. There is no tenderness.  Musculoskeletal: Normal range of motion. She exhibits edema. She exhibits no tenderness.  Lymphadenopathy:    She has no cervical adenopathy.  Neurological: She is alert and oriented to person, place, and time. Coordination normal.  Skin: Skin is warm and dry. No rash noted. No erythema.  Psychiatric: She has a normal mood and affect. Her behavior is normal. Judgment and thought content normal.    Assessment and Plan  Nursing note and vitals reviewed.

## 2014-05-17 NOTE — Assessment & Plan Note (Addendum)
Weight is up 10 pounds from May 2015. Recommended she follow the regimen below Weight today at home is probably 149 lbs, weight in the office is 152.8 pounds For weight >146, take a full torsemide with full potassium For weight 143 to 145, take a 1/2 torsemide with 1/2 potassium For weight 140 to 142, take torsemide 1/2 four times a week  Order placed for lab work and a proximally 2 weeks if she has 4-5 pound weight loss on the regimen above

## 2014-05-25 ENCOUNTER — Inpatient Hospital Stay: Payer: Self-pay | Admitting: Surgery

## 2014-05-25 DIAGNOSIS — I5033 Acute on chronic diastolic (congestive) heart failure: Secondary | ICD-10-CM

## 2014-05-25 DIAGNOSIS — I4891 Unspecified atrial fibrillation: Secondary | ICD-10-CM

## 2014-05-25 DIAGNOSIS — I351 Nonrheumatic aortic (valve) insufficiency: Secondary | ICD-10-CM

## 2014-05-25 DIAGNOSIS — I10 Essential (primary) hypertension: Secondary | ICD-10-CM

## 2014-05-25 DIAGNOSIS — R609 Edema, unspecified: Secondary | ICD-10-CM

## 2014-05-25 LAB — BASIC METABOLIC PANEL
Anion Gap: 9 (ref 7–16)
BUN: 17 mg/dL (ref 7–18)
CALCIUM: 9.1 mg/dL (ref 8.5–10.1)
CHLORIDE: 97 mmol/L — AB (ref 98–107)
Co2: 29 mmol/L (ref 21–32)
Creatinine: 1.08 mg/dL (ref 0.60–1.30)
EGFR (African American): >60
EGFR (Non-African Amer.): 51 — ABNORMAL LOW
GLUCOSE: 159 mg/dL — AB (ref 65–99)
Osmolality: 275 (ref 275–301)
POTASSIUM: 4.7 mmol/L (ref 3.5–5.1)
Sodium: 135 mmol/L — ABNORMAL LOW (ref 136–145)

## 2014-05-25 LAB — PROTIME-INR
INR: 1
Prothrombin Time: 12.7 secs (ref 11.5–14.7)

## 2014-05-25 LAB — CBC
HCT: 40.6 % (ref 35.0–47.0)
HGB: 13 g/dL (ref 12.0–16.0)
MCH: 28.8 pg (ref 26.0–34.0)
MCHC: 31.9 g/dL — ABNORMAL LOW (ref 32.0–36.0)
MCV: 90 fL (ref 80–100)
Platelet: 233 10*3/uL (ref 150–440)
RBC: 4.51 10*6/uL (ref 3.80–5.20)
RDW: 15.4 % — AB (ref 11.5–14.5)
WBC: 12.2 10*3/uL — ABNORMAL HIGH (ref 3.6–11.0)

## 2014-05-25 LAB — TROPONIN I
Troponin-I: 0.08 ng/mL — ABNORMAL HIGH
Troponin-I: 0.11 ng/mL — ABNORMAL HIGH
Troponin-I: 0.12 ng/mL — ABNORMAL HIGH
Troponin-I: 0.09 ng/mL — ABNORMAL HIGH
Troponin-I: 0.11 ng/mL — ABNORMAL HIGH

## 2014-05-25 LAB — URINALYSIS, COMPLETE
BILIRUBIN, UR: NEGATIVE
Bacteria: NONE SEEN
Blood: NEGATIVE
GLUCOSE, UR: NEGATIVE mg/dL (ref 0–75)
KETONE: NEGATIVE
NITRITE: NEGATIVE
Ph: 7 (ref 4.5–8.0)
Protein: NEGATIVE
SPECIFIC GRAVITY: 1.018 (ref 1.003–1.030)

## 2014-05-25 LAB — PRO B NATRIURETIC PEPTIDE: B-TYPE NATIURETIC PEPTID: 1178 pg/mL — AB (ref 0–450)

## 2014-05-25 LAB — CK TOTAL AND CKMB (NOT AT ARMC)
CK, Total: 30 U/L (ref 26–192)
CK-MB: 0.7 ng/mL (ref 0.5–3.6)

## 2014-05-26 ENCOUNTER — Encounter: Payer: Self-pay | Admitting: Cardiovascular Disease

## 2014-05-26 DIAGNOSIS — I213 ST elevation (STEMI) myocardial infarction of unspecified site: Secondary | ICD-10-CM

## 2014-05-26 LAB — BASIC METABOLIC PANEL
ANION GAP: 5 — AB (ref 7–16)
BUN: 11 mg/dL (ref 7–18)
CHLORIDE: 104 mmol/L (ref 98–107)
CO2: 27 mmol/L (ref 21–32)
CREATININE: 0.89 mg/dL (ref 0.60–1.30)
Calcium, Total: 8.1 mg/dL — ABNORMAL LOW (ref 8.5–10.1)
EGFR (African American): >60
EGFR (Non-African Amer.): >60
GLUCOSE: 119 mg/dL — AB (ref 65–99)
Osmolality: 272 (ref 275–301)
Potassium: 4.1 mmol/L (ref 3.5–5.1)
SODIUM: 136 mmol/L (ref 136–145)

## 2014-05-26 LAB — CBC WITH DIFFERENTIAL/PLATELET
BASOS ABS: 0.1 10*3/uL (ref 0.0–0.1)
Basophil %: 0.9 %
Eosinophil #: 0.3 10*3/uL (ref 0.0–0.7)
Eosinophil %: 4.1 %
HCT: 34.5 % — AB (ref 35.0–47.0)
HGB: 11.2 g/dL — ABNORMAL LOW (ref 12.0–16.0)
Lymphocyte #: 2.3 10*3/uL (ref 1.0–3.6)
Lymphocyte %: 29.8 %
MCH: 29.3 pg (ref 26.0–34.0)
MCHC: 32.5 g/dL (ref 32.0–36.0)
MCV: 90 fL (ref 80–100)
Monocyte #: 0.5 x10 3/mm (ref 0.2–0.9)
Monocyte %: 6.8 %
NEUTROS ABS: 4.5 10*3/uL (ref 1.4–6.5)
NEUTROS PCT: 58.4 %
PLATELETS: 217 10*3/uL (ref 150–440)
RBC: 3.82 10*6/uL (ref 3.80–5.20)
RDW: 15.4 % — ABNORMAL HIGH (ref 11.5–14.5)
WBC: 7.7 10*3/uL (ref 3.6–11.0)

## 2014-05-27 LAB — URINALYSIS, COMPLETE
Bilirubin,UR: NEGATIVE
GLUCOSE, UR: NEGATIVE mg/dL (ref 0–75)
KETONE: NEGATIVE
Nitrite: NEGATIVE
Ph: 7 (ref 4.5–8.0)
Protein: NEGATIVE
RBC,UR: 15 /HPF (ref 0–5)
SPECIFIC GRAVITY: 1.008 (ref 1.003–1.030)
Squamous Epithelial: 2
WBC UR: 1447 /HPF (ref 0–5)

## 2014-05-27 LAB — BASIC METABOLIC PANEL
Anion Gap: 8 (ref 7–16)
BUN: 6 mg/dL — AB (ref 7–18)
CHLORIDE: 104 mmol/L (ref 98–107)
Calcium, Total: 8.2 mg/dL — ABNORMAL LOW (ref 8.5–10.1)
Co2: 23 mmol/L (ref 21–32)
Creatinine: 0.77 mg/dL (ref 0.60–1.30)
EGFR (Non-African Amer.): >60
GLUCOSE: 139 mg/dL — AB (ref 65–99)
Osmolality: 270 (ref 275–301)
POTASSIUM: 3.5 mmol/L (ref 3.5–5.1)
SODIUM: 135 mmol/L — AB (ref 136–145)

## 2014-05-27 LAB — CBC WITH DIFFERENTIAL/PLATELET
BASOS ABS: 0.1 10*3/uL (ref 0.0–0.1)
Basophil %: 1.2 %
EOS PCT: 1.4 %
Eosinophil #: 0.1 10*3/uL (ref 0.0–0.7)
HCT: 36.6 % (ref 35.0–47.0)
HGB: 11.8 g/dL — ABNORMAL LOW (ref 12.0–16.0)
Lymphocyte #: 1.8 10*3/uL (ref 1.0–3.6)
Lymphocyte %: 18.8 %
MCH: 28.8 pg (ref 26.0–34.0)
MCHC: 32.3 g/dL (ref 32.0–36.0)
MCV: 89 fL (ref 80–100)
Monocyte #: 0.7 x10 3/mm (ref 0.2–0.9)
Monocyte %: 7 %
NEUTROS PCT: 71.6 %
Neutrophil #: 7 10*3/uL — ABNORMAL HIGH (ref 1.4–6.5)
PLATELETS: 237 10*3/uL (ref 150–440)
RBC: 4.1 10*6/uL (ref 3.80–5.20)
RDW: 15.1 % — ABNORMAL HIGH (ref 11.5–14.5)
WBC: 9.8 10*3/uL (ref 3.6–11.0)

## 2014-05-27 LAB — CLOSTRIDIUM DIFFICILE(ARMC)

## 2014-05-28 LAB — CBC WITH DIFFERENTIAL/PLATELET
Basophil #: 0.1 10*3/uL (ref 0.0–0.1)
Basophil %: 1 %
EOS PCT: 3.1 %
Eosinophil #: 0.3 10*3/uL (ref 0.0–0.7)
HCT: 34 % — ABNORMAL LOW (ref 35.0–47.0)
HGB: 11.2 g/dL — AB (ref 12.0–16.0)
LYMPHS PCT: 24.1 %
Lymphocyte #: 2.1 10*3/uL (ref 1.0–3.6)
MCH: 29.4 pg (ref 26.0–34.0)
MCHC: 32.9 g/dL (ref 32.0–36.0)
MCV: 89 fL (ref 80–100)
MONO ABS: 0.7 x10 3/mm (ref 0.2–0.9)
Monocyte %: 8.7 %
NEUTROS ABS: 5.5 10*3/uL (ref 1.4–6.5)
Neutrophil %: 63.1 %
Platelet: 211 10*3/uL (ref 150–440)
RBC: 3.8 10*6/uL (ref 3.80–5.20)
RDW: 15.2 % — ABNORMAL HIGH (ref 11.5–14.5)
WBC: 8.7 10*3/uL (ref 3.6–11.0)

## 2014-05-28 LAB — BASIC METABOLIC PANEL
Anion Gap: 10 (ref 7–16)
BUN: 6 mg/dL — AB (ref 7–18)
CALCIUM: 8.2 mg/dL — AB (ref 8.5–10.1)
Chloride: 99 mmol/L (ref 98–107)
Co2: 24 mmol/L (ref 21–32)
Creatinine: 0.89 mg/dL (ref 0.60–1.30)
EGFR (Non-African Amer.): >60
GLUCOSE: 104 mg/dL — AB (ref 65–99)
Osmolality: 264 (ref 275–301)
Potassium: 3.5 mmol/L (ref 3.5–5.1)
Sodium: 133 mmol/L — ABNORMAL LOW (ref 136–145)

## 2014-05-29 LAB — URINE CULTURE

## 2014-06-17 ENCOUNTER — Encounter: Payer: Self-pay | Admitting: Cardiovascular Disease

## 2014-06-17 ENCOUNTER — Ambulatory Visit (INDEPENDENT_AMBULATORY_CARE_PROVIDER_SITE_OTHER): Payer: Commercial Managed Care - HMO | Admitting: Cardiovascular Disease

## 2014-06-17 VITALS — BP 120/62 | HR 64 | Ht 62.0 in | Wt 148.0 lb

## 2014-06-17 DIAGNOSIS — I4891 Unspecified atrial fibrillation: Secondary | ICD-10-CM | POA: Diagnosis not present

## 2014-06-17 DIAGNOSIS — I10 Essential (primary) hypertension: Secondary | ICD-10-CM | POA: Diagnosis not present

## 2014-06-17 DIAGNOSIS — R609 Edema, unspecified: Secondary | ICD-10-CM | POA: Diagnosis not present

## 2014-06-17 DIAGNOSIS — K5669 Other intestinal obstruction: Secondary | ICD-10-CM | POA: Diagnosis not present

## 2014-06-17 DIAGNOSIS — K56609 Unspecified intestinal obstruction, unspecified as to partial versus complete obstruction: Secondary | ICD-10-CM

## 2014-06-17 DIAGNOSIS — I5033 Acute on chronic diastolic (congestive) heart failure: Secondary | ICD-10-CM

## 2014-06-17 NOTE — Assessment & Plan Note (Signed)
Recommended she take Lasix daily, holding the Lasix as lower extremity edema  improves, extra Lasix for worsening leg edema. Take with potassium

## 2014-06-17 NOTE — Assessment & Plan Note (Signed)
Blood pressure is well controlled on today's visit. No changes made to the medications. 

## 2014-06-17 NOTE — Assessment & Plan Note (Signed)
Recommended that she continue on her lactulose, increasing the dose for any signs of constipation. Also recommended a liquid diet for any constipation or abdominal discomfort

## 2014-06-17 NOTE — Assessment & Plan Note (Signed)
Recommended that she stay on her amiodarone.  maintaining normal sinus rhythm

## 2014-06-17 NOTE — Assessment & Plan Note (Signed)
Trace edema likely from diastolic CHF with small component of venous insufficiency. Recommended compression hose, continue Lasix

## 2014-06-17 NOTE — Progress Notes (Signed)
Patient ID: Kathryn Short, female    DOB: [birth year], 81 y.o.   MRN: [id number]  HPI Comments:  Ms. Kathryn Short is an 81 year old woman with paroxysmal atrial fibrillation (last episode in March 3299), diastolic CHF on a low-dose diuretic 4 days per week, labile hypertension,  sick sinus syndrome, pacemaker placement followed by Dr. Caryl Comes,  who presented to the hospital [facility] on 02/16/2012 in atrial fibrillation. She presents today for routine followup for her blood pressure and weight gain him a leg edema. Her past surgical history includes open cholecystectomy, partial colon resection and colostomy, appendectomy, hysterectomy, oophorectomy, GYN malignancy treated with radiation in the 1950s Recent bowel obstruction December 2015  She was in the hospital last month, 05/25/2014, with small bowel obstruction. She was placed on bowel rest, IV fluids and after several days of stressful waiting, she passed her bowels. She did not require surgery Imaging did confirm obstruction In follow-up today, she is taking lactulose daily and reports having one very loose bowel movement per day. She denies any abdominal pain, no nausea vomiting. Prior to her hospitalization she had significant lower extremity edema and we were increasing her diuretic regimen. Since leaving the hospital, she has had minimal, trace edema up to the mid shin She has been taking Lasix daily with potassium but also has been drinking less at home Daughter brings blood pressure measurements with her today and they are typically in the 242-683 range systolic with heart rates in the 60s  EKG on today's visit shows normal sinus rhythm with rate 65 bpm, no significant ST or T-wave changes  Echocardiogram done in the hospital 05/25/2014 showing ejection fraction greater than 55%, moderate pulmonary hypertension, mild to moderate TR  Other past medical history Earlier in 2015, she had atrial tachycardia and pacer was tracking the atrial rhythm,   Her device was reprogrammed in the office for DDI  She was continued on her beta blocker and amiodarone and rhythm broke, converting back to normal sinus rhythm. Pacemaker has been since changed back to her prior settings   She is reluctant to use a walker, still uses a cane. Anticoagulation  discussed previouslywith the patient and her daughter. They are not interested. They preferred to stay on aspirin  history of falls with trauma requiring stitches. None recently Patient and daughter are afraid of her fall risk, other complications from anticoagulation. Currently not on anticoagulation at their request. They are aware of risks and benefits associated with arrhythmia and stroke.  Previous lab work; Total cholesterol 189, LDL 104, hematocrit 38, creatinine 0.8  previous visits to the  emergency room with chest pain, dizziness, malaise. She had a problem on isosorbide in the past. Feelings of insomnia, and anxious feeling, chest pain episodes at night that wake her from sleep, weakness, nausea, dizziness. She has obstructive sleep apnea and does not use her BiPAP at nighttime  Echocardiogram in the hospital showed ejection fraction 50-55%, moderate LVH, mild to moderate TR, right ventricular systolic pressure 40 mm of mercury, mild to moderate aortic valve insufficiency   Outpatient Encounter Prescriptions as of 06/17/2014  Medication Sig  . acetaminophen (TYLENOL) 500 MG tablet Takes 2-4 tablets daily.  Marland Kitchen amiodarone (PACERONE) 200 MG tablet Take 0.5 tablets (100 mg total) by mouth daily.  Marland Kitchen aspirin 81 MG tablet Take 162 mg by mouth daily.  Marland Kitchen b complex vitamins tablet Take 1 tablet by mouth daily.  . cloNIDine (CATAPRES) 0.1 MG tablet Take 1 tablet (0.1 mg total) by mouth 3 (  three) times daily as needed.  . dicyclomine (BENTYL) 10 MG capsule Take 10 mg by mouth daily. As needed  . guaiFENesin (MUCINEX) 600 MG 12 hr tablet Take 1 tablet (600 mg total) by mouth 2 (two) times daily as needed.   Marland Kitchen HYDROcodone-acetaminophen (VICODIN) 5-500 MG per tablet Take 1/2 tablet daily.  Marland Kitchen lactulose (CHRONULAC) 10 GM/15ML solution Take 30 g by mouth daily.   Marland Kitchen levothyroxine (SYNTHROID, LEVOTHROID) 50 MCG tablet Take 50 mcg by mouth daily.   Marland Kitchen losartan (COZAAR) 100 MG tablet Take 1 tablet (100 mg total) by mouth daily.  . metoprolol (LOPRESSOR) 50 MG tablet Take 1 tablet (50 mg total) by mouth 2 (two) times daily.  . Multiple Vitamin (MULTIVITAMIN) tablet Take 1 tablet by mouth daily.  . nitrofurantoin, macrocrystal-monohydrate, (MACROBID) 100 MG capsule Take 100 mg by mouth 2 (two) times daily as needed.   . nitroGLYCERIN (NITROSTAT) 0.4 MG SL tablet Place 1 tablet (0.4 mg total) under the tongue every 5 (five) minutes as needed for chest pain.  . potassium chloride (K-DUR) 10 MEQ tablet Take 1 tablet alternate with 1/2 tablet.  . Probiotic Product (PROBIOTIC DAILY PO) Take by mouth daily.  Marland Kitchen torsemide (DEMADEX) 20 MG tablet Take 20 mg by mouth. Takes 1/2 tablet 4 times weekly.  . [DISCONTINUED] docusate sodium (COLACE) 100 MG capsule Take 100 mg by mouth 2 (two) times daily.    Social history  reports that she has never smoked. She does not have any smokeless tobacco history on file. She reports that she does not drink alcohol or use illicit drugs.  Review of Systems  Respiratory: Negative.   Cardiovascular: Positive for leg swelling.  Gastrointestinal: Negative.   Musculoskeletal: Positive for gait problem.  Neurological: Positive for weakness.  All other systems reviewed and are negative.   BP 120/62 mmHg  Pulse 64  Ht 5\' 2"  (1.575 m)  Wt 148 lb (67.132 kg)  BMI 27.06 kg/m2  Physical Exam  Constitutional: She is oriented to person, place, and time. She appears well-developed and well-nourished.  HENT:  Head: Normocephalic.  Nose: Nose normal.  Mouth/Throat: Oropharynx is clear and moist.  Eyes: Conjunctivae are normal. Pupils are equal, round, and reactive to light.  Neck:  Normal range of motion. Neck supple. No JVD present.  Cardiovascular: Normal rate, regular rhythm, S1 normal, S2 normal, normal heart sounds and intact distal pulses.  Exam reveals no gallop and no friction rub.   No murmur heard. Trace pitting edema to the mid shins  Pulmonary/Chest: Effort normal and breath sounds normal. No respiratory distress. She has no wheezes. She has no rales. She exhibits no tenderness.  Abdominal: Soft. Bowel sounds are normal. She exhibits no distension. There is no tenderness.  Musculoskeletal: Normal range of motion. She exhibits edema. She exhibits no tenderness.  Lymphadenopathy:    She has no cervical adenopathy.  Neurological: She is alert and oriented to person, place, and time. Coordination normal.  Skin: Skin is warm and dry. No rash noted. No erythema.  Psychiatric: She has a normal mood and affect. Her behavior is normal. Judgment and thought content normal.    Assessment and Plan  Nursing note and vitals reviewed.

## 2014-06-17 NOTE — Patient Instructions (Signed)
You are doing well. No medication changes were made.  Please call us if you have new issues that need to be addressed before your next appt.  Your physician wants you to follow-up in: 1 months.

## 2014-06-24 DIAGNOSIS — R300 Dysuria: Secondary | ICD-10-CM | POA: Diagnosis not present

## 2014-07-16 ENCOUNTER — Encounter: Payer: Self-pay | Admitting: Internal Medicine

## 2014-07-21 ENCOUNTER — Encounter: Payer: Self-pay | Admitting: Cardiovascular Disease

## 2014-07-21 ENCOUNTER — Ambulatory Visit (INDEPENDENT_AMBULATORY_CARE_PROVIDER_SITE_OTHER): Payer: Commercial Managed Care - HMO | Admitting: Cardiovascular Disease

## 2014-07-21 VITALS — BP 120/60 | HR 63 | Ht 62.0 in | Wt 150.2 lb

## 2014-07-21 DIAGNOSIS — I5033 Acute on chronic diastolic (congestive) heart failure: Secondary | ICD-10-CM

## 2014-07-21 DIAGNOSIS — I4891 Unspecified atrial fibrillation: Secondary | ICD-10-CM

## 2014-07-21 DIAGNOSIS — R609 Edema, unspecified: Secondary | ICD-10-CM

## 2014-07-21 DIAGNOSIS — I10 Essential (primary) hypertension: Secondary | ICD-10-CM

## 2014-07-21 NOTE — Patient Instructions (Signed)
You are doing well. No medication changes were made.  Check the weight at home If weight goes upwards, take full lasix instead of 1/2 lasix If weight goes down, take 1/2 lasix every other day  If blood pressure is low in the evening/night, Cut the losartan and clonidine in 1/2   Please call us if you have new issues that need to be addressed before your next appt.  Your physician wants you to follow-up in: 1 month.

## 2014-07-21 NOTE — Progress Notes (Signed)
Patient ID: Kathryn Short, female    DOB: May 11, 1922, 81 y.o.   MRN: [id number]  HPI Comments:  Kathryn Short is an 81 year old woman with paroxysmal atrial fibrillation (last episode in March 9983), diastolic CHF on a low-dose diuretic daily, labile hypertension,  sick sinus syndrome, pacemaker placement followed by Dr. Caryl Comes,  who presented to the hospital [facility] on 02/16/2012 in atrial fibrillation. She presents today for routine followup for her blood pressure and weight gain him a leg edema. Her past surgical history includes open cholecystectomy, partial colon resection and colostomy, appendectomy, hysterectomy, oophorectomy, GYN malignancy treated with radiation in the 1950s Recent bowel obstruction December 2015 She presents for routine follow-up of her blood pressure.  In follow-up today, but pressures typically 382N up to 053 systolic. Heart rates running 60-70 She's been taking Lasix either whole pill or half pill daily. Leg edema has significantly improved, now minimal bilaterally. She is sleeping well, active. No recent falls. Daughter concerned that blood pressure sometimes runs in the 120s in the evenings for giving her evening medication She is taking lactulose daily with no significant constipation  EKG on today's visit shows normal sinus rhythm with rate 63 bpm, no significant ST or T-wave changes   in the hospital 05/25/2014, with small bowel obstruction. She was placed on bowel rest, IV fluids and  she passed her bowels. She did not require surgery  Echocardiogram done in the hospital 05/25/2014 showing ejection fraction greater than 55%, moderate pulmonary hypertension, mild to moderate TR  Other past medical history Earlier in 2015, she had atrial tachycardia and pacer was tracking the atrial rhythm,  Her device was reprogrammed in the office for DDI  She was continued on her beta blocker and amiodarone and rhythm broke, converting back to normal sinus rhythm. Pacemaker has  been since changed back to her prior settings   She is reluctant to use a walker, still uses a cane. Anticoagulation  discussed previouslywith the patient and her daughter. They are not interested. They preferred to stay on aspirin  history of falls with trauma requiring stitches. None recently Patient and daughter are afraid of her fall risk, other complications from anticoagulation. Currently not on anticoagulation at their request. They are aware of risks and benefits associated with arrhythmia and stroke.  Previous lab work; Total cholesterol 189, LDL 104, hematocrit 38, creatinine 0.8  previous visits to the  emergency room with chest pain, dizziness, malaise. She had a problem on isosorbide in the past. Feelings of insomnia, and anxious feeling, chest pain episodes at night that wake her from sleep, weakness, nausea, dizziness. She has obstructive sleep apnea and does not use her BiPAP at nighttime  Echocardiogram in the hospital showed ejection fraction 50-55%, moderate LVH, mild to moderate TR, right ventricular systolic pressure 40 mm of mercury, mild to moderate aortic valve insufficiency   Allergies  Allergen Reactions  . Ace Inhibitors   . Ambien [Zolpidem Tartrate]   . Bactrim [Sulfamethoxazole-Trimethoprim]   . Digoxin And Related   . Hctz [Hydrochlorothiazide]   . Lunesta [Eszopiclone]     Outpatient Encounter Prescriptions as of 07/21/2014  Medication Sig  . acetaminophen (TYLENOL) 500 MG tablet Takes 2-4 tablets daily.  Marland Kitchen amiodarone (PACERONE) 200 MG tablet Take 0.5 tablets (100 mg total) by mouth daily.  Marland Kitchen aspirin 81 MG tablet Take 162 mg by mouth daily.  Marland Kitchen b complex vitamins tablet Take 1 tablet by mouth daily.  . cloNIDine (CATAPRES) 0.1 MG tablet Take 1 tablet (  0.1 mg total) by mouth 3 (three) times daily as needed.  . dicyclomine (BENTYL) 10 MG capsule Take 10 mg by mouth daily. As needed  . guaiFENesin (MUCINEX) 600 MG 12 hr tablet Take 1 tablet (600 mg total)  by mouth 2 (two) times daily as needed.  . lactulose (CHRONULAC) 10 GM/15ML solution Take 30 g by mouth daily.   Marland Kitchen levothyroxine (SYNTHROID, LEVOTHROID) 50 MCG tablet Take 50 mcg by mouth daily.   Marland Kitchen losartan (COZAAR) 100 MG tablet Take 1 tablet (100 mg total) by mouth daily.  . metoprolol (LOPRESSOR) 50 MG tablet Take 1 tablet (50 mg total) by mouth 2 (two) times daily.  . Multiple Vitamin (MULTIVITAMIN) tablet Take 1 tablet by mouth daily.  . nitrofurantoin, macrocrystal-monohydrate, (MACROBID) 100 MG capsule Take 100 mg by mouth 2 (two) times daily as needed.   . nitroGLYCERIN (NITROSTAT) 0.4 MG SL tablet Place 1 tablet (0.4 mg total) under the tongue every 5 (five) minutes as needed for chest pain.  . potassium chloride (K-DUR) 10 MEQ tablet Take 1 tablet alternate with 1/2 tablet.  . Probiotic Product (PROBIOTIC DAILY PO) Take by mouth daily.  Marland Kitchen torsemide (DEMADEX) 20 MG tablet Take one tablet alternate with half tablet daily.  . [DISCONTINUED] HYDROcodone-acetaminophen (VICODIN) 5-500 MG per tablet Take 1/2 tablet daily.  . [DISCONTINUED] torsemide (DEMADEX) 20 MG tablet Take 20 mg by mouth. Takes 1/2 tablet 4 times weekly.    Past Medical History  Diagnosis Date  . Atrial fibrillation   . Hypertension   . OSA (obstructive sleep apnea)     On BiPAP  . GERD (gastroesophageal reflux disease)   . Hypothyroidism     Treated  . Uterine cancer     s/p radiation therapy in 1952  . Melanoma     Resected from back  . Fall   . Bowel obstruction     Past Surgical History  Procedure Laterality Date  . Total abdominal hysterectomy w/ bilateral salpingoophorectomy    . Colostomy      For perforation  . Cholecystectomy    . Cytoscopy    . Insert / replace / remove pacemaker      Implantation-[employer]  . Skin biopsy      face    Social History  reports that she has never smoked. She does not have any smokeless tobacco history on file. She reports that she does not drink alcohol or  use illicit drugs.  Family History Family history is unknown by patient.  Review of Systems  Respiratory: Negative.   Cardiovascular: Negative.   Gastrointestinal: Negative.   Musculoskeletal: Positive for gait problem.  Neurological: Negative.   All other systems reviewed and are negative.   BP 120/60 mmHg  Pulse 63  Ht 5\' 2"  (1.575 m)  Wt 150 lb 4 oz (68.153 kg)  BMI 27.47 kg/m2  Physical Exam  Constitutional: She is oriented to person, place, and time. She appears well-developed and well-nourished.  HENT:  Head: Normocephalic.  Nose: Nose normal.  Mouth/Throat: Oropharynx is clear and moist.  Eyes: Conjunctivae are normal. Pupils are equal, round, and reactive to light.  Neck: Normal range of motion. Neck supple. No JVD present.  Cardiovascular: Normal rate, regular rhythm, S1 normal, S2 normal, normal heart sounds and intact distal pulses.  Exam reveals no gallop and no friction rub.   No murmur heard. Trace pitting edema to the mid shins  Pulmonary/Chest: Effort normal and breath sounds normal. No respiratory distress. She has no  wheezes. She has no rales. She exhibits no tenderness.  Abdominal: Soft. Bowel sounds are normal. She exhibits no distension. There is no tenderness.  Musculoskeletal: Normal range of motion. She exhibits edema. She exhibits no tenderness.  Lymphadenopathy:    She has no cervical adenopathy.  Neurological: She is alert and oriented to person, place, and time. Coordination normal.  Skin: Skin is warm and dry. No rash noted. No erythema.  Psychiatric: She has a normal mood and affect. Her behavior is normal. Judgment and thought content normal.    Assessment and Plan  Nursing note and vitals reviewed.

## 2014-07-21 NOTE — Assessment & Plan Note (Signed)
She appears relatively euvolemic on today's visit. Minimal leg edema, possibly trace. Suggested that she weigh herself at home and take extra Lasix for weight gain, and change Lasix to every other day for further weight loss

## 2014-07-21 NOTE — Assessment & Plan Note (Signed)
Edema has essentially resolved on Lasix daily. We'll continue either half dose are full dose depending on her weight and leg edema

## 2014-07-21 NOTE — Assessment & Plan Note (Signed)
Blood pressure is well controlled on today's visit. No changes made to the medications. Systolic pressure 961 up to 150

## 2014-07-21 NOTE — Assessment & Plan Note (Signed)
Recommended that she stay on her amiodarone.  maintaining normal sinus rhythm

## 2014-07-22 DIAGNOSIS — K5900 Constipation, unspecified: Secondary | ICD-10-CM | POA: Diagnosis not present

## 2014-07-22 DIAGNOSIS — I480 Paroxysmal atrial fibrillation: Secondary | ICD-10-CM | POA: Diagnosis not present

## 2014-07-22 DIAGNOSIS — N390 Urinary tract infection, site not specified: Secondary | ICD-10-CM | POA: Diagnosis not present

## 2014-07-22 DIAGNOSIS — I10 Essential (primary) hypertension: Secondary | ICD-10-CM | POA: Diagnosis not present

## 2014-07-22 DIAGNOSIS — K219 Gastro-esophageal reflux disease without esophagitis: Secondary | ICD-10-CM | POA: Diagnosis not present

## 2014-08-26 ENCOUNTER — Encounter: Payer: Self-pay | Admitting: Cardiovascular Disease

## 2014-08-26 ENCOUNTER — Ambulatory Visit (INDEPENDENT_AMBULATORY_CARE_PROVIDER_SITE_OTHER): Payer: Commercial Managed Care - HMO | Admitting: Cardiovascular Disease

## 2014-08-26 VITALS — BP 120/60 | HR 64 | Ht 62.0 in | Wt 152.2 lb

## 2014-08-26 DIAGNOSIS — R296 Repeated falls: Secondary | ICD-10-CM

## 2014-08-26 DIAGNOSIS — K5669 Other intestinal obstruction: Secondary | ICD-10-CM

## 2014-08-26 DIAGNOSIS — I5033 Acute on chronic diastolic (congestive) heart failure: Secondary | ICD-10-CM | POA: Diagnosis not present

## 2014-08-26 DIAGNOSIS — I10 Essential (primary) hypertension: Secondary | ICD-10-CM | POA: Diagnosis not present

## 2014-08-26 DIAGNOSIS — I4891 Unspecified atrial fibrillation: Secondary | ICD-10-CM

## 2014-08-26 DIAGNOSIS — R609 Edema, unspecified: Secondary | ICD-10-CM | POA: Diagnosis not present

## 2014-08-26 DIAGNOSIS — Z950 Presence of cardiac pacemaker: Secondary | ICD-10-CM | POA: Diagnosis not present

## 2014-08-26 DIAGNOSIS — K56609 Unspecified intestinal obstruction, unspecified as to partial versus complete obstruction: Secondary | ICD-10-CM

## 2014-08-26 NOTE — Assessment & Plan Note (Signed)
Mild worsening of her edema, slightly worse on the left than the right. We'll increase her diuretics slightly

## 2014-08-26 NOTE — Assessment & Plan Note (Signed)
She is on daily lactulose with no recurrences of her abdominal pain or constipation. Previous hospitalization for bowel obstruction

## 2014-08-26 NOTE — Assessment & Plan Note (Addendum)
Blood pressure ranging typically in the 140 range, occasional 160-170 in the a.m. but this is rare, heart rates in the 60s No medication changes made

## 2014-08-26 NOTE — Assessment & Plan Note (Signed)
No recent falls. Walks with a cane Also walks with assistance.

## 2014-08-26 NOTE — Patient Instructions (Signed)
You are doing well. No medication changes were made.  Please call us if you have new issues that need to be addressed before your next appt.  Your physician wants you to follow-up in: 1 month.

## 2014-08-26 NOTE — Assessment & Plan Note (Signed)
Appears to be maintaining normal sinus rhythm on her current regimen. We'll continue low-dose amiodarone, metoprolol She's not on anticoagulation. Previous discussion with the family, they did not want anticoagulation

## 2014-08-26 NOTE — Assessment & Plan Note (Signed)
Doing well though with slightly more leg edema than previous visit. Recommended she take full dose torsemide for worsening leg edema periodically through the week, perhaps alternating with half dose torsemide

## 2014-08-26 NOTE — Progress Notes (Signed)
Patient ID: Kathryn Short, female    DOB: 03-06-1922, 81 y.o.   MRN: [id number]  HPI Comments:  Kathryn Short is an 81 year old woman with paroxysmal atrial fibrillation (last episode in March 7412), diastolic CHF on a low-dose diuretic daily, labile hypertension,  sick sinus syndrome, pacemaker placement followed by Dr. Caryl Comes,  who presented to the hospital [facility] on 02/16/2012 in atrial fibrillation. She presents today for routine followup for her blood pressure and weight gain him a leg edema. Her past surgical history includes open cholecystectomy, partial colon resection and colostomy, appendectomy, hysterectomy, oophorectomy, GYN malignancy treated with radiation in the 1950s Recent bowel obstruction December 2015 She presents for routine follow-up of her blood pressure.  In follow up today, blood pressure typically 140s, rarely in the morning is elevated. Weight is notably stable. Taking either full dose torsemide or half dose torsemide on a daily basis Legs possibly more edematous recently, slight increasing shortness of breath She is taking lactulose daily with no significant constipation No recent falls  EKG on today's visit shows normal sinus rhythm with rate 64 bpm, no significant ST or T-wave changes  Other past medical history  in the hospital 05/25/2014, with small bowel obstruction. She was placed on bowel rest, IV fluids and  she passed her bowels. She did not require surgery  Echocardiogram done in the hospital 05/25/2014 showing ejection fraction greater than 55%, moderate pulmonary hypertension, mild to moderate TR  Earlier in 2015, she had atrial tachycardia and pacer was tracking the atrial rhythm,  Her device was reprogrammed in the office for DDI  She was continued on her beta blocker and amiodarone and rhythm broke, converting back to normal sinus rhythm. Pacemaker has been since changed back to her prior settings   She is reluctant to use a walker, still uses a cane.  Anticoagulation  discussed previouslywith the patient and her daughter. They are not interested. They preferred to stay on aspirin  history of falls with trauma requiring stitches. None recently Patient and daughter are afraid of her fall risk, other complications from anticoagulation. Currently not on anticoagulation at their request. They are aware of risks and benefits associated with arrhythmia and stroke.  Previous lab work; Total cholesterol 189, LDL 104, hematocrit 38, creatinine 0.8  previous visits to the  emergency room with chest pain, dizziness, malaise. She had a problem on isosorbide in the past. Feelings of insomnia, and anxious feeling, chest pain episodes at night that wake her from sleep, weakness, nausea, dizziness. She has obstructive sleep apnea and does not use her BiPAP at nighttime  Echocardiogram in the hospital showed ejection fraction 50-55%, moderate LVH, mild to moderate TR, right ventricular systolic pressure 40 mm of mercury, mild to moderate aortic valve insufficiency   Allergies  Allergen Reactions  . Ace Inhibitors   . Ambien [Zolpidem Tartrate]   . Bactrim [Sulfamethoxazole-Trimethoprim]   . Digoxin And Related   . Hctz [Hydrochlorothiazide]   . Lunesta [Eszopiclone]     Outpatient Encounter Prescriptions as of 08/26/2014  Medication Sig  . acetaminophen (TYLENOL) 500 MG tablet Takes 2-4 tablets daily.  Marland Kitchen amiodarone (PACERONE) 200 MG tablet Take 0.5 tablets (100 mg total) by mouth daily.  Marland Kitchen aspirin 81 MG tablet Take 162 mg by mouth daily.  Marland Kitchen b complex vitamins tablet Take 1 tablet by mouth daily.  . cloNIDine (CATAPRES) 0.1 MG tablet Take 1 tablet (0.1 mg total) by mouth 3 (three) times daily as needed.  . dicyclomine (BENTYL) 10  MG capsule Take 10 mg by mouth daily. As needed  . guaiFENesin (MUCINEX) 600 MG 12 hr tablet Take 1 tablet (600 mg total) by mouth 2 (two) times daily as needed.  . lactulose (CHRONULAC) 10 GM/15ML solution Take 30 g by  mouth daily.   Marland Kitchen levothyroxine (SYNTHROID, LEVOTHROID) 50 MCG tablet Take 50 mcg by mouth daily.   Marland Kitchen losartan (COZAAR) 100 MG tablet Take 1 tablet (100 mg total) by mouth daily.  . metoprolol (LOPRESSOR) 50 MG tablet Take 1 tablet (50 mg total) by mouth 2 (two) times daily.  . Multiple Vitamin (MULTIVITAMIN) tablet Take 1 tablet by mouth daily.  . nitrofurantoin, macrocrystal-monohydrate, (MACROBID) 100 MG capsule Take 100 mg by mouth 2 (two) times daily as needed.   . nitroGLYCERIN (NITROSTAT) 0.4 MG SL tablet Place 1 tablet (0.4 mg total) under the tongue every 5 (five) minutes as needed for chest pain.  . potassium chloride (K-DUR) 10 MEQ tablet Take 1 tablet alternate with 1/2 tablet.  . Probiotic Product (PROBIOTIC DAILY PO) Take by mouth daily.  Marland Kitchen torsemide (DEMADEX) 20 MG tablet Take one tablet alternate with half tablet daily.    Past Medical History  Diagnosis Date  . Atrial fibrillation   . Hypertension   . OSA (obstructive sleep apnea)     On BiPAP  . GERD (gastroesophageal reflux disease)   . Hypothyroidism     Treated  . Uterine cancer     s/p radiation therapy in 1952  . Melanoma     Resected from back  . Fall   . Bowel obstruction     Past Surgical History  Procedure Laterality Date  . Total abdominal hysterectomy w/ bilateral salpingoophorectomy    . Colostomy      For perforation  . Cholecystectomy    . Cytoscopy    . Insert / replace / remove pacemaker      Implantation-[employer]  . Skin biopsy      face    Social History  reports that she has never smoked. She does not have any smokeless tobacco history on file. She reports that she does not drink alcohol or use illicit drugs.  Family History Family history is unknown by patient.  Review of Systems  Respiratory: Negative.   Cardiovascular: Negative.   Gastrointestinal: Negative.   Musculoskeletal: Positive for gait problem.  Neurological: Negative.   All other systems reviewed and are  negative.   BP 120/60 mmHg  Pulse 64  Ht 5\' 2"  (1.575 m)  Wt 152 lb 4 oz (69.06 kg)  BMI 27.84 kg/m2  Physical Exam  Constitutional: She is oriented to person, place, and time. She appears well-developed and well-nourished.  HENT:  Head: Normocephalic.  Nose: Nose normal.  Mouth/Throat: Oropharynx is clear and moist.  Eyes: Conjunctivae are normal. Pupils are equal, round, and reactive to light.  Neck: Normal range of motion. Neck supple. No JVD present.  Cardiovascular: Normal rate, regular rhythm, S1 normal, S2 normal, normal heart sounds and intact distal pulses.  Exam reveals no gallop and no friction rub.   No murmur heard. Trace pitting edema to the mid shins  Pulmonary/Chest: Effort normal and breath sounds normal. No respiratory distress. She has no wheezes. She has no rales. She exhibits no tenderness.  Abdominal: Soft. Bowel sounds are normal. She exhibits no distension. There is no tenderness.  Musculoskeletal: Normal range of motion. She exhibits edema. She exhibits no tenderness.  Lymphadenopathy:    She has no cervical adenopathy.  Neurological: She is alert and oriented to person, place, and time. Coordination normal.  Skin: Skin is warm and dry. No rash noted. No erythema.  Psychiatric: She has a normal mood and affect. Her behavior is normal. Judgment and thought content normal.    Assessment and Plan  Nursing note and vitals reviewed.

## 2014-08-26 NOTE — Assessment & Plan Note (Signed)
Followed by Dr. Klein 

## 2014-09-02 DIAGNOSIS — J069 Acute upper respiratory infection, unspecified: Secondary | ICD-10-CM | POA: Diagnosis not present

## 2014-09-02 DIAGNOSIS — I10 Essential (primary) hypertension: Secondary | ICD-10-CM | POA: Diagnosis not present

## 2014-09-02 DIAGNOSIS — K219 Gastro-esophageal reflux disease without esophagitis: Secondary | ICD-10-CM | POA: Diagnosis not present

## 2014-09-02 DIAGNOSIS — I480 Paroxysmal atrial fibrillation: Secondary | ICD-10-CM | POA: Diagnosis not present

## 2014-09-28 ENCOUNTER — Ambulatory Visit: Payer: Commercial Managed Care - HMO | Admitting: Cardiovascular Disease

## 2014-09-28 NOTE — H&P (Signed)
PATIENT NAME:  Kathryn Short, Kathryn Short MR#:  [id number] DATE OF BIRTH:  04-01-1922  DATE OF ADMISSION:  02/16/2012  ADMITTING PHYSICIAN: Gladstone Lighter, MD    PRIMARY MD: Tracie Harrier, MD   PRIMARY CARDIOLOGIST: Virl Axe, MD   CHIEF COMPLAINT: Dizziness.   HISTORY OF PRESENT ILLNESS: Ms. Quinonez is a very pleasant 81 year old elderly Caucasian female with past medical history significant for hypertension, paroxysmal atrial fibrillation with tachybrady syndrome status post pacemaker placement, and hypothyroidism who presents to the hospital with the above-mentioned complaints. The patient's daughter who is at bedside is the primary historian who lives next door to the patient. According to the daughter, the patient has been feeling weak and generally not well over the past one month. For the past week she has been extremely dizzy. She has seen Dr. Caryl Comes about 2 to 3 weeks ago and had her pacemaker checked and everything was fine. From the records from Dr. Olin Pia office, her rhythm was in normal sinus rhythm at the time during the office visit. However, since yesterday the patient has been extremely dizzy especially when she stands up, that she could not even stand up. The patient's daughter checked her pulse all day yesterday and that has been running anywhere from 100 to 150 range. She is currently in atrial fibrillation rhythm with heart rate around 130 to 140 range and symptomatic whenever she stands up. She is being admitted for treatment of the same. She denies any other complaints at this time.   PAST MEDICAL HISTORY:  1. Hypertension.  2. Gastroesophageal reflux disease.  3. Paroxysmal atrial fibrillation.  4. History of tachybrady syndrome, status post pacemaker placement.  5. Osteoporosis.  6. History of uterine cancer, status post radiation treatment and surgery.   PAST SURGICAL HISTORY:  1. Total abdominal hysterectomy with bilateral salpingo-oophorectomy.  2. Colostomy done  secondary to perforation of colon from her radiation treatment.  3. Takedown of colostomy.  4. Cholecystectomy.  5. Appendectomy.  6. Cataract extraction and intraocular lens implantation.  7. Skin cancer excised from the nose.   ALLERGIES: No known drug allergies.   MEDICATIONS AT HOME:  1. Losartan/HCTZ 50/12.5 mg 1 tablet p.o. daily.  2. Multivitamin 1 tablet p.o. daily.  3. B Complex 1 tablet p.o. daily.  4. Dicyclomine 10 mg p.o. daily as needed for abdominal pain.  5. Synthroid 50 mcg p.o. daily.  6. Coumadin 5 mg p.o. daily.  7. Cartia 180 mg p.o. daily. 8. Omeprazole 20 mg p.o. daily.  9. Colace 100 mg p.o. b.Short.d.  10. Hydrocodone/acetaminophen 5/500 mg half-tablet at bedtime as needed for sleeping.   ALLERGIES: The patient is intolerant of Ambien, HCTZ, ACE inhibitors, Lunesta, amiodarone, Bactrim, and also Digoxin.   SOCIAL HISTORY: The patient lives at home by herself. No smoking or alcohol. Her daughter lives next door and also granddaughter lives on the other side of her home.   FAMILY HISTORY: Father died from unknown cancer. Mom had diabetes and heart disease.   REVIEW OF SYSTEMS: CONSTITUTIONAL: No fever. Positive for fatigue and weakness. EYES: Status post cataract surgery and she also had some decreased sensation on the medial side of the right eye going on for a couple of months now. No inflammation or glaucoma. ENT: No tinnitus or ear pain. Positive for hearing loss. RESPIRATORY: Positive for chronic cough and sinus problems. No wheeze, hemoptysis, or COPD. CARDIOVASCULAR: No chest pain, orthopnea, edema, or syncope. Positive for arrhythmia and palpitations. GI: Positive for nausea and intermittent chronic abdominal  pain especially in the lower abdomen. No vomiting. She had diarrhea last week. No hematemesis, melena, or rectal bleed. GU: No dysuria. No frequency. Positive for incontinence. ENDOCRINE: No polydipsia or nocturia. Positive for hypothyroidism. No heat or  cold intolerance. HEMATOLOGY: No anemia, easy bruising or bleeding. SKIN: No acne, rash, or lesions. MUSCULOSKELETAL: No neck, back, or shoulder pain. Positive for arthritis and osteoporosis. NEUROLOGIC: No numbness or weakness. Positive for extreme dizziness. No CVA or TIA. PSYCHOLOGICAL: No anxiety, insomnia, or depression.   PHYSICAL EXAMINATION:   VITAL SIGNS: Temperature 96.4 degrees Fahrenheit, pulse was 110 when she came in, currently is from 130 to 140 range, blood pressure 141/95, pulse oximetry 97% on room air, respirations 18.   GENERAL: Well built, well nourished female lying in bed not in any acute distress.   HEENT: Normocephalic, atraumatic. Pupils equal, round, reacting to light. Anicteric sclerae. There is a small less than 1 cm scleral thinning noted on the medial side of the right eye which is where she's been complaining of a foreign body sensation. Extraocular movements are intact. Oropharynx is clear without erythema, mass, or exudates.   NECK: Supple. No thyromegaly, JVD, or carotid bruits. No lymphadenopathy.   LUNGS: Clear to auscultation bilaterally. No wheeze or crackles. No use of accessory muscles for breathing.   CARDIOVASCULAR: S1, S2. Rapid rate. Irregular rhythm. No murmurs, rubs, or gallops.   ABDOMEN: Soft, mild discomfort in the lower abdomen on palpation. No guarding or rigidity. No tenderness per se. Normal bowel sounds present.   EXTREMITIES: No pedal edema at this time. No clubbing or cyanosis. Superficial varicosities and increased capillary refill on the feet noted. 2+ dorsalis pedis pulses are palpable bilaterally.   SKIN: No acne, rash, or lesions.   LYMPHATICS: No cervical or inguinal lymphadenopathy.   NEUROLOGIC: Cranial nerves intact. No focal motor or sensory deficits.   PSYCHOLOGICAL: The patient is awake, alert, oriented x3.   LABORATORY DATA: WBC 10.3, hemoglobin 12.6, hematocrit 37.0, platelet count 347, sodium 133, potassium 3.7,  chloride 97, bicarb 27, BUN 11, creatinine 0.74, glucose 116, calcium 8.9, ALT 26, AST 22, alkaline phosphatase 71, total bilirubin 0.3, albumin 3.6. INR 1.8. Troponin 0.09 and second set is 0.07. Urinalysis 1+ leukocyte esterase with no bacteria and 9 WBCs seen.   Chest x-ray showing mild hyperinflation consistent with COPD. No evidence of CHF or pneumonia are seen.   EKG showing atrial fibrillation with RVR, heart rate of 119 at this time.   ASSESSMENT AND PLAN: This is an 81 year old female with history of paroxysmal atrial fibrillation and tachybrady syndrome status post pacemaker, hypothyroidism, hypertension, and frequent urinary tract infections who was brought in for severe dizziness and was found to be in atrial fibrillation with RVR in spite of IV Cardizem push twice in the ED. 1. Dizziness likely secondary to her atrial fibrillation. Has known history of paroxysmal atrial fibrillation and was found to be in sinus rhythm two weeks ago, now currently in atrial fibrillation with heart rate 130 to 140 so that's the cause of dizziness likely with rhythm change. CT is ordered to rule out intracranial causes. Also has a history of falls occasionally.  2. Atrial fibrillation with RVR. Will admit her to telemetry floor. Cardiology has been consulted. Will get echo Doppler. IV metoprolol push p.r.n. and also p.o. Cardizem. Will continue Coumadin. At this time her INR is 1.8. Per Cardiology we are going to use IV amiodarone to see if she will be converted to normal sinus rhythm  for better symptom control at this time.  3. Hypertension. Continue losartan/HCTZ. Also, she is on Cardizem.  4. Hypothyroidism. Check TSH and continue her Synthroid.  5. Urinary tract infection. Has known history of frequent urinary tract infections. Her urinalysis has 1+ leukocyte esterase and no bacteria. Has chronic urinary tract infection history so will check cultures, less likely to be infection at this time as she is  afebrile, asymptomatic, and no WBC elevation. Will hold off on antibiotics at this time.  6. Elevated troponins are secondary to demand ischemia secondary to elevated heart rate.  7. GI and DVT prophylaxis. On Prilosec and Coumadin.   CODE STATUS: FULL CODE.    TIME SPENT ON ADMISSION: 50 minutes.   ____________________________ Gladstone Lighter, MD rk:[id number] D: 02/16/2012 13:09:28 ET T: 02/16/2012 13:52:21 ET JOB#: [id number]  cc: Gladstone Lighter, MD, <Dictator> Tracie Harrier, MD Deboraha Sprang, MD Minna Merritts, MD Gladstone Lighter MD ELECTRONICALLY SIGNED 02/16/2012 15:30

## 2014-09-28 NOTE — Consult Note (Signed)
General Aspect Kathryn Short is a 81 y.o. female with parxysmal atrial fibrillation and bradycardia with prior pacemaker implantation, recently seen by Dr. Virl Axe at [place], in NSR at that time, presenting with dizziness, worse over the past week or two, significant in past 24 hours with heart rates up to 150, atrial fib. Cardiology was consulted for atrial fib and dizziness.   BP at home has been stable. She did not make recent medication changes on 8/20 appt by Dr. Caryl Comes (decrease in diltiazem to 120 mg daily, increase in hyzaar). "If it is not broken then dron't try to fix it!" She has dizziness when standing, better with lying down. Some palpitations. No recent illnesses, or travel.   2010 ultrasonography at [facility] demonstrating normal left ventricular function with moderate MR   She has no history of coronary disease. She recalls having had a stress test at [facility]; we are unable to find records. cath in 2003.   She also describes a fall where she missed a step; this was in April. She hit her head.  no intracranial bleed by CT scan.   Past Medical History   ???  Atrial fibrillation     ???  Hypertension     ???  OSA (obstructive sleep apnea)         On BiPAP   ???  GERD (gastroesophageal reflux disease)     ???  Hypothyroidism         Treated   ???  Uterine cancer         s/p radiation therapy in 1952   ???  Melanoma         Resected from back       Past Surgical History   ???  Total abdominal hysterectomy w/ bilateral salpingoophorectomy     ???  Colostomy         For perforation   ???  Cholecystectomy     ???  Cytoscopy     ???  Insert / replace / remove pacemaker         Implantation-[employer]    Present Illness . Current Outpatient Prescriptions   Medication  Sig  Dispense  Refill   ???  acetaminophen (TYLENOL) 500 MG tablet  Take two tablets in the am         ???  b complex vitamins tablet  Take 1 tablet by mouth daily.         ???  B Complex-C CAPS  Take 1  capsule by mouth daily.           ???  Cyanocobalamin (VITAMIN B-12) 1000 MCG SUBL  Place 1,000 mcg under the tongue every 30 (thirty) days.           ???  diltiazem (CARTIA XT) 180 MG 24 hr capsule  Take 180 mg by mouth daily.         ???  docusate sodium (COLACE) 100 MG capsule  Take 100 mg by mouth 2 (two) times daily.         ???  HYDROcodone-acetaminophen (VICODIN) 5-500 MG per tablet  Take 1/2 tablet daily.         ???  levothyroxine (SYNTHROID, LEVOTHROID) 50 MCG tablet  Take 50 mcg by mouth daily.           ???  losartan-hydrochlorothiazide (HYZAAR) 50-12.5 MG per tablet  Take 1 tablet by mouth daily.           ???  omeprazole (PRILOSEC) 20  MG capsule  Take 20 mg by mouth daily.         ???  warfarin (COUMADIN) 5 MG tablet  Take 5 mg by mouth daily.         ???  sulfamethoxazole-trimethoprim (SMZ-TMP DS) 800-160 MG per tablet   SOCIAL HISTORY: She is widowed. She has five children. She lives alone but her daughter lives on one side and they are closely involved.   FAMILY HISTORY: No significant CAD hx   Physical Exam:   GEN well developed, well nourished, no acute distress    HEENT red conjunctivae    NECK supple  No masses    RESP normal resp effort  clear BS    CARD Irregular rate and rhythm  Tachycardic    ABD denies tenderness  soft    LYMPH negative neck    EXTR positive edema, trace    SKIN normal to palpation    NEURO motor/sensory function intact    PSYCH alert, A+O to time, place, person, good insight        Admit Reason:   AF (atrial fibrillation): (427.31) Active, ICD9, Atrial fibrillation  Lab Results:  Hepatic:  07-Sep-13 07:52    Bilirubin, Total 0.3   Alkaline Phosphatase 71   SGPT (ALT) 26   SGOT (AST) 22   Total Protein, Serum 7.6   Albumin, Serum 3.6  Routine Chem:  07-Sep-13 07:52    Result Comment TROPONIN - RESULTS VERIFIED BY REPEAT TESTING.  - CALLED RESULT TO DR.Corky Downs AT 1735  - 02/16/12-DAC  - READ-BACK PROCESS PERFORMED.   Result(s) reported on 16 Feb 2012 at 08:17AM.   Glucose, Serum  116   BUN 11   Creatinine (comp) 0.74   Sodium, Serum  133   Potassium, Serum 3.7   Chloride, Serum  97   CO2, Serum 27   Calcium (Total), Serum 8.9   Osmolality (calc) 267   eGFR (African American) >60   eGFR (Non-African American) >60 (eGFR values <36mL/min/1.73 m2 may be an indication of chronic kidney disease (CKD). Calculated eGFR is useful in patients with stable renal function. The eGFR calculation will not be reliable in acutely ill patients when serum creatinine is changing rapidly. It is not useful in  patients on dialysis. The eGFR calculation may not be applicable to patients at the low and high extremes of body sizes, pregnant women, and vegetarians.)   Anion Gap 9   Lipase 81 (Result(s) reported on 16 Feb 2012 at 09:05AM.)    10:46    Result Comment TROPONIN - RESULTS VERIFIED BY REPEAT TESTING. 2 - RESULT CALLED PREVIOUSLY AT 0906  - 02/16/12-DAC  Result(s) reported on 16 Feb 2012 at 11:31AM.  Cardiac:  07-Sep-13 07:52    Troponin I  0.09 (0.00-0.05 0.05 ng/mL or less: NEGATIVE  Repeat testing in 3-6 hrs  if clinically indicated. >0.05 ng/mL: POTENTIAL  MYOCARDIAL INJURY. Repeat  testing in 3-6 hrs if  clinically indicated. NOTE: An increase or decrease  of 30% or more on serial  testing suggests a  clinically important change)    10:46    Troponin I  0.07 (0.00-0.05 0.05 ng/mL or less: NEGATIVE  Repeat testing in 3-6 hrs  if clinically indicated. >0.05 ng/mL: POTENTIAL  MYOCARDIAL INJURY. Repeat  testing in 3-6 hrs if  clinically indicated. NOTE: An increase or decrease  of 30% or more on serial  testing suggests a  clinically important change)  Routine Coag:  07-Sep-13 07:52    Prothrombin  21.4   INR 1.8 (INR reference interval applies to patients on anticoagulant therapy. A single INR therapeutic range for coumarins is not optimal for all indications; however, the suggested  range for most indications is 2.0 - 3.0. Exceptions to the INR Reference Range may include: Prosthetic heart valves, acute myocardial infarction, prevention of myocardial infarction, and combinations of aspirin and anticoagulant. The need for a higher or lower target INR must be assessed individually. Reference: The Pharmacology and Management of the Vitamin K  antagonists: the seventh ACCP Conference on Antithrombotic and Thrombolytic Therapy. BJYNW.2956 Sept:126 (3suppl): [id number]. A HCT value >55% may artifactually increase the PT.  In one study,  the increase was an average of 25%. Reference:  "Effect on Routine and Special Coagulation Testing Values of Citrate Anticoagulant Adjustment in Patients with High HCT Values." American Journal of Clinical Pathology 2006;126:400-405.)  Routine Hem:  07-Sep-13 07:52    WBC (CBC) 10.3   RBC (CBC) 4.24   Hemoglobin (CBC) 12.6   Hematocrit (CBC) 37.0   Platelet Count (CBC) 347   MCV 87   MCH 29.6   MCHC 33.9   RDW 13.4   Neutrophil % 69.7   Lymphocyte % 22.6   Monocyte % 4.4   Eosinophil % 2.3   Basophil % 1.0   Neutrophil #  7.2   Lymphocyte # 2.3   Monocyte # 0.5   Eosinophil # 0.2   Basophil # 0.1 (Result(s) reported on 16 Feb 2012 at 08:14AM.)   EKG:   Interpretation EKG shows atrial fib with RVR, rate 119 to 140 bpm    Ace Inhibitors: Cough  ARB's: Cough  Latex: Rash    Impression PMH of  paroxysmal atrial fibrillation and bradycardia with prior pacemaker implantation, in NSR 01/29/12, presenting with dizziness, worse over the past week or two, significant in past 24 hours with heart rates up to 150, atrial fib. Significant dizziness.   1) Atrial fib: Evidence suggests this is contributing to dizziness Rate is rapid, worse with activity No significant improvement with diltiazem po and IV, on warfarin, INR 2.0 in August -Will start amiodarone infusion for rate control, possible conversion to NSR to relieve dizziness  sx. -Would continue diltiazem 180 pp Could use low dose metoprolol po if needed for rate control (25 mg po Q6 withy hold parameters) --Echo pending  2) Dizziness Head CT pending hx of falls Likely from atrial fib with RVR  3)OSA: Notes indicate she uses Bipap at night   Electronic Signatures: Ida Rogue (MD)  (Signed 07-Sep-13 13:17)  Authored: General Aspect/Present Illness, History and Physical Exam, Health Issues, Home Medications, Labs, EKG , Allergies, Impression/Plan   Last Updated: 07-Sep-13 13:17 by Ida Rogue (MD)

## 2014-09-28 NOTE — Discharge Summary (Signed)
PATIENT NAME:  Kathryn Short, Kathryn Short MR#:  [id number] DATE OF BIRTH:  February 14, 1922  DATE OF ADMISSION:  02/16/2012 DATE OF DISCHARGE:  02/18/2012  DISCHARGE DIAGNOSES:  1. Dizziness, secondary to atrial fibrillation with rapid ventricular rate.  2. Hypertension.  3. Hypothyroidism.  4. History of chronic urinary tract infection.  5. History of sick sinus syndrome status post pacemaker.   CHIEF COMPLAINT: Dizziness.   HISTORY OF PRESENT ILLNESS: Albertine Lafoy is an 81 year old female with history of hypertension, paroxysmal atrial fibrillation, sick sinus syndrome status post pacemaker placement, and hypothyroidism who presented to the hospital complaining of dizziness. The patient also has been complaining of generalized weakness for the last few weeks.  She reportedly had been seen by cardiologist Dr. Caryl Comes and had her pacemaker checked. The patient also was being checked by her daughter who noted that her heart rate has been running high and was ranging in the 130 to 140 range. She was especially dizzy when she stood up.   PAST MEDICAL HISTORY: Significant for hypertension, gastroesophageal reflux disease, paroxysmal atrial fibrillation, history of tachybrady syndrome status post pacemaker, history of osteoporosis, history of uterine cancer status post radiation therapy and surgery.   PAST SURGICAL HISTORY:  Total abdominal hysterectomy, bilateral salpingo-oophorectomy, history of colostomy done secondary to perforation of the colon from radiation treatment, history of cholecystectomy, appendectomy, cataract extraction, skin cancer extraction from nose.   PHYSICAL EXAMINATION:  Temperature 96.4, pulse 110, blood pressure 141/95, pulse oximetry 97% on room air, respirations 18. She was in mild distress. Pupils equal and reactive to light.  Neck was supple.  Lungs were clear to auscultation. Heart-  S1, S2 irregular rhythm.  Abdomen soft, nontender.  Extremities no edema.  Neurologically nonfocal.    LABORATORY DATA: WBC count 10.3, hemoglobin 12.6, hematocrit 37, platelets 347, sodium 133, potassium 3.7, chloride 93, bicarbonate 27, BUN 11, creatinine 0.74, glucose 116, calcium 8.9, ALT 26, AST 22, alkaline phosphatase 71, total bilirubin 0.3, INR 1.8, troponin 0.09, repeat troponin 0.07. Urinalysis showed nine WBCs, no bacteria, 1+ leukocyte esterase. EKG showed atrial fibrillation with RVR with heart rate 119. Chest x-ray showed mild hyperinflation. No evidence of congestive heart failure or pneumonia was seen.   HOSPITAL COURSE: The patient was admitted to [facility] and was also seen by the cardiologist, Dr. Rockey Situ. She was started on intravenous amiodarone and subsequently switched to p.o. amiodarone. She was continued on diltiazem 180 mg p.o. daily and also started on low-dose metoprolol 25 mg p.o. q. 6 and subsequently switched to q.12. The patient had an echocardiogram which showed atrial fibrillation, heart rate 120. Left ventricular systolic function was low normal with ejection fraction of 50- 55%. There was moderate concentric left ventricular hypertrophy. Pacemaker lead in the right ventricle. Right ventricular systolic function was normal. Left atrium was mildly dilated. There was mild to moderate tricuspid regurgitation and right ventricular systolic pressure estimated to be 40 mmHg consistent with mild pulmonary hypertension. There was mild to moderate aortic regurgitation. The patient symptomatically improved.  CT of the head was also done and did not show any acute intracranial abnormality. There were chronic age-related changes consistent with small vessel ischemia. During his stay in the hospital, the patient made good progress and amiodarone was switched to p.o. at 200 mg p.o. b.Short.d. Losartan/HCT was held because of adequate blood pressure control and risk of hypotension with the addition of metoprolol. She was stable at the time of discharge and discharged home on the following  medications.   DISCHARGE  MEDICATIONS: 1. Cartia XT 180 mg p.o. daily. 2. Synthroid 50 mcg daily. 3. Amiodarone 200 mg p.o. b.Short.d.  4. Metoprolol 25 mg p.o. b.Short.d. 5. Warfarin 3.5 mg  Monday through Friday, 4 mg Saturday and Sunday.  6. Multivitamin 1 tab once a day. 7. Hydrocodone 5/500, 1/2 tablet p.o. at bedtime.   FOLLOWUP: The patient is advised to follow up with me, Dr. Ginette Pitman, in 1 to 2 weeks. Also follow up with Dr. Rockey Situ, cardiologist at the [facility], in 1 to 2 weeks' time. Total time spent in coordinating care and discharging pt was 35 minutes  ____________________________ Tracie Harrier, MD [id number]:[id number] D:  02/18/2012 18:34:10 ET          T: 02/19/2012 14:09:39 ET          JOB#:  [id number] Tracie Harrier MD ELECTRONICALLY SIGNED 03/03/2012 13:04

## 2014-09-29 ENCOUNTER — Encounter: Payer: Self-pay | Admitting: Cardiovascular Disease

## 2014-09-29 ENCOUNTER — Ambulatory Visit (INDEPENDENT_AMBULATORY_CARE_PROVIDER_SITE_OTHER): Payer: Commercial Managed Care - HMO | Admitting: Cardiovascular Disease

## 2014-09-29 VITALS — BP 122/62 | HR 63 | Ht 62.0 in | Wt 155.5 lb

## 2014-09-29 DIAGNOSIS — I4891 Unspecified atrial fibrillation: Secondary | ICD-10-CM | POA: Diagnosis not present

## 2014-09-29 DIAGNOSIS — I10 Essential (primary) hypertension: Secondary | ICD-10-CM

## 2014-09-29 DIAGNOSIS — R609 Edema, unspecified: Secondary | ICD-10-CM

## 2014-09-29 DIAGNOSIS — I5033 Acute on chronic diastolic (congestive) heart failure: Secondary | ICD-10-CM

## 2014-09-29 DIAGNOSIS — Z950 Presence of cardiac pacemaker: Secondary | ICD-10-CM

## 2014-09-29 DIAGNOSIS — R0602 Shortness of breath: Secondary | ICD-10-CM

## 2014-09-29 NOTE — Assessment & Plan Note (Signed)
Blood pressure is well controlled on today's visit. No changes made to the medications. 

## 2014-09-29 NOTE — Assessment & Plan Note (Signed)
I suspect some of her edema is secondary to venous insufficiency. recommended compression hose, leg elevation, movement

## 2014-09-29 NOTE — Progress Notes (Signed)
Patient ID: Kathryn Short, female    DOB: 10/02/1921, 81 y.o.   MRN: [id number]  HPI Comments:  Kathryn Short is an 81 year old woman with paroxysmal atrial fibrillation (last episode in March 9147), diastolic CHF on a low-dose diuretic daily, labile hypertension,  sick sinus syndrome, pacemaker placement followed by Dr. Caryl Comes,  who presented to the hospital [facility] on 02/16/2012 in atrial fibrillation. She presents today for routine followup for her blood pressure and weight gain him a leg edema. Her past surgical history includes open cholecystectomy, partial colon resection and colostomy, appendectomy, hysterectomy, oophorectomy, GYN malignancy treated with radiation in the 1950s Recent bowel obstruction December 2015 She presents for routine follow-up of her blood pressure.  In follow up today, blood pressure typically 140s. On one occasion blood pressure hit 829 systolic in the morning, she took sublingual nitroglycerin with improvement of her systolic pressure down to 150. No further episodes Weight is up 3.5 pounds since her last clinic visit. She has been eating out more as they have been repainting their house She has been taking full torsemide alternating with half dose torsemide daily Some swelling in her legs, worse on the left than the right. Not wearing compression hose today Has been sitting more with her legs down She is taking lactulose daily with no significant constipation No recent falls  EKG on today's visit shows normal sinus rhythm with rate 64 bpm, no significant ST or T-wave changes  Other past medical history  in the hospital 05/25/2014, with small bowel obstruction. She was placed on bowel rest, IV fluids and  she passed her bowels. She did not require surgery  Echocardiogram done in the hospital 05/25/2014 showing ejection fraction greater than 55%, moderate pulmonary hypertension, mild to moderate TR  Earlier in 2015, she had atrial tachycardia and pacer was tracking  the atrial rhythm,  Her device was reprogrammed in the office for DDI  She was continued on her beta blocker and amiodarone and rhythm broke, converting back to normal sinus rhythm. Pacemaker has been since changed back to her prior settings   She is reluctant to use a walker, still uses a cane. Anticoagulation  discussed previouslywith the patient and her daughter. They are not interested. They preferred to stay on aspirin  history of falls with trauma requiring stitches. None recently Patient and daughter are afraid of her fall risk, other complications from anticoagulation. Currently not on anticoagulation at their request. They are aware of risks and benefits associated with arrhythmia and stroke.  Previous lab work; Total cholesterol 189, LDL 104, hematocrit 38, creatinine 0.8  previous visits to the  emergency room with chest pain, dizziness, malaise. She had a problem on isosorbide in the past. Feelings of insomnia, and anxious feeling, chest pain episodes at night that wake her from sleep, weakness, nausea, dizziness. She has obstructive sleep apnea and does not use her BiPAP at nighttime  Echocardiogram in the hospital showed ejection fraction 50-55%, moderate LVH, mild to moderate TR, right ventricular systolic pressure 40 mm of mercury, mild to moderate aortic valve insufficiency   Allergies  Allergen Reactions  . Ace Inhibitors   . Ambien [Zolpidem Tartrate]   . Bactrim [Sulfamethoxazole-Trimethoprim]   . Digoxin And Related   . Hctz [Hydrochlorothiazide]   . Lunesta [Eszopiclone]     Outpatient Encounter Prescriptions as of 09/29/2014  Medication Sig  . acetaminophen (TYLENOL) 500 MG tablet Takes 2-4 tablets daily.  Marland Kitchen amiodarone (PACERONE) 200 MG tablet Take 0.5 tablets (100 mg  total) by mouth daily.  Marland Kitchen aspirin 81 MG tablet Take 162 mg by mouth daily.  Marland Kitchen b complex vitamins tablet Take 1 tablet by mouth daily.  . cloNIDine (CATAPRES) 0.1 MG tablet Take 1 tablet (0.1 mg  total) by mouth 3 (three) times daily as needed.  . dicyclomine (BENTYL) 10 MG capsule Take 10 mg by mouth daily. As needed  . guaiFENesin (MUCINEX) 600 MG 12 hr tablet Take 1 tablet (600 mg total) by mouth 2 (two) times daily as needed.  . lactulose (CHRONULAC) 10 GM/15ML solution Take 30 g by mouth daily.   Marland Kitchen levothyroxine (SYNTHROID, LEVOTHROID) 50 MCG tablet Take 50 mcg by mouth daily.   Marland Kitchen losartan (COZAAR) 100 MG tablet Take 1 tablet (100 mg total) by mouth daily.  . metoprolol (LOPRESSOR) 50 MG tablet Take 1 tablet (50 mg total) by mouth 2 (two) times daily.  . Multiple Vitamin (MULTIVITAMIN) tablet Take 1 tablet by mouth daily.  . nitrofurantoin, macrocrystal-monohydrate, (MACROBID) 100 MG capsule Take 100 mg by mouth 2 (two) times daily as needed.   . nitroGLYCERIN (NITROSTAT) 0.4 MG SL tablet Place 1 tablet (0.4 mg total) under the tongue every 5 (five) minutes as needed for chest pain.  . potassium chloride (K-DUR) 10 MEQ tablet Take 1 tablet alternate with 1/2 tablet.  . Probiotic Product (PROBIOTIC DAILY PO) Take by mouth daily.  Marland Kitchen torsemide (DEMADEX) 20 MG tablet Take one tablet alternate with half tablet daily.    Past Medical History  Diagnosis Date  . Atrial fibrillation   . Hypertension   . OSA (obstructive sleep apnea)     On BiPAP  . GERD (gastroesophageal reflux disease)   . Hypothyroidism     Treated  . Uterine cancer     s/p radiation therapy in 1952  . Melanoma     Resected from back  . Fall   . Bowel obstruction     Past Surgical History  Procedure Laterality Date  . Total abdominal hysterectomy w/ bilateral salpingoophorectomy    . Colostomy      For perforation  . Cholecystectomy    . Cytoscopy    . Insert / replace / remove pacemaker      Implantation-[employer]  . Skin biopsy      face    Social History  reports that she has never smoked. She does not have any smokeless tobacco history on file. She reports that she does not drink alcohol or use  illicit drugs.  Family History Family history is unknown by patient.  Review of Systems  Respiratory: Negative.   Cardiovascular: Positive for leg swelling.  Gastrointestinal: Negative.   Musculoskeletal: Positive for gait problem.  Neurological: Negative.   All other systems reviewed and are negative.   BP 122/62 mmHg  Pulse 63  Ht 5\' 2"  (1.575 m)  Wt 155 lb 8 oz (70.534 kg)  BMI 28.43 kg/m2  Physical Exam  Constitutional: She is oriented to person, place, and time. She appears well-developed and well-nourished.  HENT:  Head: Normocephalic.  Nose: Nose normal.  Mouth/Throat: Oropharynx is clear and moist.  Eyes: Conjunctivae are normal. Pupils are equal, round, and reactive to light.  Neck: Normal range of motion. Neck supple. No JVD present.  Cardiovascular: Normal rate, regular rhythm, S1 normal, S2 normal, normal heart sounds and intact distal pulses.  Exam reveals no gallop and no friction rub.   No murmur heard. Trace pitting edema to the mid shins  Pulmonary/Chest: Effort normal and breath  sounds normal. No respiratory distress. She has no wheezes. She has no rales. She exhibits no tenderness.  Abdominal: Soft. Bowel sounds are normal. She exhibits no distension. There is no tenderness.  Musculoskeletal: Normal range of motion. She exhibits edema. She exhibits no tenderness.  Lymphadenopathy:    She has no cervical adenopathy.  Neurological: She is alert and oriented to person, place, and time. Coordination normal.  Skin: Skin is warm and dry. No rash noted. No erythema.  Psychiatric: She has a normal mood and affect. Her behavior is normal. Judgment and thought content normal.    Assessment and Plan  Nursing note and vitals reviewed.

## 2014-09-29 NOTE — Assessment & Plan Note (Signed)
Appears to be maintaining normal sinus rhythm on her current regimen. We'll continue low-dose amiodarone, metoprolol She's not on anticoagulation. Previous discussion with the family, they did not want anticoagulation

## 2014-09-29 NOTE — Assessment & Plan Note (Signed)
Followed by Dr. Klein 

## 2014-09-29 NOTE — Patient Instructions (Signed)
You are doing well.  Weight is up 3 1/2 pounds, Please take torsemide every day for a few days  Then back to alternating full dose with 1/2 dose  Please call us if you have new issues that need to be addressed before your next appt.  Your physician wants you to follow-up in: 1 month.

## 2014-09-29 NOTE — Assessment & Plan Note (Signed)
Encouraged her to take torsemide daily for dose for 3-4 days in a row, then back to alternating full dose with half dose Weight is up 3.5 pounds from her prior clinic visit Daughter will start to monitor her weight again at home

## 2014-10-02 NOTE — Discharge Summary (Signed)
PATIENT NAME:  Kathryn Short, Kathryn Short I MR#:  [id number] DATE OF BIRTH:  07-09-1921  DATE OF ADMISSION:  05/25/2014 DATE OF DISCHARGE:  05/29/2014  PRINCIPAL DIAGNOSIS: Radiation enteritis.   OTHER DIAGNOSES: 1. Enterococcus urinary tract infection.  2. Congestive heart failure.  3. Atrial fibrillation.  4. Sleep apnea.  5. Hypothyroidism.  6. Hypertension.  7. Gastroesophageal reflux disease.  8. Status post pacemaker.  9. History of multiple urinary tract infections.  10. Status post hysterectomy and oophorectomy in 1952 with postoperative external beam radiation therapy.  11. Status post open cholecystectomy.  12. Status post partial colectomy with colostomy and subsequent colostomy takedown.   HOSPITAL COURSE: The patient was admitted to the hospital with nasogastric suction and she stated that her mind "comes and goes," but she was quite mentally competent throughout her hospitalization with the exception of 1 day of disorientation. She relied on her daughter to make decisions about her healthcare and her daughter was her Press photographer. She was opposed to major surgery and said that she had had a good long life and was prepared for death if her small bowel obstruction did not improve with nonoperative measures. I concurred with her decision, considering her age, her multiple comorbidities and her multiple abdominal surgeries as well as her history of radiation since the only surgery that I thought might relieve her obstruction would be an ileostomy, which would be quite morbid.   On the sixteenth, she passed flatus and had a small bowel moment. Her x-ray was improved and her nasogastric output was low and clear. The seventeenth, she had had 2 loose watery brown bowel movements and passed more flatus and her urinalysis returned with 1500 white blood cells per high-power field. Upon further questioning, she had urinary frequency and an in-and-out catheterization was obtained for urine  culture and then she was placed on ciprofloxacin. She was given unlimited clear liquids that day and the following day, started on a full liquid diet. She was instructed by the dietitian on a low-fiber diet. She tolerated her diet well and had at least 4 more bowel movements that were watery, mixed with some solid stool.   X-ray showed that the majority of her gas was in the colon with some nondilated small bowel and some solid stool in the colon as well. The day of discharge, her urinary tract infection organism was speciated as enterococcus and was sensitive to the ciprofloxacin that she was on, but was also sensitive to ampicillin and since Cipro interacted with her amiodarone, I switched her to amoxicillin for 2 weeks upon discharge. It was clear that she did not require any future admissions to the surgical service, since she was essentially a nonsurgical patient. She had been started on lactulose 30 mL twice a day and her daughter was instructed to decrease this if in fact she continued to have diarrhea. Discharge medications were documented on her chart and she plans to seek follow up with her primary care physician within a few weeks.    ____________________________ Consuela Mimes, MD wfm:TT D: 06/07/2014 20:42:39 ET T: 06/07/2014 21:56:48 ET JOB#: [id number]  cc: Consuela Mimes, MD, <Dictator> Consuela Mimes MD ELECTRONICALLY SIGNED 06/09/2014 19:53

## 2014-10-02 NOTE — Consult Note (Signed)
General Aspect Kathryn Short is an 81 year old woman with paroxysmal atrial fibrillation (last episode in March 8416), diastolic CHF on a low-dose diuretic 4 days per week, labile hypertension,?? sick sinus syndrome, pacemaker placement followed by Dr. Caryl Comes,?? hx of colestomy, who presented to the hospital [facility] on 02/16/2012 in atrial fibrillation, presenting yesterday with ABD pain and chest pain.  She reports periodic ABD pain that typically resolves with bowel rest. yesterday, pain persisted, lower ABD. Also with fullness below her ribs, zyphoid area. She took and NTG and came to the ER.  In the ED, CT scan showing small bowel obstruction, adhesions.  Troponin peak so far 0.11  As an outpt recently with daiastolic CHF sx. We have been slowly increasing her torsemide dosing for weight gain, leg edema.  She is currently taking one half clonidine in the morning, at 5 PM, full clonidine pill at 10 PM,?? with losartan 100 mg at bedtime, sometimes extra half clonidine before bed for high pressure   Other past medical history Earlier in 2015, she had atrial tachycardia and pacer was tracking the atrial rhythm,?? Her device was reprogrammed in the office for DDI ?? She was continued on her beta blocker and amiodarone and rhythm broke, converting back to normal sinus rhythm. Pacemaker has been since changed back to her prior settings  ??She is reluctant to use a walker, still uses a cane. Anticoagulation?? discussed previouslywith the patient and her daughter. They are not interested. They preferred to stay on aspirin  history of falls with trauma requiring stitches. None recently Patient and daughter are afraid of her fall risk, other complications from anticoagulation. Currently not on anticoagulation at their request. They are aware of risks and benefits associated with arrhythmia and stroke.  Previous lab work; Total cholesterol 189, LDL 104, hematocrit 38, creatinine 0.8  previous visits to  the?? emergency room with chest pain, dizziness, malaise. She had a problem on isosorbide in the past. Feelings of insomnia, and anxious feeling, chest pain episodes at night that wake her from sleep, weakness, nausea, dizziness. She has obstructive sleep apnea and does not use her BiPAP at nighttime  Previous echo 2013: ejection fraction 50-55%, moderate LVH, mild to moderate TR, right ventricular systolic pressure 40 mm of mercury, mild to moderate aortic valve insufficiency   Physical Exam:  GEN well developed, well nourished, no acute distress   HEENT hearing intact to voice, moist oral mucosa   NECK supple   RESP normal resp effort  clear BS   CARD Regular rate and rhythm  No murmur   ABD denies tenderness  firm, mildly tender   LYMPH negative neck   EXTR negative edema   SKIN normal to palpation   NEURO motor/sensory function intact   PSYCH alert, A+O to time, place, person, good insight   Review of Systems:  Subjective/Chief Complaint ABD pain, chest fullness   General: Weakness   Skin: No Complaints   ENT: No Complaints   Eyes: No Complaints   Neck: No Complaints   Respiratory: No Complaints   Cardiovascular: Chest pain or discomfort   Gastrointestinal: Constipation  pain   Genitourinary: No Complaints   Vascular: No Complaints   Musculoskeletal: No Complaints   Neurologic: No Complaints   Hematologic: No Complaints   Endocrine: No Complaints   Psychiatric: No Complaints   Review of Systems: All other systems were reviewed and found to be negative   Medications/Allergies Reviewed Medications/Allergies reviewed   Family & Social History:  Family and  Social History:  Family History Negative   Social History negative tobacco   + Tobacco Current (within 1 year)   Place of Living Home     GERD - Esophageal Reflux:    CHF:    Uterine Cancer:    Sleep Apnea with CPAP:    Hard of Hearing/ Bilateral Hearing Aids:     Hypothyroidism:    HTN:    Atrial Fibrillation:    Cataract Extraction:    Colostomy reversal:    pacemaker:    Hysterectomy - Total:    Cholecystectomy:    Appendectomy:    Colon Resection:        Admit Diagnosis:   SMALL BOWEL OBSTRUCTION: Onset Date: 25-May-2014, Status: Active, Description: SMALL BOWEL OBSTRUCTION  Home Medications: Medication Instructions Status  acetaminophen-hydrocodone 500 mg-5 mg oral tablet 0.5 tab(s) orally once a day (at bedtime) Active  Super B Complex oral tablet 1 tab(s) orally once a day (in the morning) Active  multivitamin 1 tab(s) orally once a day (in the morning) Active  Probiotic Formula oral capsule 1 cap(s) orally once a day (in the evening) Active  acetaminophen 500 mg oral tablet 2 tab(s) orally 1 to 2 times a day, As Needed - for Pain Active  amiodarone 200 mg oral tablet 0.5 tab(s) orally once a day Active  aspirin 81 mg oral tablet 2 tab(s) orally once a day Active  cloNIDine 0.1 mg oral tablet 1 tab(s) orally 3 times a day, As Needed for high blood pressure Active  dicyclomine 10 mg oral capsule 1 cap(s) orally once a day, As Needed - for Diarrhea Active  docusate sodium sodium 100 mg oral capsule 1 cap(s) orally 2 times a day Active  guaiFENesin 600 mg oral tablet, extended release 1 tab(s) orally 2 times a day, As Needed for cough Active  levothyroxine 50 mcg (0.05 mg) oral tablet 1 tab(s) orally once a day Active  losartan 100 mg oral tablet 1 tab(s) orally once a day (at bedtime) Active  Macrobid macrocrystals-monohydrate 100 mg oral capsule 1 cap(s) orally 2 times a day, As Needed for infection Active  metoprolol tartrate 50 mg oral tablet 1 tab(s) orally 2 times a day Active  nitroglycerin 0.4 mg sublingual tablet 1 tab(s) sublingual every 5 minutes, As Needed - for Chest Pain  Active  potassium chloride 10 mEq oral tablet, extended release 1 tab(s) orally 4 times a week, As Needed for fluid build-up when taking torsemide  Active  torsemide 20 mg oral tablet 0.5 tab(s) orally 4 times a week, As Needed for fluid build-up Active   Lab Results:  Routine Chem:  15-Dec-15 00:53   Result Comment TROPONIN - RESULTS VERIFIED BY REPEAT TESTING.  - C/ JULIE MENDOZA $[email or web address]$  05-25-14.Marland KitchenAJO  - READ-BACK PROCESS PERFORMED.  Result(s) reported on 25 May 2014 at 01:53AM.  Glucose, Serum  159  BUN 17  Creatinine (comp) 1.08  Sodium, Serum  135  Potassium, Serum 4.7  Chloride, Serum  97  CO2, Serum 29  Calcium (Total), Serum 9.1  Anion Gap 9  Osmolality (calc) 275  eGFR (African American) >60  eGFR (Non-African American)  51 (eGFR values <46mL/min/1.73 m2 may be an indication of chronic kidney disease (CKD). Calculated eGFR, using the MRDR Study equation, is useful in  patients with stable renal function. The eGFR calculation will not be reliable in acutely ill patients when serum creatinine is changing rapidly. It is not useful in patients on dialysis. The eGFR calculation may not be  applicable to patients at the low and high extremes of body sizes, pregnant women, and vegetarians.)  B-Type Natriuretic Peptide ([facility])  1178 (Result(s) reported on 25 May 2014 at 01:48AM.)  Cardiac:  15-Dec-15 00:53   Troponin I  0.08 (0.00-0.05 0.05 ng/mL or less: NEGATIVE  Repeat testing in 3-6 hrs  if clinically indicated. >0.05 ng/mL: POTENTIAL  MYOCARDIAL INJURY. Repeat  testing in 3-6 hrs if  clinically indicated. NOTE: An increase or decrease  of 30% or more on serial  testing suggests a  clinically important change)    04:45   Troponin I  0.11 (0.00-0.05 0.05 ng/mL or less: NEGATIVE  Repeat testing in 3-6 hrs  if clinically indicated. >0.05 ng/mL: POTENTIAL  MYOCARDIAL INJURY. Repeat  testing in 3-6 hrs if  clinically indicated. NOTE: An increase or decrease  of 30% or more on serial  testing suggests a  clinically important change)  Routine Coag:  15-Dec-15 00:53   Prothrombin 12.7  INR 1.0 (INR reference  interval applies to patients on anticoagulant therapy. A single INR therapeutic range for coumarins is not optimal for all indications; however, the suggested range for most indications is 2.0 - 3.0. Exceptions to the INR Reference Range may include: Prosthetic heart valves, acute myocardial infarction, prevention of myocardial infarction, and combinations of aspirin and anticoagulant. The need for a higher or lower target INR must be assessed individually. Reference: The Pharmacology and Management of the Vitamin K  antagonists: the seventh ACCP Conference on Antithrombotic and Thrombolytic Therapy. YIFOY.7741 Sept:126 (3suppl): [id number]. A HCT value >55% may artifactually increase the PT.  In one study,  the increase was an average of 25%. Reference:  "Effect on Routine and Special Coagulation Testing Values of Citrate Anticoagulant Adjustment in Patients with High HCT Values." American Journal of Clinical Pathology 2006;126:400-405.)  Routine Hem:  15-Dec-15 00:53   WBC (CBC)  12.2  RBC (CBC) 4.51  Hemoglobin (CBC) 13.0  Hematocrit (CBC) 40.6  Platelet Count (CBC) 233 (Result(s) reported on 25 May 2014 at 01:18AM.)  MCV 90  MCH 28.8  MCHC  31.9  RDW  15.4   EKG:  Interpretation EKG shows normal sinus rhythm with rate 61 bpm, first-degree AV block   Radiology Results: XRay:    15-Dec-15 00:45, Chest PA and Lateral  Chest PA and Lateral   REASON FOR EXAM:    sob  COMMENTS:       PROCEDURE: DXR - DXR CHEST PA (OR AP) AND LATERAL  - May 25 2014 12:45AM     CLINICAL DATA:  Initial valuation for shortness of breath.    EXAM:  CHEST  2 VIEW    COMPARISON:  Prior radiograph from 09/25/2012    FINDINGS:  Left-sided pacemaker/AICD with electrodes overlying the right antrum  ventricle is stable. Cardiomegaly is unchanged. Mild tortuosity of  the intrathoracic aorta noted.    Lungs are mildly hypoinflated with associated bibasilar  bronchovascular crowding/ atelectasis.  No focal infiltrate,  pulmonary edema, or pleural effusion. No pneumothorax.    No acute osseus abnormality.     IMPRESSION:  Shallow lung inflation with mild bibasilar atelectasis/  bronchovascular crowding. No other acute cardiopulmonary  abnormality.      Electronically Signed    By: Jeannine Boga M.D.    On: 05/25/2014 02:40         Verified By: Neomia Glass, M.D.,  CT:    15-Dec-15 03:21, CT Abdomen and Pelvis With Contrast  CT Abdomen and Pelvis With Contrast  REASON FOR EXAM:    (1) LLQ pain vomiting; (2) LLQ pain vomiting  COMMENTS:       PROCEDURE: CT  - CT ABDOMEN / PELVIS  W  - May 25 2014  3:21AM     CLINICAL DATA:  Left lower quadrant pain and vomiting.    EXAM:  CT ABDOMEN AND PELVIS WITH CONTRAST    TECHNIQUE:  Multidetector CT imaging of the abdomen and pelvis was performed  using the standard protocol following bolus administration of  intravenous contrast.  CONTRAST:  Dose unknown in down time setting, please reference the  electronic medical record.    COMPARISON:  This study was dictated in a down time without  comparisons available.    FINDINGS:  BODY WALL: Unremarkable.    LOWER CHEST: Cardiomegaly. There is partial visualization of  dual-chamber pacer leads in the right heart.    Trace right pleural effusion.    ABDOMEN/PELVIS:  Liver: Mild lobulation of the liver surface. The caudate lobe is  also prominent in size. No focal abnormality.    Biliary: Cholecystectomy.    Pancreas: Unremarkable.    Spleen: Unremarkable.    Adrenals: Unremarkable.    Kidneys and ureters: No hydronephrosis or stone.    Bladder: Circumferential bladder wall thickening. There may be early  cellule formation which could be from chronic outlet obstruction in  this patient with evidence of pelvic floor laxity.    Reproductive: Hysterectomy. Probable oophorectomies.    Bowel: Dilated fluid-filled bowel in the ventral abdomen,  with  fecalized contents proximal to and abrupt transition point in the  right lower quadrant. Bowel in this region is angulated. The distal  bowel is decompressed. There is no evidence for pneumatosis or  perforation. Small volume pelvic ascites, likely reactive. Status  post right hemicolectomy and resection at the descending sigmoid  junction. The anastomoses are patent.    Retroperitoneum: No mass or adenopathy.    Peritoneum: No ascites or pneumoperitoneum.  Vascular: No acute abnormality.    OSSEOUS: No acute abnormalities.  Marked osteopenia.    These results were called by telephone at the time of interpretation  on 05/25/2014 at 5:00 am to Dr. Marjean Donna , who verbally  acknowledged these results.     IMPRESSION:  1. High-grade distal small-bowel obstruction, likely right lower  quadrant adhesion.  2. Right and left colon resection with patent anastomoses.  3. Bladder wall thickening. Suggest urinalysis to evaluate for  cystitis.  4. Liver morphology suggesting cirrhosis.  Electronically Signed    By: Jorje Guild M.D.    On: 05/25/2014 05:03         Verified By: Gilford Silvius, M.D.,    Ace Inhibitors: Cough  ARB's: Cough  Latex: Rash  Ambien: Unknown  Bactrim: Unknown  Hydrochlorothiazide: Unknown  Digoxin: Unknown  Lunesta: Unknown  Vital Signs/Nurse's Notes: **Vital Signs.:   15-Dec-15 08:00  Vital Signs Type Admission  Temperature Temperature (F) 97.9  Celsius 36.6  Temperature Source oral  Pulse Pulse 64  Respirations Respirations 19  Systolic BP Systolic BP 700  Diastolic BP (mmHg) Diastolic BP (mmHg) 78  Mean BP 98  Pulse Ox % Pulse Ox % 95  Pulse Ox Activity Level  At rest  Oxygen Delivery Room Air/ 21 %  Telemetry pattern Cardiac Rhythm Paced rhythm    Impression Kathryn Short is an 81 year old woman with paroxysmal atrial fibrillation (last episode in March 1749), diastolic CHF on a low-dose diuretic 4 days per week, labile  hypertension,?? sick sinus syndrome, pacemaker placement followed by Dr. Caryl Comes,?? hx of colestomy, who presented to the hospital [facility] on 02/16/2012 in atrial fibrillation, presenting yesterday with ABD pain and chest pain.  1) Small bowel obstruction: previous colectomy, adhesions, ABD pain may need surgery per medical team. She is "feeling" better today. --would proceed with IVF cautiously given hx of diastolic CHF Higher risk given her age, no known CAD, previously with normal EF. minimal troponin elevation in setting of severe HTN on arrival (likely demand ischemia) no stress testing or intervention planned, echo order has been placed to confirm normal EF Will talk with family.  2) Acute on chronic diastolic CHF ideal weight in the mid 140s Will need to go slow with IVF  3) Edema  Edema consistent with acute on chronic diastolic CHF.  4) Essential hypertension  Was taking one half clonidine in the morning, at 5 PM, full clonidine pill at 10 PM,?? with losartan 100 mg at bedtime, sometimes extra half clonidine before bed for high pressure --now not taking anything po NTG paste for now, clonidine patch if needed  5) PACEMAKER, PERMANENT  Followed by Dr. Virl Axe, EP  6) ATRIAL FIBRILLATION -  On amiodarone 100 mg po daily at home Appears to maintaining normal sinus rhythm Would continue amiodarone po 200 mg po bid as currently written, for now  7) Falls frequently  No recent falls. Walks with assistance, a cane, does not want a walker   Electronic Signatures: Ida Rogue (MD)  (Signed 15-Dec-15 09:59)  Authored: General Aspect/Present Illness, History and Physical Exam, Review of System, Family & Social History, Past Medical History, Health Issues, Home Medications, Labs, EKG , Radiology, Allergies, Vital Signs/Nurse's Notes, Impression/Plan   Last Updated: 15-Dec-15 09:59 by Ida Rogue (MD)

## 2014-10-02 NOTE — H&P (Signed)
Subjective/Chief Complaint abdominal pain and vomiting 2 days   History of Present Illness 81 y/o female with complex past surgical history presents with 2-3 day hx of progressive abdominal distention, then diffuse L > R sided abdominal pain followed by emesis starting Monday evening around 7 pm.  Last BM was Sunday, unsure when last apssage of flatus was.  No fevers. Daughter describes about a 2 years hx of intermittent "Bowel Issues" characterized by intermittent abdominal pain, distention followed by emesis.    Past surgical Hx significant for open chole, partial colon resection including colostomy, appendectomy, hysterestomt and oophorectomy age 88 for GYN malignancy treated with radiation (58's).   Past History atrial fibrillation pacemaker hypertension hypothyrodism. Cardiac disease, CHF (Dr Rockey Situ). hx of UTI's   Primary Physician Dr Ginette Pitman, [facility].   Code Status Full Code   Past Med/Surgical Hx:  GERD - Esophageal Reflux:   CHF:   Uterine Cancer:   Sleep Apnea with CPAP:   Hard of Hearing/ Bilateral Hearing Aids:   Hypothyroidism:   HTN:   Atrial Fibrillation:   Cataract Extraction:   Colostomy reversal:   pacemaker:   Hysterectomy - Total:   Cholecystectomy:   Appendectomy:   Colon Resection:   ALLERGIES:  Ace Inhibitors: Cough  ARB's: Cough  Latex: Rash  Ambien: Unknown  Bactrim: Unknown  Hydrochlorothiazide: Unknown  Digoxin: Unknown  Lunesta: Unknown  Family and Social History:  Family History Non-Contributory   Social History negative tobacco, negative ETOH   Place of Living Home  lives with daughter.   Review of Systems:  Subjective/Chief Complaint see HPI.   Physical Exam:  GEN no acute distress, disheveled   HEENT PERRL, dry oral mucosa, limited hearing   NECK supple   RESP normal resp effort  clear BS   CARD regular rate   ABD positive tenderness  no liver/spleen enlargement  no hernia  lower midline scar, ruq  transverse scar   LYMPH negative neck   EXTR negative cyanosis/clubbing, negative edema   SKIN normal to palpation, No rashes   NEURO cranial nerves intact   PSYCH A+O to time, place, person, good insight   Radiology Results: XRay:    15-Dec-15 00:45, Chest PA and Lateral  Chest PA and Lateral  REASON FOR EXAM:    sob  COMMENTS:       PROCEDURE: DXR - DXR CHEST PA (OR AP) AND LATERAL  - May 25 2014 12:45AM     CLINICAL DATA:  Initial valuation for shortness of breath.    EXAM:  CHEST  2 VIEW    COMPARISON:  Prior radiograph from 09/25/2012    FINDINGS:  Left-sided pacemaker/AICD with electrodes overlying the right antrum  ventricle is stable. Cardiomegaly is unchanged. Mild tortuosity of  the intrathoracic aorta noted.    Lungs are mildly hypoinflated with associated bibasilar  bronchovascular crowding/ atelectasis. No focal infiltrate,  pulmonary edema, or pleural effusion. No pneumothorax.    No acute osseus abnormality.     IMPRESSION:  Shallow lung inflation with mild bibasilar atelectasis/  bronchovascular crowding. No other acute cardiopulmonary  abnormality.      Electronically Signed    By: Jeannine Boga M.D.    On: 05/25/2014 02:40         Verified By: Neomia Glass, M.D.,  [place]:  PACS Image    15-Dec-15 03:21, CT Abdomen and Pelvis With Contrast  PACS Image  CT:  CT Abdomen and Pelvis With Contrast  REASON FOR EXAM:    (  1) LLQ pain vomiting; (2) LLQ pain vomiting  COMMENTS:       PROCEDURE: CT  - CT ABDOMEN / PELVIS  W  - May 25 2014  3:21AM     CLINICAL DATA:  Left lower quadrant pain and vomiting.    EXAM:  CT ABDOMEN AND PELVIS WITH CONTRAST    TECHNIQUE:  Multidetector CT imaging of the abdomen and pelvis was performed  using the standard protocol following bolus administration of  intravenous contrast.  CONTRAST:  Dose unknown in down time setting, please reference the  electronic medical  record.    COMPARISON:  This study was dictated in a down time without  comparisons available.    FINDINGS:  BODY WALL: Unremarkable.    LOWER CHEST: Cardiomegaly. There is partial visualization of  dual-chamber pacer leads in the right heart.    Trace right pleural effusion.    ABDOMEN/PELVIS:  Liver: Mild lobulation of the liver surface. The caudate lobe is  also prominent in size. No focal abnormality.    Biliary: Cholecystectomy.    Pancreas: Unremarkable.    Spleen: Unremarkable.    Adrenals: Unremarkable.    Kidneys and ureters: No hydronephrosis or stone.    Bladder: Circumferential bladder wall thickening. There may be early  cellule formation which could be from chronic outlet obstruction in  this patient with evidence of pelvic floor laxity.    Reproductive: Hysterectomy. Probable oophorectomies.    Bowel: Dilated fluid-filled bowel in the ventral abdomen, with  fecalized contents proximal to and abrupt transition point in the  right lower quadrant. Bowel in this region is angulated. The distal  bowel is decompressed. There is no evidence for pneumatosis or  perforation. Small volume pelvic ascites, likely reactive. Status  post right hemicolectomy and resection at the descending sigmoid  junction. The anastomoses are patent.    Retroperitoneum: No mass or adenopathy.    Peritoneum: No ascites or pneumoperitoneum.  Vascular: No acute abnormality.    OSSEOUS: No acute abnormalities.  Marked osteopenia.    These results were called by telephone at the time of interpretation  on 05/25/2014 at 5:00 am to Dr. Marjean Donna , who verbally  acknowledged these results.     IMPRESSION:  1. High-grade distal small-bowel obstruction, likely right lower  quadrant adhesion.  2. Right and left colon resection with patent anastomoses.  3. Bladder wall thickening. Suggest urinalysis to evaluate for  cystitis.  4. Liver morphology suggesting  cirrhosis.  Electronically Signed    By: Jorje Guild M.D.    On: 05/25/2014 05:03         Verified By: Gilford Silvius, M.D.,    Assessment/Admission Diagnosis 81 y/o with SBO- adhesive most likely, radiation enteritis less likely. Appearance of CT scan suggests acute on chronic process in small bowel to account for current and prior episodes. elevated troponin without chest pain or SOB, EKG without ischemic changes hypertension AF- well controlled. Hx of prior radiation to pelvis for presumed GYN malignancy at early age.   Plan Admit, NPO, NGT, medicine consult. discussed with her and daughter possible surgical intervention if NGT and hydration does not alleviate problem.  Will discuss and allow Dr Leanora Cover to assume care later this am.  total time spent 60 minutes   Electronic Signatures: Sherri Rad (MD)  (Signed 15-Dec-15 07:14)  Authored: CHIEF COMPLAINT and HISTORY, PAST MEDICAL/SURGIAL HISTORY, ALLERGIES, FAMILY AND SOCIAL HISTORY, REVIEW OF SYSTEMS, PHYSICAL EXAM, Radiology, ASSESSMENT AND PLAN   Last Updated: 15-Dec-15  07:14 by Sherri Rad (MD)

## 2014-10-04 ENCOUNTER — Ambulatory Visit
Admit: 2014-10-04 | Disposition: A | Discharge status: Home / Self Care | Payer: Self-pay | Attending: Internal Medicine | Admitting: Internal Medicine

## 2014-10-04 DIAGNOSIS — Z1231 Encounter for screening mammogram for malignant neoplasm of breast: Secondary | ICD-10-CM | POA: Diagnosis not present

## 2014-10-06 ENCOUNTER — Telehealth: Payer: Self-pay | Admitting: *Deleted

## 2014-10-06 NOTE — Telephone Encounter (Signed)
Pt daughter calling left foot swelling is gone. Now her bp is running low the past two nights.  She is asking what should she do since patient is not taking fluid pills anymore.  Please advise.   10/05/14: 107 at 10 pm 10/04/14: 109 at  9 pm

## 2014-10-06 NOTE — Telephone Encounter (Signed)
If blood pressure running low, would cut back a little bit on the clonidine or cut losartan in half

## 2014-10-06 NOTE — Consult Note (Signed)
PATIENT NAME:  Kathryn Short, Kathryn Short MR#:  [id number] DATE OF BIRTH:  [birth year]  DATE OF CONSULTATION:  05/25/2014  PRIMARY CARE PHYSICIAN:  Tracie Harrier, MD   CONSULTING PHYSICIAN:  Theodoro Grist, MD  CARDIOLOGIST:  Adam Phenix, MD from [facility].   HISTORY OF PRESENT ILLNESS:  The patient is a 81 year old Caucasian female with past medical history significant for a history multiple medical problems including paroxysmal atrial fibrillation, history of hypertension, obstructive sleep apnea, gastroesophageal reflux disease, hypothyroidism, uterine cancer, melanoma, also a total abdominal hysterectomy, bilateral salpingo-oophorectomy, colostomy due to perforation who presents to the hospital with complaints of shortness of breath, nausea and vomiting.  According to the patient, she was doing well up until approximately a few weeks ago when she started having increasing shortness of breath and intermittent chest pains. Pain was described as intermittent left-sided, radiating to the right side, achiness and radiating into the abdominal area.  She has been suffering with those kind of pains for a couple of years now.  She also has been complaining of stomach being hard and this time she developed nausea and vomiting on Monday, which was yesterday, at 7:00 p.m. She vomited straight for 4 hours and was having abdominal pain all over her abdomen.  She is not able to provide much more history and most of the history is obtained from the patient's daughter, who is present during my interview.  On arrival to the Emergency Room, she had CT scan of abdomen done, which revealed high grade bowel obstruction and surgical services were consulted.  The patient's labs also showed mild elevation of troponin to 0.08.  The patient did have significantly elevated blood pressure in the Emergency Room of 231/97 and according to patient's daughter she has been receiving nitroglycerin at home with no significant improvement  of her blood pressure unfortunately.  The patient also tells me that over the past 1 month, she has been having some cough and shortness of breath intermittently, as well as some wheezing. She had questionable bronchitis versus pneumonia with 3 rounds of antibiotic therapy approximately a month ago and her cough as well as wheezes and shortness of breath, as well as her sputum production is improving, however is not completely gone.  According to the nurses, the patient's NG tube was placed approximately 1-1/2 hours ago and no fluid was suctioned out from her stomach at this point yet.  Her last vomiting was at around 10:00 p.m.  The patient's blood pressure is much better now.   PAST MEDICAL HISTORY: Significant for history of atrial fibrillation, hypertension, obstructive sleep apnea on BiPAP, history of gastroesophageal reflux disease, hypothyroidism, which is treated, uterine cancer status post radiation therapy in 1952, melanoma, resected from the back.   PAST SURGICAL HISTORY: Total abdominal hysterectomy and bilateral salpingo-oophorectomy, colostomy for perforation, cholecystectomy, cystoscopy, also insertion replacement, and removal of pacemaker implantation of Medtronic pacemaker due to tachybrady syndrome, history of osteoporosis. The patient also had a colostomy in the past, which was taken down, cataract extraction and intraocular lens implantation.    MEDICATIONS: According to medical records, the patient is on Tylenol 500 mg 2 tablets orally 1 to 2 times a day as needed, acetaminophen hydrocodone 500/5 mg 1/2 tablet at bedtime, amiodarone 100 mg p.o. daily, aspirin 81 mg 2 tablets once daily, clonidine 0.1 mg 3 times daily as needed, dicyclomine 10 mg once daily as needed, docusate sodium 100 mg twice daily, guaifenesin 600 mg twice daily as needed, levothyroxine 50 mcg p.o.  daily, losartan 100 mg p.o. at bedtime, Macrobid 100 mg twice daily as needed, metoprolol tartrate 50 mg p.o. twice daily,  multivitamins once daily, nitroglycerin 0.4 mg sublingually every 5 minutes as needed, potassium chloride 10 mg 4 times weekly, probiotic formula 1 capsule once daily, super B complex tablet once daily, torsemide 10 mg p.o. 4 times weekly as needed.    ALLERGIES:  THE PATIENT IS INTOLERANT TO AMBIEN, HYDROCHLOROTHIAZIDE, ACE INHIBITORS, LUNESTA, LATEX, BACTRIM AS WELL AS DIGOXIN.   SOCIAL HISTORY: The patient lives with her daughter. No smoking or alcohol abuse.   FAMILY HISTORY: The patient's father died of unknown cancer. The patient's mother had diabetes as well as heart disease.   REVIEW OF SYSTEMS:  Difficult to obtain; however, the patient admits to nausea, vomiting, as well as chest pains, and abdominal pain, not able to elaborate much more about that.    PHYSICAL EXAMINATION:   VITAL SIGNS:  On arrival to the Emergency Room, the patient's vital signs: Temperature was 97.6, pulse was 65, respirations 22, blood pressure 231/97, and saturation was 95% on room air. During my evaluation, vitals, temperature 97.9, pulse was 60, respiration rate was 16 to 18, blood pressure 140/78, saturation was 95% on room air.  GENERAL: This is a well-developed, well-nourished Caucasian female walking to the stretcher.   HEENT: Her pupils are equal and reactive to light.  Extraocular movements intact, no icterus or conjunctivitis.  Has significant difficulty hearing. No pharyngeal erythema. Mucosa is dry.  NECK: No masses. Supple, nontender. Thyroid is not enlarged. No adenopathy. No JVD or carotid bruits bilaterally.  Full range of motion.  LUNGS: Clear to auscultation in all fields. No rales, rhonchi, diminished breath sounds or wheezing. No labored inspirations, increased effort, dullness to percussion or overt respiratory distress.  CARDIOVASCULAR: S1, S2 appreciated. Rhythm is regular.  PMI not lateralized. Chest is nontender to palpation. 1+ pedal pulses, 1 to 2+ lower extremity edema, especially on the  left side. No calf tenderness, or cyanosis was noted. Edema is mostly concentrated around her ankles and her feet.  ABDOMEN:  Soft, no significant discomfort and mild discomfort on palpation of lower abdomen, but no guarding or rigidity, bowel sounds are present. They are somewhat diminished in the lower part on the right side.  MUSCLE STRENGTH: Able to move all extremities. No cyanosis, degenerative joint disease or kyphosis. The patient's walk is somewhat unsteady and shuffling. SKIN:  Did not reveal any rashes, lesions, erythema, nodularity, or induration. It was warm and dry to palpation.  LYMPHATIC: No adenopathy in the cervical region.  NEUROLOGIC: Cranial nerves grossly intact. Sensory is intact. No dysarthria or aphasia. The patient is alert, oriented to person and place, cooperative. Memory is impaired, but no significant confusion, agitation, or depression noted.   LABORATORY DATA AND DIAGNOSTIC DATA:  EKG:  Electronic pacemaker at 61 beats per minute.  Beta-type natriuretic peptide 1178, glucose 159, sodium 135, otherwise BMP unremarkable.  The patient's troponin is elevated at 0.08 around 1:00 a.m. on 05/25/2014.  It was 0.11 around 4:00 to 5:00 a.m. on 05/25/2014.  White blood cell count is elevated to 12.2, hemoglobin was 13.0, platelet count was 233. Coagulation panel was unremarkable. The patient's urinalysis, amber cloudy urine, negative for glucose, bilirubin or ketones. Specific gravity 1.018, pH was 7.0. Negative for blood, protein, nitrites, 3+ leukocyte esterase, 100 red blood cells. 78 white blood cells, no bacteria, 2 epithelial cells were noted.   RADIOLOGIC STUDIES: Chest x-ray PA and lateral, 05/25/2014  revealed shallow lung inflation with mild bibasilar atelectasis bronchovascular crowding. No other acute cardiopulmonary abnormality according to radiology.  CT scan of abdomen and pelvis with contrast 05/25/2014, revealed high grade distal small bowel obstruction. likely right  lower quadrant adhesions, right and left colon resection with patent anastomosis, bladder wall thickening, suggest urinalysis to evaluate for cystitis.  Liver morphology suggesting cirrhosis according to radiology.   ASSESSMENT AND PLAN:  1.  Chest pain suspected related to abdominal problems, however, cannot completely rule out unstable angina. We will continue metoprolol IV for now.  We will also add nitroglycerin topically, aspirin rectally, and heparin subcutaneously for now. We will check cardiac enzymes x 3 and get echocardiogram and get cardiology consultation.  2.  Malignant essential hypertension likely due to abdominal pain. We will again continue metoprolol, clonidine patch as well as Vasotec IV if needed.  3.  Bowel obstruction.  A nasogastric tube is in and the patient will be hooked up to low intermittent suction according to surgical recommendations.  Pain medications as needed.  4.  Hyponatremia. We will continue IV fluids and we will follow sodium levels.   TIME SPENT: 50 minutes.     ____________________________ Theodoro Grist, MD rv:DT D: 05/25/2014 08:18:18 ET T: 05/25/2014 08:50:56 ET JOB#: [id number]  cc: Theodoro Grist, MD, <Dictator> Tracie Harrier, MD  Shadiyah Wernli MD ELECTRONICALLY SIGNED 07/01/2014 9:17

## 2014-10-07 NOTE — Telephone Encounter (Signed)
Spoke w/ Kathryn Short.  Advised her of Dr. Donivan Scull recommendation.  She reports that pt's BP in the am is usually elevated, today 160/79, HR 59. BP has been dropping in the evenings, last night 115/54, HR 60. Kathryn Short states that she has only given pt 1/2 clonidine on occasion, has not been taking TID. She has already been cutting losartan in 1/2, she did not give pt her torsemide or K+ last night or this am.  She states that pt stays hydrated, drinks when she is thirsty. She took pt to [employer] last night to try to get her BP up before bed. She would like to know if some of her meds need to be eliminated or perhaps the timing of them.   She woke pt up a 1 am to give her 1/2 losartan in hopes this would help, but her #s were still high in the am and low before bed.

## 2014-10-07 NOTE — Telephone Encounter (Signed)
I am okay with 160s in the morning, or letting it run up higher in the morning Would probably continue the half dose losartan and half dose clonidine in the morning May need to hold half dose clonidine and any losartan in the afternoon   I don't think 812 systolic is too low in the evening, actually okay  Would probably not wake in the middle of the night for pills, would just let it run.  As long as she feels well, would not worry too much Sometimes blood pressure will run low or when she has been on more torsemide. May not need as much pill including losartan and clonidine  If desperate, and blood pressure very low, could cut back on the torsemide

## 2014-10-08 NOTE — Telephone Encounter (Signed)
Spoke w/ Kathryn Short. Advised her of Dr. Donivan Scull recommendation. She verbalizes understanding and reports that pt's SBP last night 149. She feels that pt was a bit dehydrated, so she has been holding torsemide.  She will keep [place] posted and call back w/ any other questions or concerns.

## 2014-10-11 DIAGNOSIS — L820 Inflamed seborrheic keratosis: Secondary | ICD-10-CM | POA: Diagnosis not present

## 2014-10-11 DIAGNOSIS — B372 Candidiasis of skin and nail: Secondary | ICD-10-CM | POA: Diagnosis not present

## 2014-10-11 DIAGNOSIS — Z85828 Personal history of other malignant neoplasm of skin: Secondary | ICD-10-CM | POA: Diagnosis not present

## 2014-10-11 DIAGNOSIS — R208 Other disturbances of skin sensation: Secondary | ICD-10-CM | POA: Diagnosis not present

## 2014-10-11 DIAGNOSIS — L570 Actinic keratosis: Secondary | ICD-10-CM | POA: Diagnosis not present

## 2014-10-11 DIAGNOSIS — L538 Other specified erythematous conditions: Secondary | ICD-10-CM | POA: Diagnosis not present

## 2014-10-11 DIAGNOSIS — X32XXXA Exposure to sunlight, initial encounter: Secondary | ICD-10-CM | POA: Diagnosis not present

## 2014-10-11 DIAGNOSIS — D485 Neoplasm of uncertain behavior of skin: Secondary | ICD-10-CM | POA: Diagnosis not present

## 2014-10-11 DIAGNOSIS — D045 Carcinoma in situ of skin of trunk: Secondary | ICD-10-CM | POA: Diagnosis not present

## 2014-10-14 DIAGNOSIS — K219 Gastro-esophageal reflux disease without esophagitis: Secondary | ICD-10-CM | POA: Diagnosis not present

## 2014-10-14 DIAGNOSIS — R05 Cough: Secondary | ICD-10-CM | POA: Diagnosis not present

## 2014-10-14 DIAGNOSIS — E039 Hypothyroidism, unspecified: Secondary | ICD-10-CM | POA: Diagnosis not present

## 2014-10-14 DIAGNOSIS — I2510 Atherosclerotic heart disease of native coronary artery without angina pectoris: Secondary | ICD-10-CM | POA: Diagnosis not present

## 2014-10-14 DIAGNOSIS — I10 Essential (primary) hypertension: Secondary | ICD-10-CM | POA: Diagnosis not present

## 2014-10-14 DIAGNOSIS — I480 Paroxysmal atrial fibrillation: Secondary | ICD-10-CM | POA: Diagnosis not present

## 2014-10-15 ENCOUNTER — Ambulatory Visit (INDEPENDENT_AMBULATORY_CARE_PROVIDER_SITE_OTHER): Payer: Commercial Managed Care - HMO | Admitting: *Deleted

## 2014-10-15 DIAGNOSIS — Z950 Presence of cardiac pacemaker: Secondary | ICD-10-CM

## 2014-10-15 DIAGNOSIS — I495 Sick sinus syndrome: Secondary | ICD-10-CM

## 2014-10-15 LAB — CUP PACEART INCLINIC DEVICE CHECK
Battery Impedance: 2226 Ohm
Battery Remaining Longevity: 22 mo
Battery Voltage: 2.72 V
Brady Statistic AP VS Percent: 99 %
Brady Statistic AS VP Percent: 0 %
Date Time Interrogation Session: 20160506135336
Lead Channel Pacing Threshold Amplitude: 0.75 V
Lead Channel Pacing Threshold Pulse Width: 0.4 ms
Lead Channel Pacing Threshold Pulse Width: 0.4 ms
Lead Channel Setting Pacing Amplitude: 2 V
Lead Channel Setting Pacing Amplitude: 2.5 V
Lead Channel Setting Pacing Pulse Width: 0.4 ms
MDC IDC MSMT LEADCHNL RA IMPEDANCE VALUE: 382 Ohm
MDC IDC MSMT LEADCHNL RV IMPEDANCE VALUE: 411 Ohm
MDC IDC MSMT LEADCHNL RV PACING THRESHOLD AMPLITUDE: 1 V
MDC IDC MSMT LEADCHNL RV SENSING INTR AMPL: 8 mV
MDC IDC SET LEADCHNL RV SENSING SENSITIVITY: 2.8 mV
MDC IDC STAT BRADY AP VP PERCENT: 0 %
MDC IDC STAT BRADY AS VS PERCENT: 0 %

## 2014-10-15 NOTE — Progress Notes (Signed)
Pacemaker check in clinic. Normal device function. Thresholds, sensing, impedances consistent with previous measurements. Device programmed to maximize longevity. No mode switch or high ventricular rates noted. Device programmed at appropriate safety margins. Histogram distribution appropriate for patient activity level. Device programmed to optimize intrinsic conduction. Estimated longevity 54mo. ROV w/ SK/B in 12mo.

## 2014-11-01 ENCOUNTER — Ambulatory Visit (INDEPENDENT_AMBULATORY_CARE_PROVIDER_SITE_OTHER): Payer: Commercial Managed Care - HMO | Admitting: Cardiovascular Disease

## 2014-11-01 ENCOUNTER — Encounter: Payer: Self-pay | Admitting: Cardiovascular Disease

## 2014-11-01 VITALS — BP 130/70 | HR 81 | Ht 62.0 in | Wt 159.5 lb

## 2014-11-01 DIAGNOSIS — I10 Essential (primary) hypertension: Secondary | ICD-10-CM

## 2014-11-01 DIAGNOSIS — Z950 Presence of cardiac pacemaker: Secondary | ICD-10-CM

## 2014-11-01 DIAGNOSIS — R609 Edema, unspecified: Secondary | ICD-10-CM

## 2014-11-01 DIAGNOSIS — I4891 Unspecified atrial fibrillation: Secondary | ICD-10-CM

## 2014-11-01 DIAGNOSIS — I5033 Acute on chronic diastolic (congestive) heart failure: Secondary | ICD-10-CM | POA: Diagnosis not present

## 2014-11-01 MED ORDER — CLONIDINE HCL 0.1 MG PO TABS: 0.1000 mg | ORAL_TABLET | Freq: Three times a day (TID) | ORAL | Status: DC | PRN

## 2014-11-01 NOTE — Progress Notes (Signed)
Patient ID: Kathryn Short, female    DOB: 1921/11/16, 81 y.o.   MRN: [id number]  HPI Comments:  Kathryn Short is an 81 year old woman with paroxysmal atrial fibrillation (last episode in March 3235), diastolic CHF on a low-dose diuretic daily, labile hypertension,  sick sinus syndrome, pacemaker placement followed by Dr. Caryl Comes,  who presented to the hospital [facility] on 02/16/2012 in atrial fibrillation. She presents today for routine followup for her blood pressure and weight gain him a leg edema. Her past surgical history includes open cholecystectomy, partial colon resection and colostomy, appendectomy, hysterectomy, oophorectomy, GYN malignancy treated with radiation in the 1950s Recent bowel obstruction December 2015 She presents for routine follow-up of her blood pressure.  In follow-up today, blood pressure range is 120 up to 160s. In general she reports that she is doing well. Leg edema has improved but she has weight gain and continued cough Family presents with her today is concerned that her cough has persisted, daytime and nighttime. She had recent chest x-ray through primary care but results are not available Recent lab work shows normal renal function She is taking torsemide 20 mg alternating with 10 mg  Weight is up from 148 pounds December 2015 up to 150 pounds January 2016 now 159.8 pounds Continued lower extremity edema  EKG on today's visit shows narrow complex regular rhythm, rate 81 bpm  She is taking lactulose daily with no significant constipation No recent falls  Other past medical history  in the hospital 05/25/2014, with small bowel obstruction. She was placed on bowel rest, IV fluids and  she passed her bowels. She did not require surgery  Echocardiogram done in the hospital 05/25/2014 showing ejection fraction greater than 55%, moderate pulmonary hypertension, mild to moderate TR  Earlier in 2015, she had atrial tachycardia and pacer was tracking the atrial rhythm,   Her device was reprogrammed in the office for DDI  She was continued on her beta blocker and amiodarone and rhythm broke, converting back to normal sinus rhythm. Pacemaker has been since changed back to her prior settings   She is reluctant to use a walker, still uses a cane. Anticoagulation  discussed previouslywith the patient and her daughter. They are not interested. They preferred to stay on aspirin  history of falls with trauma requiring stitches. None recently Patient and daughter are afraid of her fall risk, other complications from anticoagulation. Currently not on anticoagulation at their request. They are aware of risks and benefits associated with arrhythmia and stroke.  Previous lab work; Total cholesterol 189, LDL 104, hematocrit 38, creatinine 0.8  previous visits to the  emergency room with chest pain, dizziness, malaise. She had a problem on isosorbide in the past. Feelings of insomnia, and anxious feeling, chest pain episodes at night that wake her from sleep, weakness, nausea, dizziness. She has obstructive sleep apnea and does not use her BiPAP at nighttime  Echocardiogram in the hospital showed ejection fraction 50-55%, moderate LVH, mild to moderate TR, right ventricular systolic pressure 40 mm of mercury, mild to moderate aortic valve insufficiency   Allergies  Allergen Reactions  . Ace Inhibitors   . Ambien [Zolpidem Tartrate]   . Bactrim [Sulfamethoxazole-Trimethoprim]   . Digoxin And Related   . Hctz [Hydrochlorothiazide]   . Lunesta [Eszopiclone]     Outpatient Encounter Prescriptions as of 11/01/2014  Medication Sig  . acetaminophen (TYLENOL) 500 MG tablet as needed. Takes 2-4 tablets daily.  Marland Kitchen amiodarone (PACERONE) 200 MG tablet Take 0.5 tablets (100  mg total) by mouth daily.  Marland Kitchen aspirin 81 MG tablet Take 162 mg by mouth daily.  Marland Kitchen b complex vitamins tablet Take 1 tablet by mouth daily.  . cloNIDine (CATAPRES) 0.1 MG tablet Take 1 tablet (0.1 mg total) by  mouth 3 (three) times daily as needed.  . dicyclomine (BENTYL) 10 MG capsule Take 10 mg by mouth daily. As needed  . lactulose (CHRONULAC) 10 GM/15ML solution Take 30 g by mouth daily.   Marland Kitchen levothyroxine (SYNTHROID, LEVOTHROID) 50 MCG tablet Take 50 mcg by mouth daily.   Marland Kitchen losartan (COZAAR) 100 MG tablet Take 1 tablet (100 mg total) by mouth daily.  . metoprolol (LOPRESSOR) 50 MG tablet Take 1 tablet (50 mg total) by mouth 2 (two) times daily.  . montelukast (SINGULAIR) 10 MG tablet Take 10 mg by mouth at bedtime.  . Multiple Vitamin (MULTIVITAMIN) tablet Take 1 tablet by mouth daily.  . nitrofurantoin, macrocrystal-monohydrate, (MACROBID) 100 MG capsule Take 100 mg by mouth 2 (two) times daily as needed.   . nitroGLYCERIN (NITROSTAT) 0.4 MG SL tablet Place 1 tablet (0.4 mg total) under the tongue every 5 (five) minutes as needed for chest pain.  . potassium chloride (K-DUR) 10 MEQ tablet Take 1 tablet alternate with 1/2 tablet.  . Probiotic Product (PROBIOTIC DAILY PO) Take by mouth daily.  Marland Kitchen torsemide (DEMADEX) 20 MG tablet Take one tablet alternate with half tablet daily.  . [DISCONTINUED] cloNIDine (CATAPRES) 0.1 MG tablet Take 1 tablet (0.1 mg total) by mouth 3 (three) times daily as needed.  . [DISCONTINUED] guaiFENesin (MUCINEX) 600 MG 12 hr tablet Take 1 tablet (600 mg total) by mouth 2 (two) times daily as needed. (Patient not taking: Reported on 11/01/2014)   No facility-administered encounter medications on file as of 11/01/2014.    Past Medical History  Diagnosis Date  . Atrial fibrillation   . Hypertension   . OSA (obstructive sleep apnea)     On BiPAP  . GERD (gastroesophageal reflux disease)   . Hypothyroidism     Treated  . Uterine cancer     s/p radiation therapy in 1952  . Melanoma     Resected from back  . Fall   . Bowel obstruction     Past Surgical History  Procedure Laterality Date  . Total abdominal hysterectomy w/ bilateral salpingoophorectomy    .  Colostomy      For perforation  . Cholecystectomy    . Cytoscopy    . Insert / replace / remove pacemaker      Implantation-Medtronic  . Skin biopsy      face    Social History  reports that she has never smoked. She does not have any smokeless tobacco history on file. She reports that she does not drink alcohol or use illicit drugs.  Family History Family history is unknown by patient.  Review of Systems  Constitutional: Positive for unexpected weight change.  Respiratory: Positive for cough.   Cardiovascular: Positive for leg swelling.  Gastrointestinal: Negative.   Musculoskeletal: Positive for gait problem.  Neurological: Negative.   All other systems reviewed and are negative.   BP 130/70 mmHg  Pulse 81  Ht 5\' 2"  (1.575 m)  Wt 159 lb 8 oz (72.349 kg)  BMI 29.17 kg/m2  Physical Exam  Constitutional: She is oriented to person, place, and time. She appears well-developed and well-nourished.  HENT:  Head: Normocephalic.  Nose: Nose normal.  Mouth/Throat: Oropharynx is clear and moist.  Eyes: Conjunctivae are  normal. Pupils are equal, round, and reactive to light.  Neck: Normal range of motion. Neck supple. No JVD present.  Cardiovascular: Normal rate, regular rhythm, S1 normal, S2 normal, normal heart sounds and intact distal pulses.  Exam reveals no gallop and no friction rub.   No murmur heard. Trace pitting edema to the mid shins  Pulmonary/Chest: Effort normal and breath sounds normal. No respiratory distress. She has no wheezes. She has no rales. She exhibits no tenderness.  Abdominal: Soft. Bowel sounds are normal. She exhibits no distension. There is no tenderness.  Musculoskeletal: Normal range of motion. She exhibits edema. She exhibits no tenderness.  Lymphadenopathy:    She has no cervical adenopathy.  Neurological: She is alert and oriented to person, place, and time. Coordination normal.  Skin: Skin is warm and dry. No rash noted. No erythema.   Psychiatric: She has a normal mood and affect. Her behavior is normal. Judgment and thought content normal.    Assessment and Plan  Nursing note and vitals reviewed.

## 2014-11-01 NOTE — Assessment & Plan Note (Signed)
No medication changes made. Daughter is concerned about low blood pressure in the evenings. This typically runs 120-140 otherwise pressure 140s to 150s during the day

## 2014-11-01 NOTE — Assessment & Plan Note (Signed)
Patient and family has chosen not to be on anticoagulation in the past. They are aware risk and benefit, including risk of stroke and bleeding

## 2014-11-01 NOTE — Patient Instructions (Signed)
   Please take torsemide daily for shortness of breath, weight gain, cough  Please call us if you have new issues that need to be addressed before your next appt.  Your physician wants you to follow-up in: 1 month.

## 2014-11-01 NOTE — Assessment & Plan Note (Signed)
Notes indicating pacemaker is working well. This was reviewed with the patient

## 2014-11-01 NOTE — Assessment & Plan Note (Signed)
I'm concerned about her 10 pound weight gain over the past 6 months. We have recommended that she increase torsemide up to 20 mg daily and try to restrict her fluid intake Recent normal BMP Could consider BNP when she sees primary care with repeat BMP

## 2014-11-01 NOTE — Assessment & Plan Note (Signed)
Trace pitting edema to the mid shins. Likely component of chronic venous insufficiency. Recommended compression hose

## 2014-11-10 ENCOUNTER — Other Ambulatory Visit: Payer: Self-pay | Admitting: Cardiovascular Disease

## 2014-11-23 ENCOUNTER — Other Ambulatory Visit: Payer: Self-pay | Admitting: Cardiovascular Disease

## 2014-11-25 DIAGNOSIS — Z87440 Personal history of urinary (tract) infections: Secondary | ICD-10-CM | POA: Diagnosis not present

## 2014-11-25 DIAGNOSIS — E079 Disorder of thyroid, unspecified: Secondary | ICD-10-CM | POA: Diagnosis not present

## 2014-11-25 DIAGNOSIS — I10 Essential (primary) hypertension: Secondary | ICD-10-CM | POA: Diagnosis not present

## 2014-11-25 DIAGNOSIS — I480 Paroxysmal atrial fibrillation: Secondary | ICD-10-CM | POA: Diagnosis not present

## 2014-11-26 ENCOUNTER — Encounter: Payer: Self-pay | Admitting: Internal Medicine

## 2014-12-14 ENCOUNTER — Other Ambulatory Visit: Payer: Self-pay | Admitting: Cardiovascular Disease

## 2014-12-15 ENCOUNTER — Ambulatory Visit (INDEPENDENT_AMBULATORY_CARE_PROVIDER_SITE_OTHER): Payer: Commercial Managed Care - HMO | Admitting: Cardiovascular Disease

## 2014-12-15 ENCOUNTER — Encounter: Payer: Self-pay | Admitting: Cardiovascular Disease

## 2014-12-15 VITALS — BP 140/68 | HR 72 | Ht 62.0 in | Wt 164.5 lb

## 2014-12-15 DIAGNOSIS — I5033 Acute on chronic diastolic (congestive) heart failure: Secondary | ICD-10-CM

## 2014-12-15 DIAGNOSIS — R609 Edema, unspecified: Secondary | ICD-10-CM

## 2014-12-15 DIAGNOSIS — I480 Paroxysmal atrial fibrillation: Secondary | ICD-10-CM

## 2014-12-15 NOTE — Progress Notes (Signed)
Patient ID: Kathryn Short, female    DOB: 1921/07/22, 81 y.o.   MRN: [id number]  HPI Comments:  Kathryn Short is an 81 year old woman with paroxysmal atrial fibrillation (last episode in March 1749), diastolic CHF on a low-dose diuretic daily, labile hypertension,  sick sinus syndrome, pacemaker placement followed by Dr. Caryl Comes,  who presented to the hospital [facility] on 02/16/2012 in atrial fibrillation. She presents today for routine followup for her blood pressure and weight gain him a leg edema. Her past surgical history includes open cholecystectomy, partial colon resection and colostomy, appendectomy, hysterectomy, oophorectomy, GYN malignancy treated with radiation in the 1950s Recent bowel obstruction December 2015 She presents for routine follow-up of her blood pressure  In follow-up, her blood pressure is typically between 120 and 150 She reports having a numbness or funny feeling in her head starting at her for head and radiating backwards Daughter reports she has had this before Chest congestion seems to have cleared up after course of prednisone She is taking torsemide 20 mg alternating with 10 mg Leg edema typically on the left, none on the right. Wearing compression hose  Weight is up from 148 pounds December 2015 up to 150 pounds January 2016 159.8 pounds several months ago, now 164.8 pounds  She is taking lactulose daily with no significant constipation No recent falls  Other past medical history  in the hospital 05/25/2014, with small bowel obstruction. She was placed on bowel rest, IV fluids and  she passed her bowels. She did not require surgery  Echocardiogram done in the hospital 05/25/2014 showing ejection fraction greater than 55%, moderate pulmonary hypertension, mild to moderate TR  Earlier in 2015, she had atrial tachycardia and pacer was tracking the atrial rhythm,  Her device was reprogrammed in the office for DDI  She was continued on her beta blocker and amiodarone  and rhythm broke, converting back to normal sinus rhythm. Pacemaker has been since changed back to her prior settings   She is reluctant to use a walker, still uses a cane. Anticoagulation  discussed previouslywith the patient and her daughter. They are not interested. They preferred to stay on aspirin  history of falls with trauma requiring stitches. None recently Patient and daughter are afraid of her fall risk, other complications from anticoagulation. Currently not on anticoagulation at their request. They are aware of risks and benefits associated with arrhythmia and stroke.  Previous lab work; Total cholesterol 189, LDL 104, hematocrit 38, creatinine 0.8  previous visits to the  emergency room with chest pain, dizziness, malaise. She had a problem on isosorbide in the past. Feelings of insomnia, and anxious feeling, chest pain episodes at night that wake her from sleep, weakness, nausea, dizziness. She has obstructive sleep apnea and does not use her BiPAP at nighttime  Echocardiogram in the hospital showed ejection fraction 50-55%, moderate LVH, mild to moderate TR, right ventricular systolic pressure 40 mm of mercury, mild to moderate aortic valve insufficiency   Allergies  Allergen Reactions  . Ace Inhibitors   . Ambien [Zolpidem Tartrate]   . Bactrim [Sulfamethoxazole-Trimethoprim]   . Digoxin And Related   . Hctz [Hydrochlorothiazide]   . Lunesta [Eszopiclone]     Outpatient Encounter Prescriptions as of 12/15/2014  Medication Sig  . acetaminophen (TYLENOL) 500 MG tablet as needed. Takes 2-4 tablets daily.  Marland Kitchen amiodarone (PACERONE) 200 MG tablet Take 0.5 tablets (100 mg total) by mouth daily.  Marland Kitchen aspirin 81 MG tablet Take 162 mg by mouth daily.  Marland Kitchen  b complex vitamins tablet Take 1 tablet by mouth daily.  . cloNIDine (CATAPRES) 0.1 MG tablet Take 1 tablet (0.1 mg total) by mouth 3 (three) times daily as needed.  . dicyclomine (BENTYL) 10 MG capsule Take 10 mg by mouth daily. As  needed  . lactulose (CHRONULAC) 10 GM/15ML solution Take 30 g by mouth daily.   Marland Kitchen levothyroxine (SYNTHROID, LEVOTHROID) 50 MCG tablet Take 50 mcg by mouth daily.   Marland Kitchen losartan (COZAAR) 100 MG tablet TAKE ONE (1) TABLET EACH DAY  . metoprolol (LOPRESSOR) 50 MG tablet TAKE ONE TABLET TWICE DAILY  . montelukast (SINGULAIR) 10 MG tablet Take 10 mg by mouth at bedtime.  . Multiple Vitamin (MULTIVITAMIN) tablet Take 1 tablet by mouth daily.  . nitrofurantoin, macrocrystal-monohydrate, (MACROBID) 100 MG capsule Take 100 mg by mouth 2 (two) times daily as needed.   . nitroGLYCERIN (NITROSTAT) 0.4 MG SL tablet Place 1 tablet (0.4 mg total) under the tongue every 5 (five) minutes as needed for chest pain.  . potassium chloride (K-DUR) 10 MEQ tablet Take 1 tablet alternate with 1/2 tablet.  . potassium chloride (K-DUR,KLOR-CON) 10 MEQ tablet TAKE ONE TABLET BY MOUTH DAILY AS NEEDED  . Probiotic Product (PROBIOTIC DAILY PO) Take by mouth daily.  Marland Kitchen torsemide (DEMADEX) 20 MG tablet Take one tablet alternate with half tablet daily.  Marland Kitchen torsemide (DEMADEX) 20 MG tablet TAKE ONE TABLET BY MOUTH DAILY AS NEEDED   No facility-administered encounter medications on file as of 12/15/2014.    Past Medical History  Diagnosis Date  . Atrial fibrillation   . Hypertension   . OSA (obstructive sleep apnea)     On BiPAP  . GERD (gastroesophageal reflux disease)   . Hypothyroidism     Treated  . Uterine cancer     s/p radiation therapy in 1952  . Melanoma     Resected from back  . Fall   . Bowel obstruction     Past Surgical History  Procedure Laterality Date  . Total abdominal hysterectomy w/ bilateral salpingoophorectomy    . Colostomy      For perforation  . Cholecystectomy    . Cytoscopy    . Insert / replace / remove pacemaker      Implantation-[employer]  . Skin biopsy      face    Social History  reports that she has never smoked. She does not have any smokeless tobacco history on file. She  reports that she does not drink alcohol or use illicit drugs.  Family History Family history is unknown by patient.  Review of Systems  Constitutional: Positive for unexpected weight change.  Respiratory: Negative.   Cardiovascular: Positive for leg swelling.  Gastrointestinal: Negative.   Musculoskeletal: Positive for gait problem.  Neurological: Negative.   All other systems reviewed and are negative.   BP 140/68 mmHg  Pulse 72  Ht 5\' 2"  (1.575 m)  Wt 164 lb 8 oz (74.617 kg)  BMI 30.08 kg/m2  Physical Exam  Constitutional: She is oriented to person, place, and time. She appears well-developed and well-nourished.  HENT:  Head: Normocephalic.  Nose: Nose normal.  Mouth/Throat: Oropharynx is clear and moist.  Eyes: Conjunctivae are normal. Pupils are equal, round, and reactive to light.  Neck: Normal range of motion. Neck supple. No JVD present.  Cardiovascular: Normal rate, regular rhythm, S1 normal, S2 normal, normal heart sounds and intact distal pulses.  Exam reveals no gallop and no friction rub.   No murmur heard. No  significant edema of the right lower extremity, trace pitting edema of the left lower extremity  Pulmonary/Chest: Effort normal and breath sounds normal. No respiratory distress. She has no wheezes. She has no rales. She exhibits no tenderness.  Abdominal: Soft. Bowel sounds are normal. She exhibits no distension. There is no tenderness.  Musculoskeletal: Normal range of motion. She exhibits edema. She exhibits no tenderness.  Lymphadenopathy:    She has no cervical adenopathy.  Neurological: She is alert and oriented to person, place, and time. Coordination normal.  Skin: Skin is warm and dry. No rash noted. No erythema.  Psychiatric: She has a normal mood and affect. Her behavior is normal. Judgment and thought content normal.    Assessment and Plan  Nursing note and vitals reviewed.

## 2014-12-15 NOTE — Assessment & Plan Note (Signed)
Weight continues to trend upwards. Likely from recent prednisone, food weight She has been maintained on regular diabetic regiment, denies excess fluid intake Minimal edema of her right lower extremity, venous insufficiency of the left lower extremity Recommended she continue her compression hose

## 2014-12-15 NOTE — Patient Instructions (Addendum)
You are doing well. No medication changes were made.  Please call us if you have new issues that need to be addressed before your next appt.  Your physician wants you to follow-up in: 1 month.

## 2014-12-15 NOTE — Assessment & Plan Note (Signed)
Normal sinus rhythm on clinical exam today

## 2014-12-15 NOTE — Assessment & Plan Note (Signed)
Left lower extremity trace edema likely chronic venous insufficiency

## 2014-12-22 DIAGNOSIS — D045 Carcinoma in situ of skin of trunk: Secondary | ICD-10-CM | POA: Diagnosis not present

## 2014-12-31 DIAGNOSIS — I8310 Varicose veins of unspecified lower extremity with inflammation: Secondary | ICD-10-CM | POA: Diagnosis not present

## 2014-12-31 DIAGNOSIS — I509 Heart failure, unspecified: Secondary | ICD-10-CM | POA: Diagnosis not present

## 2014-12-31 DIAGNOSIS — M79609 Pain in unspecified limb: Secondary | ICD-10-CM | POA: Diagnosis not present

## 2014-12-31 DIAGNOSIS — I499 Cardiac arrhythmia, unspecified: Secondary | ICD-10-CM | POA: Diagnosis not present

## 2014-12-31 DIAGNOSIS — I10 Essential (primary) hypertension: Secondary | ICD-10-CM | POA: Diagnosis not present

## 2014-12-31 DIAGNOSIS — M7989 Other specified soft tissue disorders: Secondary | ICD-10-CM | POA: Diagnosis not present

## 2015-01-07 DIAGNOSIS — I10 Essential (primary) hypertension: Secondary | ICD-10-CM | POA: Diagnosis not present

## 2015-01-07 DIAGNOSIS — E039 Hypothyroidism, unspecified: Secondary | ICD-10-CM | POA: Diagnosis not present

## 2015-01-07 DIAGNOSIS — I480 Paroxysmal atrial fibrillation: Secondary | ICD-10-CM | POA: Diagnosis not present

## 2015-01-07 DIAGNOSIS — R0602 Shortness of breath: Secondary | ICD-10-CM | POA: Diagnosis not present

## 2015-01-07 DIAGNOSIS — R05 Cough: Secondary | ICD-10-CM | POA: Diagnosis not present

## 2015-01-07 DIAGNOSIS — R7989 Other specified abnormal findings of blood chemistry: Secondary | ICD-10-CM | POA: Diagnosis not present

## 2015-02-09 ENCOUNTER — Ambulatory Visit: Payer: Commercial Managed Care - HMO | Admitting: Cardiovascular Disease

## 2015-02-18 DIAGNOSIS — K219 Gastro-esophageal reflux disease without esophagitis: Secondary | ICD-10-CM | POA: Diagnosis not present

## 2015-02-18 DIAGNOSIS — R6889 Other general symptoms and signs: Secondary | ICD-10-CM | POA: Diagnosis not present

## 2015-02-18 DIAGNOSIS — I10 Essential (primary) hypertension: Secondary | ICD-10-CM | POA: Diagnosis not present

## 2015-02-18 DIAGNOSIS — R202 Paresthesia of skin: Secondary | ICD-10-CM | POA: Diagnosis not present

## 2015-02-18 DIAGNOSIS — E079 Disorder of thyroid, unspecified: Secondary | ICD-10-CM | POA: Diagnosis not present

## 2015-02-18 DIAGNOSIS — R200 Anesthesia of skin: Secondary | ICD-10-CM | POA: Diagnosis not present

## 2015-02-21 ENCOUNTER — Other Ambulatory Visit: Payer: Self-pay | Admitting: Internal Medicine

## 2015-02-21 DIAGNOSIS — R6889 Other general symptoms and signs: Secondary | ICD-10-CM

## 2015-02-21 DIAGNOSIS — R202 Paresthesia of skin: Secondary | ICD-10-CM

## 2015-02-28 ENCOUNTER — Ambulatory Visit
Admission: RE | Admit: 2015-02-28 | Discharge: 2015-02-28 | Disposition: A | Discharge status: Home / Self Care | Payer: Commercial Managed Care - HMO | Source: Ambulatory Visit | Attending: Internal Medicine | Admitting: Internal Medicine

## 2015-02-28 DIAGNOSIS — R200 Anesthesia of skin: Secondary | ICD-10-CM | POA: Diagnosis not present

## 2015-02-28 DIAGNOSIS — R6889 Other general symptoms and signs: Secondary | ICD-10-CM | POA: Insufficient documentation

## 2015-02-28 DIAGNOSIS — G319 Degenerative disease of nervous system, unspecified: Secondary | ICD-10-CM | POA: Insufficient documentation

## 2015-02-28 DIAGNOSIS — R202 Paresthesia of skin: Secondary | ICD-10-CM

## 2015-03-22 DIAGNOSIS — I10 Essential (primary) hypertension: Secondary | ICD-10-CM | POA: Diagnosis not present

## 2015-03-22 DIAGNOSIS — I499 Cardiac arrhythmia, unspecified: Secondary | ICD-10-CM | POA: Diagnosis not present

## 2015-03-22 DIAGNOSIS — I890 Lymphedema, not elsewhere classified: Secondary | ICD-10-CM | POA: Diagnosis not present

## 2015-03-22 DIAGNOSIS — I872 Venous insufficiency (chronic) (peripheral): Secondary | ICD-10-CM | POA: Diagnosis not present

## 2015-03-22 DIAGNOSIS — I509 Heart failure, unspecified: Secondary | ICD-10-CM | POA: Diagnosis not present

## 2015-03-22 DIAGNOSIS — M79609 Pain in unspecified limb: Secondary | ICD-10-CM | POA: Diagnosis not present

## 2015-03-22 DIAGNOSIS — I8310 Varicose veins of unspecified lower extremity with inflammation: Secondary | ICD-10-CM | POA: Diagnosis not present

## 2015-03-22 DIAGNOSIS — M7989 Other specified soft tissue disorders: Secondary | ICD-10-CM | POA: Diagnosis not present

## 2015-03-28 ENCOUNTER — Ambulatory Visit (INDEPENDENT_AMBULATORY_CARE_PROVIDER_SITE_OTHER): Payer: Commercial Managed Care - HMO | Admitting: Cardiovascular Disease

## 2015-03-28 ENCOUNTER — Encounter: Payer: Self-pay | Admitting: Cardiovascular Disease

## 2015-03-28 VITALS — BP 128/64 | HR 61 | Ht 62.0 in | Wt 162.5 lb

## 2015-03-28 DIAGNOSIS — I10 Essential (primary) hypertension: Secondary | ICD-10-CM

## 2015-03-28 DIAGNOSIS — I5033 Acute on chronic diastolic (congestive) heart failure: Secondary | ICD-10-CM | POA: Diagnosis not present

## 2015-03-28 DIAGNOSIS — I471 Supraventricular tachycardia: Secondary | ICD-10-CM

## 2015-03-28 DIAGNOSIS — I480 Paroxysmal atrial fibrillation: Secondary | ICD-10-CM | POA: Diagnosis not present

## 2015-03-28 MED ORDER — AMIODARONE HCL 200 MG PO TABS: 100.0000 mg | ORAL_TABLET | Freq: Every day | ORAL | Status: DC

## 2015-03-28 MED ORDER — LOSARTAN POTASSIUM 100 MG PO TABS: 100.0000 mg | ORAL_TABLET | Freq: Every day | ORAL | Status: DC

## 2015-03-28 MED ORDER — TORSEMIDE 20 MG PO TABS: 20.0000 mg | ORAL_TABLET | Freq: Every day | ORAL | Status: DC | PRN

## 2015-03-28 MED ORDER — CLONIDINE HCL 0.1 MG PO TABS: 0.1000 mg | ORAL_TABLET | Freq: Three times a day (TID) | ORAL | Status: DC | PRN

## 2015-03-28 MED ORDER — NITROGLYCERIN 0.4 MG SL SUBL: 0.4000 mg | SUBLINGUAL_TABLET | SUBLINGUAL | Status: DC | PRN

## 2015-03-28 MED ORDER — POTASSIUM CHLORIDE CRYS ER 10 MEQ PO TBCR: 10.0000 meq | EXTENDED_RELEASE_TABLET | Freq: Every day | ORAL | Status: DC | PRN

## 2015-03-28 MED ORDER — METOPROLOL TARTRATE 50 MG PO TABS: 50.0000 mg | ORAL_TABLET | Freq: Two times a day (BID) | ORAL | Status: DC

## 2015-03-28 NOTE — Assessment & Plan Note (Signed)
Labile blood pressures. Morning blood pressure elevated likely after evening medications have worn off. Suggested no major changes, could try to give early morning clonidine earlier

## 2015-03-28 NOTE — Patient Instructions (Signed)
You are doing well. No medication changes were made.  Please call us if you have new issues that need to be addressed before your next appt.  Your physician wants you to follow-up in: 2 month.

## 2015-03-28 NOTE — Assessment & Plan Note (Signed)
She is doing well with torsemide 20 mg alternating with 10 mg daily

## 2015-03-28 NOTE — Progress Notes (Addendum)
Patient ID: Kathryn Short, female    DOB: 1921/06/24, 81 y.o.   MRN: [id number]  HPI Comments:  Kathryn Short is an 81 year old woman with paroxysmal atrial fibrillation (last episode in March 2426), diastolic CHF on a low-dose diuretic daily, labile hypertension,  sick sinus syndrome, pacemaker placement followed by Dr. Caryl Comes,  who presented to the hospital [facility] on 02/16/2012 in atrial fibrillation. Kathryn Short presents today for routine followup for her blood pressure and weight gain him a leg edema. Her past surgical history includes open cholecystectomy, partial colon resection and colostomy, appendectomy, hysterectomy, oophorectomy, GYN malignancy treated with radiation in the 1950s Recent bowel obstruction December 2015 Kathryn Short presents for routine follow-up of her blood pressure  In follow-up, her blood pressure is typically between 140 and 180  blood pressures elevated in the morning  Kathryn Short receives her clonidine at 9:00 p.m., it again later in the morning   Kathryn Short denies any symptoms. Wearing compression hose with improvement of her leg edema Kathryn Short is taking torsemide 20 mg alternating with 10 mg Kathryn Short reports weight is stable   I fall risk, uses a cane Kathryn Short is taking lactulose daily with no significant constipation No recent falls  EKG shows paced rhythm at 62 bpm  Other past medical history  in the hospital 05/25/2014, with small bowel obstruction. Kathryn Short was placed on bowel rest, IV fluids and  Kathryn Short passed her bowels. Kathryn Short did not require surgery  Echocardiogram done in the hospital 05/25/2014 showing ejection fraction greater than 55%, moderate pulmonary hypertension, mild to moderate TR  Earlier in 2015, Kathryn Short had atrial tachycardia and pacer was tracking the atrial rhythm,  Her device was reprogrammed in the office for DDI  Kathryn Short was continued on her beta blocker and amiodarone and rhythm broke, converting back to normal sinus rhythm. Pacemaker has been since changed back to her prior settings   Kathryn Short is  reluctant to use a walker, still uses a cane. Anticoagulation  discussed previouslywith the patient and her daughter. They are not interested. They preferred to stay on aspirin  history of falls with trauma requiring stitches. None recently Patient and daughter are afraid of her fall risk, other complications from anticoagulation. Currently not on anticoagulation at their request. They are aware of risks and benefits associated with arrhythmia and stroke.  Previous lab work; Total cholesterol 189, LDL 104, hematocrit 38, creatinine 0.8  previous visits to the  emergency room with chest pain, dizziness, malaise. Kathryn Short had a problem on isosorbide in the past. Feelings of insomnia, and anxious feeling, chest pain episodes at night that wake her from sleep, weakness, nausea, dizziness. Kathryn Short has obstructive sleep apnea and does not use her BiPAP at nighttime  Echocardiogram in the hospital showed ejection fraction 50-55%, moderate LVH, mild to moderate TR, right ventricular systolic pressure 40 mm of mercury, mild to moderate aortic valve insufficiency   Allergies  Allergen Reactions  . Ace Inhibitors   . Ambien [Zolpidem Tartrate]   . Bactrim [Sulfamethoxazole-Trimethoprim]   . Digoxin And Related   . Hctz [Hydrochlorothiazide]   . Lunesta [Eszopiclone]     Outpatient Encounter Prescriptions as of 03/28/2015  Medication Sig  . acetaminophen (TYLENOL) 500 MG tablet as needed. Takes 2-4 tablets daily.  Marland Kitchen albuterol (PROVENTIL HFA;VENTOLIN HFA) 108 (90 BASE) MCG/ACT inhaler Inhale into the lungs every 6 (six) hours as needed for wheezing or shortness of breath.  Marland Kitchen amiodarone (PACERONE) 200 MG tablet Take 0.5 tablets (100 mg total) by mouth daily.  Marland Kitchen aspirin  81 MG tablet Take 162 mg by mouth daily.  Marland Kitchen b complex vitamins tablet Take 1 tablet by mouth daily.  . cloNIDine (CATAPRES) 0.1 MG tablet Take 1 tablet (0.1 mg total) by mouth 3 (three) times daily as needed.  Marland Kitchen dextromethorphan-guaiFENesin  (MUCINEX DM) 30-600 MG 12hr tablet Take 1 tablet by mouth daily.  Marland Kitchen dicyclomine (BENTYL) 10 MG capsule Take 10 mg by mouth daily. As needed  . lactulose (CHRONULAC) 10 GM/15ML solution Take 30 g by mouth daily.   Marland Kitchen levothyroxine (SYNTHROID, LEVOTHROID) 50 MCG tablet Take 50 mcg by mouth daily.   Marland Kitchen losartan (COZAAR) 100 MG tablet Take 1 tablet (100 mg total) by mouth daily.  . metoprolol (LOPRESSOR) 50 MG tablet Take 1 tablet (50 mg total) by mouth 2 (two) times daily.  . montelukast (SINGULAIR) 10 MG tablet Take 10 mg by mouth at bedtime.  . Multiple Vitamin (MULTIVITAMIN) tablet Take 1 tablet by mouth daily.  . nitrofurantoin, macrocrystal-monohydrate, (MACROBID) 100 MG capsule Take 100 mg by mouth 2 (two) times daily as needed.   . potassium chloride (K-DUR) 10 MEQ tablet Take 1 tablet alternate with 1/2 tablet.  . Probiotic Product (PROBIOTIC DAILY PO) Take by mouth daily.  . [DISCONTINUED] amiodarone (PACERONE) 200 MG tablet Take 0.5 tablets (100 mg total) by mouth daily.  . [DISCONTINUED] cloNIDine (CATAPRES) 0.1 MG tablet Take 1 tablet (0.1 mg total) by mouth 3 (three) times daily as needed.  . [DISCONTINUED] losartan (COZAAR) 100 MG tablet TAKE ONE (1) TABLET EACH DAY  . [DISCONTINUED] metoprolol (LOPRESSOR) 50 MG tablet TAKE ONE TABLET TWICE DAILY  . [DISCONTINUED] nitroGLYCERIN (NITROSTAT) 0.4 MG SL tablet Place 1 tablet (0.4 mg total) under the tongue every 5 (five) minutes as needed for chest pain.  . [DISCONTINUED] torsemide (DEMADEX) 20 MG tablet TAKE ONE TABLET BY MOUTH DAILY AS NEEDED (Patient taking differently: Take 1 tablet by mouth daily alternate with 1/2 tablet.)  . nitroGLYCERIN (NITROSTAT) 0.4 MG SL tablet Place 1 tablet (0.4 mg total) under the tongue every 5 (five) minutes as needed for chest pain.  . potassium chloride (K-DUR,KLOR-CON) 10 MEQ tablet Take 1 tablet (10 mEq total) by mouth daily as needed.  . torsemide (DEMADEX) 20 MG tablet Take 1 tablet (20 mg total) by  mouth daily as needed.  . [DISCONTINUED] potassium chloride (K-DUR,KLOR-CON) 10 MEQ tablet TAKE ONE TABLET BY MOUTH DAILY AS NEEDED (Patient not taking: Reported on 03/28/2015)  . [DISCONTINUED] torsemide (DEMADEX) 20 MG tablet Take one tablet alternate with half tablet daily.   No facility-administered encounter medications on file as of 03/28/2015.    Past Medical History  Diagnosis Date  . Atrial fibrillation ([place])   . Hypertension   . OSA (obstructive sleep apnea)     On BiPAP  . GERD (gastroesophageal reflux disease)   . Hypothyroidism     Treated  . Uterine cancer ([place])     s/p radiation therapy in 1952  . Melanoma ([place])     Resected from back  . Fall   . Bowel obstruction [facility])     Past Surgical History  Procedure Laterality Date  . Total abdominal hysterectomy w/ bilateral salpingoophorectomy    . Colostomy      For perforation  . Cholecystectomy    . Cytoscopy    . Insert / replace / remove pacemaker      Implantation-[employer]  . Skin biopsy      face    Social History  reports that Kathryn Short has  never smoked. Kathryn Short does not have any smokeless tobacco history on file. Kathryn Short reports that Kathryn Short does not drink alcohol or use illicit drugs.  Family History Family history is unknown by patient.  Review of Systems  Constitutional: Negative.   HENT: Negative.   Respiratory: Negative.   Cardiovascular: Positive for leg swelling.  Gastrointestinal: Negative.   Musculoskeletal: Positive for gait problem.  Neurological: Negative.   All other systems reviewed and are negative.   BP 128/64 mmHg  Pulse 61  Ht 5\' 2"  (1.575 m)  Wt 162 lb 8 oz (73.71 kg)  BMI 29.71 kg/m2  Physical Exam  Constitutional: Kathryn Short is oriented to person, place, and time. Kathryn Short appears well-developed and well-nourished.  HENT:  Head: Normocephalic.  Nose: Nose normal.  Mouth/Throat: Oropharynx is clear and moist.  Eyes: Conjunctivae are normal. Pupils are equal, round, and reactive to light.   Neck: Normal range of motion. Neck supple. No JVD present.  Cardiovascular: Normal rate, regular rhythm, S1 normal, S2 normal, normal heart sounds and intact distal pulses.  Exam reveals no gallop and no friction rub.   No murmur heard. No significant edema of the right lower extremity, trace pitting edema of the left lower extremity  Pulmonary/Chest: Effort normal and breath sounds normal. No respiratory distress. Kathryn Short has no wheezes. Kathryn Short has no rales. Kathryn Short exhibits no tenderness.  Abdominal: Soft. Bowel sounds are normal. Kathryn Short exhibits no distension. There is no tenderness.  Musculoskeletal: Normal range of motion. Kathryn Short exhibits edema. Kathryn Short exhibits no tenderness.  Lymphadenopathy:    Kathryn Short has no cervical adenopathy.  Neurological: Kathryn Short is alert and oriented to person, place, and time. Coordination normal.  Skin: Skin is warm and dry. No rash noted. No erythema.  Psychiatric: Kathryn Short has a normal mood and affect. Her behavior is normal. Judgment and thought content normal.    Assessment and Plan  Nursing note and vitals reviewed.

## 2015-03-28 NOTE — Assessment & Plan Note (Signed)
Currently on low-dose amiodarone, metoprolol. No symptoms

## 2015-04-01 DIAGNOSIS — I480 Paroxysmal atrial fibrillation: Secondary | ICD-10-CM | POA: Diagnosis not present

## 2015-04-01 DIAGNOSIS — M810 Age-related osteoporosis without current pathological fracture: Secondary | ICD-10-CM | POA: Diagnosis not present

## 2015-04-01 DIAGNOSIS — K219 Gastro-esophageal reflux disease without esophagitis: Secondary | ICD-10-CM | POA: Diagnosis not present

## 2015-04-01 DIAGNOSIS — I10 Essential (primary) hypertension: Secondary | ICD-10-CM | POA: Diagnosis not present

## 2015-04-01 DIAGNOSIS — R609 Edema, unspecified: Secondary | ICD-10-CM | POA: Diagnosis not present

## 2015-04-13 DIAGNOSIS — I890 Lymphedema, not elsewhere classified: Secondary | ICD-10-CM | POA: Diagnosis not present

## 2015-05-31 ENCOUNTER — Ambulatory Visit (INDEPENDENT_AMBULATORY_CARE_PROVIDER_SITE_OTHER): Payer: Commercial Managed Care - HMO | Admitting: Cardiovascular Disease

## 2015-05-31 ENCOUNTER — Encounter: Payer: Self-pay | Admitting: Cardiovascular Disease

## 2015-05-31 VITALS — BP 140/70 | HR 64 | Ht 62.0 in | Wt 162.0 lb

## 2015-05-31 DIAGNOSIS — I480 Paroxysmal atrial fibrillation: Secondary | ICD-10-CM

## 2015-05-31 DIAGNOSIS — I5033 Acute on chronic diastolic (congestive) heart failure: Secondary | ICD-10-CM

## 2015-05-31 DIAGNOSIS — I10 Essential (primary) hypertension: Secondary | ICD-10-CM

## 2015-05-31 DIAGNOSIS — K5669 Other intestinal obstruction: Secondary | ICD-10-CM

## 2015-05-31 DIAGNOSIS — K56609 Unspecified intestinal obstruction, unspecified as to partial versus complete obstruction: Secondary | ICD-10-CM

## 2015-05-31 DIAGNOSIS — I471 Supraventricular tachycardia: Secondary | ICD-10-CM | POA: Diagnosis not present

## 2015-05-31 DIAGNOSIS — I4719 Other supraventricular tachycardia: Secondary | ICD-10-CM

## 2015-05-31 NOTE — Assessment & Plan Note (Signed)
Blood pressure is well controlled on today's visit. No changes made to the medications. 

## 2015-05-31 NOTE — Patient Instructions (Signed)
You are doing well. No medication changes were made.  Please call us if you have new issues that need to be addressed before your next appt.  Your physician wants you to follow-up in: 6 weeks

## 2015-05-31 NOTE — Assessment & Plan Note (Signed)
Appears relatively euvolemic on today's visit, doing well on torsemide 20 g daily Sometimes takes 10 mg, always takes potassium

## 2015-05-31 NOTE — Assessment & Plan Note (Signed)
she has declined anticoagulation in the past Currently on low-dose amiodarone, metoprolol. No symptoms

## 2015-05-31 NOTE — Assessment & Plan Note (Signed)
Recommended that she increase her lactulose if bowel movements are not soft

## 2015-05-31 NOTE — Progress Notes (Signed)
Patient ID: Kathryn Short, female    DOB: November 24, 1921, 81 y.o.   MRN: [id number]  HPI Comments: [name] is an 81 year old woman with paroxysmal atrial fibrillation (last episode in March [id number]), diastolic CHF on a low-dose diuretic daily, labile hypertension,  sick sinus syndrome, pacemaker placement followed by Dr. Caryl Comes,  who presented to the hospital [facility] on 02/16/2012 in atrial fibrillation. She presents today for routine followup for her blood pressure and weight gain him a leg edema. Her past surgical history includes open cholecystectomy, partial colon resection and colostomy, appendectomy, hysterectomy, oophorectomy, GYN malignancy treated with radiation in the 1950s Recent bowel obstruction December 2015 She presents for routine follow-up of her blood pressure  In follow-up today, daughter reports that blood pressures typically mildly elevated in the morning, comes down quickly Possibly more stressed in the morning, feels more comfortable after breakfast She has been giving torsemide 20 mg for 5 days in a row, no leg edema Typically will alternate 20 minute grams with 10 mg Denies having any shortness of breath She is concerned as she is having more constipation despite taking lactulose daily No recent falls  EKG shows paced rhythm at 64 bpm  Other past medical history  in the hospital 05/25/2014, with small bowel obstruction. She was placed on bowel rest, IV fluids and  she passed her bowels. She did not require surgery  Echocardiogram done in the hospital 05/25/2014 showing ejection fraction greater than 55%, moderate pulmonary hypertension, mild to moderate TR  Earlier in 2015, she had atrial tachycardia and pacer was tracking the atrial rhythm,  Her device was reprogrammed in the office for DDI  She was continued on her beta blocker and amiodarone and rhythm broke, converting back to normal sinus rhythm. Pacemaker has been since changed back to her prior settings   She is  reluctant to use a walker, still uses a cane. Anticoagulation  discussed previouslywith the patient and her daughter. They are not interested. They preferred to stay on aspirin  history of falls with trauma requiring stitches. None recently Patient and daughter are afraid of her fall risk, other complications from anticoagulation. Currently not on anticoagulation at their request. They are aware of risks and benefits associated with arrhythmia and stroke.  Previous lab work; Total cholesterol 189, LDL 104, hematocrit 38, creatinine 0.8  previous visits to the  emergency room with chest pain, dizziness, malaise. She had a problem on isosorbide in the past. Feelings of insomnia, and anxious feeling, chest pain episodes at night that wake her from sleep, weakness, nausea, dizziness. She has obstructive sleep apnea and does not use her BiPAP at nighttime  Echocardiogram in the hospital showed ejection fraction 50-55%, moderate LVH, mild to moderate TR, right ventricular systolic pressure 40 mm of mercury, mild to moderate aortic valve insufficiency   Allergies  Allergen Reactions  . Ace Inhibitors   . Ambien [Zolpidem Tartrate]   . Bactrim [Sulfamethoxazole-Trimethoprim]   . Digoxin And Related   . Hctz [Hydrochlorothiazide]   . Lunesta [Eszopiclone]     Outpatient Encounter Prescriptions as of 05/31/2015  Medication Sig  . acetaminophen (TYLENOL) 500 MG tablet as needed. Takes 2-4 tablets daily.  Marland Kitchen albuterol (PROVENTIL HFA;VENTOLIN HFA) 108 (90 BASE) MCG/ACT inhaler Inhale into the lungs every 6 (six) hours as needed for wheezing or shortness of breath.  Marland Kitchen amiodarone (PACERONE) 200 MG tablet Take 0.5 tablets (100 mg total) by mouth daily.  Marland Kitchen aspirin 81 MG tablet Take 162 mg by mouth  daily.  . b complex vitamins tablet Take 1 tablet by mouth daily.  . cloNIDine (CATAPRES) 0.1 MG tablet Take 1 tablet (0.1 mg total) by mouth 3 (three) times daily as needed.  Marland Kitchen dextromethorphan-guaiFENesin  (MUCINEX DM) 30-600 MG 12hr tablet Take 1 tablet by mouth daily.  Marland Kitchen lactulose (CHRONULAC) 10 GM/15ML solution Take 30 g by mouth daily.   Marland Kitchen levothyroxine (SYNTHROID, LEVOTHROID) 50 MCG tablet Take 50 mcg by mouth daily.   Marland Kitchen losartan (COZAAR) 100 MG tablet Take 1 tablet (100 mg total) by mouth daily.  . metoprolol (LOPRESSOR) 50 MG tablet Take 1 tablet (50 mg total) by mouth 2 (two) times daily.  . montelukast (SINGULAIR) 10 MG tablet Take 10 mg by mouth at bedtime.  . Multiple Vitamin (MULTIVITAMIN) tablet Take 1 tablet by mouth daily.  . nitroGLYCERIN (NITROSTAT) 0.4 MG SL tablet Place 1 tablet (0.4 mg total) under the tongue every 5 (five) minutes as needed for chest pain.  . potassium chloride (K-DUR,KLOR-CON) 10 MEQ tablet Take 1 tablet (10 mEq total) by mouth daily as needed. (Patient taking differently: Take 5-10 mEq by mouth daily as needed. )  . Probiotic Product (PROBIOTIC DAILY PO) Take by mouth daily.  Marland Kitchen torsemide (DEMADEX) 20 MG tablet Take 1 tablet (20 mg total) by mouth daily as needed.  . [DISCONTINUED] dicyclomine (BENTYL) 10 MG capsule Take 10 mg by mouth daily. Reported on 05/31/2015  . [DISCONTINUED] nitrofurantoin, macrocrystal-monohydrate, (MACROBID) 100 MG capsule Take 100 mg by mouth 2 (two) times daily as needed. Reported on 05/31/2015  . [DISCONTINUED] potassium chloride (K-DUR) 10 MEQ tablet Reported on 05/31/2015   No facility-administered encounter medications on file as of 05/31/2015.    Past Medical History  Diagnosis Date  . Atrial fibrillation ([place])   . Hypertension   . OSA (obstructive sleep apnea)     On BiPAP  . GERD (gastroesophageal reflux disease)   . Hypothyroidism     Treated  . Uterine cancer ([place])     s/p radiation therapy in 1952  . Melanoma ([place])     Resected from back  . Fall   . Bowel obstruction [facility])     Past Surgical History  Procedure Laterality Date  . Total abdominal hysterectomy w/ bilateral salpingoophorectomy    . Colostomy       For perforation  . Cholecystectomy    . Cytoscopy    . Insert / replace / remove pacemaker      Implantation-Medtronic  . Skin biopsy      face    Social History  reports that she has never smoked. She does not have any smokeless tobacco history on file. She reports that she does not drink alcohol or use illicit drugs.  Family History Family history is unknown by patient.  Review of Systems  Constitutional: Negative.   Respiratory: Negative.   Cardiovascular: Negative.   Gastrointestinal: Negative.   Musculoskeletal: Positive for gait problem.  Neurological: Negative.   All other systems reviewed and are negative.   BP 140/70 mmHg  Pulse 64  Ht 5\' 2"  (1.575 m)  Wt 162 lb (73.483 kg)  BMI 29.62 kg/m2  Physical Exam  Constitutional: She is oriented to person, place, and time. She appears well-developed and well-nourished.  HENT:  Head: Normocephalic.  Nose: Nose normal.  Mouth/Throat: Oropharynx is clear and moist.  Eyes: Conjunctivae are normal. Pupils are equal, round, and reactive to light.  Neck: Normal range of motion. Neck supple. No JVD present.  Cardiovascular:  Normal rate, regular rhythm, S1 normal, S2 normal, normal heart sounds and intact distal pulses.  Exam reveals no gallop and no friction rub.   No murmur heard. No significant edema   Pulmonary/Chest: Effort normal and breath sounds normal. No respiratory distress. She has no wheezes. She has no rales. She exhibits no tenderness.  Abdominal: Soft. Bowel sounds are normal. She exhibits no distension. There is no tenderness.  Musculoskeletal: Normal range of motion. She exhibits no edema or tenderness.  Lymphadenopathy:    She has no cervical adenopathy.  Neurological: She is alert and oriented to person, place, and time. Coordination normal.  Skin: Skin is warm and dry. No rash noted. No erythema.  Psychiatric: She has a normal mood and affect. Her behavior is normal. Judgment and thought content  normal.    Assessment and Plan  Nursing note and vitals reviewed.

## 2015-06-17 DIAGNOSIS — I10 Essential (primary) hypertension: Secondary | ICD-10-CM | POA: Diagnosis not present

## 2015-06-17 DIAGNOSIS — R05 Cough: Secondary | ICD-10-CM | POA: Diagnosis not present

## 2015-06-17 DIAGNOSIS — R0602 Shortness of breath: Secondary | ICD-10-CM | POA: Diagnosis not present

## 2015-06-17 DIAGNOSIS — I480 Paroxysmal atrial fibrillation: Secondary | ICD-10-CM | POA: Diagnosis not present

## 2015-06-17 DIAGNOSIS — K219 Gastro-esophageal reflux disease without esophagitis: Secondary | ICD-10-CM | POA: Diagnosis not present

## 2015-06-17 DIAGNOSIS — R413 Other amnesia: Secondary | ICD-10-CM | POA: Diagnosis not present

## 2015-06-23 ENCOUNTER — Telehealth: Payer: Self-pay

## 2015-06-23 NOTE — Telephone Encounter (Signed)
Spoke w/ pt's daughter.  She reports that pt started a Z-pak 2 days ago for chest congestion and has had elevated HR since that time. Reports her HR is typically b/t 50-69 and it has been in the 90s for the past 2 days.  Pt feels good overall, no increased SOB. Has 1 more day of abx tx. Advised her to finish z-pak, continue to monitor BP & HR and call back on Monday if HR does not come back down.  She verbalizes understanding and is agreeable.

## 2015-06-23 NOTE — Telephone Encounter (Signed)
Pt daughter called, states pt has been taking a Zpak, states her HR is elevated to 95. Her BP is 122/66, HR 93 at 9 pm last night. This am.9:00 136/82, HR 95. She thinks the elevated BP and HR is from the [place]. Please call.

## 2015-06-24 ENCOUNTER — Other Ambulatory Visit: Payer: Self-pay | Admitting: Internal Medicine

## 2015-06-24 DIAGNOSIS — R7989 Other specified abnormal findings of blood chemistry: Secondary | ICD-10-CM

## 2015-06-24 DIAGNOSIS — R945 Abnormal results of liver function studies: Principal | ICD-10-CM

## 2015-06-28 ENCOUNTER — Ambulatory Visit
Admission: RE | Admit: 2015-06-28 | Discharge: 2015-06-28 | Disposition: A | Discharge status: Home / Self Care | Payer: Commercial Managed Care - HMO | Source: Ambulatory Visit | Attending: Internal Medicine | Admitting: Internal Medicine

## 2015-06-28 DIAGNOSIS — R7989 Other specified abnormal findings of blood chemistry: Secondary | ICD-10-CM | POA: Insufficient documentation

## 2015-06-28 DIAGNOSIS — R945 Abnormal results of liver function studies: Secondary | ICD-10-CM

## 2015-06-28 DIAGNOSIS — Z9049 Acquired absence of other specified parts of digestive tract: Secondary | ICD-10-CM | POA: Diagnosis not present

## 2015-07-05 ENCOUNTER — Ambulatory Visit (INDEPENDENT_AMBULATORY_CARE_PROVIDER_SITE_OTHER): Payer: Commercial Managed Care - HMO | Admitting: Internal Medicine

## 2015-07-05 ENCOUNTER — Encounter: Payer: Self-pay | Admitting: Internal Medicine

## 2015-07-05 VITALS — BP 161/82 | HR 62 | Ht 62.0 in | Wt 165.8 lb

## 2015-07-05 DIAGNOSIS — I471 Supraventricular tachycardia: Secondary | ICD-10-CM | POA: Diagnosis not present

## 2015-07-05 DIAGNOSIS — I5033 Acute on chronic diastolic (congestive) heart failure: Secondary | ICD-10-CM

## 2015-07-05 DIAGNOSIS — I480 Paroxysmal atrial fibrillation: Secondary | ICD-10-CM | POA: Diagnosis not present

## 2015-07-05 DIAGNOSIS — I10 Essential (primary) hypertension: Secondary | ICD-10-CM

## 2015-07-05 DIAGNOSIS — E038 Other specified hypothyroidism: Secondary | ICD-10-CM

## 2015-07-05 LAB — CUP PACEART INCLINIC DEVICE CHECK
Battery Remaining Longevity: 4 mo
Brady Statistic AP VP Percent: 0 %
Brady Statistic AS VS Percent: 0 %
Implantable Lead Implant Date: 20080618
Implantable Lead Location: 753859
Lead Channel Pacing Threshold Amplitude: 1.25 V
Lead Channel Sensing Intrinsic Amplitude: 8 mV
Lead Channel Setting Pacing Amplitude: 2 V
Lead Channel Setting Pacing Pulse Width: 0.4 ms
MDC IDC LEAD IMPLANT DT: 20080618
MDC IDC LEAD LOCATION: 753860
MDC IDC MSMT BATTERY IMPEDANCE: 4344 Ohm
MDC IDC MSMT BATTERY VOLTAGE: 2.65 V
MDC IDC MSMT LEADCHNL RA IMPEDANCE VALUE: 379 Ohm
MDC IDC MSMT LEADCHNL RA PACING THRESHOLD AMPLITUDE: 1 V
MDC IDC MSMT LEADCHNL RA PACING THRESHOLD PULSEWIDTH: 0.4 ms
MDC IDC MSMT LEADCHNL RV IMPEDANCE VALUE: 425 Ohm
MDC IDC MSMT LEADCHNL RV PACING THRESHOLD PULSEWIDTH: 0.4 ms
MDC IDC SESS DTM: 20170124183054
MDC IDC SET LEADCHNL RV PACING AMPLITUDE: 2.5 V
MDC IDC SET LEADCHNL RV SENSING SENSITIVITY: 2.8 mV
MDC IDC STAT BRADY AP VS PERCENT: 99 %
MDC IDC STAT BRADY AS VP PERCENT: 0 %

## 2015-07-05 NOTE — Patient Instructions (Signed)
Medication Instructions: - no changes  Labwork: - none  Procedures/Testing: - none  Follow-Up: - Your physician recommends that you schedule a follow-up appointment in: Tuesday 08/16/15 at 9:00 am with the device clinic- battery check only  Any Additional Special Instructions Will Be Listed Below (If Applicable).     If you need a refill on your cardiac medications before your next appointment, please call your pharmacy.

## 2015-07-05 NOTE — Progress Notes (Signed)
Patient Care Team: Tracie Harrier, MD as PCP - General (Internal Medicine) Minna Merritts, MD as Consulting Physician (Cardiology)   HPI  Kathryn Short is a 82 y.o. female seen in followup for atrial fibrillation and bradycardia with prior pacemaker implantation.  She was previously on warfarin but because of falls , has been taking aspirin.   Shehas had symptomatic atrial tach and hypertension   2014 Echo at [facility] >>ejection fraction 50-55%, moderate LVH, mild to moderate TR, right ventricular systolic pressure 40 mm of mercury, mild to moderate aortic valve insufficiency  She has no history of coronary disease. She recalls having had a stress test at [facility]; we are unable to find records.   She has been doing relatively well but is having more problems with DOE despite that her edema is better She denies chest pain   Past Medical History  Diagnosis Date  . Atrial fibrillation ([place])   . Hypertension   . OSA (obstructive sleep apnea)     On BiPAP  . GERD (gastroesophageal reflux disease)   . Hypothyroidism     Treated  . Uterine cancer ([place])     s/p radiation therapy in 1952  . Melanoma ([place])     Resected from back  . Fall   . Bowel obstruction [facility])     Past Surgical History  Procedure Laterality Date  . Total abdominal hysterectomy w/ bilateral salpingoophorectomy    . Colostomy      For perforation  . Cholecystectomy    . Cytoscopy    . Insert / replace / remove pacemaker      Implantation-Medtronic  . Skin biopsy      face    Current Outpatient Prescriptions  Medication Sig Dispense Refill  . acetaminophen (TYLENOL) 500 MG tablet Takes 2-4 tablets daily.    Marland Kitchen albuterol (PROVENTIL HFA;VENTOLIN HFA) 108 (90 BASE) MCG/ACT inhaler Inhale into the lungs every 6 (six) hours as needed for wheezing or shortness of breath.    Marland Kitchen amiodarone (PACERONE) 200 MG tablet Take 0.5 tablets (100 mg total) by mouth daily. 45 tablet 3  . aspirin 81 MG tablet Take 162 mg by mouth  daily.    Marland Kitchen b complex vitamins tablet Take 1 tablet by mouth daily.    . cloNIDine (CATAPRES) 0.1 MG tablet Take 1 tablet (0.1 mg total) by mouth 3 (three) times daily as needed. 270 tablet 3  . dextromethorphan-guaiFENesin (MUCINEX DM) 30-600 MG 12hr tablet Take 1 tablet by mouth daily.    Marland Kitchen donepezil (ARICEPT) 5 MG tablet Take 5 mg by mouth daily.    Marland Kitchen lactulose (CHRONULAC) 10 GM/15ML solution Take 30 g by mouth daily.     Marland Kitchen levothyroxine (SYNTHROID, LEVOTHROID) 50 MCG tablet Take 50 mcg by mouth daily.     Marland Kitchen losartan (COZAAR) 100 MG tablet Take 1 tablet (100 mg total) by mouth daily. 90 tablet 3  . metoprolol (LOPRESSOR) 50 MG tablet Take 1 tablet (50 mg total) by mouth 2 (two) times daily. 180 tablet 3  . montelukast (SINGULAIR) 10 MG tablet Take 10 mg by mouth at bedtime.    . Multiple Vitamin (MULTIVITAMIN) tablet Take 1 tablet by mouth daily.    . nitroGLYCERIN (NITROSTAT) 0.4 MG SL tablet Place 1 tablet (0.4 mg total) under the tongue every 5 (five) minutes as needed for chest pain. 25 tablet 11  . potassium chloride (K-DUR,KLOR-CON) 10 MEQ tablet Take 1 tablet (10 mEq total) by mouth daily as needed. (Patient taking  differently: Take 5-10 mEq by mouth daily as needed (for fluid). ) 90 tablet 3  . Probiotic Product (PROBIOTIC DAILY PO) Take by mouth daily.    Marland Kitchen torsemide (DEMADEX) 20 MG tablet Take 1 tablet (20 mg total) by mouth daily as needed. 90 tablet 3   No current facility-administered medications for this visit.    Allergies  Allergen Reactions  . Ace Inhibitors   . Ambien [Zolpidem Tartrate]   . Bactrim [Sulfamethoxazole-Trimethoprim]   . Digoxin And Related   . Hctz [Hydrochlorothiazide]   . Lunesta [Eszopiclone]     Review of Systems negative except from HPI and PMH  Physical Exam BP 161/82 mmHg  Pulse 62  Ht 5\' 2"  (1.575 m)  Wt 165 lb 12.8 oz (75.206 kg)  BMI 30.32 kg/m2 Well developed and well nourished in no acute distress Well developed and nourished in no  acute distress HENT normal Neck supple with JVP-<8 Clear Regular rate and rhythm, no murmurs or gallops Abd-soft with active BS No Clubbing cyanosis 1+edema  Wearing compression stockings Skin-warm and dry A & Oriented  Grossly normal sensory and motor function     Assessment and  Plan  Hypertension  Atrial fibrillation  Sinus node dysfunction-symptomatic     Atrial tach non intercurrently   Pacemaker-Medtronic The patient's device was interrogated.  The information was reviewed.   Approaching ERI  explained anticipated changes with mode reversion    There is no intercurrent atrial fibrillation. We will continue her on  lower dose amio  Surveillance labs from [facility] were stable albeit mildly abnormal   TSH mildly elevated on synthroid-- will congtact PCP re uptitration  Mild volume overload,  Will increase diuretics for 5 days  BP elevated  Hopefully will decreae with diuretics  Not on anticoagulation per PCards 2/2 hx of falls

## 2015-07-18 ENCOUNTER — Encounter: Payer: Self-pay | Admitting: Internal Medicine

## 2015-07-22 ENCOUNTER — Ambulatory Visit: Payer: Commercial Managed Care - HMO | Admitting: Cardiovascular Disease

## 2015-07-26 ENCOUNTER — Encounter: Payer: Commercial Managed Care - HMO | Admitting: Internal Medicine

## 2015-07-26 ENCOUNTER — Ambulatory Visit: Payer: Commercial Managed Care - HMO | Admitting: Cardiovascular Disease

## 2015-07-28 ENCOUNTER — Ambulatory Visit: Payer: Commercial Managed Care - HMO | Admitting: Cardiovascular Disease

## 2015-08-01 ENCOUNTER — Encounter: Payer: Commercial Managed Care - HMO | Admitting: Internal Medicine

## 2015-08-04 DIAGNOSIS — E079 Disorder of thyroid, unspecified: Secondary | ICD-10-CM | POA: Diagnosis not present

## 2015-08-04 DIAGNOSIS — I10 Essential (primary) hypertension: Secondary | ICD-10-CM | POA: Diagnosis not present

## 2015-08-04 DIAGNOSIS — R413 Other amnesia: Secondary | ICD-10-CM | POA: Diagnosis not present

## 2015-08-04 DIAGNOSIS — I480 Paroxysmal atrial fibrillation: Secondary | ICD-10-CM | POA: Diagnosis not present

## 2015-08-04 DIAGNOSIS — K219 Gastro-esophageal reflux disease without esophagitis: Secondary | ICD-10-CM | POA: Diagnosis not present

## 2015-08-04 DIAGNOSIS — R05 Cough: Secondary | ICD-10-CM | POA: Diagnosis not present

## 2015-08-29 DIAGNOSIS — X32XXXA Exposure to sunlight, initial encounter: Secondary | ICD-10-CM | POA: Diagnosis not present

## 2015-08-29 DIAGNOSIS — Z08 Encounter for follow-up examination after completed treatment for malignant neoplasm: Secondary | ICD-10-CM | POA: Diagnosis not present

## 2015-08-29 DIAGNOSIS — L821 Other seborrheic keratosis: Secondary | ICD-10-CM | POA: Diagnosis not present

## 2015-08-29 DIAGNOSIS — L570 Actinic keratosis: Secondary | ICD-10-CM | POA: Diagnosis not present

## 2015-08-29 DIAGNOSIS — Z85828 Personal history of other malignant neoplasm of skin: Secondary | ICD-10-CM | POA: Diagnosis not present

## 2015-08-30 ENCOUNTER — Encounter: Payer: Self-pay | Admitting: *Deleted

## 2015-08-30 ENCOUNTER — Ambulatory Visit (INDEPENDENT_AMBULATORY_CARE_PROVIDER_SITE_OTHER): Payer: Commercial Managed Care - HMO | Admitting: Internal Medicine

## 2015-08-30 VITALS — BP 122/62 | HR 65 | Resp 16

## 2015-08-30 DIAGNOSIS — R001 Bradycardia, unspecified: Secondary | ICD-10-CM | POA: Diagnosis not present

## 2015-08-30 DIAGNOSIS — Z45010 Encounter for checking and testing of cardiac pacemaker pulse generator [battery]: Secondary | ICD-10-CM | POA: Diagnosis not present

## 2015-08-30 DIAGNOSIS — Z01812 Encounter for preprocedural laboratory examination: Secondary | ICD-10-CM

## 2015-08-30 NOTE — Patient Instructions (Signed)
Medication Instructions: - Your physician recommends that you continue on your current medications as directed. Please refer to the Current Medication list given to you today.  Labwork: - Your physician recommends that you have lab work today: BMP/ CBC/ INR  Procedures/Testing: - Your physician has recommended that you have a pacemaker generator (battery) change. Please see the instruction sheet given to you today for more information.  Follow-Up: - Your physician recommends that you schedule a follow-up appointment in: 10-14 days (from 09/07/15) with the [facility] in [place] for a wound check   Any Additional Special Instructions Will Be Listed Below (If Applicable).     If you need a refill on your cardiac medications before your next appointment, please call your pharmacy.

## 2015-08-30 NOTE — Progress Notes (Signed)
Patient Care Team: Tracie Harrier, MD as PCP - General (Internal Medicine) Minna Merritts, MD as Consulting Physician (Cardiology)   HPI  Kathryn Short is a 82 y.o. female seen in followup for atrial fibrillation and sinus bradycardia with prior pacemaker implantation.  She was previously on warfarin but because of falls , has been taking aspirin. This decison per primary Cardiology.  Her pacemaker has reached ERI and has reverted to VVI;  She has hx of underlying sinus bradycardia with 100% Ap 0%Vp    She has had symptomatic atrial tach and hypertension   2014 Echo at [facility] >>ejection fraction 50-55%, moderate LVH, mild to moderate TR, right ventricular systolic pressure 40 mm of mercury, mild to moderate aortic valve insufficiency  She has no history of coronary disease. She recalls having had a stress test at [facility]; we are unable to find records.    Past Medical History  Diagnosis Date  . Atrial fibrillation ([place])   . Hypertension   . OSA (obstructive sleep apnea)     On BiPAP  . GERD (gastroesophageal reflux disease)   . Hypothyroidism     Treated  . Uterine cancer ([place])     s/p radiation therapy in 1952  . Melanoma ([place])     Resected from back  . Fall   . Bowel obstruction [facility])     Past Surgical History  Procedure Laterality Date  . Total abdominal hysterectomy w/ bilateral salpingoophorectomy    . Colostomy      For perforation  . Cholecystectomy    . Cytoscopy    . Insert / replace / remove pacemaker      Implantation-Medtronic  . Skin biopsy      face    Current Outpatient Prescriptions  Medication Sig Dispense Refill  . acetaminophen (TYLENOL) 500 MG tablet Takes 2-4 tablets daily.    Marland Kitchen albuterol (PROVENTIL HFA;VENTOLIN HFA) 108 (90 BASE) MCG/ACT inhaler Inhale into the lungs every 6 (six) hours as needed for wheezing or shortness of breath.    Marland Kitchen amiodarone (PACERONE) 200 MG tablet Take 0.5 tablets (100 mg total) by mouth daily. 45 tablet 3  .  aspirin 81 MG tablet Take 162 mg by mouth daily.    Marland Kitchen b complex vitamins tablet Take 1 tablet by mouth daily.    . cloNIDine (CATAPRES) 0.1 MG tablet Take 1 tablet (0.1 mg total) by mouth 3 (three) times daily as needed. 270 tablet 3  . dextromethorphan-guaiFENesin (MUCINEX DM) 30-600 MG 12hr tablet Take 1 tablet by mouth daily.    Marland Kitchen donepezil (ARICEPT) 5 MG tablet Take 5 mg by mouth daily.    Marland Kitchen lactulose (CHRONULAC) 10 GM/15ML solution Take 30 g by mouth daily.     Marland Kitchen levothyroxine (SYNTHROID, LEVOTHROID) 50 MCG tablet Take 50 mcg by mouth daily.     Marland Kitchen losartan (COZAAR) 100 MG tablet Take 1 tablet (100 mg total) by mouth daily. 90 tablet 3  . metoprolol (LOPRESSOR) 50 MG tablet Take 1 tablet (50 mg total) by mouth 2 (two) times daily. 180 tablet 3  . montelukast (SINGULAIR) 10 MG tablet Take 10 mg by mouth at bedtime.    . Multiple Vitamin (MULTIVITAMIN) tablet Take 1 tablet by mouth daily.    . nitroGLYCERIN (NITROSTAT) 0.4 MG SL tablet Place 1 tablet (0.4 mg total) under the tongue every 5 (five) minutes as needed for chest pain. 25 tablet 11  . potassium chloride (K-DUR,KLOR-CON) 10 MEQ tablet Take 1 tablet (10 mEq total)  by mouth daily as needed. (Patient taking differently: Take 5-10 mEq by mouth daily as needed (for fluid). ) 90 tablet 3  . Probiotic Product (PROBIOTIC DAILY PO) Take by mouth daily.    Marland Kitchen torsemide (DEMADEX) 20 MG tablet Take 1 tablet (20 mg total) by mouth daily as needed. (Patient taking differently: Take 10-20 mg by mouth daily. Alternate 0.5 tab and 1 tab every other day, unless fluid overloaded, then increase to 1 tab daily for 5 days) 90 tablet 3   No current facility-administered medications for this visit.    Allergies  Allergen Reactions  . Ace Inhibitors   . Ambien [Zolpidem Tartrate]   . Bactrim [Sulfamethoxazole-Trimethoprim]   . Digoxin And Related   . Hctz [Hydrochlorothiazide]   . Lunesta [Eszopiclone]     Review of Systems negative except from HPI  and PMH  Physical Exam BP 122/62 mmHg  Pulse 65  Resp 16 Well developed and well nourished in no acute distress Well developed and nourished in no acute distress HENT normal Neck supple with JVP-<8 Clear Pocket caudally displaced, they say i am responsible for this mess. Regular rate and rhythm, no murmurs or gallops Abd-soft with active BS No Clubbing cyanosis 1+edema  Wearing compression stockings Skin-warm and dry A & Oriented  Grossly normal sensory and motor function  ECG demonstrates ventricular pacing at 65 no discernible P wave is noted    Assessment and  Plan  Hypertension  Atrial fibrillation  Sinus node dysfunction-symptomatic     Atrial tach non intercurrently   Pacemaker-Medtronic The patient's device was interrogated.  The information was reviewed.   Device has reached ERI and has reverted.  She feels terrible with reversion.  We have reviewed the benefits and risks of generator replacement.  These include but are not limited to lead fracture and infection.  The patient understands, agrees and is willing to proceed.

## 2015-08-31 LAB — CBC WITH DIFFERENTIAL/PLATELET
BASOS ABS: 0.1 10*3/uL (ref 0.0–0.2)
Basos: 1 %
EOS (ABSOLUTE): 0.2 10*3/uL (ref 0.0–0.4)
Eos: 2 %
HEMOGLOBIN: 11.1 g/dL (ref 11.1–15.9)
Hematocrit: 33.1 % — ABNORMAL LOW (ref 34.0–46.6)
Immature Grans (Abs): 0 10*3/uL (ref 0.0–0.1)
Immature Granulocytes: 0 %
LYMPHS ABS: 1.3 10*3/uL (ref 0.7–3.1)
Lymphs: 18 %
MCH: 30.8 pg (ref 26.6–33.0)
MCHC: 33.5 g/dL (ref 31.5–35.7)
MCV: 92 fL (ref 79–97)
MONOCYTES: 7 %
Monocytes Absolute: 0.5 10*3/uL (ref 0.1–0.9)
NEUTROS ABS: 5.4 10*3/uL (ref 1.4–7.0)
Neutrophils: 72 %
PLATELETS: 252 10*3/uL (ref 150–379)
RBC: 3.6 x10E6/uL — ABNORMAL LOW (ref 3.77–5.28)
RDW: 14.4 % (ref 12.3–15.4)
WBC: 7.5 10*3/uL (ref 3.4–10.8)

## 2015-08-31 LAB — BASIC METABOLIC PANEL
BUN / CREAT RATIO: 19 (ref 11–26)
BUN: 18 mg/dL (ref 10–36)
CHLORIDE: 89 mmol/L — AB (ref 96–106)
CO2: 22 mmol/L (ref 18–29)
Calcium: 8.8 mg/dL (ref 8.7–10.3)
Creatinine, Ser: 0.93 mg/dL (ref 0.57–1.00)
GFR calc Af Amer: 62 mL/min/{1.73_m2} (ref >59)
GFR calc non Af Amer: 54 mL/min/{1.73_m2} — ABNORMAL LOW (ref >59)
GLUCOSE: 121 mg/dL — AB (ref 65–99)
POTASSIUM: 4.7 mmol/L (ref 3.5–5.2)
Sodium: 129 mmol/L — ABNORMAL LOW (ref 134–144)

## 2015-08-31 LAB — PROTIME-INR
INR: 1.1 (ref 0.8–1.2)
PROTHROMBIN TIME: 11.2 s (ref 9.1–12.0)

## 2015-09-02 ENCOUNTER — Ambulatory Visit (INDEPENDENT_AMBULATORY_CARE_PROVIDER_SITE_OTHER): Payer: Commercial Managed Care - HMO | Admitting: Cardiovascular Disease

## 2015-09-02 ENCOUNTER — Encounter: Payer: Self-pay | Admitting: Cardiovascular Disease

## 2015-09-02 VITALS — BP 123/63 | HR 63 | Ht 62.0 in | Wt 172.0 lb

## 2015-09-02 DIAGNOSIS — I5033 Acute on chronic diastolic (congestive) heart failure: Secondary | ICD-10-CM | POA: Diagnosis not present

## 2015-09-02 DIAGNOSIS — Z950 Presence of cardiac pacemaker: Secondary | ICD-10-CM

## 2015-09-02 DIAGNOSIS — I480 Paroxysmal atrial fibrillation: Secondary | ICD-10-CM | POA: Diagnosis not present

## 2015-09-02 DIAGNOSIS — R600 Localized edema: Secondary | ICD-10-CM | POA: Diagnosis not present

## 2015-09-02 NOTE — Assessment & Plan Note (Signed)
Significant edema on today's visit with 10 pound weight gain Plan as above

## 2015-09-02 NOTE — Progress Notes (Signed)
Patient ID: Kathryn Short, female    DOB: [birth year], 82 y.o.   MRN: [id number]  HPI Comments: Kathryn Short is an 82 year old woman with paroxysmal atrial fibrillation (last episode in March 123456), diastolic CHF on a low-dose diuretic daily, labile hypertension,  sick sinus syndrome, pacemaker placement followed by Dr. Caryl Comes,  who presented to the hospital [facility] on 02/16/2012 in atrial fibrillation. She presents today for routine followup for her blood pressure and weight gain him a leg edema. Her past surgical history includes open cholecystectomy, partial colon resection and colostomy, appendectomy, hysterectomy, oophorectomy, GYN malignancy treated with radiation in the 1950s Recent bowel obstruction December 2015 She presents for routine follow-up of her blood pressure and diastolic CHF  Kathryn Short presents today with 10 pound weight gain since her last clinic visit Worsening leg swelling, abdominal swelling, cough Pacemaker has reached EOL and she is scheduled for generator change next week, Past EKG shows VVI mode Daughter reports she is not feeling well, not moving well, she has been able to see a clear difference in her behavior. Daughter has been giving one half torsemide pills sometimes alternating with a full pill, made no adjustment for weight gain. Blood pressure has been relatively stable with numbers ranging from 123456 systolic up to [id number], on average in the 140 up to 150 range,  Other past medical history  in the hospital 05/25/2014, with small bowel obstruction. She was placed on bowel rest, IV fluids and  she passed her bowels. She did not require surgery  Echocardiogram done in the hospital 05/25/2014 showing ejection fraction greater than 55%, moderate pulmonary hypertension, mild to moderate TR  Earlier in 2015, she had atrial tachycardia and pacer was tracking the atrial rhythm,  Her device was reprogrammed in the office for DDI  She was continued on her beta blocker and  amiodarone and rhythm broke, converting back to normal sinus rhythm. Pacemaker has been since changed back to her prior settings   She is reluctant to use a walker, still uses a cane. Anticoagulation  discussed previouslywith the patient and her daughter. They are not interested. They preferred to stay on aspirin  history of falls with trauma requiring stitches. None recently Patient and daughter are afraid of her fall risk, other complications from anticoagulation. Currently not on anticoagulation at their request. They are aware of risks and benefits associated with arrhythmia and stroke.  Previous lab work; Total cholesterol 189, LDL 104, hematocrit 38, creatinine 0.8  previous visits to the  emergency room with chest pain, dizziness, malaise. She had a problem on isosorbide in the past. Feelings of insomnia, and anxious feeling, chest pain episodes at night that wake her from sleep, weakness, nausea, dizziness. She has obstructive sleep apnea and does not use her BiPAP at nighttime  Echocardiogram in the hospital showed ejection fraction 50-55%, moderate LVH, mild to moderate TR, right ventricular systolic pressure 40 mm of mercury, mild to moderate aortic valve insufficiency   Allergies  Allergen Reactions  . Ace Inhibitors   . Ambien [Zolpidem Tartrate]   . Bactrim [Sulfamethoxazole-Trimethoprim]   . Digoxin And Related   . Hctz [Hydrochlorothiazide]   . Lunesta [Eszopiclone]     Outpatient Encounter Prescriptions as of 09/02/2015  Medication Sig  . acetaminophen (TYLENOL) 500 MG tablet Takes 2-4 tablets daily.  Marland Kitchen albuterol (PROVENTIL HFA;VENTOLIN HFA) 108 (90 BASE) MCG/ACT inhaler Inhale into the lungs every 6 (six) hours as needed for wheezing or shortness of breath.  Marland Kitchen amiodarone (PACERONE) 200  MG tablet Take 0.5 tablets (100 mg total) by mouth daily.  Marland Kitchen aspirin 81 MG tablet Take 162 mg by mouth daily.  Marland Kitchen b complex vitamins tablet Take 1 tablet by mouth daily.  . cloNIDine  (CATAPRES) 0.1 MG tablet Take 1 tablet (0.1 mg total) by mouth 3 (three) times daily as needed.  Marland Kitchen dextromethorphan-guaiFENesin (MUCINEX DM) 30-600 MG 12hr tablet Take 1 tablet by mouth daily.  Marland Kitchen donepezil (ARICEPT) 5 MG tablet Take 5 mg by mouth daily.  Marland Kitchen lactulose (CHRONULAC) 10 GM/15ML solution Take 30 g by mouth daily.   Marland Kitchen levothyroxine (SYNTHROID, LEVOTHROID) 50 MCG tablet Take 50 mcg by mouth daily.   Marland Kitchen losartan (COZAAR) 100 MG tablet Take 1 tablet (100 mg total) by mouth daily.  . metoprolol (LOPRESSOR) 50 MG tablet Take 1 tablet (50 mg total) by mouth 2 (two) times daily.  . montelukast (SINGULAIR) 10 MG tablet Take 10 mg by mouth at bedtime.  . Multiple Vitamin (MULTIVITAMIN) tablet Take 1 tablet by mouth daily.  . nitroGLYCERIN (NITROSTAT) 0.4 MG SL tablet Place 1 tablet (0.4 mg total) under the tongue every 5 (five) minutes as needed for chest pain.  . potassium chloride (K-DUR,KLOR-CON) 10 MEQ tablet Take 1 tablet (10 mEq total) by mouth daily as needed. (Patient taking differently: Take 5-10 mEq by mouth daily as needed (for fluid). )  . Probiotic Product (PROBIOTIC DAILY PO) Take by mouth daily.  Marland Kitchen torsemide (DEMADEX) 20 MG tablet Take 1 tablet (20 mg total) by mouth daily as needed. (Patient taking differently: Take 10-20 mg by mouth daily. Alternate 0.5 tab and 1 tab every other day, unless fluid overloaded, then increase to 1 tab daily for 5 days)   No facility-administered encounter medications on file as of 09/02/2015.    Past Medical History  Diagnosis Date  . Atrial fibrillation ([place])   . Hypertension   . OSA (obstructive sleep apnea)     On BiPAP  . GERD (gastroesophageal reflux disease)   . Hypothyroidism     Treated  . Uterine cancer ([place])     s/p radiation therapy in 1952  . Melanoma ([place])     Resected from back  . Fall   . Bowel obstruction [facility])     Past Surgical History  Procedure Laterality Date  . Total abdominal hysterectomy w/ bilateral  salpingoophorectomy    . Colostomy      For perforation  . Cholecystectomy    . Cytoscopy    . Insert / replace / remove pacemaker      Implantation-Medtronic  . Skin biopsy      face    Social History  reports that she has never smoked. She does not have any smokeless tobacco history on file. She reports that she does not drink alcohol or use illicit drugs.  Family History Family history is unknown by patient.  Review of Systems  Constitutional: Positive for unexpected weight change.  Respiratory: Positive for shortness of breath.   Cardiovascular: Positive for leg swelling.  Gastrointestinal: Negative.   Musculoskeletal: Positive for gait problem.  Neurological: Negative.   All other systems reviewed and are negative.   BP 123/63 mmHg  Pulse 63  Ht 5\' 2"  (1.575 m)  Wt 172 lb (78.019 kg)  BMI 31.45 kg/m2  Physical Exam  Constitutional: She is oriented to person, place, and time. She appears well-developed and well-nourished.  HENT:  Head: Normocephalic.  Nose: Nose normal.  Mouth/Throat: Oropharynx is clear and moist.  Eyes:  Conjunctivae are normal. Pupils are equal, round, and reactive to light.  Neck: Normal range of motion. Neck supple. No JVD present.  Cardiovascular: Normal rate, regular rhythm, S1 normal, S2 normal, normal heart sounds and intact distal pulses.  Exam reveals no gallop and no friction rub.   No murmur heard. 1+ pitting edema to the thighs  Pulmonary/Chest: Effort normal. No respiratory distress. She has no wheezes. She has rales. She exhibits no tenderness.  Abdominal: Soft. Bowel sounds are normal. She exhibits no distension. There is no tenderness.  Musculoskeletal: Normal range of motion. She exhibits no edema or tenderness.  Lymphadenopathy:    She has no cervical adenopathy.  Neurological: She is alert and oriented to person, place, and time. Coordination normal.  Skin: Skin is warm and dry. No rash noted. No erythema.  Psychiatric: She  has a normal mood and affect. Her behavior is normal. Judgment and thought content normal.    Assessment and Plan  Nursing note and vitals reviewed.

## 2015-09-02 NOTE — Assessment & Plan Note (Signed)
Maintaining normal sinus rhythm, she has previously declined anticoagulation

## 2015-09-02 NOTE — Patient Instructions (Signed)
weight is up 10 pounds from her baseline  Please increase the torsemide to one pill twice a day with potassium twice a day for 3 days  Then down to a full pill daily with potassium  Please call us if you have new issues that need to be addressed before your next appt.  Your physician wants you to follow-up in: 1 month.

## 2015-09-02 NOTE — Assessment & Plan Note (Signed)
10 pound weight gain likely secondary to pacemaker setting changes, now in VVI Recommended she take torsemide 20 g twice a day for the next 3 days Reevaluate leg edema at that time, take at least torsemide 20 g daily if not twice a day on Tuesday, March 28. She will hold torsemide on March 29 in preparation for generator change Recommended she take at least torsemide 20 mg daily following the procedure

## 2015-09-02 NOTE — Assessment & Plan Note (Signed)
Generator change scheduled for next week Discussed location of hospital and where they need to go.   Total encounter time more than 25 minutes  Greater than 50% was spent in counseling and coordination of care with the patient

## 2015-09-06 ENCOUNTER — Encounter: Payer: Self-pay | Admitting: Internal Medicine

## 2015-09-07 ENCOUNTER — Ambulatory Visit (HOSPITAL_COMMUNITY)
Admission: RE | Admit: 2015-09-07 | Discharge: 2015-09-07 | Disposition: A | Discharge status: Home / Self Care | Payer: Commercial Managed Care - HMO | Source: Ambulatory Visit | Attending: Internal Medicine | Admitting: Internal Medicine

## 2015-09-07 ENCOUNTER — Encounter (HOSPITAL_COMMUNITY)
Admission: RE | Disposition: A | Discharge status: Home / Self Care | Payer: Self-pay | Source: Ambulatory Visit | Attending: Internal Medicine

## 2015-09-07 DIAGNOSIS — Z85820 Personal history of malignant melanoma of skin: Secondary | ICD-10-CM | POA: Insufficient documentation

## 2015-09-07 DIAGNOSIS — Z7982 Long term (current) use of aspirin: Secondary | ICD-10-CM | POA: Diagnosis not present

## 2015-09-07 DIAGNOSIS — K219 Gastro-esophageal reflux disease without esophagitis: Secondary | ICD-10-CM | POA: Insufficient documentation

## 2015-09-07 DIAGNOSIS — I495 Sick sinus syndrome: Secondary | ICD-10-CM | POA: Diagnosis present

## 2015-09-07 DIAGNOSIS — I498 Other specified cardiac arrhythmias: Secondary | ICD-10-CM | POA: Diagnosis present

## 2015-09-07 DIAGNOSIS — G4733 Obstructive sleep apnea (adult) (pediatric): Secondary | ICD-10-CM | POA: Insufficient documentation

## 2015-09-07 DIAGNOSIS — R001 Bradycardia, unspecified: Secondary | ICD-10-CM

## 2015-09-07 DIAGNOSIS — Z45010 Encounter for checking and testing of cardiac pacemaker pulse generator [battery]: Secondary | ICD-10-CM | POA: Diagnosis not present

## 2015-09-07 DIAGNOSIS — E039 Hypothyroidism, unspecified: Secondary | ICD-10-CM | POA: Insufficient documentation

## 2015-09-07 DIAGNOSIS — I351 Nonrheumatic aortic (valve) insufficiency: Secondary | ICD-10-CM | POA: Diagnosis not present

## 2015-09-07 DIAGNOSIS — Z8542 Personal history of malignant neoplasm of other parts of uterus: Secondary | ICD-10-CM | POA: Insufficient documentation

## 2015-09-07 DIAGNOSIS — I4891 Unspecified atrial fibrillation: Secondary | ICD-10-CM | POA: Diagnosis not present

## 2015-09-07 DIAGNOSIS — I10 Essential (primary) hypertension: Secondary | ICD-10-CM | POA: Insufficient documentation

## 2015-09-07 DIAGNOSIS — Z950 Presence of cardiac pacemaker: Secondary | ICD-10-CM | POA: Diagnosis present

## 2015-09-07 HISTORY — PX: EP IMPLANTABLE DEVICE: SHX172B

## 2015-09-07 LAB — SURGICAL PCR SCREEN
MRSA, PCR: NEGATIVE
STAPHYLOCOCCUS AUREUS: NEGATIVE

## 2015-09-07 SURGERY — PPM/BIV PPM GENERATOR CHANGEOUT

## 2015-09-07 MED ORDER — SODIUM CHLORIDE 0.9 % IR SOLN
Status: AC
  Filled 2015-09-07: qty 2

## 2015-09-07 MED ORDER — MUPIROCIN 2 % EX OINT
TOPICAL_OINTMENT | CUTANEOUS | Status: AC
  Administered 2015-09-07: 13:00:00
  Filled 2015-09-07: qty 22

## 2015-09-07 MED ORDER — CEFAZOLIN SODIUM-DEXTROSE 2-4 GM/100ML-% IV SOLN: 2.0000 g | Freq: Once | INTRAVENOUS | Status: DC

## 2015-09-07 MED ORDER — LIDOCAINE HCL (PF) 1 % IJ SOLN
INTRAMUSCULAR | Status: AC
  Filled 2015-09-07: qty 30

## 2015-09-07 MED ORDER — DEXTROSE 5 % IV SOLN
2.0000 g | INTRAVENOUS | Status: DC
  Administered 2015-09-07: 2 g via INTRAVENOUS

## 2015-09-07 MED ORDER — ACETAMINOPHEN 325 MG PO TABS: 325.0000 mg | ORAL_TABLET | ORAL | Status: DC | PRN

## 2015-09-07 MED ORDER — CHLORHEXIDINE GLUCONATE 4 % EX LIQD: 60.0000 mL | Freq: Once | CUTANEOUS | Status: DC

## 2015-09-07 MED ORDER — MUPIROCIN 2 % EX OINT: 1.0000 "application " | TOPICAL_OINTMENT | Freq: Once | CUTANEOUS | Status: DC

## 2015-09-07 MED ORDER — LIDOCAINE HCL (PF) 1 % IJ SOLN
INTRAMUSCULAR | Status: DC | PRN
  Administered 2015-09-07: 27 mL

## 2015-09-07 MED ORDER — SODIUM CHLORIDE 0.9 % IR SOLN
80.0000 mg | Status: AC
  Administered 2015-09-07: 80 mg

## 2015-09-07 MED ORDER — ONDANSETRON HCL 4 MG/2ML IJ SOLN: 4.0000 mg | Freq: Four times a day (QID) | INTRAMUSCULAR | Status: DC | PRN

## 2015-09-07 MED ORDER — CEFAZOLIN SODIUM-DEXTROSE 2-3 GM-% IV SOLR
INTRAVENOUS | Status: AC
  Filled 2015-09-07: qty 50

## 2015-09-07 MED ORDER — SODIUM CHLORIDE 0.9 % IV SOLN
INTRAVENOUS | Status: DC
  Administered 2015-09-07: 13:00:00 via INTRAVENOUS

## 2015-09-07 MED ORDER — SODIUM CHLORIDE 0.9 % IV SOLN: INTRAVENOUS | Status: AC

## 2015-09-07 SURGICAL SUPPLY — 5 items
CABLE SURGICAL S-101-97-12 (CABLE) ×3 IMPLANT
PACEMAKER ADAPTA DR ADDR01 (Pacemaker) ×1 IMPLANT
PAD DEFIB LIFELINK (PAD) ×3 IMPLANT
PPM ADAPTA DR ADDR01 (Pacemaker) ×3 IMPLANT
TRAY PACEMAKER INSERTION (PACKS) ×3 IMPLANT

## 2015-09-07 NOTE — H&P (View-Only) (Signed)
Patient Care Team: Tracie Harrier, MD as PCP - General (Internal Medicine) Minna Merritts, MD as Consulting Physician (Cardiology)   HPI  Kathryn Short is a 82 y.o. female seen in followup for atrial fibrillation and sinus bradycardia with prior pacemaker implantation.  She was previously on warfarin but because of falls , has been taking aspirin. This decison per primary Cardiology.  Her pacemaker has reached ERI and has reverted to VVI;  She has hx of underlying sinus bradycardia with 100% Ap 0%Vp    She has had symptomatic atrial tach and hypertension   2014 Echo at [facility] >>ejection fraction 50-55%, moderate LVH, mild to moderate TR, right ventricular systolic pressure 40 mm of mercury, mild to moderate aortic valve insufficiency  She has no history of coronary disease. She recalls having had a stress test at [facility]; we are unable to find records.    Past Medical History  Diagnosis Date  . Atrial fibrillation ([place])   . Hypertension   . OSA (obstructive sleep apnea)     On BiPAP  . GERD (gastroesophageal reflux disease)   . Hypothyroidism     Treated  . Uterine cancer ([place])     s/p radiation therapy in 1952  . Melanoma ([place])     Resected from back  . Fall   . Bowel obstruction [facility])     Past Surgical History  Procedure Laterality Date  . Total abdominal hysterectomy w/ bilateral salpingoophorectomy    . Colostomy      For perforation  . Cholecystectomy    . Cytoscopy    . Insert / replace / remove pacemaker      Implantation-Medtronic  . Skin biopsy      face    Current Outpatient Prescriptions  Medication Sig Dispense Refill  . acetaminophen (TYLENOL) 500 MG tablet Takes 2-4 tablets daily.    Marland Kitchen albuterol (PROVENTIL HFA;VENTOLIN HFA) 108 (90 BASE) MCG/ACT inhaler Inhale into the lungs every 6 (six) hours as needed for wheezing or shortness of breath.    Marland Kitchen amiodarone (PACERONE) 200 MG tablet Take 0.5 tablets (100 mg total) by mouth daily. 45 tablet 3  .  aspirin 81 MG tablet Take 162 mg by mouth daily.    Marland Kitchen b complex vitamins tablet Take 1 tablet by mouth daily.    . cloNIDine (CATAPRES) 0.1 MG tablet Take 1 tablet (0.1 mg total) by mouth 3 (three) times daily as needed. 270 tablet 3  . dextromethorphan-guaiFENesin (MUCINEX DM) 30-600 MG 12hr tablet Take 1 tablet by mouth daily.    Marland Kitchen donepezil (ARICEPT) 5 MG tablet Take 5 mg by mouth daily.    Marland Kitchen lactulose (CHRONULAC) 10 GM/15ML solution Take 30 g by mouth daily.     Marland Kitchen levothyroxine (SYNTHROID, LEVOTHROID) 50 MCG tablet Take 50 mcg by mouth daily.     Marland Kitchen losartan (COZAAR) 100 MG tablet Take 1 tablet (100 mg total) by mouth daily. 90 tablet 3  . metoprolol (LOPRESSOR) 50 MG tablet Take 1 tablet (50 mg total) by mouth 2 (two) times daily. 180 tablet 3  . montelukast (SINGULAIR) 10 MG tablet Take 10 mg by mouth at bedtime.    . Multiple Vitamin (MULTIVITAMIN) tablet Take 1 tablet by mouth daily.    . nitroGLYCERIN (NITROSTAT) 0.4 MG SL tablet Place 1 tablet (0.4 mg total) under the tongue every 5 (five) minutes as needed for chest pain. 25 tablet 11  . potassium chloride (K-DUR,KLOR-CON) 10 MEQ tablet Take 1 tablet (10 mEq total)  by mouth daily as needed. (Patient taking differently: Take 5-10 mEq by mouth daily as needed (for fluid). ) 90 tablet 3  . Probiotic Product (PROBIOTIC DAILY PO) Take by mouth daily.    Marland Kitchen torsemide (DEMADEX) 20 MG tablet Take 1 tablet (20 mg total) by mouth daily as needed. (Patient taking differently: Take 10-20 mg by mouth daily. Alternate 0.5 tab and 1 tab every other day, unless fluid overloaded, then increase to 1 tab daily for 5 days) 90 tablet 3   No current facility-administered medications for this visit.    Allergies  Allergen Reactions  . Ace Inhibitors   . Ambien [Zolpidem Tartrate]   . Bactrim [Sulfamethoxazole-Trimethoprim]   . Digoxin And Related   . Hctz [Hydrochlorothiazide]   . Lunesta [Eszopiclone]     Review of Systems negative except from HPI  and PMH  Physical Exam BP 122/62 mmHg  Pulse 65  Resp 16 Well developed and well nourished in no acute distress Well developed and nourished in no acute distress HENT normal Neck supple with JVP-<8 Clear Pocket caudally displaced, they say i am responsible for this mess. Regular rate and rhythm, no murmurs or gallops Abd-soft with active BS No Clubbing cyanosis 1+edema  Wearing compression stockings Skin-warm and dry A & Oriented  Grossly normal sensory and motor function  ECG demonstrates ventricular pacing at 65 no discernible P wave is noted    Assessment and  Plan  Hypertension  Atrial fibrillation  Sinus node dysfunction-symptomatic     Atrial tach non intercurrently   Pacemaker-Medtronic The patient's device was interrogated.  The information was reviewed.   Device has reached ERI and has reverted.  She feels terrible with reversion.  We have reviewed the benefits and risks of generator replacement.  These include but are not limited to lead fracture and infection.  The patient understands, agrees and is willing to proceed.

## 2015-09-07 NOTE — Discharge Instructions (Signed)

## 2015-09-07 NOTE — Interval H&P Note (Signed)
History and Physical Interval Note:  09/07/2015 1:24 PM  Kathryn Short  has presented today for surgery, with the diagnosis of eri  The various methods of treatment have been discussed with the patient and family. After consideration of risks, benefits and other options for treatment, the patient has consented to  Procedure(s):  PPM Generator Changeout (N/A) as a surgical intervention .  The patient's history has been reviewed, patient examined, no change in status, stable for surgery.  I have reviewed the patient's chart and labs.  Questions were answered to the patient's satisfaction.     Kathryn Short  Improved with diuresis prescribed by Dr Gena Fray

## 2015-09-08 ENCOUNTER — Encounter (HOSPITAL_COMMUNITY): Payer: Self-pay | Admitting: Internal Medicine

## 2015-09-08 LAB — CUP PACEART INCLINIC DEVICE CHECK
Battery Impedance: 6608 Ohm
Battery Voltage: 2.6 V
Implantable Lead Implant Date: 20080618
Implantable Lead Location: 753859
Implantable Lead Model: 5076
Lead Channel Impedance Value: 67 Ohm
Lead Channel Pacing Threshold Amplitude: 1 V
Lead Channel Setting Pacing Amplitude: 2.5 V
Lead Channel Setting Sensing Sensitivity: 2.8 mV
MDC IDC LEAD IMPLANT DT: 20080618
MDC IDC LEAD LOCATION: 753860
MDC IDC MSMT LEADCHNL RV IMPEDANCE VALUE: 433 Ohm
MDC IDC MSMT LEADCHNL RV PACING THRESHOLD PULSEWIDTH: 0.4 ms
MDC IDC MSMT LEADCHNL RV SENSING INTR AMPL: 5.6 mV
MDC IDC SESS DTM: 20170321124544
MDC IDC SET LEADCHNL RV PACING PULSEWIDTH: 0.4 ms
MDC IDC STAT BRADY RV PERCENT PACED: 0.1 %

## 2015-09-08 MED FILL — Sodium Chloride Irrigation Soln 0.9%: Qty: 500 | Status: AC

## 2015-09-08 MED FILL — Cefazolin Sodium for IV Soln 2 GM and Dextrose 3% (50 ML): INTRAVENOUS | Qty: 50 | Status: AC

## 2015-09-08 MED FILL — Lidocaine HCl Local Preservative Free (PF) Inj 1%: INTRAMUSCULAR | Qty: 30 | Status: AC

## 2015-09-08 MED FILL — Gentamicin Sulfate Inj 40 MG/ML: INTRAMUSCULAR | Qty: 2 | Status: AC

## 2015-09-15 DIAGNOSIS — R0602 Shortness of breath: Secondary | ICD-10-CM | POA: Diagnosis not present

## 2015-09-15 DIAGNOSIS — E079 Disorder of thyroid, unspecified: Secondary | ICD-10-CM | POA: Diagnosis not present

## 2015-09-15 DIAGNOSIS — I10 Essential (primary) hypertension: Secondary | ICD-10-CM | POA: Diagnosis not present

## 2015-09-15 DIAGNOSIS — I480 Paroxysmal atrial fibrillation: Secondary | ICD-10-CM | POA: Diagnosis not present

## 2015-09-15 DIAGNOSIS — K219 Gastro-esophageal reflux disease without esophagitis: Secondary | ICD-10-CM | POA: Diagnosis not present

## 2015-09-15 DIAGNOSIS — Z87440 Personal history of urinary (tract) infections: Secondary | ICD-10-CM | POA: Diagnosis not present

## 2015-09-19 ENCOUNTER — Telehealth: Payer: Self-pay | Admitting: Cardiovascular Disease

## 2015-09-19 NOTE — Telephone Encounter (Signed)
Pt daughter calling stating Dr Caryl Comes is telling them that he may want to put her back on Blood thinner Would like Dr Rockey Situ thoughts on this?  Please call back.

## 2015-09-20 NOTE — Telephone Encounter (Signed)
I will talk with Dr. Caryl Comes to discuss the options and get back to her

## 2015-09-20 NOTE — Telephone Encounter (Signed)
Mandi, can you call and discuss with daughter

## 2015-09-20 NOTE — Telephone Encounter (Signed)
Spoke w/ Lelon Frohlich.  She reports that Dr. Caryl Comes wants to start her on a blood thinner, but she trusts Dr. Donivan Scull opinion. She does not know the name of the med, as Dr. Caryl Comes told her that he discussed w/ Dr. Rockey Situ. Dr. Ginette Pitman also recommended that she discuss w/ Dr. Rockey Situ, as he is hesitant about starting her on anything. Advised her that I will make Dr. Rockey Situ aware and call her back w/ his recommendation.

## 2015-09-22 ENCOUNTER — Ambulatory Visit (INDEPENDENT_AMBULATORY_CARE_PROVIDER_SITE_OTHER): Payer: Commercial Managed Care - HMO | Admitting: *Deleted

## 2015-09-22 ENCOUNTER — Encounter: Payer: Self-pay | Admitting: Internal Medicine

## 2015-09-22 DIAGNOSIS — Z950 Presence of cardiac pacemaker: Secondary | ICD-10-CM

## 2015-09-22 LAB — CUP PACEART INCLINIC DEVICE CHECK
Battery Remaining Longevity: 130 mo
Brady Statistic AP VP Percent: 0 %
Date Time Interrogation Session: 20170413110436
Implantable Lead Implant Date: 20080813
Implantable Lead Model: 5076
Lead Channel Pacing Threshold Amplitude: 0.75 V
Lead Channel Pacing Threshold Amplitude: 1.25 V
Lead Channel Pacing Threshold Pulse Width: 0.4 ms
Lead Channel Setting Pacing Amplitude: 2 V
Lead Channel Setting Pacing Amplitude: 2.5 V
Lead Channel Setting Pacing Pulse Width: 0.4 ms
Lead Channel Setting Sensing Sensitivity: 2.8 mV
MDC IDC LEAD IMPLANT DT: 20080813
MDC IDC LEAD LOCATION: 753859
MDC IDC LEAD LOCATION: 753860
MDC IDC MSMT BATTERY IMPEDANCE: 100 Ohm
MDC IDC MSMT BATTERY VOLTAGE: 2.8 V
MDC IDC MSMT LEADCHNL RA IMPEDANCE VALUE: 380 Ohm
MDC IDC MSMT LEADCHNL RA PACING THRESHOLD PULSEWIDTH: 0.4 ms
MDC IDC MSMT LEADCHNL RV IMPEDANCE VALUE: 434 Ohm
MDC IDC MSMT LEADCHNL RV SENSING INTR AMPL: 8 mV
MDC IDC STAT BRADY AP VS PERCENT: 99 %
MDC IDC STAT BRADY AS VP PERCENT: 0 %
MDC IDC STAT BRADY AS VS PERCENT: 1 %

## 2015-09-22 NOTE — Progress Notes (Signed)
Wound check appointment. Dermabond removed. Wound without redness or edema. Incision edges approximated, wound well healed. Normal device function. Thresholds, sensing, and impedances consistent with implant measurements. Device programmed at chronic outputs d/t gen changed. Histogram distribution appropriate for patient and level of activity. 5 mode switches (0.3% burden) all appear to be afib, plus ASA, no additional OAC d/t fall risk per family preference.  Patient educated about wound care, and infection s/s. ROV with SK 09/2016, carelink 7/13.

## 2015-10-04 ENCOUNTER — Ambulatory Visit: Payer: Commercial Managed Care - HMO | Admitting: Physician Assistant

## 2015-10-19 ENCOUNTER — Ambulatory Visit (INDEPENDENT_AMBULATORY_CARE_PROVIDER_SITE_OTHER): Payer: Commercial Managed Care - HMO | Admitting: Cardiovascular Disease

## 2015-10-19 ENCOUNTER — Encounter: Payer: Self-pay | Admitting: Cardiovascular Disease

## 2015-10-19 VITALS — BP 110/64 | HR 72 | Ht 62.0 in | Wt 166.2 lb

## 2015-10-19 DIAGNOSIS — I10 Essential (primary) hypertension: Secondary | ICD-10-CM | POA: Diagnosis not present

## 2015-10-19 DIAGNOSIS — I5032 Chronic diastolic (congestive) heart failure: Secondary | ICD-10-CM | POA: Diagnosis not present

## 2015-10-19 DIAGNOSIS — I480 Paroxysmal atrial fibrillation: Secondary | ICD-10-CM | POA: Diagnosis not present

## 2015-10-19 DIAGNOSIS — Z950 Presence of cardiac pacemaker: Secondary | ICD-10-CM | POA: Diagnosis not present

## 2015-10-19 MED ORDER — APIXABAN 5 MG PO TABS: 5.0000 mg | ORAL_TABLET | Freq: Two times a day (BID) | ORAL | Status: DC

## 2015-10-19 NOTE — Assessment & Plan Note (Signed)
Blood pressure is labile, at times high. In the past we have avoided overmedication given labile nature of her pressures Recommended she continue on her daily diuretic in addition to the clonidine, losartan

## 2015-10-19 NOTE — Assessment & Plan Note (Signed)
Long discussion concerning various anticoagulation treatment options for her atrial fibrillation. We spent time talking about warfarin, then each of the NOACs. Daughter did have a discussion with Dr. Caryl Comes, now willing to consider anticoagulation for her mother. We have shown her the EKG showing atrial fibrillation mixed with paced beats. Daughter denies any recent falls. After further discussion, we will start her on eliquis 5 mill grams twice a day. She is over 80 years old but kidneys are normal, weight is normal.

## 2015-10-19 NOTE — Progress Notes (Signed)
Patient ID: RAEANA RESOP, female    DOB: 05/11/1922, 82 y.o.   MRN: [id number]  HPI Comments: Ms. Lazier is an 82 year old woman with paroxysmal atrial fibrillation (last episode in March 123456), diastolic CHF on a low-dose diuretic daily, labile hypertension,  sick sinus syndrome, pacemaker placement followed by Dr. Caryl Comes,  who presented to the hospital [facility] on 02/16/2012 in atrial fibrillation. She presents today for routine followup for her blood pressure and weight gain him a leg edema. Her past surgical history includes open cholecystectomy, partial colon resection and colostomy, appendectomy, hysterectomy, oophorectomy, GYN malignancy treated with radiation in the 1950s Recent bowel obstruction December 2015 She presents for routine follow-up of her blood pressure, diastolic CHF and atrial fibrillation  In follow-up today, patient reports that she is doing well. Most of the history provided by her daughter. Daughter reports she's had no falls as daughter is there most of the time when she is ambulating around the house. Previously was on warfarin, this was held for frequent falls. She did not have any bleeding problems on the warfarin. No daughter reports not to go back on warfarin given the need for frequent trips to the clinic. Blood pressure has been running mildly high. She's been giving diuretic daily with potassium, dramatic improvement in leg edema and weight is down several pounds. Current weight 166 pounds today, previously was more than 170 pounds.  EKG on today's visit shows atrial fibrillation with paced beats  Other past medical history reviewed  in the hospital 05/25/2014, with small bowel obstruction. She was placed on bowel rest, IV fluids and  she passed her bowels. She did not require surgery  Echocardiogram done in the hospital 05/25/2014 showing ejection fraction greater than 55%, moderate pulmonary hypertension, mild to moderate TR  Earlier in 2015, she had atrial  tachycardia and pacer was tracking the atrial rhythm,  Her device was reprogrammed in the office for DDI  She was continued on her beta blocker and amiodarone and rhythm broke, converting back to normal sinus rhythm. Pacemaker has been since changed back to her prior settings   She is reluctant to use a walker, still uses a cane. Anticoagulation  discussed previouslywith the patient and her daughter. They are not interested. They preferred to stay on aspirin  history of falls with trauma requiring stitches. None recently Patient and daughter are afraid of her fall risk, other complications from anticoagulation. Currently not on anticoagulation at their request. They are aware of risks and benefits associated with arrhythmia and stroke.  Previous lab work; Total cholesterol 189, LDL 104, hematocrit 38, creatinine 0.8  previous visits to the  emergency room with chest pain, dizziness, malaise. She had a problem on isosorbide in the past. Feelings of insomnia, and anxious feeling, chest pain episodes at night that wake her from sleep, weakness, nausea, dizziness. She has obstructive sleep apnea and does not use her BiPAP at nighttime  Echocardiogram in the hospital showed ejection fraction 50-55%, moderate LVH, mild to moderate TR, right ventricular systolic pressure 40 mm of mercury, mild to moderate aortic valve insufficiency   Allergies  Allergen Reactions  . Ace Inhibitors Other (See Comments)    Unknown  . Ambien [Zolpidem Tartrate] Other (See Comments)    Dizzy  . Bactrim [Sulfamethoxazole-Trimethoprim] Other (See Comments)    Unknown  . Digoxin And Related Other (See Comments)    Didn't do well  . Hctz [Hydrochlorothiazide] Other (See Comments)    Unknown  . Lunesta [Eszopiclone] Other (  See Comments)    Dizzy    Outpatient Encounter Prescriptions as of 10/19/2015  Medication Sig  . acetaminophen (TYLENOL) 500 MG tablet Take 1,000 mg by mouth daily as needed for mild pain or  headache.   . albuterol (PROVENTIL HFA;VENTOLIN HFA) 108 (90 BASE) MCG/ACT inhaler Inhale 2 puffs into the lungs every 6 (six) hours as needed for wheezing or shortness of breath.   Marland Kitchen amiodarone (PACERONE) 200 MG tablet Take 0.5 tablets (100 mg total) by mouth daily.  Marland Kitchen b complex vitamins tablet Take 1 tablet by mouth daily.  . Carboxymethylcellul-Glycerin (LUBRICATING EYE DROPS OP) Place 2 drops into both eyes 2 (two) times daily.  . cloNIDine (CATAPRES) 0.1 MG tablet Take 1 tablet (0.1 mg total) by mouth 3 (three) times daily as needed. (Patient taking differently: Take 0.1 mg by mouth 3 (three) times daily as needed (for blood pressure). )  . dextromethorphan-guaiFENesin (MUCINEX DM) 30-600 MG 12hr tablet Take 1 tablet by mouth daily.  Marland Kitchen lactulose (CHRONULAC) 10 GM/15ML solution Take 30 g by mouth daily.   Marland Kitchen levothyroxine (SYNTHROID, LEVOTHROID) 50 MCG tablet Take 50 mcg by mouth daily.   Marland Kitchen losartan (COZAAR) 100 MG tablet Take 1 tablet (100 mg total) by mouth daily.  . memantine (NAMENDA) 10 MG tablet Take 10 mg by mouth 2 (two) times daily.  . metoprolol (LOPRESSOR) 50 MG tablet Take 1 tablet (50 mg total) by mouth 2 (two) times daily.  . montelukast (SINGULAIR) 10 MG tablet Take 10 mg by mouth at bedtime.  . Multiple Vitamin (MULTIVITAMIN) tablet Take 1 tablet by mouth daily.  . nitroGLYCERIN (NITROSTAT) 0.4 MG SL tablet Place 1 tablet (0.4 mg total) under the tongue every 5 (five) minutes as needed for chest pain.  . potassium chloride (K-DUR,KLOR-CON) 10 MEQ tablet Take 1 tablet (10 mEq total) by mouth daily as needed. (Patient taking differently: Take 5-10 mEq by mouth daily as needed (for fluid). )  . Probiotic Product (PROBIOTIC DAILY PO) Take 1 capsule by mouth daily.   Marland Kitchen torsemide (DEMADEX) 20 MG tablet Take 1 tablet (20 mg total) by mouth daily as needed. (Patient taking differently: Take 10-20 mg by mouth daily. Alternate 10 mg and 20 mg every other day. Unless fluid builds up then  take 20 mg by mouth daily for 4-5 days)  . [DISCONTINUED] aspirin 81 MG tablet Take 162 mg by mouth daily.  Marland Kitchen apixaban (ELIQUIS) 5 MG TABS tablet Take 1 tablet (5 mg total) by mouth 2 (two) times daily.   No facility-administered encounter medications on file as of 10/19/2015.    Past Medical History  Diagnosis Date  . Atrial fibrillation ([place])   . Hypertension   . OSA (obstructive sleep apnea)     On BiPAP  . GERD (gastroesophageal reflux disease)   . Hypothyroidism     Treated  . Uterine cancer ([place])     s/p radiation therapy in 1952  . Melanoma ([place])     Resected from back  . Fall   . Bowel obstruction [facility])     Past Surgical History  Procedure Laterality Date  . Total abdominal hysterectomy w/ bilateral salpingoophorectomy    . Colostomy      For perforation  . Cholecystectomy    . Cytoscopy    . Insert / replace / remove pacemaker      Implantation-Medtronic  . Skin biopsy      face  . Ep implantable device N/A 09/07/2015    Procedure:  PPM Generator Changeout;  Surgeon: Deboraha Sprang, MD;  Location: [place] [facility];  Service: Cardiovascular;  Laterality: N/A;    Social History  reports that she has never smoked. She does not have any smokeless tobacco history on file. She reports that she does not drink alcohol or use illicit drugs.  Family History Family history is unknown by patient.  Review of Systems  Respiratory: Negative.   Cardiovascular: Negative.   Gastrointestinal: Negative.   Musculoskeletal: Positive for gait problem.  Neurological: Positive for weakness.  All other systems reviewed and are negative.   BP 110/64 mmHg  Pulse 72  Ht 5\' 2"  (1.575 m)  Wt 166 lb 4 oz (75.411 kg)  BMI 30.40 kg/m2  Physical Exam  Constitutional: She is oriented to person, place, and time. She appears well-developed and well-nourished.  HENT:  Head: Normocephalic.  Nose: Nose normal.  Mouth/Throat: Oropharynx is clear and moist.  Eyes: Conjunctivae are  normal. Pupils are equal, round, and reactive to light.  Neck: Normal range of motion. Neck supple. No JVD present.  Cardiovascular: Normal rate, regular rhythm, S1 normal, S2 normal, normal heart sounds and intact distal pulses.  Exam reveals no gallop and no friction rub.   No murmur heard. Trace pitting edema around the ankles, compression hose in place  Pulmonary/Chest: Effort normal. No respiratory distress. She has no wheezes. She has rales. She exhibits no tenderness.  Abdominal: Soft. Bowel sounds are normal. She exhibits no distension. There is no tenderness.  Musculoskeletal: Normal range of motion. She exhibits no edema or tenderness.  Lymphadenopathy:    She has no cervical adenopathy.  Neurological: She is alert and oriented to person, place, and time. Coordination normal.  Skin: Skin is warm and dry. No rash noted. No erythema.  Psychiatric: She has a normal mood and affect. Her behavior is normal. Judgment and thought content normal.    Assessment and Plan  Nursing note and vitals reviewed.

## 2015-10-19 NOTE — Patient Instructions (Addendum)
You are doing well.  Please start eliquis 5 mg   Stop aspirin  Please call us if you have new issues that need to be addressed before your next appt.  Your physician wants you to follow-up in: 2 month.

## 2015-10-19 NOTE — Assessment & Plan Note (Signed)
Recommended that she stay on her torsemide with potassium. Weight is down, leg edema improved, close to her baseline   Total encounter time more than 25 minutes  Greater than 50% was spent in counseling and coordination of care with the patient

## 2015-10-19 NOTE — Assessment & Plan Note (Signed)
Recent pacemaker change out by Dr. Caryl Comes. She denies any complications

## 2015-10-27 DIAGNOSIS — E079 Disorder of thyroid, unspecified: Secondary | ICD-10-CM | POA: Diagnosis not present

## 2015-10-27 DIAGNOSIS — I10 Essential (primary) hypertension: Secondary | ICD-10-CM | POA: Diagnosis not present

## 2015-10-27 DIAGNOSIS — K219 Gastro-esophageal reflux disease without esophagitis: Secondary | ICD-10-CM | POA: Diagnosis not present

## 2015-10-27 DIAGNOSIS — I480 Paroxysmal atrial fibrillation: Secondary | ICD-10-CM | POA: Diagnosis not present

## 2015-10-27 DIAGNOSIS — Z87440 Personal history of urinary (tract) infections: Secondary | ICD-10-CM | POA: Diagnosis not present

## 2015-10-27 DIAGNOSIS — R0602 Shortness of breath: Secondary | ICD-10-CM | POA: Diagnosis not present

## 2015-10-27 DIAGNOSIS — R7989 Other specified abnormal findings of blood chemistry: Secondary | ICD-10-CM | POA: Diagnosis not present

## 2015-10-28 ENCOUNTER — Other Ambulatory Visit: Payer: Self-pay | Admitting: Internal Medicine

## 2015-10-28 ENCOUNTER — Telehealth: Payer: Self-pay | Admitting: Cardiovascular Disease

## 2015-10-28 DIAGNOSIS — R945 Abnormal results of liver function studies: Secondary | ICD-10-CM

## 2015-10-28 NOTE — Telephone Encounter (Signed)
Patients daughter called stating that she was concerned if her mothers heart rate were to become elevated over the weekend and what she could do. Let her know that we always have someone on call if she needs some assistance or has any problems. She stated that Dr. Rockey Situ had her mother take an extra dose of metoprolol and amiodarone during a previous office visit for elevated heart rate and she wanted to know if she could do that at home. She reported that her mothers heart rate was 108 last night and I instructed her that heart rate was normal and not elevated enough to require any extra doses of medication. She said that Dr. Rockey Situ had told her previously that she could take extra doses if needed for elevated heart rate. Educated her that those doses would be if her heart rate were for example in the 120's and sustained even at rest. I then advised her that she should just call in if she has questions about extra doses of medication before giving them because heart rate of 108 is considered normal. She stated that the patients heart rate and blood pressure are within normal limits right now and she is not having any problems. She stated that her mother had some pain in her side last night with the 108 heart rate. Let her know that with pain our heart rate and blood pressure can become elevated. She stated that her mother is now fine and not having any pain. Patient verbalized understanding of our conversation and instructions to call back if any questions.

## 2015-10-28 NOTE — Telephone Encounter (Signed)
Patient woke up around 12 am and had R side pain bp "normal" but heart Rate was 108   Checked hr this morning and it is back down in 70's   Daughter wants to ask Dr.Gollan / nurse about amiodarone dosage if this happens again   Please call to discuss

## 2015-11-02 ENCOUNTER — Ambulatory Visit: Payer: Commercial Managed Care - HMO | Admitting: Cardiovascular Disease

## 2015-11-14 ENCOUNTER — Ambulatory Visit
Admission: RE | Admit: 2015-11-14 | Discharge: 2015-11-14 | Disposition: A | Discharge status: Home / Self Care | Payer: Commercial Managed Care - HMO | Source: Ambulatory Visit | Attending: Internal Medicine | Admitting: Internal Medicine

## 2015-11-14 DIAGNOSIS — C55 Malignant neoplasm of uterus, part unspecified: Secondary | ICD-10-CM | POA: Diagnosis not present

## 2015-11-14 DIAGNOSIS — R937 Abnormal findings on diagnostic imaging of other parts of musculoskeletal system: Secondary | ICD-10-CM | POA: Insufficient documentation

## 2015-11-14 DIAGNOSIS — N329 Bladder disorder, unspecified: Secondary | ICD-10-CM | POA: Diagnosis not present

## 2015-11-14 DIAGNOSIS — N899 Noninflammatory disorder of vagina, unspecified: Secondary | ICD-10-CM | POA: Diagnosis not present

## 2015-11-14 DIAGNOSIS — K7460 Unspecified cirrhosis of liver: Secondary | ICD-10-CM | POA: Insufficient documentation

## 2015-11-14 DIAGNOSIS — J90 Pleural effusion, not elsewhere classified: Secondary | ICD-10-CM | POA: Insufficient documentation

## 2015-11-14 DIAGNOSIS — R945 Abnormal results of liver function studies: Secondary | ICD-10-CM | POA: Diagnosis not present

## 2015-11-14 HISTORY — DX: Heart failure, unspecified: I50.9

## 2015-11-14 MED ORDER — IOPAMIDOL (ISOVUE-300) INJECTION 61%
100.0000 mL | Freq: Once | INTRAVENOUS | Status: AC | PRN
  Administered 2015-11-14: 100 mL via INTRAVENOUS

## 2015-11-21 DIAGNOSIS — K219 Gastro-esophageal reflux disease without esophagitis: Secondary | ICD-10-CM | POA: Diagnosis not present

## 2015-11-21 DIAGNOSIS — I480 Paroxysmal atrial fibrillation: Secondary | ICD-10-CM | POA: Diagnosis not present

## 2015-11-21 DIAGNOSIS — I10 Essential (primary) hypertension: Secondary | ICD-10-CM | POA: Diagnosis not present

## 2015-11-21 DIAGNOSIS — K7460 Unspecified cirrhosis of liver: Secondary | ICD-10-CM | POA: Diagnosis not present

## 2015-11-21 DIAGNOSIS — K760 Fatty (change of) liver, not elsewhere classified: Secondary | ICD-10-CM | POA: Diagnosis not present

## 2015-11-21 DIAGNOSIS — J90 Pleural effusion, not elsewhere classified: Secondary | ICD-10-CM | POA: Diagnosis not present

## 2015-12-02 ENCOUNTER — Encounter: Payer: Self-pay | Admitting: Cardiovascular Disease

## 2015-12-02 ENCOUNTER — Ambulatory Visit (INDEPENDENT_AMBULATORY_CARE_PROVIDER_SITE_OTHER): Payer: Commercial Managed Care - HMO | Admitting: Cardiovascular Disease

## 2015-12-02 VITALS — BP 120/73 | HR 60 | Ht 62.0 in | Wt 165.8 lb

## 2015-12-02 DIAGNOSIS — I480 Paroxysmal atrial fibrillation: Secondary | ICD-10-CM | POA: Diagnosis not present

## 2015-12-02 DIAGNOSIS — K7460 Unspecified cirrhosis of liver: Secondary | ICD-10-CM | POA: Diagnosis not present

## 2015-12-02 DIAGNOSIS — K760 Fatty (change of) liver, not elsewhere classified: Secondary | ICD-10-CM | POA: Diagnosis not present

## 2015-12-02 DIAGNOSIS — I10 Essential (primary) hypertension: Secondary | ICD-10-CM

## 2015-12-02 DIAGNOSIS — R0602 Shortness of breath: Secondary | ICD-10-CM | POA: Diagnosis not present

## 2015-12-02 DIAGNOSIS — I5032 Chronic diastolic (congestive) heart failure: Secondary | ICD-10-CM

## 2015-12-02 DIAGNOSIS — J90 Pleural effusion, not elsewhere classified: Secondary | ICD-10-CM | POA: Diagnosis not present

## 2015-12-02 DIAGNOSIS — I495 Sick sinus syndrome: Secondary | ICD-10-CM

## 2015-12-02 DIAGNOSIS — R600 Localized edema: Secondary | ICD-10-CM

## 2015-12-02 DIAGNOSIS — N3289 Other specified disorders of bladder: Secondary | ICD-10-CM | POA: Diagnosis not present

## 2015-12-02 DIAGNOSIS — K219 Gastro-esophageal reflux disease without esophagitis: Secondary | ICD-10-CM | POA: Diagnosis not present

## 2015-12-02 DIAGNOSIS — J208 Acute bronchitis due to other specified organisms: Secondary | ICD-10-CM | POA: Diagnosis not present

## 2015-12-02 MED ORDER — TORSEMIDE 20 MG PO TABS: 20.0000 mg | ORAL_TABLET | Freq: Two times a day (BID) | ORAL | Status: DC | PRN

## 2015-12-02 NOTE — Progress Notes (Signed)
Patient ID: Kathryn Short, female   DOB: 1921-11-23, 82 y.o.   MRN: [id number] Cardiology Office Note  Date:  12/02/2015   ID:  Kathryn Short, DOB 09/03/1921, MRN [id number]  PCP:  Tracie Harrier, MD   Chief Complaint  Patient presents with  . other    Discuss Heart rate c/o fluid retention and edema ankles. Meds reviewed verbally with pt.    HPI:  Kathryn Short is an 82 year old woman with paroxysmal atrial fibrillation (last episode in March 123456), diastolic CHF on a low-dose diuretic daily, labile hypertension, sick sinus syndrome, pacemaker placement followed by Dr. Caryl Comes, who presented to the hospital [facility] on 02/16/2012 in atrial fibrillation. She presents today for routine followup for her blood pressure and weight gain him a leg edema. Her past surgical history includes open cholecystectomy, partial colon resection and colostomy, appendectomy, hysterectomy, oophorectomy, GYN malignancy treated with radiation in the 1950s Recent bowel obstruction December 2015 She presents for routine follow-up of her blood pressure, diastolic CHF and atrial fibrillation  In follow-up today, daughter reports she has increasing cough, congestion Recent CT scan done by primary care showing small to moderate right pleural effusion, evidence of cirrhosis Mention of cirrhosis back on CT scan in 2015 Mildly elevated LFTs over the past year Daughter reports very mild leg edema, no dramatic weight change Tolerating eliquis 5 mg twice a day She is taking torsemide 20 mg daily, Aldactone recently added  EKG on today's visit shows paced rhythm, rate 60 bpm  Other past medical history reviewed in the hospital 05/25/2014, with small bowel obstruction. She was placed on bowel rest, IV fluids and she passed her bowels. She did not require surgery  Echocardiogram done in the hospital 05/25/2014 showing ejection fraction greater than 55%, moderate pulmonary hypertension, mild to moderate TR  Earlier in  2015, she had atrial tachycardia and pacer was tracking the atrial rhythm, Her device was reprogrammed in the office for DDI  She was continued on her beta blocker and amiodarone and rhythm broke, converting back to normal sinus rhythm. Pacemaker has been since changed back to her prior settings  She is reluctant to use a walker, still uses a cane. Anticoagulation discussed previouslywith the patient and her daughter. They are not interested. They preferred to stay on aspirin  history of falls with trauma requiring stitches. None recently Patient and daughter are afraid of her fall risk, other complications from anticoagulation. Currently not on anticoagulation at their request. They are aware of risks and benefits associated with arrhythmia and stroke.  Previous lab work; Total cholesterol 189, LDL 104, hematocrit 38, creatinine 0.8  previous visits to the emergency room with chest pain, dizziness, malaise. She had a problem on isosorbide in the past. Feelings of insomnia, and anxious feeling, chest pain episodes at night that wake her from sleep, weakness, nausea, dizziness. She has obstructive sleep apnea and does not use her BiPAP at nighttime  Echocardiogram in the hospital showed ejection fraction 50-55%, moderate LVH, mild to moderate TR, right ventricular systolic pressure 40 mm of mercury, mild to moderate aortic valve insufficiency  PMH:   has a past medical history of Atrial fibrillation ([place]); Hypertension; OSA (obstructive sleep apnea); GERD (gastroesophageal reflux disease); Hypothyroidism; Uterine cancer ([place]); Melanoma ([place]); Fall; Bowel obstruction ([place]); and CHF (congestive heart failure) ([place]).  PSH:    Past Surgical History  Procedure Laterality Date  . Total abdominal hysterectomy w/ bilateral salpingoophorectomy    . Colostomy      For perforation  .  Cholecystectomy    . Cytoscopy    . Insert / replace / remove pacemaker      Implantation-[employer]tr[employer]  . Skin  biopsy      face  . Ep implantable device N/A 09/07/2015    Procedure:  PPM Generator Changeout;  Surgeon: Deboraha Sprang, MD;  Location: [place] [facility];  Service: Cardiovascular;  Laterality: N/A;    Current Outpatient Prescriptions  Medication Sig Dispense Refill  . acetaminophen (TYLENOL) 500 MG tablet Take 1,000 mg by mouth daily as needed for mild pain or headache.     . albuterol (PROVENTIL HFA;VENTOLIN HFA) 108 (90 BASE) MCG/ACT inhaler Inhale 2 puffs into the lungs every 6 (six) hours as needed for wheezing or shortness of breath.     Marland Kitchen amiodarone (PACERONE) 200 MG tablet Take 0.5 tablets (100 mg total) by mouth daily. 45 tablet 3  . apixaban (ELIQUIS) 5 MG TABS tablet Take 1 tablet (5 mg total) by mouth 2 (two) times daily. 60 tablet 6  . b complex vitamins tablet Take 1 tablet by mouth daily.    . Carboxymethylcellul-Glycerin (LUBRICATING EYE DROPS OP) Place 2 drops into both eyes 2 (two) times daily.    . cefUROXime (CEFTIN) 250 MG tablet Take 250 mg by mouth 2 (two) times daily with a meal.    . cloNIDine (CATAPRES) 0.1 MG tablet Take 1 tablet (0.1 mg total) by mouth 3 (three) times daily as needed. (Patient taking differently: Take 0.1 mg by mouth 3 (three) times daily as needed (for blood pressure). ) 270 tablet 3  . dextromethorphan-guaiFENesin (MUCINEX DM) 30-600 MG 12hr tablet Take 1 tablet by mouth daily.    Marland Kitchen lactulose (CHRONULAC) 10 GM/15ML solution Take 30 g by mouth daily.     Marland Kitchen levothyroxine (SYNTHROID, LEVOTHROID) 50 MCG tablet Take 50 mcg by mouth daily.     Marland Kitchen losartan (COZAAR) 100 MG tablet Take 1 tablet (100 mg total) by mouth daily. 90 tablet 3  . memantine (NAMENDA) 10 MG tablet Take 10 mg by mouth 2 (two) times daily.    . metoprolol (LOPRESSOR) 50 MG tablet Take 1 tablet (50 mg total) by mouth 2 (two) times daily. 180 tablet 3  . montelukast (SINGULAIR) 10 MG tablet Take 10 mg by mouth at bedtime.    . Multiple Vitamin (MULTIVITAMIN) tablet Take 1 tablet by  mouth daily.    . nitroGLYCERIN (NITROSTAT) 0.4 MG SL tablet Place 1 tablet (0.4 mg total) under the tongue every 5 (five) minutes as needed for chest pain. 25 tablet 11  . potassium chloride (K-DUR,KLOR-CON) 10 MEQ tablet Take 1 tablet (10 mEq total) by mouth daily as needed. (Patient taking differently: Take 10 mEq by mouth daily. ) 90 tablet 3  . Probiotic Product (PROBIOTIC DAILY PO) Take 1 capsule by mouth daily.     Marland Kitchen spironolactone (ALDACTONE) 25 MG tablet Take 25 mg by mouth daily.    Marland Kitchen torsemide (DEMADEX) 20 MG tablet Take 1 tablet (20 mg total) by mouth 2 (two) times daily as needed. 180 tablet 3   No current facility-administered medications for this visit.     Allergies:   Ace inhibitors; Ambien; Bactrim; Digoxin and related; Hctz; and Lunesta   Social History:  The patient  reports that she has never smoked. She does not have any smokeless tobacco history on file. She reports that she does not drink alcohol or use illicit drugs.   Family History:   Family history is unknown by patient.  Review of Systems: Review of Systems  Constitutional: Negative.   Respiratory: Negative.   Cardiovascular: Negative.   Gastrointestinal: Negative.   Musculoskeletal: Negative.   Neurological: Negative.   Psychiatric/Behavioral: Negative.   All other systems reviewed and are negative.    PHYSICAL EXAM: VS:  BP 120/73 mmHg  Pulse 60  Ht 5\' 2"  (1.575 m)  Wt 165 lb 12 oz (75.184 kg)  BMI 30.31 kg/m2 , BMI Body mass index is 30.31 kg/(m^2). GEN: Well nourished, well developed, in no acute distress HEENT: normal Neck: no JVD, carotid bruits, or masses Cardiac: RRR; no murmurs, rubs, or gallops, trace lower extremity edema  Respiratory:  clear to auscultation bilaterally, normal work of breathing GI: soft, nontender, nondistended, + BS MS: no deformity or atrophy Skin: warm and dry, no rash Neuro:  Strength and sensation are intact Psych: euthymic mood, full affect    Recent  Labs: 08/30/2015: BUN 18; Creatinine, Ser 0.93; Platelets 252; Potassium 4.7; Sodium 129*    Lipid Panel No results found for: CHOL, HDL, LDLCALC, TRIG    Wt Readings from Last 3 Encounters:  12/02/15 165 lb 12 oz (75.184 kg)  10/19/15 166 lb 4 oz (75.411 kg)  09/07/15 162 lb (73.483 kg)       ASSESSMENT AND PLAN:  Paroxysmal atrial fibrillation (HCC) - Plan: EKG 12-Lead Rate well controlled, tolerating anticoagulation  Chronic diastolic CHF (congestive heart failure) (HCC) Small to moderate pleural effusion on the right on recent CT scan Etiology unclear, underlying cirrhosis Recommended she increase torsemide up to 30 mg daily in addition to her Aldactone Previously was taking 20 minute grams daily  Chronotropic incompetence with sinus node dysfunction (HCC) Pacemaker followed by Dr. Caryl Comes  Essential hypertension Blood pressure is well controlled on today's visit. No changes made to the medications.  Localized edema Trace edema, stable weight on today's visit Likely combination of chronic diastolic CHF and venous insufficiency   Total encounter time more than 25 minutes  Greater than 50% was spent in counseling and coordination of care with the patient   Disposition:   F/U  2 months   Orders Placed This Encounter  Procedures  . EKG 12-Lead     Signed, Esmond Plants, M.D., Ph.D. 12/02/2015  [place], [place]

## 2015-12-02 NOTE — Patient Instructions (Addendum)
Medication Instructions:   Please increase the torsemide up to 1   1/2 pills every day Mild fluid restriction  Low carbohydrates   Follow-Up: It was a pleasure seeing you in the office today. Please call us if you have new issues that need to be addressed before your next appt.  [phone]  Your physician wants you to follow-up in: 2 months.   If you need a refill on your cardiac medications before your next appointment, please call your pharmacy.

## 2015-12-08 DIAGNOSIS — I4891 Unspecified atrial fibrillation: Secondary | ICD-10-CM | POA: Diagnosis not present

## 2015-12-08 DIAGNOSIS — M629 Disorder of muscle, unspecified: Secondary | ICD-10-CM | POA: Diagnosis not present

## 2015-12-08 DIAGNOSIS — G4733 Obstructive sleep apnea (adult) (pediatric): Secondary | ICD-10-CM | POA: Diagnosis not present

## 2015-12-08 DIAGNOSIS — M6281 Muscle weakness (generalized): Secondary | ICD-10-CM | POA: Diagnosis not present

## 2015-12-08 DIAGNOSIS — I10 Essential (primary) hypertension: Secondary | ICD-10-CM | POA: Diagnosis not present

## 2015-12-15 ENCOUNTER — Ambulatory Visit: Payer: Commercial Managed Care - HMO | Admitting: Urology

## 2015-12-15 DIAGNOSIS — I952 Hypotension due to drugs: Secondary | ICD-10-CM | POA: Diagnosis not present

## 2015-12-15 DIAGNOSIS — I480 Paroxysmal atrial fibrillation: Secondary | ICD-10-CM | POA: Diagnosis not present

## 2015-12-15 DIAGNOSIS — E079 Disorder of thyroid, unspecified: Secondary | ICD-10-CM | POA: Diagnosis not present

## 2015-12-15 DIAGNOSIS — K7460 Unspecified cirrhosis of liver: Secondary | ICD-10-CM | POA: Diagnosis not present

## 2015-12-22 ENCOUNTER — Ambulatory Visit: Payer: Commercial Managed Care - HMO | Admitting: *Deleted

## 2015-12-22 ENCOUNTER — Telehealth: Payer: Self-pay | Admitting: Cardiology

## 2015-12-22 NOTE — Telephone Encounter (Signed)
Confirmed remote transmission w/ pt wife.   

## 2015-12-23 ENCOUNTER — Encounter: Payer: Self-pay | Admitting: Cardiology

## 2015-12-30 DIAGNOSIS — I5032 Chronic diastolic (congestive) heart failure: Secondary | ICD-10-CM | POA: Diagnosis not present

## 2015-12-30 DIAGNOSIS — Z87440 Personal history of urinary (tract) infections: Secondary | ICD-10-CM | POA: Diagnosis not present

## 2015-12-30 DIAGNOSIS — R600 Localized edema: Secondary | ICD-10-CM | POA: Diagnosis not present

## 2015-12-30 DIAGNOSIS — I480 Paroxysmal atrial fibrillation: Secondary | ICD-10-CM | POA: Diagnosis not present

## 2015-12-30 DIAGNOSIS — K7460 Unspecified cirrhosis of liver: Secondary | ICD-10-CM | POA: Diagnosis not present

## 2015-12-30 DIAGNOSIS — I10 Essential (primary) hypertension: Secondary | ICD-10-CM | POA: Diagnosis not present

## 2016-01-02 ENCOUNTER — Encounter: Payer: Self-pay | Admitting: Cardiology

## 2016-01-02 ENCOUNTER — Telehealth: Payer: Self-pay | Admitting: Internal Medicine

## 2016-01-02 NOTE — Telephone Encounter (Signed)
New message         1. Has your device fired? no  2. Is you device beeping? no 3. Are you experiencing draining or swelling at device site? no  4. Are you calling to see if we received your device transmission? Someone from here called and left a message that they did not get the last transmission  5. Have you passed out? No     The daughter needs to know what to do she states they got a check on the device and if someone could leave a message

## 2016-01-02 NOTE — Telephone Encounter (Signed)
Spoke w/ pt daughter. She informed me that pt received her a new monitor. Pt daughter gave me pt new serial number. Input and save this information into carelink. Pt daughter is going to send another transmission tomorrow.

## 2016-01-03 ENCOUNTER — Ambulatory Visit (INDEPENDENT_AMBULATORY_CARE_PROVIDER_SITE_OTHER): Payer: Commercial Managed Care - HMO | Admitting: *Deleted

## 2016-01-03 DIAGNOSIS — Z950 Presence of cardiac pacemaker: Secondary | ICD-10-CM | POA: Diagnosis not present

## 2016-01-03 DIAGNOSIS — R001 Bradycardia, unspecified: Secondary | ICD-10-CM

## 2016-01-03 LAB — CUP PACEART REMOTE DEVICE CHECK
Battery Impedance: 100 Ohm
Battery Impedance: 100 Ohm
Battery Remaining Longevity: 130 mo
Battery Remaining Longevity: 130 mo
Battery Voltage: 2.8 V
Battery Voltage: 2.8 V
Brady Statistic AP VP Percent: 0 %
Brady Statistic AP VP Percent: 0 %
Brady Statistic AP VS Percent: 94 %
Brady Statistic AP VS Percent: 94 %
Brady Statistic AS VP Percent: 1 %
Brady Statistic AS VP Percent: 1 %
Brady Statistic AS VS Percent: 5 %
Brady Statistic AS VS Percent: 5 %
Date Time Interrogation Session: 20170725182555
Date Time Interrogation Session: 20170725182555
Implantable Lead Implant Date: 20080813
Implantable Lead Implant Date: 20080813
Implantable Lead Implant Date: 20080813
Implantable Lead Implant Date: 20080813
Implantable Lead Location: 753859
Implantable Lead Location: 753859
Implantable Lead Location: 753860
Implantable Lead Location: 753860
Implantable Lead Model: 5076
Implantable Lead Model: 5076
Implantable Lead Model: 5076
Implantable Lead Model: 5076
Lead Channel Impedance Value: 380 Ohm
Lead Channel Impedance Value: 380 Ohm
Lead Channel Impedance Value: 446 Ohm
Lead Channel Impedance Value: 446 Ohm
Lead Channel Pacing Threshold Amplitude: 0.875 V
Lead Channel Pacing Threshold Amplitude: 0.875 V
Lead Channel Pacing Threshold Amplitude: 1 V
Lead Channel Pacing Threshold Amplitude: 1 V
Lead Channel Pacing Threshold Pulse Width: 0.4 ms
Lead Channel Pacing Threshold Pulse Width: 0.4 ms
Lead Channel Pacing Threshold Pulse Width: 0.4 ms
Lead Channel Pacing Threshold Pulse Width: 0.4 ms
Lead Channel Sensing Intrinsic Amplitude: 5.6 mV
Lead Channel Sensing Intrinsic Amplitude: 5.6 mV
Lead Channel Setting Pacing Amplitude: 2 V
Lead Channel Setting Pacing Amplitude: 2 V
Lead Channel Setting Pacing Amplitude: 2.5 V
Lead Channel Setting Pacing Amplitude: 2.5 V
Lead Channel Setting Pacing Pulse Width: 0.46 ms
Lead Channel Setting Pacing Pulse Width: 0.46 ms
Lead Channel Setting Sensing Sensitivity: 2.8 mV
Lead Channel Setting Sensing Sensitivity: 2.8 mV

## 2016-01-04 ENCOUNTER — Encounter: Payer: Self-pay | Admitting: Cardiology

## 2016-01-04 NOTE — Progress Notes (Signed)
Remote pacemaker transmission.   

## 2016-01-07 DIAGNOSIS — I4891 Unspecified atrial fibrillation: Secondary | ICD-10-CM | POA: Diagnosis not present

## 2016-01-07 DIAGNOSIS — G4733 Obstructive sleep apnea (adult) (pediatric): Secondary | ICD-10-CM | POA: Diagnosis not present

## 2016-01-07 DIAGNOSIS — M6281 Muscle weakness (generalized): Secondary | ICD-10-CM | POA: Diagnosis not present

## 2016-01-07 DIAGNOSIS — M629 Disorder of muscle, unspecified: Secondary | ICD-10-CM | POA: Diagnosis not present

## 2016-01-07 DIAGNOSIS — I10 Essential (primary) hypertension: Secondary | ICD-10-CM | POA: Diagnosis not present

## 2016-01-13 ENCOUNTER — Ambulatory Visit: Payer: Commercial Managed Care - HMO | Admitting: Urology

## 2016-01-16 ENCOUNTER — Emergency Department
Admission: EM | Admit: 2016-01-16 | Discharge: 2016-01-17 | Disposition: A | Discharge status: Home / Self Care | Payer: Commercial Managed Care - HMO | Attending: Emergency Medicine | Admitting: Emergency Medicine

## 2016-01-16 ENCOUNTER — Emergency Department: Payer: Commercial Managed Care - HMO

## 2016-01-16 DIAGNOSIS — R51 Headache: Secondary | ICD-10-CM | POA: Insufficient documentation

## 2016-01-16 DIAGNOSIS — I5032 Chronic diastolic (congestive) heart failure: Secondary | ICD-10-CM | POA: Diagnosis not present

## 2016-01-16 DIAGNOSIS — E039 Hypothyroidism, unspecified: Secondary | ICD-10-CM | POA: Diagnosis not present

## 2016-01-16 DIAGNOSIS — Z79899 Other long term (current) drug therapy: Secondary | ICD-10-CM | POA: Insufficient documentation

## 2016-01-16 DIAGNOSIS — I110 Hypertensive heart disease with heart failure: Secondary | ICD-10-CM | POA: Diagnosis not present

## 2016-01-16 DIAGNOSIS — E869 Volume depletion, unspecified: Secondary | ICD-10-CM

## 2016-01-16 DIAGNOSIS — R519 Headache, unspecified: Secondary | ICD-10-CM

## 2016-01-16 NOTE — ED Triage Notes (Addendum)
Pt to triage via w/c with no distress noted; pt reports numbness to right side of head x 2 days with pain; daughter reports pt with swelling to feet; pt denies any other c/o or accomp symptoms; st recently dx with cirrhosis and has had recent hypotension and elevated HR; new pacemaker placed in March

## 2016-01-16 NOTE — ED Provider Notes (Signed)
[facility] Emergency Department Provider Note  ____________________________________________   First MD Initiated Contact with Patient 01/16/16 2307     (approximate)  I have reviewed the triage vital signs and the nursing notes.   HISTORY  Chief Complaint Headache  The patient has severe chronic dementia and history is provided completely by the patient's adult daughter who is her full-time caregiver.  HPI Kathryn Short is a 82 y.o. female with a history of dementia who presents with a chief complaint of "not feeling well" with a gradual onset about 3 days.The patient is not providing history to me at this time, simply smiling and tracking me but not talking currently.  The daughter states that she has been communicating last 3 days that she feels "full" and "stuffy" in her head and her sinuses and feels the same way she did the last time she had a sinus infection.  She has reportedly been telling her daughter that she does not feel well and that if she does not start feeling better she think she is going to die.  However she has not provided any specific symptoms.  Upon review of systems with the daughter, she denies fever/chills, chest pain, shortness of breath, nausea, vomiting, dysuria, diarrhea.  It is difficult to assess the severity of the symptoms since the main symptom is simply not feeling well.  Her daughter does say that she cares for the patient at all times and that she seems "off" but cannot further describe it.  She called the patient's primary care doctor, Dr. Ginette Pitman, earlier in the day and he called in a course of amoxicillin for what sounded like a sinus infection and she has had 1 dose of medication.   Past Medical History:  Diagnosis Date  . Atrial fibrillation ([place])   . Bowel obstruction ([place])   . CHF (congestive heart failure) ([place])   . Fall   . GERD (gastroesophageal reflux disease)   . Hypertension   . Hypothyroidism    Treated  .  Melanoma ([place])    Resected from back  . OSA (obstructive sleep apnea)    On BiPAP  . Uterine cancer [facility])    s/p radiation therapy in 1952    Patient Active Problem List   Diagnosis Date Noted  . Chronic diastolic CHF (congestive heart failure) ([place]) 10/19/2015  . Small bowel obstruction ([place]) 06/17/2014  . Atrial tachycardia ([place]) 03/11/2014  . Nocturia 12/31/2013  . Chronotropic incompetence with sinus node dysfunction ([facility]) 01/29/2013  . Falls frequently 10/29/2012  . Chest pain 02/28/2012  . Edema 01/29/2012  . WEAKNESS 11/08/2009  . Essential hypertension 01/22/2009  . Atrial fibrillation ([place]) 01/22/2009  . PACEMAKER, PERMANENT 01/22/2009    Past Surgical History:  Procedure Laterality Date  . CHOLECYSTECTOMY    . COLOSTOMY     For perforation  . Cytoscopy    . EP IMPLANTABLE DEVICE N/A 09/07/2015   Procedure:  PPM Generator Changeout;  Surgeon: Deboraha Sprang, MD;  Location: [place] CV LAB;  Service: Cardiovascular;  Laterality: N/A;  . INSERT / REPLACE / REMOVE PACEMAKER     Implantation-[employer]  . SKIN BIOPSY     face  . TOTAL ABDOMINAL HYSTERECTOMY W/ BILATERAL SALPINGOOPHORECTOMY      Prior to Admission medications   Medication Sig Start Date End Date Taking? Authorizing Provider  acetaminophen (TYLENOL) 500 MG tablet Take 1,000 mg by mouth daily as needed for mild pain or headache.     Historical  Provider, MD  albuterol (PROVENTIL HFA;VENTOLIN HFA) 108 (90 BASE) MCG/ACT inhaler Inhale 2 puffs into the lungs every 6 (six) hours as needed for wheezing or shortness of breath.     Historical Provider, MD  amiodarone (PACERONE) 200 MG tablet Take 0.5 tablets (100 mg total) by mouth daily. 03/28/15   Minna Merritts, MD  apixaban (ELIQUIS) 5 MG TABS tablet Take 1 tablet (5 mg total) by mouth 2 (two) times daily. 10/19/15   Minna Merritts, MD  b complex vitamins tablet Take 1 tablet by mouth daily.    Historical Provider, MD  Carboxymethylcellul-Glycerin  (LUBRICATING EYE DROPS OP) Place 2 drops into both eyes 2 (two) times daily.    Historical Provider, MD  cefUROXime (CEFTIN) 250 MG tablet Take 250 mg by mouth 2 (two) times daily with a meal.    Historical Provider, MD  cloNIDine (CATAPRES) 0.1 MG tablet Take 1 tablet (0.1 mg total) by mouth 3 (three) times daily as needed. Patient taking differently: Take 0.1 mg by mouth 3 (three) times daily as needed (for blood pressure).  03/28/15   Minna Merritts, MD  dextromethorphan-guaiFENesin (MUCINEX DM) 30-600 MG 12hr tablet Take 1 tablet by mouth daily.    Historical Provider, MD  lactulose (CHRONULAC) 10 GM/15ML solution Take 30 g by mouth daily.  05/31/14   Historical Provider, MD  levothyroxine (SYNTHROID, LEVOTHROID) 50 MCG tablet Take 50 mcg by mouth daily.     Historical Provider, MD  losartan (COZAAR) 100 MG tablet Take 1 tablet (100 mg total) by mouth daily. 03/28/15   Minna Merritts, MD  memantine (NAMENDA) 10 MG tablet Take 10 mg by mouth 2 (two) times daily.    Historical Provider, MD  metoprolol (LOPRESSOR) 50 MG tablet Take 1 tablet (50 mg total) by mouth 2 (two) times daily. 03/28/15   Minna Merritts, MD  montelukast (SINGULAIR) 10 MG tablet Take 10 mg by mouth at bedtime. 10/14/14   Historical Provider, MD  Multiple Vitamin (MULTIVITAMIN) tablet Take 1 tablet by mouth daily.    Historical Provider, MD  nitroGLYCERIN (NITROSTAT) 0.4 MG SL tablet Place 1 tablet (0.4 mg total) under the tongue every 5 (five) minutes as needed for chest pain. 03/28/15   Minna Merritts, MD  potassium chloride (K-DUR,KLOR-CON) 10 MEQ tablet Take 1 tablet (10 mEq total) by mouth daily as needed. Patient taking differently: Take 10 mEq by mouth daily.  03/28/15   Minna Merritts, MD  Probiotic Product (PROBIOTIC DAILY PO) Take 1 capsule by mouth daily.     Historical Provider, MD  spironolactone (ALDACTONE) 25 MG tablet Take 25 mg by mouth daily.    Historical Provider, MD  torsemide (DEMADEX) 20 MG tablet  Take 1 tablet (20 mg total) by mouth 2 (two) times daily as needed. 12/02/15   Minna Merritts, MD    Allergies Ace inhibitors; Ambien [zolpidem tartrate]; Bactrim [sulfamethoxazole-trimethoprim]; Digoxin and related; Hctz [hydrochlorothiazide]; and Lunesta [eszopiclone]  Family History  Problem Relation Age of Onset  . Family history unknown: Yes    Social History Social History  Substance Use Topics  . Smoking status: Never Smoker  . Smokeless tobacco: Not on file     Comment: Tobacco use-no  . Alcohol use No    Review of Systems Constitutional: No fever/chills.  "Not feeling well" Eyes: No visual changes. ENT: No sore throat. Cardiovascular: Denies chest pain. Respiratory: Denies shortness of breath. Gastrointestinal: No abdominal pain.  No nausea, no vomiting.  No  diarrhea.  No constipation. Genitourinary: Negative for dysuria. Musculoskeletal: Negative for back pain. Skin: Negative for rash. Neurological: Generalized vague headache with a sensation of "stuffiness"  10-point ROS otherwise negative.  ____________________________________________   PHYSICAL EXAM:  VITAL SIGNS: ED Triage Vitals [01/16/16 2203]  Enc Vitals Group     BP (!) 151/85     Pulse Rate 78     Resp 20     Temp 97.4 F (36.3 C)     Temp Source Oral     SpO2 98 %     Weight 166 lb (75.3 kg)     Height 5\' 2"  (1.575 m)     Head Circumference      Peak Flow      Pain Score      Pain Loc      Pain Edu?      Excl. in [place]?     Constitutional: Alert, Smiling and in no acute distress, not speaking with me at this time, but responds to simple commands during exam Eyes: Conjunctivae are normal. PERRL. EOMI. Head: Atraumatic. Nose: No congestion/rhinnorhea. Mouth/Throat: Mucous membranes are moist.  Oropharynx non-erythematous. Neck: No stridor.  No meningeal signs.   Cardiovascular: Normal rate, regular rhythm. Good peripheral circulation. Grossly normal heart sounds.   Respiratory: Normal  respiratory effort.  No retractions. Lungs CTAB. Gastrointestinal: Soft and nontender. No distention.  Musculoskeletal: Trace pitting edema in bilateral lower extremities. No gross deformities of extremities. Neurologic:  No gross focal neurologic deficits are appreciated.  Skin:  Skin is warm, dry and intact. No rash noted.   ____________________________________________   LABS (all labs ordered are listed, but only abnormal results are displayed)  Labs Reviewed  URINALYSIS COMPLETEWITH MICROSCOPIC ([place]) - Abnormal; Notable for the following:       Result Value   Color, Urine YELLOW (*)    APPearance CLEAR (*)    Squamous Epithelial / LPF 0-5 (*)    All other components within normal limits  CBC WITH DIFFERENTIAL/PLATELET - Abnormal; Notable for the following:    WBC 11.2 (*)    RDW 15.0 (*)    Neutro Abs 6.9 (*)    All other components within normal limits  COMPREHENSIVE METABOLIC PANEL - Abnormal; Notable for the following:    Chloride 98 (*)    Glucose, Bld 113 (*)    BUN 29 (*)    Creatinine, Ser 1.22 (*)    AST 61 (*)    GFR calc non Af Amer 37 (*)    GFR calc Af Amer 43 (*)    All other components within normal limits  URINE CULTURE   ____________________________________________  EKG  ED ECG REPORT I, Thelton Graca, the attending physician, personally viewed and interpreted this ECG.   Date: 01/16/2016  EKG Time: 23:48  Rate: 88  Rhythm: Ventricular paced complexes  Axis: Normal  Intervals:Unremarkable  ST&T Change: Non-specific ST segment / T-wave changes, but no evidence of acute ischemia.   ____________________________________________  RADIOLOGY   Ct Head Wo Contrast  Result Date: 01/16/2016 CLINICAL DATA:  Headache.  Right-sided numbness for 2 days. EXAM: CT HEAD WITHOUT CONTRAST TECHNIQUE: Contiguous axial images were obtained from the base of the skull through the vertex without intravenous contrast. COMPARISON:  Head CT 02/28/2015 FINDINGS:  Brain: Generalized atrophy and chronic small vessel ischemia, stable from prior CT. No intracranial hemorrhage, mass effect, or midline shift. No hydrocephalus. The basilar cisterns are patent. No evidence of territorial infarct. No intracranial fluid collection.  Vascular: No hyperdense vessel or abnormal calcification. Atherosclerosis of skullbase vasculature. Skull: Negative for fracture or focal lesion. Calvarium is intact. Sinuses/Orbits: No acute findings. Presumed bilateral cataract extraction. Included paranasal sinuses and mastoid air cells are well aerated. Other: None. IMPRESSION: Stable atrophy and chronic small vessel ischemia. No acute intracranial abnormality. Electronically Signed   By: Jeb Levering M.D.   On: 01/16/2016 22:51    ____________________________________________   PROCEDURES  Procedure(s) performed:   Procedures   Critical Care performed: No ____________________________________________   INITIAL IMPRESSION / ASSESSMENT AND PLAN / ED COURSE  Pertinent labs & imaging results that were available during my care of the patient were reviewed by me and considered in my medical decision making (see chart for details).  Given the vague chief complaint and history, I asked the daughter how she would like to proceed since the patient's vital signs are reassuring and her head CT has no acute findings.  She is concerned about the patient being off of her baseline for the last 3 days and would feel more comfortable if we checked blood work and a urinalysis, so I have ordered these tests.  If everything is unremarkable or within normal limits I anticipate that they may follow-up with Dr. Ginette Pitman at the next available opportunity.  Clinical Course  Comment By Time  The patient remains in no acute distress and is now speaking with me and is very cheerful and happy.  I gave her a small fluid bolus because of a very slight elevation of her creatinine which may represent some  volume depletion.  Her daughter feels better and they will follow up as an outpatient.I gave my usual and customary return precautions.  Hinda Kehr, MD 08/08 [phone]    ____________________________________________  FINAL CLINICAL IMPRESSION(S) / ED DIAGNOSES  Final diagnoses:  Nonintractable episodic headache, unspecified headache type  Volume depletion     MEDICATIONS GIVEN DURING THIS VISIT:  Medications  sodium chloride 0.9 % bolus 500 mL (0 mLs Intravenous Stopped 01/17/16 0211)  acetaminophen (TYLENOL) tablet 650 mg (650 mg Oral Given 01/17/16 0138)     NEW OUTPATIENT MEDICATIONS STARTED DURING THIS VISIT:  New Prescriptions   No medications on file      Note:  This document was prepared using Dragon voice recognition software and may include unintentional dictation errors.    Hinda Kehr, MD 01/17/16 [phone]

## 2016-01-17 DIAGNOSIS — I9589 Other hypotension: Secondary | ICD-10-CM | POA: Diagnosis not present

## 2016-01-17 DIAGNOSIS — I5032 Chronic diastolic (congestive) heart failure: Secondary | ICD-10-CM | POA: Diagnosis not present

## 2016-01-17 DIAGNOSIS — K7460 Unspecified cirrhosis of liver: Secondary | ICD-10-CM | POA: Diagnosis not present

## 2016-01-17 DIAGNOSIS — R600 Localized edema: Secondary | ICD-10-CM | POA: Diagnosis not present

## 2016-01-17 DIAGNOSIS — I480 Paroxysmal atrial fibrillation: Secondary | ICD-10-CM | POA: Diagnosis not present

## 2016-01-17 DIAGNOSIS — R0602 Shortness of breath: Secondary | ICD-10-CM | POA: Diagnosis not present

## 2016-01-17 DIAGNOSIS — K219 Gastro-esophageal reflux disease without esophagitis: Secondary | ICD-10-CM | POA: Diagnosis not present

## 2016-01-17 LAB — CBC WITH DIFFERENTIAL/PLATELET
Basophils Absolute: 0.1 10*3/uL (ref 0–0.1)
Basophils Relative: 1 %
Eosinophils Absolute: 0.3 10*3/uL (ref 0–0.7)
Eosinophils Relative: 3 %
HCT: 38.2 % (ref 35.0–47.0)
Hemoglobin: 13 g/dL (ref 12.0–16.0)
Lymphocytes Relative: 27 %
Lymphs Abs: 3 10*3/uL (ref 1.0–3.6)
MCH: 31.1 pg (ref 26.0–34.0)
MCHC: 34 g/dL (ref 32.0–36.0)
MCV: 91.7 fL (ref 80.0–100.0)
Monocytes Absolute: 0.7 10*3/uL (ref 0.2–0.9)
Monocytes Relative: 6 %
Neutro Abs: 6.9 10*3/uL — ABNORMAL HIGH (ref 1.4–6.5)
Neutrophils Relative %: 63 %
Platelets: 220 10*3/uL (ref 150–440)
RBC: 4.17 MIL/uL (ref 3.80–5.20)
RDW: 15 % — ABNORMAL HIGH (ref 11.5–14.5)
WBC: 11.2 10*3/uL — ABNORMAL HIGH (ref 3.6–11.0)

## 2016-01-17 LAB — URINALYSIS COMPLETE WITH MICROSCOPIC (ARMC ONLY)
BACTERIA UA: NONE SEEN
BILIRUBIN URINE: NEGATIVE
GLUCOSE, UA: NEGATIVE mg/dL
HGB URINE DIPSTICK: NEGATIVE
Ketones, ur: NEGATIVE mg/dL
Leukocytes, UA: NEGATIVE
Nitrite: NEGATIVE
Protein, ur: NEGATIVE mg/dL
Specific Gravity, Urine: 1.009 (ref 1.005–1.030)
pH: 6 (ref 5.0–8.0)

## 2016-01-17 LAB — COMPREHENSIVE METABOLIC PANEL
ALT: 47 U/L (ref 14–54)
AST: 61 U/L — ABNORMAL HIGH (ref 15–41)
Albumin: 3.8 g/dL (ref 3.5–5.0)
Alkaline Phosphatase: 69 U/L (ref 38–126)
Anion gap: 10 (ref 5–15)
BUN: 29 mg/dL — ABNORMAL HIGH (ref 6–20)
CO2: 28 mmol/L (ref 22–32)
Calcium: 9.3 mg/dL (ref 8.9–10.3)
Chloride: 98 mmol/L — ABNORMAL LOW (ref 101–111)
Creatinine, Ser: 1.22 mg/dL — ABNORMAL HIGH (ref 0.44–1.00)
GFR calc Af Amer: 43 mL/min — ABNORMAL LOW (ref >60)
GFR calc non Af Amer: 37 mL/min — ABNORMAL LOW (ref >60)
Glucose, Bld: 113 mg/dL — ABNORMAL HIGH (ref 65–99)
Potassium: 4.5 mmol/L (ref 3.5–5.1)
Sodium: 136 mmol/L (ref 135–145)
Total Bilirubin: 0.3 mg/dL (ref 0.3–1.2)
Total Protein: 7.2 g/dL (ref 6.5–8.1)

## 2016-01-17 MED ORDER — ACETAMINOPHEN 325 MG PO TABS
650.0000 mg | ORAL_TABLET | Freq: Once | ORAL | Status: AC
  Administered 2016-01-17: 650 mg via ORAL
  Filled 2016-01-17: qty 2

## 2016-01-17 MED ORDER — SODIUM CHLORIDE 0.9 % IV BOLUS (SEPSIS)
500.0000 mL | INTRAVENOUS | Status: AC
  Administered 2016-01-17: 500 mL via INTRAVENOUS

## 2016-01-17 NOTE — Progress Notes (Signed)
Cardiology Office Note Date:  01/18/2016  Patient ID:  Kathryn, Short 1921/11/16, MRN [id number] PCP:  Tracie Harrier, MD  Cardiologist:  Dr. Rockey Situ, MD    Chief Complaint: EF follow up for feeling off  History of Present Illness: Kathryn Short is a 82 y.o. female with history of PAF on Eliquis, chronic diastolic CHF, labile HTN, SSS s/p MDT PPM with generator change out 09/07/2015, SBO in 05/2014 conservatively managed, frequent falls, frequent episodes of chest pain intolerant to Imdur, dementia, uterine cancer, melanoma, cirrhosis, LE edema, OSA not on CPAP who presents for evaluation of head fullness and soft BP.  While she was admitted for SBO in 05/2014 she underwent echo that showed EF >55%, moderate pulmonary HTN, mild to moderate TR. She has been on torsemide and spironolactone for her CHF, cirrhosis, and LE edema. Last albumin was 3.8 in the ED on 8/7 for intractable headache. She was seen in the ED on 8/7 for head stuffiness and the daughter stating the patient looked "off." Head CT was negative. EKG non-acute. Labs were non-acute. She was hypotensive and given 1 L of IV fluids with improvement in her symptoms. She was advised to follow up with her PCP for further evaluation of nonspecific complaints.      Most recent device interrogation from 01/03/16 showed 6.9% At/AF burden for a total of 82 hours. She was continued on amiodarone and Eliquis. Prior device interrogation from 09/22/15 showed 0.3% Afib burden.   She was seen by her PCP on 8/8 for ED follow up. She was noted to have soft BP at [id number] systolic. Her losartan and Namenda were held. She had previously self held clonidine. She was noted to not be taking spironolactone, though was continuing to take torsemide. CXR was ordered and apparently negative. Echo was ordered through [facility].   She is accompanied by her daughter today who reports the patient continues to feel "swimmy headed." No increase in LE swelling, though she is not  wearing compression hose. No palpitations, SOB, or chest pain. No presyncope, or syncope. BP has been soft at home into the [id number] systolic.    Past Medical History:  Diagnosis Date  . Atrial fibrillation ([place])   . Bowel obstruction ([place])   . CHF (congestive heart failure) ([place])   . Fall   . GERD (gastroesophageal reflux disease)   . Hypertension   . Hypothyroidism    Treated  . Melanoma ([place])    Resected from back  . OSA (obstructive sleep apnea)    On BiPAP  . Uterine cancer [facility])    s/p radiation therapy in 1952    Past Surgical History:  Procedure Laterality Date  . CHOLECYSTECTOMY    . COLOSTOMY     For perforation  . Cytoscopy    . EP IMPLANTABLE DEVICE N/A 09/07/2015   Procedure:  PPM Generator Changeout;  Surgeon: Deboraha Sprang, MD;  Location: [place] CV LAB;  Service: Cardiovascular;  Laterality: N/A;  . INSERT / REPLACE / REMOVE PACEMAKER     Implantation-[employer]  . SKIN BIOPSY     face  . TOTAL ABDOMINAL HYSTERECTOMY W/ BILATERAL SALPINGOOPHORECTOMY      Current Outpatient Prescriptions  Medication Sig Dispense Refill  . acetaminophen (TYLENOL) 500 MG tablet Take 1,000 mg by mouth daily as needed for mild pain or headache.     . albuterol (PROVENTIL HFA;VENTOLIN HFA) 108 (90 BASE) MCG/ACT inhaler Inhale 2 puffs into the lungs every 6 (six) hours as needed for  wheezing or shortness of breath.     Marland Kitchen amiodarone (PACERONE) 200 MG tablet Take 1 tablet (200 mg total) by mouth daily. 90 tablet 3  . amoxicillin (AMOXIL) 500 MG capsule Take 500 mg by mouth 3 (three) times daily.    Marland Kitchen apixaban (ELIQUIS) 5 MG TABS tablet Take 1 tablet (5 mg total) by mouth 2 (two) times daily. 60 tablet 6  . b complex vitamins tablet Take 1 tablet by mouth daily.    . Carboxymethylcellul-Glycerin (LUBRICATING EYE DROPS OP) Place 2 drops into both eyes 2 (two) times daily.    . cefUROXime (CEFTIN) 250 MG tablet Take 250 mg by mouth 2 (two) times daily with a meal.    .  dextromethorphan-guaiFENesin (MUCINEX DM) 30-600 MG 12hr tablet Take 1 tablet by mouth daily.    Marland Kitchen lactulose (CHRONULAC) 10 GM/15ML solution Take 30 g by mouth daily.     Marland Kitchen levothyroxine (SYNTHROID, LEVOTHROID) 50 MCG tablet Take 50 mcg by mouth daily.     . metoprolol (LOPRESSOR) 50 MG tablet Take 1 tablet (50 mg total) by mouth 2 (two) times daily. 180 tablet 3  . montelukast (SINGULAIR) 10 MG tablet Take 10 mg by mouth at bedtime.    . Multiple Vitamin (MULTIVITAMIN) tablet Take 1 tablet by mouth daily.    . Multiple Vitamins-Minerals (PRESERVISION AREDS 2 PO) Take by mouth 2 (two) times daily.    . nitroGLYCERIN (NITROSTAT) 0.4 MG SL tablet Place 1 tablet (0.4 mg total) under the tongue every 5 (five) minutes as needed for chest pain. 25 tablet 11  . potassium chloride (K-DUR,KLOR-CON) 10 MEQ tablet Take 1 tablet (10 mEq total) by mouth daily as needed. (Patient taking differently: Take 10 mEq by mouth daily. ) 90 tablet 3  . Probiotic Product (PROBIOTIC DAILY PO) Take 1 capsule by mouth daily.     Marland Kitchen torsemide (DEMADEX) 20 MG tablet Take 1 tablet (20 mg total) by mouth 2 (two) times daily as needed. 180 tablet 3  . Vitamin D, Ergocalciferol, (DRISDOL) 50000 units CAPS capsule Take 50,000 Units by mouth every 7 (seven) days.     No current facility-administered medications for this visit.     Allergies:   Ace inhibitors; Ambien [zolpidem tartrate]; Bactrim [sulfamethoxazole-trimethoprim]; Digoxin and related; Hctz [hydrochlorothiazide]; and Lunesta [eszopiclone]   Social History:  The patient  reports that she has never smoked. She has never used smokeless tobacco. She reports that she does not drink alcohol or use drugs.   Family History:  The patient's Family history is unknown by patient.  ROS:   Review of Systems  Unable to perform ROS: Dementia     PHYSICAL EXAM:  VS:  BP 100/60 (BP Location: Left Arm, Patient Position: Sitting, Cuff Size: Normal)   Pulse 76   Ht 5\' 2"  (1.575 m)    Wt 164 lb 8 oz (74.6 kg)   BMI 30.09 kg/m  BMI: Body mass index is 30.09 kg/m.  Physical Exam  Constitutional: She is oriented to person, place, and time. She appears well-developed and well-nourished.  HENT:  Head: Normocephalic and atraumatic.  Eyes: Right eye exhibits no discharge. Left eye exhibits no discharge.  Neck: Normal range of motion. No JVD present.  Cardiovascular: Normal rate, S1 normal, S2 normal and normal heart sounds.  An irregularly irregular rhythm present. Exam reveals no distant heart sounds, no friction rub, no midsystolic click and no opening snap.   No murmur heard. Pulmonary/Chest: Effort normal and breath sounds normal. No  respiratory distress. She has no decreased breath sounds. She has no wheezes. She has no rales. She exhibits no tenderness.  Abdominal: Soft. She exhibits no distension. There is no tenderness.  Musculoskeletal: She exhibits no edema.  Trace pre-tibial edema along bilateral LE to the mid shins  Neurological: She is alert and oriented to person, place, and time.  Skin: Skin is warm and dry. No cyanosis. Nails show no clubbing.  Psychiatric: She has a normal mood and affect. Her speech is normal and behavior is normal. Judgment and thought content normal.     EKG:  Was not ordered today. Review of EKG from 01/16/16 shows Afib, 88 bpm, nonspecific lateral st/t changes  Recent Labs: 01/16/2016: ALT 47; BUN 29; Creatinine, Ser 1.22; Hemoglobin 13.0; Platelets 220; Potassium 4.5; Sodium 136  No results found for requested labs within last 8760 hours.   Estimated Creatinine Clearance: 27.8 mL/min (by C-G formula based on SCr of 1.22 mg/dL).   Wt Readings from Last 3 Encounters:  01/18/16 164 lb 8 oz (74.6 kg)  01/16/16 166 lb (75.3 kg)  12/02/15 165 lb 12 oz (75.2 kg)     Other studies reviewed: Additional studies/records reviewed today include: summarized above  ASSESSMENT AND PLAN:  1. PAF: Device interrogation showed increased Afib  burden on 7/25 when compared to interrogation in April. She has previously been quite symptomatic when she is in Afib. However, with her underlying vascular dementia this is more difficult to determine at this time as she only states is a "a piece of junk from the junk yard" today. I will increase her amiodarone to 400 mg bid x 1 week, 200 mg bid x 1 week, 200 mg daily thereafter. Continue Eliquis 5 mg bid. CHADS2VASc at least 5 (CHF, HTN, age x 2, female). I will ask that she be seen by EP for possible change in device settings.  Echo has been ordered by PCP through [facility].  2. Headache/swimmy headedness: Perhaps this is related to her soft BP/Afib vs non-cardiac etiology. Agree with holding losartan at this time. The patient's daughter had previously discontinued clonidine, no documented rebound hypertension seen. Address BP and Afib as above. Remaining per PCP.  3. Chronic diastolic CHF: She does not appear to be volume overloaded. She actually appear to be slightly pre-renal when looking at labs from 01/16/16. She has received IV fluids in the ED. Continue current dose of torsemide. Restart spironolactone when able from a BP standpoint given her cirrhosis. She will continue to have LE swelling and ascites given her liver disease. Last albumin 3.8 on 01/16/16.  4. Cirrhosis with LE edema and ascites: Fundamentally not a cardiac issue. Per PCP.  5. Dementia: Nameda held by PCP.   Disposition: F/u with Dr. Caryl Comes in 2-4 weeks   Current medicines are reviewed at length with the patient today.  The patient did not have any concerns regarding medicines.  Melvern Banker PA-C 01/18/2016 11:16 AM     [facility] [address] [place] [address] [place], [place] [address] [phone]

## 2016-01-17 NOTE — Discharge Instructions (Signed)
As we discussed, your workup today was reassuring.  Though we do not know exactly what is causing your symptoms, it appears that you have no emergent medical condition at this time are safe to go home and follow up as recommended in this paperwork.  You might be a little be volume depleted (slightly dehydrated), so drink plenty of clear fluids and follow up with your regular doctor; he may want to repeat some outpatient blood work to check your kidney function.  Please return immediately to the Emergency Department if you develop any new or worsening symptoms that concern you.

## 2016-01-18 ENCOUNTER — Encounter: Payer: Self-pay | Admitting: Physician Assistant

## 2016-01-18 ENCOUNTER — Ambulatory Visit (INDEPENDENT_AMBULATORY_CARE_PROVIDER_SITE_OTHER): Payer: Commercial Managed Care - HMO | Admitting: Physician Assistant

## 2016-01-18 VITALS — BP 100/60 | HR 76 | Ht 62.0 in | Wt 164.5 lb

## 2016-01-18 DIAGNOSIS — I480 Paroxysmal atrial fibrillation: Secondary | ICD-10-CM

## 2016-01-18 DIAGNOSIS — I495 Sick sinus syndrome: Secondary | ICD-10-CM | POA: Diagnosis not present

## 2016-01-18 DIAGNOSIS — I498 Other specified cardiac arrhythmias: Secondary | ICD-10-CM

## 2016-01-18 DIAGNOSIS — Z950 Presence of cardiac pacemaker: Secondary | ICD-10-CM | POA: Diagnosis not present

## 2016-01-18 DIAGNOSIS — I5032 Chronic diastolic (congestive) heart failure: Secondary | ICD-10-CM

## 2016-01-18 DIAGNOSIS — I10 Essential (primary) hypertension: Secondary | ICD-10-CM

## 2016-01-18 DIAGNOSIS — R0989 Other specified symptoms and signs involving the circulatory and respiratory systems: Secondary | ICD-10-CM

## 2016-01-18 DIAGNOSIS — K7460 Unspecified cirrhosis of liver: Secondary | ICD-10-CM

## 2016-01-18 LAB — URINE CULTURE
CULTURE: NO GROWTH
Special Requests: NORMAL

## 2016-01-18 MED ORDER — AMIODARONE HCL 200 MG PO TABS: 200.0000 mg | ORAL_TABLET | Freq: Every day | ORAL | 3 refills | Status: DC

## 2016-01-18 NOTE — Patient Instructions (Addendum)
Medication Instructions:  Your physician has recommended you make the following change in your medication:  TAKE amiodarone 400mg  twice daily for one week then  REDUCE amiodarone to 200mg  twice daily for one week then REDUCE amiodarone to 200mg  once daily.     Labwork: none  Testing/Procedures: none  Follow-Up: Your physician recommends that you schedule a follow-up appointment with Dr. Caryl Comes for device check.    Any Other Special Instructions Will Be Listed Below (If Applicable).     If you need a refill on your cardiac medications before your next appointment, please call your pharmacy.

## 2016-01-19 ENCOUNTER — Telehealth: Payer: Self-pay | Admitting: Internal Medicine

## 2016-01-19 NOTE — Telephone Encounter (Signed)
Contacted Ms. Kathryn Short. I explained that if her mother is feeling as if she will pass out and SOB and this is getting worse that she may need to go to the ER. She explains that she has been to the  ER on 8/7, she saw Christell Faith, PA yesterday and that this keeps getting worse and [name] keeps saying she is going to die. I have encouraged her to go to the ER if this is the case. Otherwise, she can send a transmission for PPM function evaluation and we will be back in touch with her after receipt of PPM transmission. She is agreeable to send [facility] transmission.

## 2016-01-19 NOTE — Telephone Encounter (Signed)
Remote transmission reviewed. Normal device function. 9.3% AT/AF burden--persistent since 01/15/16 + Amio/Eliquis.   I informed Ms.Ruff about pacemaker results. She asked what else could be causing the patient to feel as though she's going to pass out. I told patient that I could not tell her why her mother was experiencing that sensation. I told her that if this persists then she may need to go to the ER for further evaluation.   Ms.Ruff voiced understanding.

## 2016-01-19 NOTE — Telephone Encounter (Signed)
New message       Pt c/o Shortness Of Breath: STAT if SOB developed within the last 24 hours or pt is noticeably SOB on the phone  1. Are you currently SOB (can you hear that pt is SOB on the phone)?  2. How long have you been experiencing SOB? For a week 3. Are you SOB when sitting or when up moving around?  4. Are you currently experiencing any other symptoms? Lightheaded, and feels like she is going to pass out.  Daughter says pt has had lots of test recently.  Daughter feels like her pacemaker needs to be "reset"  Daughter insist someone return the call ASAP

## 2016-01-20 ENCOUNTER — Telehealth: Payer: Self-pay | Admitting: Internal Medicine

## 2016-01-20 NOTE — Telephone Encounter (Signed)
200 mg daily. Monitor heart rate and let us know first of the week if she is still tachycardic.

## 2016-01-20 NOTE — Telephone Encounter (Signed)
Ok to take lower dose. Her poor sleep could also be due to her underlying dementia though and not the medication.

## 2016-01-20 NOTE — Telephone Encounter (Signed)
Patients daughter reports that her mother started taking 400 mg amiodarone yesterday and took both AM and PM dose. She states that her mother couldn't sleep, was very nervous, and just could not tolerate that dosage. This AM she took only one pill which is 200 mg. Prior to this she took 1/2 tablet 100 mg for years ordered by Dr. Caryl Comes. So she wanted to know if this dosage could be decreased because she just didn't tolerate it well. Let her know that I would forward this to [employer] PA and get his recommendations and would call her back. She verbalized understanding and had no further questions at this time.

## 2016-01-20 NOTE — Telephone Encounter (Signed)
Pt c/o medication issue:  1. Name of Medication: Amlodipine   2. How are you currently taking this medication (dosage and times per day)? 2 In the morning and 2 at night which is 200 mg  3. Are you having a reaction (difficulty breathing--STAT)?   4. What is your medication issue? Pt was told to up the dose but now pt can not sleep at night Was up all night nervous.   Please advise.  Thinks it may be too much Was taking about 1/2 a pill once a day which was about 100 mg

## 2016-01-20 NOTE — Telephone Encounter (Signed)
Spoke with patients daughter and instructed her to have patient take Amiodarone 200 mg daily and monitor heart rate. Instructed her to call us back if she remains tachycardic or if she has any further problems or symptoms. She verbalized understanding of our conversation, agreement with plan of care, and had no further questions.

## 2016-01-24 ENCOUNTER — Encounter: Payer: Self-pay | Admitting: Internal Medicine

## 2016-01-24 ENCOUNTER — Ambulatory Visit (INDEPENDENT_AMBULATORY_CARE_PROVIDER_SITE_OTHER): Payer: Commercial Managed Care - HMO | Admitting: Internal Medicine

## 2016-01-24 VITALS — BP 127/81 | HR 62 | Ht 62.0 in | Wt 166.0 lb

## 2016-01-24 DIAGNOSIS — F329 Major depressive disorder, single episode, unspecified: Secondary | ICD-10-CM | POA: Diagnosis not present

## 2016-01-24 DIAGNOSIS — I471 Supraventricular tachycardia: Secondary | ICD-10-CM

## 2016-01-24 DIAGNOSIS — I480 Paroxysmal atrial fibrillation: Secondary | ICD-10-CM | POA: Diagnosis not present

## 2016-01-24 DIAGNOSIS — I9589 Other hypotension: Secondary | ICD-10-CM | POA: Diagnosis not present

## 2016-01-24 DIAGNOSIS — Z950 Presence of cardiac pacemaker: Secondary | ICD-10-CM | POA: Diagnosis not present

## 2016-01-24 DIAGNOSIS — K7460 Unspecified cirrhosis of liver: Secondary | ICD-10-CM | POA: Diagnosis not present

## 2016-01-24 DIAGNOSIS — I5032 Chronic diastolic (congestive) heart failure: Secondary | ICD-10-CM | POA: Diagnosis not present

## 2016-01-24 DIAGNOSIS — R600 Localized edema: Secondary | ICD-10-CM | POA: Diagnosis not present

## 2016-01-24 DIAGNOSIS — I495 Sick sinus syndrome: Secondary | ICD-10-CM | POA: Diagnosis not present

## 2016-01-24 DIAGNOSIS — E079 Disorder of thyroid, unspecified: Secondary | ICD-10-CM | POA: Diagnosis not present

## 2016-01-24 DIAGNOSIS — N390 Urinary tract infection, site not specified: Secondary | ICD-10-CM | POA: Diagnosis not present

## 2016-01-24 DIAGNOSIS — I10 Essential (primary) hypertension: Secondary | ICD-10-CM | POA: Diagnosis not present

## 2016-01-24 DIAGNOSIS — F419 Anxiety disorder, unspecified: Secondary | ICD-10-CM | POA: Diagnosis not present

## 2016-01-24 DIAGNOSIS — K219 Gastro-esophageal reflux disease without esophagitis: Secondary | ICD-10-CM | POA: Diagnosis not present

## 2016-01-24 DIAGNOSIS — J90 Pleural effusion, not elsewhere classified: Secondary | ICD-10-CM | POA: Diagnosis not present

## 2016-01-24 LAB — CUP PACEART INCLINIC DEVICE CHECK
Battery Impedance: 100 Ohm
Battery Voltage: 2.8 V
Brady Statistic AP VP Percent: 0 %
Brady Statistic AP VS Percent: 93 %
Brady Statistic AS VS Percent: 6 %
Implantable Lead Implant Date: 20080813
Implantable Lead Location: 753859
Implantable Lead Location: 753860
Implantable Lead Model: 5076
Lead Channel Impedance Value: 361 Ohm
Lead Channel Impedance Value: 417 Ohm
Lead Channel Pacing Threshold Amplitude: 1 V
Lead Channel Pacing Threshold Pulse Width: 0.4 ms
Lead Channel Sensing Intrinsic Amplitude: 5.6 mV
Lead Channel Setting Pacing Amplitude: 2 V
Lead Channel Setting Pacing Pulse Width: 0.4 ms
MDC IDC LEAD IMPLANT DT: 20080813
MDC IDC MSMT BATTERY REMAINING LONGEVITY: 129 mo
MDC IDC MSMT LEADCHNL RV PACING THRESHOLD AMPLITUDE: 1.25 V
MDC IDC MSMT LEADCHNL RV PACING THRESHOLD PULSEWIDTH: 0.4 ms
MDC IDC SESS DTM: 20170815150936
MDC IDC SET LEADCHNL RV PACING AMPLITUDE: 2.5 V
MDC IDC SET LEADCHNL RV SENSING SENSITIVITY: 2 mV
MDC IDC STAT BRADY AS VP PERCENT: 1 %

## 2016-01-24 NOTE — Progress Notes (Signed)
Patient Care Team: Tracie Harrier, MD as PCP - General (Internal Medicine) Minna Merritts, MD as Consulting Physician (Cardiology)   HPI  Kathryn Short is a 82 y.o. female seen in followup for atrial fibrillation and sinus bradycardia with prior pacemaker implantation.  She was previously on warfarin but because of falls , had been taking aspirin. This decison per primary Cardiology. More recently she has been treated with apixoban. She's also been treated with amiodarone  She underwent pacemaker generator replacement 3/17  She's been seen over recent months with interval further down titration of antihypertensives and afterload reduction because of an unusual sensation in her head describes sort of like dizziness and lightheadedness which is been described to blood pressures in the 100-120 range; she feels considerably better with a blood pressure 140 range  She was started on sertraline by her PCP last week and she is 500% better according to her daughter  She continues with pedal edema. She is not very ambulatory.  2014 Echo at [facility] >>ejection fraction 50-55%, moderate LVH, mild to moderate TR, right ventricular systolic pressure 40 mm of mercury, mild to moderate aortic valve insufficiency 2015 Myoview EF normal  She has no history of coronary disease. She recalls having had a stress test at [facility]; we are unable to find records.    Past Medical History:  Diagnosis Date  . Atrial fibrillation ([place])   . Bowel obstruction ([place])   . CHF (congestive heart failure) ([place])   . Fall   . GERD (gastroesophageal reflux disease)   . Hypertension   . Hypothyroidism    Treated  . Melanoma ([place])    Resected from back  . OSA (obstructive sleep apnea)    On BiPAP  . Uterine cancer [facility])    s/p radiation therapy in 1952    Past Surgical History:  Procedure Laterality Date  . CHOLECYSTECTOMY    . COLOSTOMY     For perforation  . Cytoscopy    . EP IMPLANTABLE DEVICE N/A 09/07/2015    Procedure:  PPM Generator Changeout;  Surgeon: Deboraha Sprang, MD;  Location: [facility];  Service: Cardiovascular;  Laterality: N/A;  . INSERT / REPLACE / REMOVE PACEMAKER     Implantation-Medtronic  . SKIN BIOPSY     face  . TOTAL ABDOMINAL HYSTERECTOMY W/ BILATERAL SALPINGOOPHORECTOMY      Current Outpatient Prescriptions  Medication Sig Dispense Refill  . acetaminophen (TYLENOL) 500 MG tablet Take 1,000 mg by mouth daily as needed for mild pain or headache.     . albuterol (PROVENTIL HFA;VENTOLIN HFA) 108 (90 BASE) MCG/ACT inhaler Inhale 2 puffs into the lungs every 6 (six) hours as needed for wheezing or shortness of breath.     Marland Kitchen amiodarone (PACERONE) 200 MG tablet Take 1 tablet (200 mg total) by mouth daily. 90 tablet 3  . apixaban (ELIQUIS) 5 MG TABS tablet Take 1 tablet (5 mg total) by mouth 2 (two) times daily. 60 tablet 6  . b complex vitamins tablet Take 1 tablet by mouth daily.    . Carboxymethylcellul-Glycerin (LUBRICATING EYE DROPS OP) Place 2 drops into both eyes 2 (two) times daily.    . cefUROXime (CEFTIN) 250 MG tablet Take 250 mg by mouth daily as needed.     . clonazePAM (KLONOPIN) 0.5 MG tablet Take 0.5 mg by mouth 2 (two) times daily as needed for anxiety.    Marland Kitchen lactulose (CHRONULAC) 10 GM/15ML solution Take 30 g by mouth daily.     Marland Kitchen  levothyroxine (SYNTHROID, LEVOTHROID) 50 MCG tablet Take 50 mcg by mouth daily.     . metoprolol (LOPRESSOR) 50 MG tablet Take 1 tablet (50 mg total) by mouth 2 (two) times daily. 180 tablet 3  . mometasone-formoterol (DULERA) 100-5 MCG/ACT AERO Inhale 2 puffs into the lungs 2 (two) times daily.    . montelukast (SINGULAIR) 10 MG tablet Take 10 mg by mouth at bedtime.    . Multiple Vitamin (MULTIVITAMIN) tablet Take 1 tablet by mouth daily.    . Multiple Vitamins-Minerals (PRESERVISION AREDS 2 PO) Take by mouth 2 (two) times daily.    . nitroGLYCERIN (NITROSTAT) 0.4 MG SL tablet Place 1 tablet (0.4 mg total) under the tongue  every 5 (five) minutes as needed for chest pain. 25 tablet 11  . potassium chloride (K-DUR,KLOR-CON) 10 MEQ tablet Take 1 tablet (10 mEq total) by mouth daily as needed. (Patient taking differently: Take 10 mEq by mouth daily. ) 90 tablet 3  . Probiotic Product (PROBIOTIC DAILY PO) Take 1 capsule by mouth daily.     . sertraline (ZOLOFT) 25 MG tablet Take 25 mg by mouth daily.    Marland Kitchen spironolactone (ALDACTONE) 25 MG tablet Take 25 mg by mouth daily.    Marland Kitchen torsemide (DEMADEX) 20 MG tablet Take 1 tablet (20 mg total) by mouth 2 (two) times daily as needed. (Patient taking differently: Take 20 mg by mouth. Takes 1 tablet by mouth Mon-Friday, Takes 1.5 tablet Sat./Sun.) 180 tablet 3  . Vitamin D, Ergocalciferol, (DRISDOL) 50000 units CAPS capsule Take 50,000 Units by mouth every 7 (seven) days.     No current facility-administered medications for this visit.     Allergies  Allergen Reactions  . Ace Inhibitors Other (See Comments)    Unknown  . Ambien [Zolpidem Tartrate] Other (See Comments)    Dizzy  . Bactrim [Sulfamethoxazole-Trimethoprim] Other (See Comments)    Unknown  . Digoxin And Related Other (See Comments)    Didn't do well  . Hctz [Hydrochlorothiazide] Other (See Comments)    Unknown  . Lunesta [Eszopiclone] Other (See Comments)    Dizzy    Review of Systems negative except from HPI and PMH  Physical Exam BP 127/81 (BP Location: Right Arm, Patient Position: Sitting, Cuff Size: Normal)   Pulse 62   Ht 5\' 2"  (1.575 m)   Wt 166 lb (75.3 kg)   BMI 30.36 kg/m  Well developed and well nourished in no acute distress Well developed and nourished in no acute distress HENT normal Neck supple with JVP-<8 Clear Pocket caudally displaced, they say i am responsible for this mess. Regular rate and rhythm, no murmurs or gallops Abd-soft with active BS No Clubbing cyanosis 1+edema  Wearing compression stockings Skin-warm and dry A & Oriented  Grossly normal sensory and motor  function  ECG demonstrates ventricular pacing at 65 no discernible P wave is noted    Assessment and  Plan  Hypertension  Atrial fibrillation-Paroxysmal   HFpEF  Sinus node dysfunction-symptomatic     Pacemaker-Medtronic The patient's device was interrogated.  The information was reviewed.      She has had some interval atrial fibrillation. Rates are reasonably controlled.    She is volume overloaded. We will increase her Demadex from 20--40 daily for 3 days. She is also on Aldactone.  Apixaban dose is correct given her weight and creatinine

## 2016-01-24 NOTE — Patient Instructions (Signed)
Medication Instructions: - Your physician has recommended you make the following change in your medication:  1) Increase torsemide to 20 mg - take two pills (40 mg) by mouth every morning x 3 days, then resume normal dosing  ** if you take two pills tomorrow morning and urination does not improve, then take 3 pills the next morning.**  Labwork: - none  Procedures/Testing: - none  Follow-Up: - Remote monitoring is used to monitor your Pacemaker of ICD from home. This monitoring reduces the number of office visits required to check your device to one time per year. It allows [place] to keep an eye on the functioning of your device to ensure it is working properly. You are scheduled for a device check from home on 04/27/16. You may send your transmission at any time that day. If you have a wireless device, the transmission will be sent automatically. After your physician reviews your transmission, you will receive a postcard with your next transmission date.  - Your physician wants you to follow-up in: 1 year with Dr. Caryl Comes. You will receive a reminder letter in the mail two months in advance. If you don't receive a letter, please call our office to schedule the follow-up appointment.   Any Additional Special Instructions Will Be Listed Below (If Applicable).     If you need a refill on your cardiac medications before your next appointment, please call your pharmacy.

## 2016-01-26 ENCOUNTER — Encounter: Payer: Self-pay | Admitting: Internal Medicine

## 2016-02-03 ENCOUNTER — Ambulatory Visit (INDEPENDENT_AMBULATORY_CARE_PROVIDER_SITE_OTHER): Payer: Commercial Managed Care - HMO | Admitting: Cardiovascular Disease

## 2016-02-03 ENCOUNTER — Encounter: Payer: Self-pay | Admitting: Cardiovascular Disease

## 2016-02-03 VITALS — BP 138/54 | HR 61 | Ht 62.0 in | Wt 163.0 lb

## 2016-02-03 DIAGNOSIS — I480 Paroxysmal atrial fibrillation: Secondary | ICD-10-CM | POA: Diagnosis not present

## 2016-02-03 DIAGNOSIS — R399 Unspecified symptoms and signs involving the genitourinary system: Secondary | ICD-10-CM

## 2016-02-03 DIAGNOSIS — I10 Essential (primary) hypertension: Secondary | ICD-10-CM | POA: Diagnosis not present

## 2016-02-03 DIAGNOSIS — R600 Localized edema: Secondary | ICD-10-CM

## 2016-02-03 DIAGNOSIS — N179 Acute kidney failure, unspecified: Secondary | ICD-10-CM

## 2016-02-03 NOTE — Patient Instructions (Addendum)
Medication Instructions:   Continue torsemide one daily, Monday to Saturday None on Sunday Drink more  Labwork:  No new labs  Testing/Procedures:  No testing needed   Follow-Up: It was a pleasure seeing you in the office today. Please call us if you have new issues that need to be addressed before your next appt.  [phone]  Your physician wants you to follow-up in: 3 months.  You will receive a reminder letter in the mail two months in advance. If you don't receive a letter, please call our office to schedule the follow-up appointment.  If you need a refill on your cardiac medications before your next appointment, please call your pharmacy.

## 2016-02-03 NOTE — Progress Notes (Signed)
Cardiology Office Note  Date:  02/03/2016   ID:  Kathryn Short, DOB [birth year], MRN [id number]  PCP:  Tracie Harrier, MD   Chief Complaint  Patient presents with  . Other    2 month f/u c/o left edema and discuss diuretics. Pt would like to discuss watchman device Meds reviewed verbally with pt.    HPI:  Kathryn Short is an 82 year old woman with paroxysmal atrial fibrillation (last episode in March [id number]), diastolic CHF on a low-dose diuretic daily, labile hypertension, sick sinus syndrome, pacemaker placement followed by Dr. Caryl Comes, who presented to the hospital [facility] on 02/16/2012 in atrial fibrillation. She presents today for routine followup for her blood pressure and weight gain him a leg edema. Her past surgical history includes open cholecystectomy, partial colon resection and colostomy, appendectomy, hysterectomy, oophorectomy, GYN malignancy treated with radiation in the 1950s Recent bowel obstruction December 2015 She presents for routine follow-up of her blood pressure, diastolic CHF and atrial fibrillation  In follow-up today she presents with her daughter Daughter reports that on 01/16/16, she presented to the emergency room, did not feel well, general malaise with sinus pressure Head CT was unrevealing Urine ok BMP: prerenal state Was discharged home  Clonidine and losartan held for low blood pressure  Seen in our clinic 8/9 Noted to have increased atrial fibrillation burden on pacer download January 03 2016 recommended she increase her amiodarone, is on anticoagulation Low BP, head fullness Recommended that she restart her Aldactone as blood pressure improves  Seen by Dr. Caryl Comes August 15, recommended she increase her torsemide for several days Seen by primary care 8/15: urine UTI No Urine cx available to date BMP shows creatinine continues to run higher consistent with prerenal state Taking torsemide daily with 1.5 torsemide on weekends, Aldactone added every other  day  Daughter reports she is not drinking much, eating less, still not herself Leg edema has resolved, minimal on the left, chronic   Previous CT scan done by primary care showing small to moderate right pleural effusion, evidence of cirrhosis Mention of cirrhosis back on CT scan in 2015 Mildly elevated LFTs over the past year  EKG on today's visit shows paced rhythm, rate 61 bpm  Other past medical history reviewed in the hospital 05/25/2014, with small bowel obstruction. She was placed on bowel rest, IV fluids and she passed her bowels. She did not require surgery  Echocardiogram done in the hospital 05/25/2014 showing ejection fraction greater than 55%, moderate pulmonary hypertension, mild to moderate TR  Earlier in 2015, she had atrial tachycardia and pacer was tracking the atrial rhythm, Her device was reprogrammed in the office for DDI  She was continued on her beta blocker and amiodarone and rhythm broke, converting back to normal sinus rhythm. Pacemaker has been since changed back to her prior settings  She is reluctant to use a walker, still uses a cane. Anticoagulation discussed previouslywith the patient and her daughter. They are not interested. They preferred to stay on aspirin  history of falls with trauma requiring stitches. None recently Patient and daughter are afraid of her fall risk, other complications from anticoagulation. Currently not on anticoagulation at their request. They are aware of risks and benefits associated with arrhythmia and stroke.  Previous lab work; Total cholesterol 189, LDL 104, hematocrit 38, creatinine 0.8  previous visits to the emergency room with chest pain, dizziness, malaise. She had a problem on isosorbide in the past. Feelings of insomnia, and anxious feeling, chest pain episodes at night  that wake her from sleep, weakness, nausea, dizziness. She has obstructive sleep apnea and does not use her BiPAP at  nighttime  Echocardiogram in the hospital showed ejection fraction 50-55%, moderate LVH, mild to moderate TR, right ventricular systolic pressure 40 mm of mercury, mild to moderate aortic valve insufficiency   PMH:   has a past medical history of Atrial fibrillation ([place]); Bowel obstruction (HCC); CHF (congestive heart failure) ([place]); Fall; GERD (gastroesophageal reflux disease); Hypertension; Hypothyroidism; Melanoma ([place]); OSA (obstructive sleep apnea); and Uterine cancer ([place]).  PSH:    Past Surgical History:  Procedure Laterality Date  . CHOLECYSTECTOMY    . COLOSTOMY     For perforation  . Cytoscopy    . EP IMPLANTABLE DEVICE N/A 09/07/2015   Procedure:  PPM Generator Changeout;  Surgeon: Deboraha Sprang, MD;  Location: [facility];  Service: Cardiovascular;  Laterality: N/A;  . INSERT / REPLACE / REMOVE PACEMAKER     Implantation-Medtronic  . SKIN BIOPSY     face  . TOTAL ABDOMINAL HYSTERECTOMY W/ BILATERAL SALPINGOOPHORECTOMY      Current Outpatient Prescriptions  Medication Sig Dispense Refill  . acetaminophen (TYLENOL) 500 MG tablet Take 1,000 mg by mouth daily as needed for mild pain or headache.     . albuterol (PROVENTIL HFA;VENTOLIN HFA) 108 (90 BASE) MCG/ACT inhaler Inhale 2 puffs into the lungs every 6 (six) hours as needed for wheezing or shortness of breath.     Marland Kitchen amiodarone (PACERONE) 200 MG tablet Take 1 tablet (200 mg total) by mouth daily. 90 tablet 3  . apixaban (ELIQUIS) 5 MG TABS tablet Take 1 tablet (5 mg total) by mouth 2 (two) times daily. 60 tablet 6  . b complex vitamins tablet Take 1 tablet by mouth daily.    . Carboxymethylcellul-Glycerin (LUBRICATING EYE DROPS OP) Place 2 drops into both eyes 2 (two) times daily.    . cefUROXime (CEFTIN) 250 MG tablet Take 250 mg by mouth daily as needed.     . clonazePAM (KLONOPIN) 0.5 MG tablet Take 0.5 mg by mouth 2 (two) times daily as needed for anxiety.    Marland Kitchen lactulose (CHRONULAC) 10 GM/15ML solution Take 30  g by mouth daily.     Marland Kitchen levothyroxine (SYNTHROID, LEVOTHROID) 50 MCG tablet Take 50 mcg by mouth daily.     . metoprolol (LOPRESSOR) 50 MG tablet Take 1 tablet (50 mg total) by mouth 2 (two) times daily. 180 tablet 3  . mometasone-formoterol (DULERA) 100-5 MCG/ACT AERO Inhale 2 puffs into the lungs 2 (two) times daily as needed.     . montelukast (SINGULAIR) 10 MG tablet Take 10 mg by mouth at bedtime.    . Multiple Vitamin (MULTIVITAMIN) tablet Take 1 tablet by mouth daily.    . Multiple Vitamins-Minerals (PRESERVISION AREDS 2 PO) Take by mouth 2 (two) times daily.    . nitroGLYCERIN (NITROSTAT) 0.4 MG SL tablet Place 1 tablet (0.4 mg total) under the tongue every 5 (five) minutes as needed for chest pain. 25 tablet 11  . potassium chloride (K-DUR,KLOR-CON) 10 MEQ tablet Take 1 tablet (10 mEq total) by mouth daily as needed. (Patient taking differently: Take 10 mEq by mouth daily. ) 90 tablet 3  . Probiotic Product (PROBIOTIC DAILY PO) Take 1 capsule by mouth daily.     . sertraline (ZOLOFT) 25 MG tablet Take 25 mg by mouth daily.    Marland Kitchen spironolactone (ALDACTONE) 25 MG tablet Take 25 mg by mouth every other day.     Marland Kitchen  torsemide (DEMADEX) 20 MG tablet as directed. Take one tablet (20 mg) by mouth once daily M-F, take 1 & 1/2 tablets (30 mg) once daily on Sat/Sun     . Vitamin D, Ergocalciferol, (DRISDOL) 50000 units CAPS capsule Take 50,000 Units by mouth every 7 (seven) days.     No current facility-administered medications for this visit.      Allergies:   Ace inhibitors; Ambien [zolpidem tartrate]; Bactrim [sulfamethoxazole-trimethoprim]; Digoxin and related; Hctz [hydrochlorothiazide]; and Lunesta [eszopiclone]   Social History:  The patient  reports that she has never smoked. She has never used smokeless tobacco. She reports that she does not drink alcohol or use drugs.   Family History:   Family history is unknown by patient.    Review of Systems: Review of Systems  Constitutional:  Positive for malaise/fatigue.  Respiratory: Negative.   Cardiovascular: Negative.   Gastrointestinal: Negative.   Musculoskeletal: Negative.        Gait instability  Neurological: Negative.   Psychiatric/Behavioral: Negative.   All other systems reviewed and are negative.    PHYSICAL EXAM: VS:  BP (!) 138/54 (BP Location: Right Arm, Patient Position: Sitting, Cuff Size: Large)   Pulse 61   Ht 5\' 2"  (1.575 m)   Wt 163 lb (73.9 kg)   BMI 29.81 kg/m  , BMI Body mass index is 29.81 kg/m. GEN: Well nourished, well developed, in no acute distress, presenting in a wheelchair   HEENT: normal  Neck: no JVD, carotid bruits, or masses Cardiac: RRR; no murmurs, rubs, or gallops, trace edema left lower extremity. Nonpitting Respiratory:  clear to auscultation bilaterally, normal work of breathing GI: soft, nontender, nondistended, + BS MS: no deformity or atrophy  Skin: warm and dry, no rash Neuro:  Strength and sensation are intact Psych: euthymic mood, full affect    Recent Labs: 01/16/2016: ALT 47; BUN 29; Creatinine, Ser 1.22; Hemoglobin 13.0; Platelets 220; Potassium 4.5; Sodium 136    Lipid Panel No results found for: CHOL, HDL, LDLCALC, TRIG    Wt Readings from Last 3 Encounters:  02/03/16 163 lb (73.9 kg)  01/24/16 166 lb (75.3 kg)  01/18/16 164 lb 8 oz (74.6 kg)       ASSESSMENT AND PLAN:  Paroxysmal atrial fibrillation (HCC) - Plan: EKG 12-Lead Rate low 60s amiodarone loaded approximately 10 days ago when seen in clinic to decrease atrial fibrillation burden, seen by Dr. Caryl Comes last week  UTI symptoms - Plan: Urine Culture, Urinalysis, Complete We have provided her with new order for UA and culture No culture seen from UA done August 15 at which time she appeared to have a urinary tract infection Patient's daughter will call primary care early next week to see if this is needed or if culture has come back  Essential hypertension Blood pressure running low, likely  secondary to mild dehydration Creatinine continues to trend upwards well above her baseline  Localized edema Edema has essentially resolved, venous insufficiency likely cause of minimal edema on the left, nonpitting  Acute renal failure, unspecified acute renal failure type ([place]) Creatinine up to 1.5 which is above her baseline Blood pressure is low, edema resolved Recommended she decrease torsemide down to 20 ng daily Monday through Saturday, no torsemide on Sundays. Increase fluids a mildly Repeat lab work with primary care next month   Total encounter time more than 25 minutes  Greater than 50% was spent in counseling and coordination of care with the patient   Disposition:   F/U  6 months   Orders Placed This Encounter  Procedures  . Urine Culture  . Urinalysis, Complete  . EKG 12-Lead     Signed, Esmond Plants, M.D., Ph.D. 02/03/2016  [place], [place]

## 2016-02-06 ENCOUNTER — Other Ambulatory Visit: Payer: Self-pay

## 2016-02-06 DIAGNOSIS — R399 Unspecified symptoms and signs involving the genitourinary system: Secondary | ICD-10-CM

## 2016-02-07 DIAGNOSIS — G4733 Obstructive sleep apnea (adult) (pediatric): Secondary | ICD-10-CM | POA: Diagnosis not present

## 2016-02-07 DIAGNOSIS — I4891 Unspecified atrial fibrillation: Secondary | ICD-10-CM | POA: Diagnosis not present

## 2016-02-07 DIAGNOSIS — I10 Essential (primary) hypertension: Secondary | ICD-10-CM | POA: Diagnosis not present

## 2016-02-07 DIAGNOSIS — M6281 Muscle weakness (generalized): Secondary | ICD-10-CM | POA: Diagnosis not present

## 2016-02-07 DIAGNOSIS — M629 Disorder of muscle, unspecified: Secondary | ICD-10-CM | POA: Diagnosis not present

## 2016-02-10 ENCOUNTER — Encounter: Payer: Self-pay | Admitting: Internal Medicine

## 2016-02-10 DIAGNOSIS — F419 Anxiety disorder, unspecified: Secondary | ICD-10-CM | POA: Diagnosis not present

## 2016-02-10 DIAGNOSIS — I480 Paroxysmal atrial fibrillation: Secondary | ICD-10-CM | POA: Diagnosis not present

## 2016-02-10 DIAGNOSIS — K7460 Unspecified cirrhosis of liver: Secondary | ICD-10-CM | POA: Diagnosis not present

## 2016-02-10 DIAGNOSIS — I10 Essential (primary) hypertension: Secondary | ICD-10-CM | POA: Diagnosis not present

## 2016-02-10 DIAGNOSIS — I5032 Chronic diastolic (congestive) heart failure: Secondary | ICD-10-CM | POA: Diagnosis not present

## 2016-02-10 DIAGNOSIS — F329 Major depressive disorder, single episode, unspecified: Secondary | ICD-10-CM | POA: Diagnosis not present

## 2016-02-10 DIAGNOSIS — R195 Other fecal abnormalities: Secondary | ICD-10-CM | POA: Diagnosis not present

## 2016-02-10 DIAGNOSIS — F3341 Major depressive disorder, recurrent, in partial remission: Secondary | ICD-10-CM | POA: Diagnosis not present

## 2016-02-27 DIAGNOSIS — R195 Other fecal abnormalities: Secondary | ICD-10-CM | POA: Diagnosis not present

## 2016-02-28 DIAGNOSIS — R21 Rash and other nonspecific skin eruption: Secondary | ICD-10-CM | POA: Diagnosis not present

## 2016-02-29 DIAGNOSIS — H903 Sensorineural hearing loss, bilateral: Secondary | ICD-10-CM | POA: Diagnosis not present

## 2016-02-29 DIAGNOSIS — H6123 Impacted cerumen, bilateral: Secondary | ICD-10-CM | POA: Diagnosis not present

## 2016-03-03 DIAGNOSIS — M79605 Pain in left leg: Secondary | ICD-10-CM | POA: Diagnosis not present

## 2016-03-05 DIAGNOSIS — D582 Other hemoglobinopathies: Secondary | ICD-10-CM | POA: Diagnosis not present

## 2016-03-06 DIAGNOSIS — I10 Essential (primary) hypertension: Secondary | ICD-10-CM | POA: Diagnosis not present

## 2016-03-06 DIAGNOSIS — Z23 Encounter for immunization: Secondary | ICD-10-CM | POA: Diagnosis not present

## 2016-03-06 DIAGNOSIS — K219 Gastro-esophageal reflux disease without esophagitis: Secondary | ICD-10-CM | POA: Diagnosis not present

## 2016-03-06 DIAGNOSIS — L03116 Cellulitis of left lower limb: Secondary | ICD-10-CM | POA: Diagnosis not present

## 2016-03-06 DIAGNOSIS — I5032 Chronic diastolic (congestive) heart failure: Secondary | ICD-10-CM | POA: Diagnosis not present

## 2016-03-09 DIAGNOSIS — M6281 Muscle weakness (generalized): Secondary | ICD-10-CM | POA: Diagnosis not present

## 2016-03-09 DIAGNOSIS — G4733 Obstructive sleep apnea (adult) (pediatric): Secondary | ICD-10-CM | POA: Diagnosis not present

## 2016-03-09 DIAGNOSIS — M629 Disorder of muscle, unspecified: Secondary | ICD-10-CM | POA: Diagnosis not present

## 2016-03-09 DIAGNOSIS — I4891 Unspecified atrial fibrillation: Secondary | ICD-10-CM | POA: Diagnosis not present

## 2016-03-09 DIAGNOSIS — I10 Essential (primary) hypertension: Secondary | ICD-10-CM | POA: Diagnosis not present

## 2016-03-14 ENCOUNTER — Ambulatory Visit
Admission: RE | Admit: 2016-03-14 | Discharge: 2016-03-14 | Disposition: A | Discharge status: Home / Self Care | Payer: Commercial Managed Care - HMO | Source: Ambulatory Visit | Attending: Internal Medicine | Admitting: Internal Medicine

## 2016-03-14 ENCOUNTER — Other Ambulatory Visit: Payer: Self-pay | Admitting: Internal Medicine

## 2016-03-14 DIAGNOSIS — M79605 Pain in left leg: Secondary | ICD-10-CM | POA: Diagnosis not present

## 2016-03-14 DIAGNOSIS — M7989 Other specified soft tissue disorders: Secondary | ICD-10-CM | POA: Diagnosis not present

## 2016-03-16 ENCOUNTER — Other Ambulatory Visit: Payer: Self-pay | Admitting: Internal Medicine

## 2016-03-16 DIAGNOSIS — Z1231 Encounter for screening mammogram for malignant neoplasm of breast: Secondary | ICD-10-CM

## 2016-03-16 DIAGNOSIS — K219 Gastro-esophageal reflux disease without esophagitis: Secondary | ICD-10-CM | POA: Diagnosis not present

## 2016-03-16 DIAGNOSIS — I5032 Chronic diastolic (congestive) heart failure: Secondary | ICD-10-CM | POA: Diagnosis not present

## 2016-03-16 DIAGNOSIS — I10 Essential (primary) hypertension: Secondary | ICD-10-CM | POA: Diagnosis not present

## 2016-03-16 DIAGNOSIS — L03116 Cellulitis of left lower limb: Secondary | ICD-10-CM | POA: Diagnosis not present

## 2016-03-19 ENCOUNTER — Ambulatory Visit (INDEPENDENT_AMBULATORY_CARE_PROVIDER_SITE_OTHER): Payer: Commercial Managed Care - HMO | Admitting: Cardiovascular Disease

## 2016-03-19 ENCOUNTER — Encounter: Payer: Self-pay | Admitting: Cardiovascular Disease

## 2016-03-19 VITALS — BP 120/60 | HR 60 | Ht 62.0 in | Wt 162.0 lb

## 2016-03-19 DIAGNOSIS — I495 Sick sinus syndrome: Secondary | ICD-10-CM | POA: Diagnosis not present

## 2016-03-19 DIAGNOSIS — I480 Paroxysmal atrial fibrillation: Secondary | ICD-10-CM | POA: Diagnosis not present

## 2016-03-19 DIAGNOSIS — I10 Essential (primary) hypertension: Secondary | ICD-10-CM

## 2016-03-19 DIAGNOSIS — R600 Localized edema: Secondary | ICD-10-CM

## 2016-03-19 DIAGNOSIS — I5032 Chronic diastolic (congestive) heart failure: Secondary | ICD-10-CM

## 2016-03-19 DIAGNOSIS — D649 Anemia, unspecified: Secondary | ICD-10-CM

## 2016-03-19 NOTE — Patient Instructions (Addendum)
Medication Instructions:   Try to add iron in your diet ACE wraps for leg swelling  Labwork:  No new labs needed  Testing/Procedures:  No further testing at this time   Follow-Up: It was a pleasure seeing you in the office today. Please call us if you have new issues that need to be addressed before your next appt.  [phone]  Your physician wants you to follow-up in: 2 months.  You will receive a reminder letter in the mail two months in advance. If you don't receive a letter, please call our office to schedule the follow-up appointment.  If you need a refill on your cardiac medications before your next appointment, please call your pharmacy.

## 2016-03-19 NOTE — Progress Notes (Signed)
Cardiology Office Note  Date:  03/19/2016   ID:  Kathryn Short, DOB 12-01-1921, MRN [id number]  PCP:  Tracie Harrier, MD   Chief Complaint  Patient presents with  . Other    3 month f/u edema ankles/fluid. Meds reviewed verbally with pt.    HPI:  Kathryn Short is an 82 year old woman with paroxysmal atrial fibrillation (last episode in March [id number]), diastolic CHF on a low-dose diuretic daily, labile hypertension, sick sinus syndrome, pacemaker placement followed by Dr. Caryl Short, who presented to the hospital [facility] on 02/16/2012 in atrial fibrillation. She presents today for routine followup for her blood pressure and weight gain him a leg edema. Her past surgical history includes open cholecystectomy, partial colon resection and colostomy, appendectomy, hysterectomy, oophorectomy, GYN malignancy treated with radiation in the 1950s Recent bowel obstruction December 2015 She presents for routine follow-up of her blood pressure, diastolic CHF and atrial fibrillation  In follow-up today, daughter presents with her, Reports worsening lower extremity edema left leg more than right leg Was treated by primary care with Keflex, no improvement in symptoms Leg is diffusely tender, swollen, no erythema  Review of lab work shows new Anemia, hematocrit to 31 Patient denies any bleeding Appetite sometimes poor No shortness of breath, no recent falls  She has been maintained on her torsemide at least daily, weight has been stable In fact daughter reports she has not been drinking very much Otherwise patient is in good spirits, some "dementia" per the daughter  EKG on today's visit shows paced rhythm, 60 bpm  Other past medical history  clinic 8/9, Noted to have increased atrial fibrillation burden on pacer download January 03 2016 recommended she increase her amiodarone, is on anticoagulation Low BP, head fullness Recommended that she restart her Aldactone as blood pressure improves  Seen by Dr.  Caryl Short August 15, recommended she increase her torsemide for several days Seen by primary care 8/15: urine UTI No Urine cx available to date BMP shows creatinine continues to run higher consistent with prerenal state Taking torsemide daily with 1.5 torsemide on weekends, Aldactone added every other day  Previous CT scan done by primary care showing small to moderate right pleural effusion, evidence of cirrhosis Mention of cirrhosis back on CT scan in 2015 Mildly elevated LFTs over the past year  in the hospital 05/25/2014, with small bowel obstruction. She was placed on bowel rest, IV fluids and she passed her bowels. She did not require surgery  Echocardiogram done in the hospital 05/25/2014 showing ejection fraction greater than 55%, moderate pulmonary hypertension, mild to moderate TR  Earlier in 2015, she had atrial tachycardia and pacer was tracking the atrial rhythm, Her device was reprogrammed in the office for DDI  She was continued on her beta blocker and amiodarone and rhythm broke, converting back to normal sinus rhythm. Pacemaker has been since changed back to her prior settings  She is reluctant to use a walker, still uses a cane. Anticoagulation discussed previouslywith the patient and her daughter. They are not interested. They preferred to stay on aspirin  history of falls with trauma requiring stitches. None recently Patient and daughter are afraid of her fall risk, other complications from anticoagulation. Currently not on anticoagulation at their request. They are aware of risks and benefits associated with arrhythmia and stroke.  Previous lab work; Total cholesterol 189, LDL 104, hematocrit 38, creatinine 0.8  previous visits to the emergency room with chest pain, dizziness, malaise. She had a problem on isosorbide in the past.  Feelings of insomnia, and anxious feeling, chest pain episodes at night that wake her from sleep, weakness, nausea, dizziness. She  has obstructive sleep apnea and does not use her BiPAP at nighttime  Echocardiogram in the hospital showed ejection fraction 50-55%, moderate LVH, mild to moderate TR, right ventricular systolic pressure 40 mm of mercury, mild to moderate aortic valve insufficiency   PMH:   has a past medical history of Atrial fibrillation ([place]); Bowel obstruction; CHF (congestive heart failure) ([place]); Fall; GERD (gastroesophageal reflux disease); Hypertension; Hypothyroidism; Melanoma ([place]); OSA (obstructive sleep apnea); and Uterine cancer ([place]).  PSH:    Past Surgical History:  Procedure Laterality Date  . CHOLECYSTECTOMY    . COLOSTOMY     For perforation  . Cytoscopy    . EP IMPLANTABLE DEVICE N/A 09/07/2015   Procedure:  PPM Generator Changeout;  Surgeon: Deboraha Sprang, MD;  Location: [facility];  Service: Cardiovascular;  Laterality: N/A;  . INSERT / REPLACE / REMOVE PACEMAKER     Implantation-[employer]  . SKIN BIOPSY     face  . TOTAL ABDOMINAL HYSTERECTOMY W/ BILATERAL SALPINGOOPHORECTOMY      Current Outpatient Prescriptions  Medication Sig Dispense Refill  . acetaminophen (TYLENOL) 500 MG tablet Take 1,000 mg by mouth daily as needed for mild pain or headache.     . albuterol (PROVENTIL HFA;VENTOLIN HFA) 108 (90 BASE) MCG/ACT inhaler Inhale 2 puffs into the lungs every 6 (six) hours as needed for wheezing or shortness of breath.     Marland Kitchen amiodarone (PACERONE) 200 MG tablet Take 1 tablet (200 mg total) by mouth daily. 90 tablet 3  . apixaban (ELIQUIS) 5 MG TABS tablet Take 1 tablet (5 mg total) by mouth 2 (two) times daily. 60 tablet 6  . b complex vitamins tablet Take 1 tablet by mouth daily.    . Carboxymethylcellul-Glycerin (LUBRICATING EYE DROPS OP) Place 2 drops into both eyes 2 (two) times daily.    . cefUROXime (CEFTIN) 250 MG tablet Take 250 mg by mouth daily as needed.     . clonazePAM (KLONOPIN) 0.5 MG tablet Take 0.5 mg by mouth 2 (two) times daily as needed for anxiety.     Marland Kitchen lactulose (CHRONULAC) 10 GM/15ML solution Take 30 g by mouth daily.     Marland Kitchen levothyroxine (SYNTHROID, LEVOTHROID) 50 MCG tablet Take 50 mcg by mouth daily.     . metoprolol (LOPRESSOR) 50 MG tablet Take 1 tablet (50 mg total) by mouth 2 (two) times daily. 180 tablet 3  . mometasone-formoterol (DULERA) 100-5 MCG/ACT AERO Inhale 2 puffs into the lungs 2 (two) times daily as needed.     . montelukast (SINGULAIR) 10 MG tablet Take 10 mg by mouth at bedtime.    . Multiple Vitamin (MULTIVITAMIN) tablet Take 1 tablet by mouth daily.    . Multiple Vitamins-Minerals (PRESERVISION AREDS 2 PO) Take by mouth 2 (two) times daily.    . nitroGLYCERIN (NITROSTAT) 0.4 MG SL tablet Place 1 tablet (0.4 mg total) under the tongue every 5 (five) minutes as needed for chest pain. 25 tablet 11  . potassium chloride (K-DUR,KLOR-CON) 10 MEQ tablet Take 1 tablet (10 mEq total) by mouth daily as needed. (Patient taking differently: Take 10 mEq by mouth daily. ) 90 tablet 3  . Probiotic Product (PROBIOTIC DAILY PO) Take 1 capsule by mouth daily.     . sertraline (ZOLOFT) 25 MG tablet Take 25 mg by mouth daily.    Marland Kitchen spironolactone (ALDACTONE) 25 MG tablet  Take 25 mg by mouth every other day.     . torsemide (DEMADEX) 20 MG tablet as directed. Take one tablet (20 mg) by mouth once daily M-F, take 1 & 1/2 tablets (30 mg) once daily on Sat/Sun     . Vitamin D, Ergocalciferol, (DRISDOL) 50000 units CAPS capsule Take 50,000 Units by mouth every 7 (seven) days.     No current facility-administered medications for this visit.      Allergies:   Ace inhibitors; Ambien [zolpidem tartrate]; Bactrim [sulfamethoxazole-trimethoprim]; Digoxin and related; Hctz [hydrochlorothiazide]; and Lunesta [eszopiclone]   Social History:  The patient  reports that she has never smoked. She has never used smokeless tobacco. She reports that she does not drink alcohol or use drugs.   Family History:   Family history is unknown by patient.     Review of Systems: Review of Systems  Constitutional: Positive for malaise/fatigue.  Respiratory: Negative.   Cardiovascular: Positive for leg swelling.  Gastrointestinal: Negative.   Musculoskeletal: Negative.        Gait instability  Neurological: Negative.   Psychiatric/Behavioral: Negative.   All other systems reviewed and are negative.    PHYSICAL EXAM: VS:  BP 120/60 (BP Location: Right Arm, Patient Position: Sitting, Cuff Size: Normal)   Pulse 60   Ht 5\' 2"  (1.575 m)   Wt 162 lb (73.5 kg)   BMI 29.63 kg/m  , BMI Body mass index is 29.63 kg/m. GEN: Well nourished, well developed, in no acute distress, presenting in a wheelchair   HEENT: normal  Neck: no JVD, carotid bruits, or masses Cardiac: RRR; no murmurs, rubs, or gallops, trace edema left lower extremity. Trace pitting Respiratory:  clear to auscultation bilaterally, normal work of breathing GI: soft, nontender, nondistended, + BS MS: no deformity or atrophy  Skin: warm and dry, no rash Neuro:  Strength and sensation are intact Psych: euthymic mood, full affect    Recent Labs: 01/16/2016: ALT 47; BUN 29; Creatinine, Ser 1.22; Hemoglobin 13.0; Platelets 220; Potassium 4.5; Sodium 136    Lipid Panel No results found for: CHOL, HDL, LDLCALC, TRIG    Wt Readings from Last 3 Encounters:  03/19/16 162 lb (73.5 kg)  02/03/16 163 lb (73.9 kg)  01/24/16 166 lb (75.3 kg)       ASSESSMENT AND PLAN:  Paroxysmal atrial fibrillation (HCC) - Plan: EKG 12-Lead Paced rhythm, tolerating anticoagulation with eliquis, no recent falls  Essential hypertension Blood pressure running high, recommended she use extra half doses of clonidine as needed  Localized edema Likely makes of venous insufficiency exacerbated by anemia Less likely CHF Worsening edema left lower extremity on today's visit, recommended she apply ACE wrap, leg elevation.  Acute renal failure, unspecified acute renal failure type  (HCC) Creatinine down to 1.3 Previously 1.5 when she was mildly dehydrated  Anemia Etiology unclear, likely contributing to lower extremity edema Recommended she discuss with primary care, may need to guaiac stools, check iron level. If iron low may need iron infusion    Total encounter time more than 25 minutes  Greater than 50% was spent in counseling and coordination of care with the patient   Disposition:   F/U  6 weeks   Orders Placed This Encounter  Procedures  . EKG 12-Lead     Signed, Esmond Plants, M.D., Ph.D. 03/19/2016  [place], [place]

## 2016-03-20 DIAGNOSIS — K219 Gastro-esophageal reflux disease without esophagitis: Secondary | ICD-10-CM | POA: Diagnosis not present

## 2016-03-20 DIAGNOSIS — R600 Localized edema: Secondary | ICD-10-CM | POA: Diagnosis not present

## 2016-03-20 DIAGNOSIS — R8290 Unspecified abnormal findings in urine: Secondary | ICD-10-CM | POA: Diagnosis not present

## 2016-03-20 DIAGNOSIS — I480 Paroxysmal atrial fibrillation: Secondary | ICD-10-CM | POA: Diagnosis not present

## 2016-03-20 DIAGNOSIS — Z0000 Encounter for general adult medical examination without abnormal findings: Secondary | ICD-10-CM | POA: Diagnosis not present

## 2016-03-20 DIAGNOSIS — F418 Other specified anxiety disorders: Secondary | ICD-10-CM | POA: Diagnosis not present

## 2016-03-20 DIAGNOSIS — E538 Deficiency of other specified B group vitamins: Secondary | ICD-10-CM | POA: Diagnosis not present

## 2016-03-20 DIAGNOSIS — I10 Essential (primary) hypertension: Secondary | ICD-10-CM | POA: Diagnosis not present

## 2016-03-20 DIAGNOSIS — I5032 Chronic diastolic (congestive) heart failure: Secondary | ICD-10-CM | POA: Diagnosis not present

## 2016-04-08 DIAGNOSIS — I10 Essential (primary) hypertension: Secondary | ICD-10-CM | POA: Diagnosis not present

## 2016-04-08 DIAGNOSIS — M629 Disorder of muscle, unspecified: Secondary | ICD-10-CM | POA: Diagnosis not present

## 2016-04-08 DIAGNOSIS — G4733 Obstructive sleep apnea (adult) (pediatric): Secondary | ICD-10-CM | POA: Diagnosis not present

## 2016-04-08 DIAGNOSIS — M6281 Muscle weakness (generalized): Secondary | ICD-10-CM | POA: Diagnosis not present

## 2016-04-08 DIAGNOSIS — I4891 Unspecified atrial fibrillation: Secondary | ICD-10-CM | POA: Diagnosis not present

## 2016-04-09 DIAGNOSIS — R8290 Unspecified abnormal findings in urine: Secondary | ICD-10-CM | POA: Diagnosis not present

## 2016-04-17 ENCOUNTER — Other Ambulatory Visit: Payer: Self-pay | Admitting: Internal Medicine

## 2016-04-17 ENCOUNTER — Ambulatory Visit
Admission: RE | Admit: 2016-04-17 | Discharge: 2016-04-17 | Disposition: A | Discharge status: Home / Self Care | Payer: Commercial Managed Care - HMO | Source: Ambulatory Visit | Attending: Internal Medicine | Admitting: Internal Medicine

## 2016-04-17 DIAGNOSIS — Z1231 Encounter for screening mammogram for malignant neoplasm of breast: Secondary | ICD-10-CM | POA: Diagnosis not present

## 2016-04-24 ENCOUNTER — Ambulatory Visit (INDEPENDENT_AMBULATORY_CARE_PROVIDER_SITE_OTHER): Payer: Commercial Managed Care - HMO | Admitting: *Deleted

## 2016-04-24 ENCOUNTER — Telehealth: Payer: Self-pay | Admitting: Cardiology

## 2016-04-24 DIAGNOSIS — I495 Sick sinus syndrome: Secondary | ICD-10-CM | POA: Diagnosis not present

## 2016-04-24 NOTE — Telephone Encounter (Signed)
Spoke with pt and reminded pt of remote transmission that is due today. Pt verbalized understanding.   

## 2016-04-25 NOTE — Progress Notes (Signed)
Remote pacemaker transmission.   

## 2016-05-01 DIAGNOSIS — I10 Essential (primary) hypertension: Secondary | ICD-10-CM | POA: Diagnosis not present

## 2016-05-01 DIAGNOSIS — I5032 Chronic diastolic (congestive) heart failure: Secondary | ICD-10-CM | POA: Diagnosis not present

## 2016-05-01 DIAGNOSIS — I480 Paroxysmal atrial fibrillation: Secondary | ICD-10-CM | POA: Diagnosis not present

## 2016-05-01 DIAGNOSIS — K219 Gastro-esophageal reflux disease without esophagitis: Secondary | ICD-10-CM | POA: Diagnosis not present

## 2016-05-01 DIAGNOSIS — F418 Other specified anxiety disorders: Secondary | ICD-10-CM | POA: Diagnosis not present

## 2016-05-01 DIAGNOSIS — E079 Disorder of thyroid, unspecified: Secondary | ICD-10-CM | POA: Diagnosis not present

## 2016-05-04 LAB — CUP PACEART REMOTE DEVICE CHECK
Battery Impedance: 103 Ohm
Battery Remaining Longevity: 134 mo
Battery Voltage: 2.79 V
Brady Statistic AP VP Percent: 0 %
Implantable Lead Implant Date: 20080813
Implantable Lead Location: 753859
Implantable Lead Model: 5076
Implantable Pulse Generator Implant Date: 20170329
Lead Channel Impedance Value: 390 Ohm
Lead Channel Pacing Threshold Amplitude: 1.125 V
Lead Channel Pacing Threshold Amplitude: 1.375 V
Lead Channel Setting Pacing Amplitude: 2.25 V
Lead Channel Setting Pacing Amplitude: 2.75 V
Lead Channel Setting Pacing Pulse Width: 0.4 ms
MDC IDC LEAD IMPLANT DT: 20080813
MDC IDC LEAD LOCATION: 753860
MDC IDC MSMT LEADCHNL RA PACING THRESHOLD PULSEWIDTH: 0.4 ms
MDC IDC MSMT LEADCHNL RV IMPEDANCE VALUE: 456 Ohm
MDC IDC MSMT LEADCHNL RV PACING THRESHOLD PULSEWIDTH: 0.4 ms
MDC IDC MSMT LEADCHNL RV SENSING INTR AMPL: 5.6 mV
MDC IDC SESS DTM: 20171115182221
MDC IDC SET LEADCHNL RV SENSING SENSITIVITY: 2.8 mV
MDC IDC STAT BRADY AP VS PERCENT: 100 %
MDC IDC STAT BRADY AS VP PERCENT: 0 %
MDC IDC STAT BRADY AS VS PERCENT: 0 %

## 2016-05-09 DIAGNOSIS — I10 Essential (primary) hypertension: Secondary | ICD-10-CM | POA: Diagnosis not present

## 2016-05-09 DIAGNOSIS — M6281 Muscle weakness (generalized): Secondary | ICD-10-CM | POA: Diagnosis not present

## 2016-05-09 DIAGNOSIS — M629 Disorder of muscle, unspecified: Secondary | ICD-10-CM | POA: Diagnosis not present

## 2016-05-09 DIAGNOSIS — G4733 Obstructive sleep apnea (adult) (pediatric): Secondary | ICD-10-CM | POA: Diagnosis not present

## 2016-05-09 DIAGNOSIS — I4891 Unspecified atrial fibrillation: Secondary | ICD-10-CM | POA: Diagnosis not present

## 2016-05-10 ENCOUNTER — Encounter: Payer: Self-pay | Admitting: Cardiovascular Disease

## 2016-05-10 ENCOUNTER — Ambulatory Visit (INDEPENDENT_AMBULATORY_CARE_PROVIDER_SITE_OTHER): Payer: Commercial Managed Care - HMO | Admitting: Cardiovascular Disease

## 2016-05-10 VITALS — BP 119/70 | HR 61 | Ht 62.0 in | Wt 158.0 lb

## 2016-05-10 DIAGNOSIS — I5032 Chronic diastolic (congestive) heart failure: Secondary | ICD-10-CM | POA: Diagnosis not present

## 2016-05-10 DIAGNOSIS — I480 Paroxysmal atrial fibrillation: Secondary | ICD-10-CM | POA: Diagnosis not present

## 2016-05-10 DIAGNOSIS — I10 Essential (primary) hypertension: Secondary | ICD-10-CM

## 2016-05-10 DIAGNOSIS — R600 Localized edema: Secondary | ICD-10-CM

## 2016-05-10 DIAGNOSIS — I495 Sick sinus syndrome: Secondary | ICD-10-CM

## 2016-05-10 NOTE — Patient Instructions (Addendum)
Medication Instructions:   No medication changes made  Labwork:  No new labs needed  Testing/Procedures:  No further testing at this time   I recommend watching educational videos on topics of interest to you at:       [email or web address]  Enter code: [email or web address]    Follow-Up: It was a pleasure seeing you in the office today. Please call us if you have new issues that need to be addressed before your next appt.  [phone]  Your physician wants you to follow-up in: 6 weeks    If you need a refill on your cardiac medications before your next appointment, please call your pharmacy.     

## 2016-05-10 NOTE — Progress Notes (Signed)
Cardiology Office Note  Date:  05/10/2016   ID:  Kathryn Short, DOB [birth year], MRN [id number]  PCP:  Tracie Harrier, MD   Chief Complaint  Patient presents with  . other    6 wk f/u c/o sob. Meds reviewed verbally with pt.    HPI:  Kathryn Short is an 82 year old woman with paroxysmal atrial fibrillation (last episode in March 123456), diastolic CHF on a  diuretic daily,Prior history of labile hypertension, sick sinus syndrome, pacemaker placement followed by Dr. Caryl Comes, who presented to the hospital [facility] on 02/16/2012 in atrial fibrillation. She presents today for routine followup for her blood pressure and weight gain him a leg edema. Her past surgical history includes open cholecystectomy, partial colon resection and colostomy, appendectomy, hysterectomy, oophorectomy, GYN malignancy treated with radiation in the 1950s Recent bowel obstruction December 2015 She presents for routine follow-up of her blood pressure, diastolic CHF and atrial fibrillation  In follow-up she reports that she is doing well, daughter presents with her Leg edema has resolved Taking torsemide with potassium daily Blood pressure sometimes elevated in the evening, takes clonidine for systolic pressures greater than 150 Blood pressures typically lower in the morning Ambulating, no recent falls, unsteady gait  Lab work reviewed 05/01/2016: CR 1.2,  UTI, took ABX Enterococcus, sen to nitrofirantoin total chol 207, LDL 112, HCT 35,  Appetite sometimes poor No shortness of breath, no recent falls   some "dementia" per the daughter  EKG on today's visit shows paced rhythm, 60 bpm  Other past medical history  clinic 8/9, Noted to have increased atrial fibrillation burden on pacer download January 03 2016 recommended she increase her amiodarone, is on anticoagulation Low BP, head fullness Recommended that she restart her Aldactone as blood pressure improves  Seen by Dr. Caryl Comes August 15, recommended she  increase her torsemide for several days Seen by primary care 8/15: urine UTI No Urine cx available to date BMP shows creatinine continues to run higher consistent with prerenal state Taking torsemide daily with 1.5 torsemide on weekends, Aldactone added every other day  Previous CT scan done by primary care showing small to moderate right pleural effusion, evidence of cirrhosis Mention of cirrhosis back on CT scan in 2015 Mildly elevated LFTs over the past year  in the hospital 05/25/2014, with small bowel obstruction. She was placed on bowel rest, IV fluids and she passed her bowels. She did not require surgery  Echocardiogram done in the hospital 05/25/2014 showing ejection fraction greater than 55%, moderate pulmonary hypertension, mild to moderate TR  Earlier in 2015, she had atrial tachycardia and pacer was tracking the atrial rhythm, Her device was reprogrammed in the office for DDI  She was continued on her beta blocker and amiodarone and rhythm broke, converting back to normal sinus rhythm. Pacemaker has been since changed back to her prior settings  She is reluctant to use a walker, still uses a cane. Anticoagulation discussed previouslywith the patient and her daughter. They are not interested. They preferred to stay on aspirin  history of falls with trauma requiring stitches. None recently Patient and daughter are afraid of her fall risk, other complications from anticoagulation. Currently not on anticoagulation at their request. They are aware of risks and benefits associated with arrhythmia and stroke.  Previous lab work; Total cholesterol 189, LDL 104, hematocrit 38, creatinine 0.8  previous visits to the emergency room with chest pain, dizziness, malaise. She had a problem on isosorbide in the past. Feelings of insomnia, and anxious feeling,  chest pain episodes at night that wake her from sleep, weakness, nausea, dizziness. She has obstructive sleep apnea and  does not use her BiPAP at nighttime  Echocardiogram in the hospital showed ejection fraction 50-55%, moderate LVH, mild to moderate TR, right ventricular systolic pressure 40 mm of mercury, mild to moderate aortic valve insufficiency   PMH:   has a past medical history of Atrial fibrillation ([place]); Bowel obstruction; CHF (congestive heart failure) ([place]); Fall; GERD (gastroesophageal reflux disease); Hypertension; Hypothyroidism; Melanoma ([place]); OSA (obstructive sleep apnea); and Uterine cancer ([place]).  PSH:    Past Surgical History:  Procedure Laterality Date  . CHOLECYSTECTOMY    . COLOSTOMY     For perforation  . Cytoscopy    . EP IMPLANTABLE DEVICE N/A 09/07/2015   Procedure:  PPM Generator Changeout;  Surgeon: Deboraha Sprang, MD;  Location: [place] [place] [facility];  Service: Cardiovascular;  Laterality: N/A;  . INSERT / REPLACE / REMOVE PACEMAKER     Implantation-[employer]  . SKIN BIOPSY     face  . TOTAL ABDOMINAL HYSTERECTOMY W/ BILATERAL SALPINGOOPHORECTOMY      Current Outpatient Prescriptions  Medication Sig Dispense Refill  . acetaminophen (TYLENOL) 500 MG tablet Take 1,000 mg by mouth daily as needed for mild pain or headache.     . albuterol (PROVENTIL HFA;VENTOLIN HFA) 108 (90 BASE) MCG/ACT inhaler Inhale 2 puffs into the lungs every 6 (six) hours as needed for wheezing or shortness of breath.     Marland Kitchen amiodarone (PACERONE) 200 MG tablet Take 1 tablet (200 mg total) by mouth daily. 90 tablet 3  . apixaban (ELIQUIS) 5 MG TABS tablet Take 1 tablet (5 mg total) by mouth 2 (two) times daily. 60 tablet 6  . b complex vitamins tablet Take 1 tablet by mouth daily.    . Carboxymethylcellul-Glycerin (LUBRICATING EYE DROPS OP) Place 2 drops into both eyes 2 (two) times daily.    . cefUROXime (CEFTIN) 250 MG tablet Take 250 mg by mouth daily as needed.     . clonazePAM (KLONOPIN) 0.5 MG tablet Take 0.5 mg by mouth 2 (two) times daily as needed for anxiety.    Marland Kitchen lactulose (CHRONULAC) 10  GM/15ML solution Take 30 g by mouth daily.     Marland Kitchen levothyroxine (SYNTHROID, LEVOTHROID) 50 MCG tablet Take 50 mcg by mouth daily.     . metoprolol (LOPRESSOR) 50 MG tablet Take 1 tablet (50 mg total) by mouth 2 (two) times daily. 180 tablet 3  . mometasone-formoterol (DULERA) 100-5 MCG/ACT AERO Inhale 2 puffs into the lungs 2 (two) times daily as needed.     . montelukast (SINGULAIR) 10 MG tablet Take 10 mg by mouth at bedtime.    . Multiple Vitamin (MULTIVITAMIN) tablet Take 1 tablet by mouth daily.    . Multiple Vitamins-Minerals (PRESERVISION AREDS 2 PO) Take by mouth 2 (two) times daily.    . nitrofurantoin (MACRODANTIN) 100 MG capsule Take 100 mg by mouth 2 (two) times daily.    . nitroGLYCERIN (NITROSTAT) 0.4 MG SL tablet Place 1 tablet (0.4 mg total) under the tongue every 5 (five) minutes as needed for chest pain. 25 tablet 11  . potassium chloride (K-DUR,KLOR-CON) 10 MEQ tablet Take 1 tablet (10 mEq total) by mouth daily as needed. (Patient taking differently: Take 10 mEq by mouth daily. ) 90 tablet 3  . Probiotic Product (PROBIOTIC DAILY PO) Take 1 capsule by mouth daily.     . sertraline (ZOLOFT) 50 MG tablet Take 50 mg  by mouth daily.    Marland Kitchen spironolactone (ALDACTONE) 25 MG tablet Take 25 mg by mouth every other day.     . torsemide (DEMADEX) 20 MG tablet as directed. Take one tablet (20 mg) by mouth once daily M-F, take 1 & 1/2 tablets (30 mg) once daily on Sat/Sun     . Vitamin D, Ergocalciferol, (DRISDOL) 50000 units CAPS capsule Take 50,000 Units by mouth every 7 (seven) days.     No current facility-administered medications for this visit.      Allergies:   Ace inhibitors; Ambien [zolpidem tartrate]; Bactrim [sulfamethoxazole-trimethoprim]; Digoxin and related; Hctz [hydrochlorothiazide]; and Lunesta [eszopiclone]   Social History:  The patient  reports that she has never smoked. She has never used smokeless tobacco. She reports that she does not drink alcohol or use drugs.    Family History:   Family history is unknown by patient.    Review of Systems: Review of Systems  Constitutional: Negative.   Respiratory: Negative.   Cardiovascular: Negative.   Gastrointestinal: Negative.   Musculoskeletal: Negative.        Unsteady gait  Neurological: Negative.   Psychiatric/Behavioral: Negative.   All other systems reviewed and are negative.    PHYSICAL EXAM: VS:  BP 119/70 (BP Location: Left Arm, Patient Position: Sitting, Cuff Size: Large)   Pulse 61   Ht 5\' 2"  (1.575 m)   Wt 158 lb (71.7 kg)   BMI 28.90 kg/m  , BMI Body mass index is 28.9 kg/m. GEN: Well nourished, well developed, in no acute distress  HEENT: normal  Neck: no JVD, carotid bruits, or masses Cardiac: RRR; no murmurs, rubs, or gallops,no edema  Respiratory:  clear to auscultation bilaterally, normal work of breathing GI: soft, nontender, nondistended, + BS MS: no deformity or atrophy  Skin: warm and dry, no rash Neuro:  Strength and sensation are intact Psych: euthymic mood, full affect    Recent Labs: 01/16/2016: ALT 47; BUN 29; Creatinine, Ser 1.22; Hemoglobin 13.0; Platelets 220; Potassium 4.5; Sodium 136    Lipid Panel No results found for: CHOL, HDL, LDLCALC, TRIG    Wt Readings from Last 3 Encounters:  05/10/16 158 lb (71.7 kg)  03/19/16 162 lb (73.5 kg)  02/03/16 163 lb (73.9 kg)       ASSESSMENT AND PLAN:  Paroxysmal atrial fibrillation ([place]) - Plan: EKG 12-Lead On anticoagulation, no recent falls  Chronic diastolic CHF (congestive heart failure) ([place]) - Plan: EKG 12-Lead Appears relatively euvolemic on today's visit, weight down 4 to 5 pounds  Lab work stable  Chronotropic incompetence with sinus node dysfunction ([place]) Pacemaker, followed by Dr. Caryl Comes   Essential hypertension Blood pressure relatively stable, sometimes needing extra clonidine in the evening for hypertension  Localized edema Lower extremity edema has resolved  recent lab work showing  stable renal function, creatinine 1.2 We'll continue current medication regiment   Total encounter time more than 15 minutes  Greater than 50% was spent in counseling and coordination of care with the patient   Disposition:   F/U  2 months   Orders Placed This Encounter  Procedures  . EKG 12-Lead     Signed, Esmond Plants, M.D., Ph.D. 05/10/2016  [place], [place]

## 2016-05-16 ENCOUNTER — Other Ambulatory Visit: Payer: Self-pay | Admitting: Cardiovascular Disease

## 2016-06-08 DIAGNOSIS — G4733 Obstructive sleep apnea (adult) (pediatric): Secondary | ICD-10-CM | POA: Diagnosis not present

## 2016-06-08 DIAGNOSIS — I4891 Unspecified atrial fibrillation: Secondary | ICD-10-CM | POA: Diagnosis not present

## 2016-06-08 DIAGNOSIS — M629 Disorder of muscle, unspecified: Secondary | ICD-10-CM | POA: Diagnosis not present

## 2016-06-08 DIAGNOSIS — I10 Essential (primary) hypertension: Secondary | ICD-10-CM | POA: Diagnosis not present

## 2016-06-08 DIAGNOSIS — M6281 Muscle weakness (generalized): Secondary | ICD-10-CM | POA: Diagnosis not present

## 2016-06-19 DIAGNOSIS — I480 Paroxysmal atrial fibrillation: Secondary | ICD-10-CM | POA: Diagnosis not present

## 2016-06-19 DIAGNOSIS — E079 Disorder of thyroid, unspecified: Secondary | ICD-10-CM | POA: Diagnosis not present

## 2016-06-19 DIAGNOSIS — F418 Other specified anxiety disorders: Secondary | ICD-10-CM | POA: Diagnosis not present

## 2016-06-19 DIAGNOSIS — I10 Essential (primary) hypertension: Secondary | ICD-10-CM | POA: Diagnosis not present

## 2016-06-19 DIAGNOSIS — K7460 Unspecified cirrhosis of liver: Secondary | ICD-10-CM | POA: Diagnosis not present

## 2016-06-19 DIAGNOSIS — I5032 Chronic diastolic (congestive) heart failure: Secondary | ICD-10-CM | POA: Diagnosis not present

## 2016-06-19 DIAGNOSIS — R8290 Unspecified abnormal findings in urine: Secondary | ICD-10-CM | POA: Diagnosis not present

## 2016-06-19 DIAGNOSIS — R112 Nausea with vomiting, unspecified: Secondary | ICD-10-CM | POA: Diagnosis not present

## 2016-06-21 ENCOUNTER — Ambulatory Visit: Payer: Commercial Managed Care - HMO | Admitting: Cardiovascular Disease

## 2016-06-21 ENCOUNTER — Other Ambulatory Visit: Payer: Self-pay | Admitting: Internal Medicine

## 2016-06-21 DIAGNOSIS — R7989 Other specified abnormal findings of blood chemistry: Secondary | ICD-10-CM

## 2016-06-21 DIAGNOSIS — R945 Abnormal results of liver function studies: Secondary | ICD-10-CM

## 2016-06-26 ENCOUNTER — Other Ambulatory Visit: Payer: Self-pay | Admitting: Internal Medicine

## 2016-06-26 DIAGNOSIS — R1084 Generalized abdominal pain: Secondary | ICD-10-CM

## 2016-06-29 ENCOUNTER — Ambulatory Visit: Payer: Commercial Managed Care - HMO | Admitting: Cardiovascular Disease

## 2016-07-03 ENCOUNTER — Ambulatory Visit
Admission: RE | Admit: 2016-07-03 | Discharge: 2016-07-03 | Disposition: A | Discharge status: Home / Self Care | Payer: Medicare HMO | Source: Ambulatory Visit | Attending: Internal Medicine | Admitting: Internal Medicine

## 2016-07-03 DIAGNOSIS — I700 Atherosclerosis of aorta: Secondary | ICD-10-CM | POA: Diagnosis not present

## 2016-07-03 DIAGNOSIS — J90 Pleural effusion, not elsewhere classified: Secondary | ICD-10-CM | POA: Diagnosis not present

## 2016-07-03 DIAGNOSIS — M8580 Other specified disorders of bone density and structure, unspecified site: Secondary | ICD-10-CM | POA: Diagnosis not present

## 2016-07-03 DIAGNOSIS — M799 Soft tissue disorder, unspecified: Secondary | ICD-10-CM | POA: Insufficient documentation

## 2016-07-03 DIAGNOSIS — R1084 Generalized abdominal pain: Secondary | ICD-10-CM

## 2016-07-03 DIAGNOSIS — K7460 Unspecified cirrhosis of liver: Secondary | ICD-10-CM | POA: Diagnosis not present

## 2016-07-03 DIAGNOSIS — R109 Unspecified abdominal pain: Secondary | ICD-10-CM | POA: Diagnosis not present

## 2016-07-03 DIAGNOSIS — R1110 Vomiting, unspecified: Secondary | ICD-10-CM | POA: Diagnosis not present

## 2016-07-03 DIAGNOSIS — R112 Nausea with vomiting, unspecified: Secondary | ICD-10-CM | POA: Diagnosis not present

## 2016-07-03 LAB — POCT I-STAT CREATININE: CREATININE: 1.4 mg/dL — AB (ref 0.44–1.00)

## 2016-07-03 MED ORDER — IOPAMIDOL (ISOVUE-300) INJECTION 61%
80.0000 mL | Freq: Once | INTRAVENOUS | Status: AC | PRN
  Administered 2016-07-03: 80 mL via INTRAVENOUS

## 2016-07-09 DIAGNOSIS — I4891 Unspecified atrial fibrillation: Secondary | ICD-10-CM | POA: Diagnosis not present

## 2016-07-09 DIAGNOSIS — M629 Disorder of muscle, unspecified: Secondary | ICD-10-CM | POA: Diagnosis not present

## 2016-07-09 DIAGNOSIS — G4733 Obstructive sleep apnea (adult) (pediatric): Secondary | ICD-10-CM | POA: Diagnosis not present

## 2016-07-09 DIAGNOSIS — I10 Essential (primary) hypertension: Secondary | ICD-10-CM | POA: Diagnosis not present

## 2016-07-09 DIAGNOSIS — M6281 Muscle weakness (generalized): Secondary | ICD-10-CM | POA: Diagnosis not present

## 2016-07-16 ENCOUNTER — Ambulatory Visit: Payer: Commercial Managed Care - HMO | Admitting: Cardiovascular Disease

## 2016-07-23 DIAGNOSIS — H353132 Nonexudative age-related macular degeneration, bilateral, intermediate dry stage: Secondary | ICD-10-CM | POA: Diagnosis not present

## 2016-07-25 ENCOUNTER — Ambulatory Visit (INDEPENDENT_AMBULATORY_CARE_PROVIDER_SITE_OTHER): Payer: Medicare HMO | Admitting: *Deleted

## 2016-07-25 ENCOUNTER — Telehealth: Payer: Self-pay | Admitting: Cardiology

## 2016-07-25 DIAGNOSIS — I495 Sick sinus syndrome: Secondary | ICD-10-CM

## 2016-07-25 NOTE — Telephone Encounter (Signed)
Confirmed remote transmission w/ pt daughter.   

## 2016-07-26 ENCOUNTER — Encounter: Payer: Self-pay | Admitting: Cardiology

## 2016-07-26 DIAGNOSIS — Z8542 Personal history of malignant neoplasm of other parts of uterus: Secondary | ICD-10-CM | POA: Diagnosis not present

## 2016-07-26 DIAGNOSIS — Z1211 Encounter for screening for malignant neoplasm of colon: Secondary | ICD-10-CM | POA: Diagnosis not present

## 2016-07-26 DIAGNOSIS — R1900 Intra-abdominal and pelvic swelling, mass and lump, unspecified site: Secondary | ICD-10-CM | POA: Diagnosis not present

## 2016-07-26 NOTE — Progress Notes (Signed)
Remote pacemaker transmission.   

## 2016-07-27 LAB — CUP PACEART REMOTE DEVICE CHECK
Battery Impedance: 103 Ohm
Brady Statistic AP VS Percent: 100 %
Brady Statistic AS VS Percent: 0 %
Date Time Interrogation Session: 20180214191050
Implantable Lead Implant Date: 20080813
Implantable Lead Location: 753860
Implantable Lead Model: 5076
Lead Channel Impedance Value: 429 Ohm
Lead Channel Pacing Threshold Pulse Width: 0.4 ms
Lead Channel Pacing Threshold Pulse Width: 0.4 ms
MDC IDC LEAD IMPLANT DT: 20080813
MDC IDC LEAD LOCATION: 753859
MDC IDC MSMT BATTERY REMAINING LONGEVITY: 136 mo
MDC IDC MSMT BATTERY VOLTAGE: 2.79 V
MDC IDC MSMT LEADCHNL RA IMPEDANCE VALUE: 366 Ohm
MDC IDC MSMT LEADCHNL RA PACING THRESHOLD AMPLITUDE: 1 V
MDC IDC MSMT LEADCHNL RV PACING THRESHOLD AMPLITUDE: 1.5 V
MDC IDC PG IMPLANT DT: 20170329
MDC IDC SET LEADCHNL RA PACING AMPLITUDE: 2 V
MDC IDC SET LEADCHNL RV PACING AMPLITUDE: 3 V
MDC IDC SET LEADCHNL RV PACING PULSEWIDTH: 0.4 ms
MDC IDC SET LEADCHNL RV SENSING SENSITIVITY: 2.8 mV
MDC IDC STAT BRADY AP VP PERCENT: 0 %
MDC IDC STAT BRADY AS VP PERCENT: 0 %

## 2016-07-30 ENCOUNTER — Encounter: Payer: Self-pay | Admitting: Cardiovascular Disease

## 2016-07-30 ENCOUNTER — Ambulatory Visit (INDEPENDENT_AMBULATORY_CARE_PROVIDER_SITE_OTHER): Payer: Medicare HMO | Admitting: Cardiovascular Disease

## 2016-07-30 VITALS — BP 110/60 | HR 71 | Ht 62.0 in | Wt 150.0 lb

## 2016-07-30 DIAGNOSIS — I495 Sick sinus syndrome: Secondary | ICD-10-CM

## 2016-07-30 DIAGNOSIS — I5032 Chronic diastolic (congestive) heart failure: Secondary | ICD-10-CM | POA: Diagnosis not present

## 2016-07-30 DIAGNOSIS — I10 Essential (primary) hypertension: Secondary | ICD-10-CM | POA: Diagnosis not present

## 2016-07-30 DIAGNOSIS — I480 Paroxysmal atrial fibrillation: Secondary | ICD-10-CM

## 2016-07-30 MED ORDER — NITROGLYCERIN 0.4 MG SL SUBL: 0.4000 mg | SUBLINGUAL_TABLET | SUBLINGUAL | 6 refills | Status: AC | PRN

## 2016-07-30 MED ORDER — CLONIDINE HCL 0.1 MG PO TABS: 0.1000 mg | ORAL_TABLET | ORAL | 6 refills | Status: AC | PRN

## 2016-07-30 MED ORDER — LOSARTAN POTASSIUM 100 MG PO TABS: 100.0000 mg | ORAL_TABLET | Freq: Every day | ORAL | 6 refills | Status: AC | PRN

## 2016-07-30 MED ORDER — METOPROLOL TARTRATE 50 MG PO TABS: 50.0000 mg | ORAL_TABLET | Freq: Two times a day (BID) | ORAL | 3 refills | Status: AC

## 2016-07-30 NOTE — Patient Instructions (Signed)
Medication Instructions:   No medication changes made  Labwork:  No new labs needed  Testing/Procedures:  No further testing at this time   I recommend watching educational videos on topics of interest to you at:       [email or web address]  Enter code: [email or web address]    Follow-Up: It was a pleasure seeing you in the office today. Please call us if you have new issues that need to be addressed before your next appt.  [phone]  Your physician wants you to follow-up in: 6 weeks    If you need a refill on your cardiac medications before your next appointment, please call your pharmacy.     

## 2016-07-30 NOTE — Progress Notes (Signed)
Cardiology Office Note  Date:  07/30/2016   ID:  Kathryn Short, DOB 1921-10-03, MRN [id number]  PCP:  Tracie Harrier, MD   Chief Complaint  Patient presents with  . other    6 week follow up. Meds reviewed by the pt. verbally.     HPI:  Kathryn Short is an 83 year old woman with paroxysmal atrial fibrillation (last episode in March 123456), diastolic CHF on a  diuretic daily,Prior history of labile hypertension, sick sinus syndrome, pacemaker placement followed by Dr. Caryl Comes, who presented to the hospital [facility] on 02/16/2012 in atrial fibrillation.  Her past surgical history includes open cholecystectomy, partial colon resection and colostomy, appendectomy, hysterectomy, oophorectomy, GYN malignancy treated with radiation in the 1950s Recent bowel obstruction December 2015 She presents for routine follow-up of her blood pressure, diastolic CHF and atrial fibrillation  She presents with her daughter on today's visit 3 weeks ago had symptoms of Vomiting, abdominal cramps Etiology unclear Went to the hospital and had CT ABD:  aortic atherosclerotic Questionable mass but was seen by OB/GYN and was cleared  Daughter reports that she is Not eating well, Down 12 pounds over the past 6 months  Takes clonidine and losartan as needed, not regular  Continues to take torsemide daily, Aldactone every other day Leg edema has resolved Ambulating, no recent falls, unsteady gait Blood pressure well controlled at home  Lab work  05/01/2016: CR 1.2,  UTI, took ABX Enterococcus, sen to nitrofirantoin total chol 207, LDL 112,  Lab work January 2018 creatinine 1.3  EKG on today's visit shows paced rhythm, atrial paced, 71 bpm rare PVC  Other past medical history clinic 8/9, Noted to have increased atrial fibrillation burden on pacer download January 03 2016 recommended she increase her amiodarone, is on anticoagulation Low BP, head fullness Recommended that she restart her Aldactone as blood  pressure improves  Seen by Dr. Caryl Comes August 15, recommended she increase her torsemide for several days Seen by primary care 8/15: urine UTI No Urine cx available to date BMP shows creatinine continues to run higher consistent with prerenal state Taking torsemide daily with 1.5 torsemide on weekends, Aldactone added every other day  Previous CT scan done by primary care showing small to moderate right pleural effusion, evidence of cirrhosis Mention of cirrhosis back on CT scan in 2015 Mildly elevated LFTs over the past year  in the hospital 05/25/2014, with small bowel obstruction. She was placed on bowel rest, IV fluids and she passed her bowels. She did not require surgery  Echocardiogram done in the hospital 05/25/2014 showing ejection fraction greater than 55%, moderate pulmonary hypertension, mild to moderate TR  Earlier in 2015, she had atrial tachycardia and pacer was tracking the atrial rhythm, Her device was reprogrammed in the office for DDI  She was continued on her beta blocker and amiodarone and rhythm broke, converting back to normal sinus rhythm. Pacemaker has been since changed back to her prior settings  She is reluctant to use a walker, still uses a cane. Anticoagulation discussed previouslywith the patient and her daughter. They are not interested. They preferred to stay on aspirin  history of falls with trauma requiring stitches. None recently Patient and daughter are afraid of her fall risk, other complications from anticoagulation. Currently not on anticoagulation at their request. They are aware of risks and benefits associated with arrhythmia and stroke.  Previous lab work; Total cholesterol 189, LDL 104, hematocrit 38, creatinine 0.8  previous visits to the emergency room with chest pain,  dizziness, malaise. She had a problem on isosorbide in the past. Feelings of insomnia, and anxious feeling, chest pain episodes at night that wake her from sleep,  weakness, nausea, dizziness. She has obstructive sleep apnea and does not use her BiPAP at nighttime  Echocardiogram in the hospital showed ejection fraction 50-55%, moderate LVH, mild to moderate TR, right ventricular systolic pressure 40 mm of mercury, mild to moderate aortic valve insufficiency  PMH:   has a past medical history of Atrial fibrillation ([place]); Bowel obstruction; CHF (congestive heart failure) ([place]); Fall; GERD (gastroesophageal reflux disease); Hypertension; Hypothyroidism; Melanoma ([place]); OSA (obstructive sleep apnea); and Uterine cancer ([place]).  PSH:    Past Surgical History:  Procedure Laterality Date  . CHOLECYSTECTOMY    . COLOSTOMY     For perforation  . Cytoscopy    . EP IMPLANTABLE DEVICE N/A 09/07/2015   Procedure:  PPM Generator Changeout;  Surgeon: Deboraha Sprang, MD;  Location: [place] [facility];  Service: Cardiovascular;  Laterality: N/A;  . INSERT / REPLACE / REMOVE PACEMAKER     Implantation-[employer]  . SKIN BIOPSY     face  . TOTAL ABDOMINAL HYSTERECTOMY W/ BILATERAL SALPINGOOPHORECTOMY      Current Outpatient Prescriptions  Medication Sig Dispense Refill  . acetaminophen (TYLENOL) 500 MG tablet Take 1,000 mg by mouth daily as needed for mild pain or headache.     . albuterol (PROVENTIL HFA;VENTOLIN HFA) 108 (90 BASE) MCG/ACT inhaler Inhale 2 puffs into the lungs every 6 (six) hours as needed for wheezing or shortness of breath.     Marland Kitchen amiodarone (PACERONE) 200 MG tablet Take 1 tablet (200 mg total) by mouth daily. 90 tablet 3  . b complex vitamins tablet Take 1 tablet by mouth daily.    . Carboxymethylcellul-Glycerin (LUBRICATING EYE DROPS OP) Place 2 drops into both eyes 2 (two) times daily.    . cefUROXime (CEFTIN) 250 MG tablet Take 250 mg by mouth daily as needed.     . clonazePAM (KLONOPIN) 0.5 MG tablet Take 0.5 mg by mouth 2 (two) times daily as needed for anxiety.    . cloNIDine (CATAPRES) 0.1 MG tablet Take 0.1 mg by mouth as needed.    Marland Kitchen  ELIQUIS 5 MG TABS tablet TAKE ONE (1) TABLET BY MOUTH TWO (2) TIMES DAILY 60 tablet 3  . lactulose (CHRONULAC) 10 GM/15ML solution Take 30 g by mouth daily.     Marland Kitchen levothyroxine (SYNTHROID, LEVOTHROID) 50 MCG tablet Take 50 mcg by mouth daily.     Marland Kitchen losartan (COZAAR) 100 MG tablet Take 100 mg by mouth daily as needed.    . metoprolol (LOPRESSOR) 50 MG tablet TAKE ONE TABLET TWICE DAILY 180 tablet 1  . mometasone-formoterol (DULERA) 100-5 MCG/ACT AERO Inhale 2 puffs into the lungs 2 (two) times daily as needed.     . montelukast (SINGULAIR) 10 MG tablet Take 10 mg by mouth at bedtime.    . Multiple Vitamin (MULTIVITAMIN) tablet Take 1 tablet by mouth daily.    . Multiple Vitamins-Minerals (PRESERVISION AREDS 2 PO) Take by mouth 2 (two) times daily.    . nitrofurantoin (MACRODANTIN) 100 MG capsule Take 100 mg by mouth 2 (two) times daily.    . nitroGLYCERIN (NITROSTAT) 0.4 MG SL tablet Place 1 tablet (0.4 mg total) under the tongue every 5 (five) minutes as needed for chest pain. 25 tablet 11  . potassium chloride (K-DUR,KLOR-CON) 10 MEQ tablet Take 1 tablet (10 mEq total) by mouth daily as needed. (  Patient taking differently: Take 10 mEq by mouth daily. ) 90 tablet 3  . Probiotic Product (PROBIOTIC DAILY PO) Take 1 capsule by mouth daily.     . sertraline (ZOLOFT) 50 MG tablet Take 50 mg by mouth daily.    Marland Kitchen spironolactone (ALDACTONE) 25 MG tablet Take 25 mg by mouth every other day.     . torsemide (DEMADEX) 20 MG tablet as directed. Take one tablet (20 mg) by mouth once daily M-F, take 1 & 1/2 tablets (30 mg) once daily on Sat/Sun     . Vitamin D, Ergocalciferol, (DRISDOL) 50000 units CAPS capsule Take 50,000 Units by mouth every 7 (seven) days.     No current facility-administered medications for this visit.      Allergies:   Ace inhibitors; Ambien [zolpidem tartrate]; Bactrim [sulfamethoxazole-trimethoprim]; Digoxin and related; Hctz [hydrochlorothiazide]; and Lunesta [eszopiclone]   Social  History:  The patient  reports that she has never smoked. She has never used smokeless tobacco. She reports that she does not drink alcohol or use drugs.   Family History:   Family history is unknown by patient.    Review of Systems: Review of Systems  Constitutional: Negative.   Respiratory: Negative.   Cardiovascular: Negative.   Gastrointestinal: Negative.   Musculoskeletal: Negative.   Neurological: Negative.   Psychiatric/Behavioral: Negative.   All other systems reviewed and are negative.    PHYSICAL EXAM: VS:  BP 110/60 (BP Location: Left Arm, Patient Position: Sitting, Cuff Size: Normal)   Pulse 71   Ht 5\' 2"  (1.575 m)   Wt 150 lb (68 kg)   BMI 27.44 kg/m  , BMI Body mass index is 27.44 kg/m. GEN: Well nourished, well developed, in no acute distress  HEENT: normal  Neck: no JVD, carotid bruits, or masses Cardiac: RRR; no murmurs, rubs, or gallops,no edema  Respiratory:  clear to auscultation bilaterally, normal work of breathing GI: soft, nontender, nondistended, + BS MS: no deformity or atrophy  Skin: warm and dry, no rash Neuro:  Strength and sensation are intact Psych: euthymic mood, full affect    Recent Labs: 01/16/2016: ALT 47; BUN 29; Hemoglobin 13.0; Platelets 220; Potassium 4.5; Sodium 136 07/03/2016: Creatinine, Ser 1.40    Lipid Panel No results found for: CHOL, HDL, LDLCALC, TRIG    Wt Readings from Last 3 Encounters:  07/30/16 150 lb (68 kg)  05/10/16 158 lb (71.7 kg)  03/19/16 162 lb (73.5 kg)       ASSESSMENT AND PLAN:  Essential hypertension - Plan: EKG 12-Lead Blood pressure is well controlled on today's visit. No changes made to the medications. Recommended she hold torsemide for a day or 2 if blood pressure runs low   Paroxysmal atrial fibrillation (HCC) - Plan: EKG 12-Lead  maintaining normal sinus rhythm, on amiodarone, anticoagulation  No recent falls   Chronotropic incompetence with sinus node dysfunction  (HCC) - Plan: EKG  12-Lead Atrial paced rhythm,  No indication of atrial fibrillation on recent pacer download which was reviewed on today's visit  Chronic diastolic CHF (congestive heart failure) ([place]) - Plan: EKG 12-Lead Relatively euvolemic,  Recommended she stay on torsemide daily with Aldactone every other day If she has worsening renal function, may need to hold torsemide on the weekend   Total encounter time more than 15 minutes  Greater than 50% was spent in counseling and coordination of care with the patient   Disposition:   F/U  6weeks   No orders of the defined types were  placed in this encounter.    Signed, Esmond Plants, M.D., Ph.D. 07/30/2016  [place], [place]

## 2016-07-31 DIAGNOSIS — K7460 Unspecified cirrhosis of liver: Secondary | ICD-10-CM | POA: Diagnosis not present

## 2016-07-31 DIAGNOSIS — R0602 Shortness of breath: Secondary | ICD-10-CM | POA: Diagnosis not present

## 2016-07-31 DIAGNOSIS — N390 Urinary tract infection, site not specified: Secondary | ICD-10-CM | POA: Diagnosis not present

## 2016-07-31 DIAGNOSIS — I5032 Chronic diastolic (congestive) heart failure: Secondary | ICD-10-CM | POA: Diagnosis not present

## 2016-07-31 DIAGNOSIS — Z0000 Encounter for general adult medical examination without abnormal findings: Secondary | ICD-10-CM | POA: Diagnosis not present

## 2016-07-31 DIAGNOSIS — D649 Anemia, unspecified: Secondary | ICD-10-CM | POA: Diagnosis not present

## 2016-07-31 DIAGNOSIS — E079 Disorder of thyroid, unspecified: Secondary | ICD-10-CM | POA: Diagnosis not present

## 2016-07-31 DIAGNOSIS — F418 Other specified anxiety disorders: Secondary | ICD-10-CM | POA: Diagnosis not present

## 2016-07-31 DIAGNOSIS — I480 Paroxysmal atrial fibrillation: Secondary | ICD-10-CM | POA: Diagnosis not present

## 2016-07-31 DIAGNOSIS — I10 Essential (primary) hypertension: Secondary | ICD-10-CM | POA: Diagnosis not present

## 2016-08-08 DIAGNOSIS — I4891 Unspecified atrial fibrillation: Secondary | ICD-10-CM | POA: Diagnosis not present

## 2016-08-08 DIAGNOSIS — M6281 Muscle weakness (generalized): Secondary | ICD-10-CM | POA: Diagnosis not present

## 2016-08-08 DIAGNOSIS — I10 Essential (primary) hypertension: Secondary | ICD-10-CM | POA: Diagnosis not present

## 2016-08-08 DIAGNOSIS — G4733 Obstructive sleep apnea (adult) (pediatric): Secondary | ICD-10-CM | POA: Diagnosis not present

## 2016-08-08 DIAGNOSIS — M629 Disorder of muscle, unspecified: Secondary | ICD-10-CM | POA: Diagnosis not present

## 2016-08-28 DIAGNOSIS — D485 Neoplasm of uncertain behavior of skin: Secondary | ICD-10-CM | POA: Diagnosis not present

## 2016-08-28 DIAGNOSIS — Z08 Encounter for follow-up examination after completed treatment for malignant neoplasm: Secondary | ICD-10-CM | POA: Diagnosis not present

## 2016-08-28 DIAGNOSIS — C44622 Squamous cell carcinoma of skin of right upper limb, including shoulder: Secondary | ICD-10-CM | POA: Diagnosis not present

## 2016-08-28 DIAGNOSIS — X32XXXA Exposure to sunlight, initial encounter: Secondary | ICD-10-CM | POA: Diagnosis not present

## 2016-08-28 DIAGNOSIS — L570 Actinic keratosis: Secondary | ICD-10-CM | POA: Diagnosis not present

## 2016-08-28 DIAGNOSIS — L281 Prurigo nodularis: Secondary | ICD-10-CM | POA: Diagnosis not present

## 2016-08-28 DIAGNOSIS — Z85828 Personal history of other malignant neoplasm of skin: Secondary | ICD-10-CM | POA: Diagnosis not present

## 2016-09-04 DIAGNOSIS — H353113 Nonexudative age-related macular degeneration, right eye, advanced atrophic without subfoveal involvement: Secondary | ICD-10-CM | POA: Diagnosis not present

## 2016-09-04 DIAGNOSIS — H353221 Exudative age-related macular degeneration, left eye, with active choroidal neovascularization: Secondary | ICD-10-CM | POA: Diagnosis not present

## 2016-09-06 DIAGNOSIS — M629 Disorder of muscle, unspecified: Secondary | ICD-10-CM | POA: Diagnosis not present

## 2016-09-06 DIAGNOSIS — I4891 Unspecified atrial fibrillation: Secondary | ICD-10-CM | POA: Diagnosis not present

## 2016-09-06 DIAGNOSIS — G4733 Obstructive sleep apnea (adult) (pediatric): Secondary | ICD-10-CM | POA: Diagnosis not present

## 2016-09-06 DIAGNOSIS — M6281 Muscle weakness (generalized): Secondary | ICD-10-CM | POA: Diagnosis not present

## 2016-09-06 DIAGNOSIS — I10 Essential (primary) hypertension: Secondary | ICD-10-CM | POA: Diagnosis not present

## 2016-09-08 NOTE — Progress Notes (Signed)
Cardiology Office Note  Date:  09/11/2016   ID:  Kathryn Short, DOB [birth year], MRN [id number]  PCP:  Kathryn Harrier, MD   Chief Complaint  Patient presents with  . other    6 wk f/u no complaits today. Meds reviewed verbally with pt.    HPI:  Kathryn Short is an 83 year old woman with  paroxysmal atrial fibrillation (last episode in March 2831),  diastolic CHF on a  diuretic daily,  labile hypertension,  sick sinus syndrome, pacemaker placement followed by Kathryn Short,  in hospital Kathryn Short on 02/16/2012 in atrial fibrillation.  open cholecystectomy, partial colon resection and colostomy, appendectomy, hysterectomy, oophorectomy, GYN malignancy treated with radiation in the 1950s bowel obstruction December 2015 She presents for routine follow-up of her blood pressure, diastolic CHF and atrial fibrillation  She presents with her daughter on today's visit In follow-up today she reports that she is doing well, no leg swelling, no significant shortness of breath Main issue his legs are getting weaker, does not want to walk Seen by primary care, nursing home visit and possible physical therapy scheduled to start  On previous office visit she reported having symptoms of Vomiting, abdominal cramps Went to the hospital and had CT ABD:  aortic atherosclerotic Questionable mass but was seen by OB/GYN and was cleared  Takes clonidine and losartan as needed, not regular  Continues to take torsemide daily, Aldactone every other day Leg edema has resolved Ambulating, no recent falls, unsteady gait Blood pressure well controlled at home  Lab work reviewed on today's visit showing stable renal function, electrolytes Mild anemia hematocrit 32  Lab work  05/01/2016 CR 1.2,  UTI, took ABX Enterococcus, sen to nitrofirantoin total chol 207, LDL 112,  Lab work January 2018 creatinine 1.3 Most recent creatinine 1.2 Chronic urinary tract infection TSH 11  EKG on today's visit shows paced  rhythm, atrial paced, 61 bpm   Other past medical history clinic 8/9, Noted to have increased atrial fibrillation burden on pacer download January 03 2016 recommended she increase her amiodarone, is on anticoagulation Low BP, head fullness Recommended that she restart her Aldactone as blood pressure improves  Seen by Kathryn Short August 15, recommended she increase her torsemide for several days Seen by primary care 8/15: urine UTI No Urine cx available to date BMP shows creatinine continues to run higher consistent with prerenal state Taking torsemide daily with 1.5 torsemide on weekends, Aldactone added every other day  Previous CT scan done by primary care showing small to moderate right pleural effusion, evidence of cirrhosis Mention of cirrhosis back on CT scan in 2015 Mildly elevated LFTs over the past year  in the hospital 05/25/2014, with small bowel obstruction. She was placed on bowel rest, IV fluids and she passed her bowels. She did not require surgery  Echocardiogram done in the hospital 05/25/2014 showing ejection fraction greater than 55%, moderate pulmonary hypertension, mild to moderate TR  Earlier in 2015, she had atrial tachycardia and pacer was tracking the atrial rhythm, Her device was reprogrammed in the office for DDI  She was continued on her beta blocker and amiodarone and rhythm broke, converting back to normal sinus rhythm. Pacemaker has been since changed back to her prior settings  She is reluctant to use a walker, still uses a cane. Anticoagulation discussed previouslywith the patient and her daughter. They are not interested. They preferred to stay on aspirin  history of falls with trauma requiring stitches. None recently Patient and daughter are afraid of her  fall risk, other complications from anticoagulation. Currently not on anticoagulation at their request. They are aware of risks and benefits associated with arrhythmia and stroke.  Previous  lab work; Total cholesterol 189, LDL 104, hematocrit 38, creatinine 0.8  previous visits to the emergency room with chest pain, dizziness, malaise. She had a problem on isosorbide in the past. Feelings of insomnia, and anxious feeling, chest pain episodes at night that wake her from sleep, weakness, nausea, dizziness. She has obstructive sleep apnea and does not use her BiPAP at nighttime  Echocardiogram in the hospital showed ejection fraction 50-55%, moderate LVH, mild to moderate TR, right ventricular systolic pressure 40 mm of mercury, mild to moderate aortic valve insufficiency  PMH:   has a past medical history of Atrial fibrillation (Kathryn Short); Bowel obstruction; CHF (congestive heart failure) (Kathryn Short); Fall; GERD (gastroesophageal reflux disease); Hypertension; Hypothyroidism; Melanoma (Kathryn Short); OSA (obstructive sleep apnea); and Uterine cancer (Kathryn Short).  PSH:    Past Surgical History:  Procedure Laterality Date  . CHOLECYSTECTOMY    . COLOSTOMY     For perforation  . Cytoscopy    . EP IMPLANTABLE DEVICE N/A 09/07/2015   Procedure:  PPM Generator Changeout;  Surgeon: Kathryn Sprang, MD;  Location: [facility];  Service: Cardiovascular;  Laterality: N/A;  . INSERT / REPLACE / REMOVE PACEMAKER     Implantation-[employer]  . SKIN BIOPSY     face  . TOTAL ABDOMINAL HYSTERECTOMY W/ BILATERAL SALPINGOOPHORECTOMY      Current Outpatient Prescriptions  Medication Sig Dispense Refill  . acetaminophen (TYLENOL) 500 MG tablet Take 1,000 mg by mouth daily as needed for mild pain or headache.     . albuterol (PROVENTIL HFA;VENTOLIN HFA) 108 (90 BASE) MCG/ACT inhaler Inhale 2 puffs into the lungs every 6 (six) hours as needed for wheezing or shortness of breath.     Marland Kitchen amiodarone (PACERONE) 200 MG tablet Take 1 tablet (200 mg total) by mouth daily. 90 tablet 3  . b complex vitamins tablet Take 1 tablet by mouth daily.    . Carboxymethylcellul-Glycerin (LUBRICATING EYE DROPS OP) Place 2 drops into  both eyes 2 (two) times daily.    . cefUROXime (CEFTIN) 250 MG tablet Take 250 mg by mouth daily as needed.     . clonazePAM (KLONOPIN) 0.5 MG tablet Take 0.5 mg by mouth 2 (two) times daily as needed for anxiety.    . cloNIDine (CATAPRES) 0.1 MG tablet Take 1 tablet (0.1 mg total) by mouth as needed. 60 tablet 6  . ELIQUIS 5 MG TABS tablet TAKE ONE (1) TABLET BY MOUTH TWO (2) TIMES DAILY 60 tablet 3  . lactulose (CHRONULAC) 10 GM/15ML solution Take 30 g by mouth daily.     Marland Kitchen levothyroxine (SYNTHROID, LEVOTHROID) 50 MCG tablet Take 50 mcg by mouth daily.     Marland Kitchen losartan (COZAAR) 100 MG tablet Take 1 tablet (100 mg total) by mouth daily as needed. 30 tablet 6  . metoprolol (LOPRESSOR) 50 MG tablet Take 1 tablet (50 mg total) by mouth 2 (two) times daily. 180 tablet 3  . mometasone-formoterol (DULERA) 100-5 MCG/ACT AERO Inhale 2 puffs into the lungs 2 (two) times daily as needed.     . montelukast (SINGULAIR) 10 MG tablet Take 10 mg by mouth at bedtime.    . Multiple Vitamin (MULTIVITAMIN) tablet Take 1 tablet by mouth daily.    . Multiple Vitamins-Minerals (PRESERVISION AREDS 2 PO) Take by mouth 2 (two) times daily.    . nitrofurantoin (  MACRODANTIN) 100 MG capsule Take 100 mg by mouth 2 (two) times daily.    . nitroGLYCERIN (NITROSTAT) 0.4 MG SL tablet Place 1 tablet (0.4 mg total) under the tongue every 5 (five) minutes as needed for chest pain. 25 tablet 6  . potassium chloride (K-DUR,KLOR-CON) 10 MEQ tablet Take 1 tablet (10 mEq total) by mouth daily as needed. (Patient taking differently: Take 10 mEq by mouth daily. ) 90 tablet 3  . Probiotic Product (PROBIOTIC DAILY PO) Take 1 capsule by mouth daily.     . sertraline (ZOLOFT) 50 MG tablet Take 50 mg by mouth daily.    Marland Kitchen spironolactone (ALDACTONE) 25 MG tablet Take 25 mg by mouth every other day.     . torsemide (DEMADEX) 20 MG tablet as directed. Take one tablet (20 mg) by mouth once daily     . Vitamin D, Ergocalciferol, (DRISDOL) 50000 units  CAPS capsule Take 50,000 Units by mouth every 7 (seven) days.     No current facility-administered medications for this visit.      Allergies:   Ace inhibitors; Ambien [zolpidem tartrate]; Bactrim [sulfamethoxazole-trimethoprim]; Digoxin and related; Hctz [hydrochlorothiazide]; and Lunesta [eszopiclone]   Social History:  The patient  reports that she has never smoked. She has never used smokeless tobacco. She reports that she does not drink alcohol or use drugs.   Family History:   Family history is unknown by patient.    Review of Systems: Review of Systems  Constitutional: Negative.   Respiratory: Negative.   Cardiovascular: Negative.   Gastrointestinal: Negative.   Musculoskeletal: Negative.   Neurological: Negative.   Psychiatric/Behavioral: Negative.   All other systems reviewed and are negative.    PHYSICAL EXAM: VS:  BP 120/60 (BP Location: Right Arm, Patient Position: Sitting, Cuff Size: Normal)   Pulse 61   Ht 5\' 2"  (1.575 m)   Wt 149 lb 8 oz (67.8 kg)   BMI 27.34 kg/m  , BMI Body mass index is 27.34 kg/m. GEN: Well nourished, well developed, in no acute distress , Hard of hearing HEENT: normal  Neck: no JVD, carotid bruits, or masses Cardiac: RRR; no murmurs, rubs, or gallops,no edema  Respiratory:  clear to auscultation bilaterally, normal work of breathing GI: soft, nontender, nondistended, + BS MS: no deformity or atrophy  Skin: warm and dry, no rash Neuro:  Strength and sensation are intact Psych: euthymic mood, full affect    Recent Labs: 01/16/2016: ALT 47; BUN 29; Hemoglobin 13.0; Platelets 220; Potassium 4.5; Sodium 136 07/03/2016: Creatinine, Ser 1.40    Lipid Panel No results found for: CHOL, HDL, LDLCALC, TRIG    Wt Readings from Last 3 Encounters:  09/11/16 149 lb 8 oz (67.8 kg)  07/30/16 150 lb (68 kg)  05/10/16 158 lb (71.7 kg)       ASSESSMENT AND PLAN:  Essential hypertension - Plan: EKG 12-Lead Blood pressure well controlled,  no changes to her medications  Paroxysmal atrial fibrillation (HCC) - Plan: EKG 12-Lead Paced rhythm, tolerating anticoagulation On low-dose amiodarone  Chronotropic incompetence with sinus node dysfunction  (HCC) - Plan: EKG 12-Lead Atrial paced rhythm,  Previously with no atrial fibrillation on pacer download  Chronic diastolic CHF (congestive heart failure) ([place]) - Plan: EKG 12-Lead Relatively euvolemic,   on torsemide daily with Aldactone every other day BMP within normal range   Total encounter time more than 15 minutes  Greater than 50% was spent in counseling and coordination of care with the patient   Disposition:  F/U  6weeks    Orders Placed This Encounter  Procedures  . EKG 12-Lead     Signed, Esmond Plants, M.D., Ph.D. 09/11/2016  [place], [place]

## 2016-09-10 DIAGNOSIS — K7460 Unspecified cirrhosis of liver: Secondary | ICD-10-CM | POA: Diagnosis not present

## 2016-09-10 DIAGNOSIS — I480 Paroxysmal atrial fibrillation: Secondary | ICD-10-CM | POA: Diagnosis not present

## 2016-09-10 DIAGNOSIS — I5032 Chronic diastolic (congestive) heart failure: Secondary | ICD-10-CM | POA: Diagnosis not present

## 2016-09-10 DIAGNOSIS — F418 Other specified anxiety disorders: Secondary | ICD-10-CM | POA: Diagnosis not present

## 2016-09-10 DIAGNOSIS — N390 Urinary tract infection, site not specified: Secondary | ICD-10-CM | POA: Diagnosis not present

## 2016-09-10 DIAGNOSIS — D649 Anemia, unspecified: Secondary | ICD-10-CM | POA: Diagnosis not present

## 2016-09-10 DIAGNOSIS — E079 Disorder of thyroid, unspecified: Secondary | ICD-10-CM | POA: Diagnosis not present

## 2016-09-10 DIAGNOSIS — R29898 Other symptoms and signs involving the musculoskeletal system: Secondary | ICD-10-CM | POA: Diagnosis not present

## 2016-09-10 DIAGNOSIS — I10 Essential (primary) hypertension: Secondary | ICD-10-CM | POA: Diagnosis not present

## 2016-09-11 ENCOUNTER — Encounter: Payer: Self-pay | Admitting: Cardiovascular Disease

## 2016-09-11 ENCOUNTER — Ambulatory Visit (INDEPENDENT_AMBULATORY_CARE_PROVIDER_SITE_OTHER): Payer: Medicare HMO | Admitting: Cardiovascular Disease

## 2016-09-11 VITALS — BP 120/60 | HR 61 | Ht 62.0 in | Wt 149.5 lb

## 2016-09-11 DIAGNOSIS — I480 Paroxysmal atrial fibrillation: Secondary | ICD-10-CM | POA: Diagnosis not present

## 2016-09-11 DIAGNOSIS — I471 Supraventricular tachycardia: Secondary | ICD-10-CM

## 2016-09-11 DIAGNOSIS — Z950 Presence of cardiac pacemaker: Secondary | ICD-10-CM | POA: Diagnosis not present

## 2016-09-11 DIAGNOSIS — I5032 Chronic diastolic (congestive) heart failure: Secondary | ICD-10-CM | POA: Diagnosis not present

## 2016-09-11 DIAGNOSIS — I495 Sick sinus syndrome: Secondary | ICD-10-CM | POA: Diagnosis not present

## 2016-09-11 DIAGNOSIS — R600 Localized edema: Secondary | ICD-10-CM | POA: Diagnosis not present

## 2016-09-11 DIAGNOSIS — I10 Essential (primary) hypertension: Secondary | ICD-10-CM

## 2016-09-11 NOTE — Patient Instructions (Signed)
Medication Instructions:   No medication changes made  Labwork:  No new labs needed  Testing/Procedures:  No further testing at this time   I recommend watching educational videos on topics of interest to you at:       [email or web address].[email or web address]  Enter code: [email or web address]    Follow-Up: It was a pleasure seeing you in the office today. Please call us if you have new issues that need to be addressed before your next appt.  [phone]  Your physician wants you to follow-up in: 6 weeks    If you need a refill on your cardiac medications before your next appointment, please call your pharmacy.

## 2016-09-12 ENCOUNTER — Other Ambulatory Visit: Payer: Self-pay | Admitting: Cardiovascular Disease

## 2016-09-12 NOTE — Telephone Encounter (Signed)
Requested Prescriptions   Signed Prescriptions Disp Refills  . ELIQUIS 5 MG TABS tablet 60 tablet 3    Sig: TAKE ONE TABLET BY MOUTH TWICE DAILY    Authorizing Provider: Minna Merritts    Ordering User: Janan Ridge

## 2016-09-26 DIAGNOSIS — I2510 Atherosclerotic heart disease of native coronary artery without angina pectoris: Secondary | ICD-10-CM | POA: Diagnosis not present

## 2016-09-26 DIAGNOSIS — I5030 Unspecified diastolic (congestive) heart failure: Secondary | ICD-10-CM | POA: Diagnosis not present

## 2016-09-26 DIAGNOSIS — K7460 Unspecified cirrhosis of liver: Secondary | ICD-10-CM | POA: Diagnosis not present

## 2016-09-26 DIAGNOSIS — M6281 Muscle weakness (generalized): Secondary | ICD-10-CM | POA: Diagnosis not present

## 2016-09-26 DIAGNOSIS — I110 Hypertensive heart disease with heart failure: Secondary | ICD-10-CM | POA: Diagnosis not present

## 2016-09-26 DIAGNOSIS — F329 Major depressive disorder, single episode, unspecified: Secondary | ICD-10-CM | POA: Diagnosis not present

## 2016-09-26 DIAGNOSIS — I480 Paroxysmal atrial fibrillation: Secondary | ICD-10-CM | POA: Diagnosis not present

## 2016-09-26 DIAGNOSIS — M810 Age-related osteoporosis without current pathological fracture: Secondary | ICD-10-CM | POA: Diagnosis not present

## 2016-09-26 DIAGNOSIS — D649 Anemia, unspecified: Secondary | ICD-10-CM | POA: Diagnosis not present

## 2016-10-04 DIAGNOSIS — M6281 Muscle weakness (generalized): Secondary | ICD-10-CM | POA: Diagnosis not present

## 2016-10-04 DIAGNOSIS — I480 Paroxysmal atrial fibrillation: Secondary | ICD-10-CM | POA: Diagnosis not present

## 2016-10-04 DIAGNOSIS — I110 Hypertensive heart disease with heart failure: Secondary | ICD-10-CM | POA: Diagnosis not present

## 2016-10-04 DIAGNOSIS — D649 Anemia, unspecified: Secondary | ICD-10-CM | POA: Diagnosis not present

## 2016-10-04 DIAGNOSIS — M810 Age-related osteoporosis without current pathological fracture: Secondary | ICD-10-CM | POA: Diagnosis not present

## 2016-10-04 DIAGNOSIS — K7460 Unspecified cirrhosis of liver: Secondary | ICD-10-CM | POA: Diagnosis not present

## 2016-10-04 DIAGNOSIS — F329 Major depressive disorder, single episode, unspecified: Secondary | ICD-10-CM | POA: Diagnosis not present

## 2016-10-04 DIAGNOSIS — I2510 Atherosclerotic heart disease of native coronary artery without angina pectoris: Secondary | ICD-10-CM | POA: Diagnosis not present

## 2016-10-04 DIAGNOSIS — I5030 Unspecified diastolic (congestive) heart failure: Secondary | ICD-10-CM | POA: Diagnosis not present

## 2016-10-06 DIAGNOSIS — I480 Paroxysmal atrial fibrillation: Secondary | ICD-10-CM | POA: Diagnosis not present

## 2016-10-06 DIAGNOSIS — K7460 Unspecified cirrhosis of liver: Secondary | ICD-10-CM | POA: Diagnosis not present

## 2016-10-06 DIAGNOSIS — M6281 Muscle weakness (generalized): Secondary | ICD-10-CM | POA: Diagnosis not present

## 2016-10-06 DIAGNOSIS — I5030 Unspecified diastolic (congestive) heart failure: Secondary | ICD-10-CM | POA: Diagnosis not present

## 2016-10-06 DIAGNOSIS — M810 Age-related osteoporosis without current pathological fracture: Secondary | ICD-10-CM | POA: Diagnosis not present

## 2016-10-06 DIAGNOSIS — I2510 Atherosclerotic heart disease of native coronary artery without angina pectoris: Secondary | ICD-10-CM | POA: Diagnosis not present

## 2016-10-06 DIAGNOSIS — F329 Major depressive disorder, single episode, unspecified: Secondary | ICD-10-CM | POA: Diagnosis not present

## 2016-10-06 DIAGNOSIS — D649 Anemia, unspecified: Secondary | ICD-10-CM | POA: Diagnosis not present

## 2016-10-06 DIAGNOSIS — I110 Hypertensive heart disease with heart failure: Secondary | ICD-10-CM | POA: Diagnosis not present

## 2016-10-07 DIAGNOSIS — I4891 Unspecified atrial fibrillation: Secondary | ICD-10-CM | POA: Diagnosis not present

## 2016-10-07 DIAGNOSIS — I10 Essential (primary) hypertension: Secondary | ICD-10-CM | POA: Diagnosis not present

## 2016-10-07 DIAGNOSIS — M629 Disorder of muscle, unspecified: Secondary | ICD-10-CM | POA: Diagnosis not present

## 2016-10-07 DIAGNOSIS — M6281 Muscle weakness (generalized): Secondary | ICD-10-CM | POA: Diagnosis not present

## 2016-10-07 DIAGNOSIS — G4733 Obstructive sleep apnea (adult) (pediatric): Secondary | ICD-10-CM | POA: Diagnosis not present

## 2016-10-09 DIAGNOSIS — M6281 Muscle weakness (generalized): Secondary | ICD-10-CM | POA: Diagnosis not present

## 2016-10-09 DIAGNOSIS — I110 Hypertensive heart disease with heart failure: Secondary | ICD-10-CM | POA: Diagnosis not present

## 2016-10-09 DIAGNOSIS — I2510 Atherosclerotic heart disease of native coronary artery without angina pectoris: Secondary | ICD-10-CM | POA: Diagnosis not present

## 2016-10-09 DIAGNOSIS — I480 Paroxysmal atrial fibrillation: Secondary | ICD-10-CM | POA: Diagnosis not present

## 2016-10-09 DIAGNOSIS — I5030 Unspecified diastolic (congestive) heart failure: Secondary | ICD-10-CM | POA: Diagnosis not present

## 2016-10-10 DIAGNOSIS — D649 Anemia, unspecified: Secondary | ICD-10-CM | POA: Diagnosis not present

## 2016-10-10 DIAGNOSIS — I2510 Atherosclerotic heart disease of native coronary artery without angina pectoris: Secondary | ICD-10-CM | POA: Diagnosis not present

## 2016-10-10 DIAGNOSIS — M810 Age-related osteoporosis without current pathological fracture: Secondary | ICD-10-CM | POA: Diagnosis not present

## 2016-10-10 DIAGNOSIS — I5030 Unspecified diastolic (congestive) heart failure: Secondary | ICD-10-CM | POA: Diagnosis not present

## 2016-10-10 DIAGNOSIS — F329 Major depressive disorder, single episode, unspecified: Secondary | ICD-10-CM | POA: Diagnosis not present

## 2016-10-10 DIAGNOSIS — I110 Hypertensive heart disease with heart failure: Secondary | ICD-10-CM | POA: Diagnosis not present

## 2016-10-10 DIAGNOSIS — M6281 Muscle weakness (generalized): Secondary | ICD-10-CM | POA: Diagnosis not present

## 2016-10-10 DIAGNOSIS — I480 Paroxysmal atrial fibrillation: Secondary | ICD-10-CM | POA: Diagnosis not present

## 2016-10-10 DIAGNOSIS — K7460 Unspecified cirrhosis of liver: Secondary | ICD-10-CM | POA: Diagnosis not present

## 2016-10-12 DIAGNOSIS — M6281 Muscle weakness (generalized): Secondary | ICD-10-CM | POA: Diagnosis not present

## 2016-10-12 DIAGNOSIS — D649 Anemia, unspecified: Secondary | ICD-10-CM | POA: Diagnosis not present

## 2016-10-12 DIAGNOSIS — K7460 Unspecified cirrhosis of liver: Secondary | ICD-10-CM | POA: Diagnosis not present

## 2016-10-12 DIAGNOSIS — I2510 Atherosclerotic heart disease of native coronary artery without angina pectoris: Secondary | ICD-10-CM | POA: Diagnosis not present

## 2016-10-12 DIAGNOSIS — I480 Paroxysmal atrial fibrillation: Secondary | ICD-10-CM | POA: Diagnosis not present

## 2016-10-12 DIAGNOSIS — I110 Hypertensive heart disease with heart failure: Secondary | ICD-10-CM | POA: Diagnosis not present

## 2016-10-12 DIAGNOSIS — I5030 Unspecified diastolic (congestive) heart failure: Secondary | ICD-10-CM | POA: Diagnosis not present

## 2016-10-12 DIAGNOSIS — F329 Major depressive disorder, single episode, unspecified: Secondary | ICD-10-CM | POA: Diagnosis not present

## 2016-10-12 DIAGNOSIS — M810 Age-related osteoporosis without current pathological fracture: Secondary | ICD-10-CM | POA: Diagnosis not present

## 2016-10-15 DIAGNOSIS — X32XXXA Exposure to sunlight, initial encounter: Secondary | ICD-10-CM | POA: Diagnosis not present

## 2016-10-15 DIAGNOSIS — C44622 Squamous cell carcinoma of skin of right upper limb, including shoulder: Secondary | ICD-10-CM | POA: Diagnosis not present

## 2016-10-15 DIAGNOSIS — L570 Actinic keratosis: Secondary | ICD-10-CM | POA: Diagnosis not present

## 2016-10-16 DIAGNOSIS — F329 Major depressive disorder, single episode, unspecified: Secondary | ICD-10-CM | POA: Diagnosis not present

## 2016-10-16 DIAGNOSIS — M810 Age-related osteoporosis without current pathological fracture: Secondary | ICD-10-CM | POA: Diagnosis not present

## 2016-10-16 DIAGNOSIS — D649 Anemia, unspecified: Secondary | ICD-10-CM | POA: Diagnosis not present

## 2016-10-16 DIAGNOSIS — I110 Hypertensive heart disease with heart failure: Secondary | ICD-10-CM | POA: Diagnosis not present

## 2016-10-16 DIAGNOSIS — I5030 Unspecified diastolic (congestive) heart failure: Secondary | ICD-10-CM | POA: Diagnosis not present

## 2016-10-16 DIAGNOSIS — I480 Paroxysmal atrial fibrillation: Secondary | ICD-10-CM | POA: Diagnosis not present

## 2016-10-16 DIAGNOSIS — K7460 Unspecified cirrhosis of liver: Secondary | ICD-10-CM | POA: Diagnosis not present

## 2016-10-16 DIAGNOSIS — I2510 Atherosclerotic heart disease of native coronary artery without angina pectoris: Secondary | ICD-10-CM | POA: Diagnosis not present

## 2016-10-16 DIAGNOSIS — M6281 Muscle weakness (generalized): Secondary | ICD-10-CM | POA: Diagnosis not present

## 2016-10-18 DIAGNOSIS — I5030 Unspecified diastolic (congestive) heart failure: Secondary | ICD-10-CM | POA: Diagnosis not present

## 2016-10-18 DIAGNOSIS — F329 Major depressive disorder, single episode, unspecified: Secondary | ICD-10-CM | POA: Diagnosis not present

## 2016-10-18 DIAGNOSIS — I480 Paroxysmal atrial fibrillation: Secondary | ICD-10-CM | POA: Diagnosis not present

## 2016-10-18 DIAGNOSIS — M810 Age-related osteoporosis without current pathological fracture: Secondary | ICD-10-CM | POA: Diagnosis not present

## 2016-10-18 DIAGNOSIS — K7460 Unspecified cirrhosis of liver: Secondary | ICD-10-CM | POA: Diagnosis not present

## 2016-10-18 DIAGNOSIS — M6281 Muscle weakness (generalized): Secondary | ICD-10-CM | POA: Diagnosis not present

## 2016-10-18 DIAGNOSIS — I110 Hypertensive heart disease with heart failure: Secondary | ICD-10-CM | POA: Diagnosis not present

## 2016-10-18 DIAGNOSIS — D649 Anemia, unspecified: Secondary | ICD-10-CM | POA: Diagnosis not present

## 2016-10-18 DIAGNOSIS — I2510 Atherosclerotic heart disease of native coronary artery without angina pectoris: Secondary | ICD-10-CM | POA: Diagnosis not present

## 2016-10-22 DIAGNOSIS — H353221 Exudative age-related macular degeneration, left eye, with active choroidal neovascularization: Secondary | ICD-10-CM | POA: Diagnosis not present

## 2016-10-24 ENCOUNTER — Ambulatory Visit (INDEPENDENT_AMBULATORY_CARE_PROVIDER_SITE_OTHER): Payer: Medicare HMO | Admitting: *Deleted

## 2016-10-24 DIAGNOSIS — F329 Major depressive disorder, single episode, unspecified: Secondary | ICD-10-CM | POA: Diagnosis not present

## 2016-10-24 DIAGNOSIS — I2510 Atherosclerotic heart disease of native coronary artery without angina pectoris: Secondary | ICD-10-CM | POA: Diagnosis not present

## 2016-10-24 DIAGNOSIS — M6281 Muscle weakness (generalized): Secondary | ICD-10-CM | POA: Diagnosis not present

## 2016-10-24 DIAGNOSIS — I5030 Unspecified diastolic (congestive) heart failure: Secondary | ICD-10-CM | POA: Diagnosis not present

## 2016-10-24 DIAGNOSIS — M810 Age-related osteoporosis without current pathological fracture: Secondary | ICD-10-CM | POA: Diagnosis not present

## 2016-10-24 DIAGNOSIS — I495 Sick sinus syndrome: Secondary | ICD-10-CM | POA: Diagnosis not present

## 2016-10-24 DIAGNOSIS — D649 Anemia, unspecified: Secondary | ICD-10-CM | POA: Diagnosis not present

## 2016-10-24 DIAGNOSIS — I110 Hypertensive heart disease with heart failure: Secondary | ICD-10-CM | POA: Diagnosis not present

## 2016-10-24 DIAGNOSIS — K7460 Unspecified cirrhosis of liver: Secondary | ICD-10-CM | POA: Diagnosis not present

## 2016-10-24 DIAGNOSIS — I480 Paroxysmal atrial fibrillation: Secondary | ICD-10-CM | POA: Diagnosis not present

## 2016-10-24 LAB — CUP PACEART REMOTE DEVICE CHECK
Battery Voltage: 2.79 V
Brady Statistic AP VS Percent: 100 %
Brady Statistic AS VP Percent: 0 %
Date Time Interrogation Session: 20180516154502
Implantable Lead Implant Date: 20080813
Implantable Lead Location: 753859
Implantable Lead Location: 753860
Implantable Lead Model: 5076
Lead Channel Impedance Value: 361 Ohm
Lead Channel Impedance Value: 432 Ohm
Lead Channel Pacing Threshold Amplitude: 1 V
Lead Channel Pacing Threshold Amplitude: 1.625 V
Lead Channel Pacing Threshold Pulse Width: 0.4 ms
Lead Channel Pacing Threshold Pulse Width: 0.4 ms
Lead Channel Setting Pacing Amplitude: 2 V
Lead Channel Setting Pacing Amplitude: 3.25 V
Lead Channel Setting Pacing Pulse Width: 0.4 ms
Lead Channel Setting Sensing Sensitivity: 2.8 mV
MDC IDC LEAD IMPLANT DT: 20080813
MDC IDC MSMT BATTERY IMPEDANCE: 127 Ohm
MDC IDC MSMT BATTERY REMAINING LONGEVITY: 130 mo
MDC IDC PG IMPLANT DT: 20170329
MDC IDC STAT BRADY AP VP PERCENT: 0 %
MDC IDC STAT BRADY AS VS PERCENT: 0 %

## 2016-10-24 NOTE — Progress Notes (Signed)
Remote pacemaker transmission.   

## 2016-10-25 ENCOUNTER — Encounter: Payer: Self-pay | Admitting: Cardiology

## 2016-10-25 NOTE — Progress Notes (Signed)
Letter  

## 2016-10-26 ENCOUNTER — Ambulatory Visit: Payer: Medicare HMO | Admitting: Cardiovascular Disease

## 2016-10-26 DIAGNOSIS — F329 Major depressive disorder, single episode, unspecified: Secondary | ICD-10-CM | POA: Diagnosis not present

## 2016-10-26 DIAGNOSIS — I5030 Unspecified diastolic (congestive) heart failure: Secondary | ICD-10-CM | POA: Diagnosis not present

## 2016-10-26 DIAGNOSIS — D649 Anemia, unspecified: Secondary | ICD-10-CM | POA: Diagnosis not present

## 2016-10-26 DIAGNOSIS — M810 Age-related osteoporosis without current pathological fracture: Secondary | ICD-10-CM | POA: Diagnosis not present

## 2016-10-26 DIAGNOSIS — K7460 Unspecified cirrhosis of liver: Secondary | ICD-10-CM | POA: Diagnosis not present

## 2016-10-26 DIAGNOSIS — I480 Paroxysmal atrial fibrillation: Secondary | ICD-10-CM | POA: Diagnosis not present

## 2016-10-26 DIAGNOSIS — I2510 Atherosclerotic heart disease of native coronary artery without angina pectoris: Secondary | ICD-10-CM | POA: Diagnosis not present

## 2016-10-26 DIAGNOSIS — I110 Hypertensive heart disease with heart failure: Secondary | ICD-10-CM | POA: Diagnosis not present

## 2016-10-26 DIAGNOSIS — M6281 Muscle weakness (generalized): Secondary | ICD-10-CM | POA: Diagnosis not present

## 2016-10-30 DIAGNOSIS — D649 Anemia, unspecified: Secondary | ICD-10-CM | POA: Diagnosis not present

## 2016-10-30 DIAGNOSIS — K7460 Unspecified cirrhosis of liver: Secondary | ICD-10-CM | POA: Diagnosis not present

## 2016-10-30 DIAGNOSIS — I10 Essential (primary) hypertension: Secondary | ICD-10-CM | POA: Diagnosis not present

## 2016-10-30 DIAGNOSIS — E785 Hyperlipidemia, unspecified: Secondary | ICD-10-CM | POA: Diagnosis not present

## 2016-10-30 DIAGNOSIS — I5030 Unspecified diastolic (congestive) heart failure: Secondary | ICD-10-CM | POA: Diagnosis not present

## 2016-10-30 DIAGNOSIS — M6281 Muscle weakness (generalized): Secondary | ICD-10-CM | POA: Diagnosis not present

## 2016-10-30 DIAGNOSIS — J069 Acute upper respiratory infection, unspecified: Secondary | ICD-10-CM | POA: Diagnosis not present

## 2016-10-30 DIAGNOSIS — M810 Age-related osteoporosis without current pathological fracture: Secondary | ICD-10-CM | POA: Diagnosis not present

## 2016-10-30 DIAGNOSIS — R531 Weakness: Secondary | ICD-10-CM | POA: Diagnosis not present

## 2016-10-30 DIAGNOSIS — I110 Hypertensive heart disease with heart failure: Secondary | ICD-10-CM | POA: Diagnosis not present

## 2016-10-30 DIAGNOSIS — F329 Major depressive disorder, single episode, unspecified: Secondary | ICD-10-CM | POA: Diagnosis not present

## 2016-10-30 DIAGNOSIS — Z79899 Other long term (current) drug therapy: Secondary | ICD-10-CM | POA: Diagnosis not present

## 2016-10-30 DIAGNOSIS — I5032 Chronic diastolic (congestive) heart failure: Secondary | ICD-10-CM | POA: Diagnosis not present

## 2016-10-30 DIAGNOSIS — I2510 Atherosclerotic heart disease of native coronary artery without angina pectoris: Secondary | ICD-10-CM | POA: Diagnosis not present

## 2016-10-30 DIAGNOSIS — I480 Paroxysmal atrial fibrillation: Secondary | ICD-10-CM | POA: Diagnosis not present

## 2016-11-01 ENCOUNTER — Ambulatory Visit: Payer: Medicare HMO | Admitting: Cardiovascular Disease

## 2016-11-01 DIAGNOSIS — M6281 Muscle weakness (generalized): Secondary | ICD-10-CM | POA: Diagnosis not present

## 2016-11-01 DIAGNOSIS — M810 Age-related osteoporosis without current pathological fracture: Secondary | ICD-10-CM | POA: Diagnosis not present

## 2016-11-01 DIAGNOSIS — I2510 Atherosclerotic heart disease of native coronary artery without angina pectoris: Secondary | ICD-10-CM | POA: Diagnosis not present

## 2016-11-01 DIAGNOSIS — D649 Anemia, unspecified: Secondary | ICD-10-CM | POA: Diagnosis not present

## 2016-11-01 DIAGNOSIS — I110 Hypertensive heart disease with heart failure: Secondary | ICD-10-CM | POA: Diagnosis not present

## 2016-11-01 DIAGNOSIS — K7460 Unspecified cirrhosis of liver: Secondary | ICD-10-CM | POA: Diagnosis not present

## 2016-11-01 DIAGNOSIS — F329 Major depressive disorder, single episode, unspecified: Secondary | ICD-10-CM | POA: Diagnosis not present

## 2016-11-01 DIAGNOSIS — I5030 Unspecified diastolic (congestive) heart failure: Secondary | ICD-10-CM | POA: Diagnosis not present

## 2016-11-01 DIAGNOSIS — I480 Paroxysmal atrial fibrillation: Secondary | ICD-10-CM | POA: Diagnosis not present

## 2016-11-07 DIAGNOSIS — D649 Anemia, unspecified: Secondary | ICD-10-CM | POA: Diagnosis not present

## 2016-11-07 DIAGNOSIS — I480 Paroxysmal atrial fibrillation: Secondary | ICD-10-CM | POA: Diagnosis not present

## 2016-11-07 DIAGNOSIS — F329 Major depressive disorder, single episode, unspecified: Secondary | ICD-10-CM | POA: Diagnosis not present

## 2016-11-07 DIAGNOSIS — I5030 Unspecified diastolic (congestive) heart failure: Secondary | ICD-10-CM | POA: Diagnosis not present

## 2016-11-07 DIAGNOSIS — M810 Age-related osteoporosis without current pathological fracture: Secondary | ICD-10-CM | POA: Diagnosis not present

## 2016-11-07 DIAGNOSIS — M6281 Muscle weakness (generalized): Secondary | ICD-10-CM | POA: Diagnosis not present

## 2016-11-07 DIAGNOSIS — I110 Hypertensive heart disease with heart failure: Secondary | ICD-10-CM | POA: Diagnosis not present

## 2016-11-07 DIAGNOSIS — K7460 Unspecified cirrhosis of liver: Secondary | ICD-10-CM | POA: Diagnosis not present

## 2016-11-07 DIAGNOSIS — I2510 Atherosclerotic heart disease of native coronary artery without angina pectoris: Secondary | ICD-10-CM | POA: Diagnosis not present

## 2016-11-09 DIAGNOSIS — D649 Anemia, unspecified: Secondary | ICD-10-CM | POA: Diagnosis not present

## 2016-11-09 DIAGNOSIS — I2510 Atherosclerotic heart disease of native coronary artery without angina pectoris: Secondary | ICD-10-CM | POA: Diagnosis not present

## 2016-11-09 DIAGNOSIS — I480 Paroxysmal atrial fibrillation: Secondary | ICD-10-CM | POA: Diagnosis not present

## 2016-11-09 DIAGNOSIS — F329 Major depressive disorder, single episode, unspecified: Secondary | ICD-10-CM | POA: Diagnosis not present

## 2016-11-09 DIAGNOSIS — M810 Age-related osteoporosis without current pathological fracture: Secondary | ICD-10-CM | POA: Diagnosis not present

## 2016-11-09 DIAGNOSIS — M6281 Muscle weakness (generalized): Secondary | ICD-10-CM | POA: Diagnosis not present

## 2016-11-09 DIAGNOSIS — K7460 Unspecified cirrhosis of liver: Secondary | ICD-10-CM | POA: Diagnosis not present

## 2016-11-09 DIAGNOSIS — I5030 Unspecified diastolic (congestive) heart failure: Secondary | ICD-10-CM | POA: Diagnosis not present

## 2016-11-09 DIAGNOSIS — I110 Hypertensive heart disease with heart failure: Secondary | ICD-10-CM | POA: Diagnosis not present

## 2016-11-12 DIAGNOSIS — R309 Painful micturition, unspecified: Secondary | ICD-10-CM | POA: Diagnosis not present

## 2016-11-12 DIAGNOSIS — H6123 Impacted cerumen, bilateral: Secondary | ICD-10-CM | POA: Diagnosis not present

## 2016-11-14 DIAGNOSIS — F329 Major depressive disorder, single episode, unspecified: Secondary | ICD-10-CM | POA: Diagnosis not present

## 2016-11-14 DIAGNOSIS — M810 Age-related osteoporosis without current pathological fracture: Secondary | ICD-10-CM | POA: Diagnosis not present

## 2016-11-14 DIAGNOSIS — K7460 Unspecified cirrhosis of liver: Secondary | ICD-10-CM | POA: Diagnosis not present

## 2016-11-14 DIAGNOSIS — I5030 Unspecified diastolic (congestive) heart failure: Secondary | ICD-10-CM | POA: Diagnosis not present

## 2016-11-14 DIAGNOSIS — I2510 Atherosclerotic heart disease of native coronary artery without angina pectoris: Secondary | ICD-10-CM | POA: Diagnosis not present

## 2016-11-14 DIAGNOSIS — I480 Paroxysmal atrial fibrillation: Secondary | ICD-10-CM | POA: Diagnosis not present

## 2016-11-14 DIAGNOSIS — D649 Anemia, unspecified: Secondary | ICD-10-CM | POA: Diagnosis not present

## 2016-11-14 DIAGNOSIS — M6281 Muscle weakness (generalized): Secondary | ICD-10-CM | POA: Diagnosis not present

## 2016-11-14 DIAGNOSIS — I110 Hypertensive heart disease with heart failure: Secondary | ICD-10-CM | POA: Diagnosis not present

## 2016-11-21 NOTE — Progress Notes (Signed)
Cardiology Office Note  Date:  11/22/2016   ID:  HAREEM SUROWIEC, DOB 05-08-1922, MRN [id number]  PCP:  Tracie Harrier, MD   Chief Complaint  Patient presents with  . other    6 week follow up. Patient c/o left leg swelling and SOB. Meds reviewed verbally with patient.     HPI:  Ms. Seto is an 83 year old woman with  paroxysmal atrial fibrillation (last episode in March 0277),  diastolic CHF on a  diuretic daily,  labile hypertension,  sick sinus syndrome, pacemaker placement followed by Dr. Caryl Comes,  in hospital [facility] on 02/16/2012 in atrial fibrillation.  open cholecystectomy, partial colon resection and colostomy, appendectomy, hysterectomy, oophorectomy, GYN malignancy treated with radiation in the 1950s bowel obstruction December 2015 She presents for routine follow-up of her blood pressure, diastolic CHF and atrial fibrillation  She presents with her daughter on today's visit Worsening leg swelling 1+ B/L Compared to her last clinic visit BP stable "Low in evening", 412 systolic On torsemide daily Eating out a lot, which is new  Takes clonidine and losartan as needed, not regular  Continues to take torsemide daily, Aldactone every other day Ambulating, no recent falls, unsteady gait Blood pressure well controlled at home  LABS:Reviewed with her in detail on today's visit HCT 32 Cr 1.2 TSH 11 down to 8   no significant shortness of breath Working with physical therapy  EKG personally reviewed by myself on todays visit Shows paced rhythm 62 bpm no significant ST or T-wave changes  Other past medical history reviewed  On previous office visit she reported having symptoms of Vomiting, abdominal cramps Went to the hospital and had CT ABD:  aortic atherosclerotic Questionable mass but was seen by OB/GYN and was cleared  Lab work  05/01/2016 CR 1.2,  UTI, took ABX Enterococcus, sen to nitrofirantoin total chol 207, LDL 112,  Lab work January 2018  creatinine 1.3 Most recent creatinine 1.2 Chronic urinary tract infection TSH 11  EKG on today's visit shows paced rhythm, atrial paced, 61 bpm   Other past medical history clinic 8/9, Noted to have increased atrial fibrillation burden on pacer download January 03 2016 recommended she increase her amiodarone, is on anticoagulation Low BP, head fullness Recommended that she restart her Aldactone as blood pressure improves  Seen by Dr. Caryl Comes August 15, recommended she increase her torsemide for several days Seen by primary care 8/15: urine UTI No Urine cx available to date BMP shows creatinine continues to run higher consistent with prerenal state Taking torsemide daily with 1.5 torsemide on weekends, Aldactone added every other day  Previous CT scan done by primary care showing small to moderate right pleural effusion, evidence of cirrhosis Mention of cirrhosis back on CT scan in 2015 Mildly elevated LFTs over the past year  in the hospital 05/25/2014, with small bowel obstruction. She was placed on bowel rest, IV fluids and she passed her bowels. She did not require surgery  Echocardiogram done in the hospital 05/25/2014 showing ejection fraction greater than 55%, moderate pulmonary hypertension, mild to moderate TR  Earlier in 2015, she had atrial tachycardia and pacer was tracking the atrial rhythm, Her device was reprogrammed in the office for DDI  She was continued on her beta blocker and amiodarone and rhythm broke, converting back to normal sinus rhythm. Pacemaker has been since changed back to her prior settings  She is reluctant to use a walker, still uses a cane. Anticoagulation discussed previouslywith the patient and her daughter. They  are not interested. They preferred to stay on aspirin  history of falls with trauma requiring stitches. None recently Patient and daughter are afraid of her fall risk, other complications from anticoagulation. Currently not on  anticoagulation at their request. They are aware of risks and benefits associated with arrhythmia and stroke.  Previous lab work; Total cholesterol 189, LDL 104, hematocrit 38, creatinine 0.8  previous visits to the emergency room with chest pain, dizziness, malaise. She had a problem on isosorbide in the past. Feelings of insomnia, and anxious feeling, chest pain episodes at night that wake her from sleep, weakness, nausea, dizziness. She has obstructive sleep apnea and does not use her BiPAP at nighttime  Echocardiogram in the hospital showed ejection fraction 50-55%, moderate LVH, mild to moderate TR, right ventricular systolic pressure 40 mm of mercury, mild to moderate aortic valve insufficiency  PMH:   has a past medical history of Atrial fibrillation ([place]); Bowel obstruction (HCC); CHF (congestive heart failure) ([place]); Fall; GERD (gastroesophageal reflux disease); Hypertension; Hypothyroidism; Melanoma ([place]); OSA (obstructive sleep apnea); and Uterine cancer ([place]).  PSH:    Past Surgical History:  Procedure Laterality Date  . CHOLECYSTECTOMY    . COLOSTOMY     For perforation  . Cytoscopy    . EP IMPLANTABLE DEVICE N/A 09/07/2015   Procedure:  PPM Generator Changeout;  Surgeon: Deboraha Sprang, MD;  Location: [facility];  Service: Cardiovascular;  Laterality: N/A;  . INSERT / REPLACE / REMOVE PACEMAKER     Implantation-[employer]  . SKIN BIOPSY     face  . TOTAL ABDOMINAL HYSTERECTOMY W/ BILATERAL SALPINGOOPHORECTOMY      Current Outpatient Prescriptions  Medication Sig Dispense Refill  . acetaminophen (TYLENOL) 500 MG tablet Take 1,000 mg by mouth daily as needed for mild pain or headache.     . albuterol (PROVENTIL HFA;VENTOLIN HFA) 108 (90 BASE) MCG/ACT inhaler Inhale 2 puffs into the lungs every 6 (six) hours as needed for wheezing or shortness of breath.     Marland Kitchen amiodarone (PACERONE) 200 MG tablet Take 1 tablet (200 mg total) by mouth daily. 90 tablet 3  . apixaban  (ELIQUIS) 5 MG TABS tablet Take 1 tablet (5 mg total) by mouth 2 (two) times daily. 180 tablet 3  . b complex vitamins tablet Take 1 tablet by mouth daily.    . Carboxymethylcellul-Glycerin (LUBRICATING EYE DROPS OP) Place 2 drops into both eyes 2 (two) times daily.    . cefUROXime (CEFTIN) 250 MG tablet Take 250 mg by mouth daily as needed.     . clonazePAM (KLONOPIN) 0.5 MG tablet Take 0.5 mg by mouth 2 (two) times daily as needed for anxiety.    . cloNIDine (CATAPRES) 0.1 MG tablet Take 1 tablet (0.1 mg total) by mouth as needed. 60 tablet 6  . lactulose (CHRONULAC) 10 GM/15ML solution Take 30 g by mouth daily.     Marland Kitchen levothyroxine (SYNTHROID, LEVOTHROID) 50 MCG tablet Take 50 mcg by mouth daily.     Marland Kitchen losartan (COZAAR) 100 MG tablet Take 1 tablet (100 mg total) by mouth daily as needed. 30 tablet 6  . metoprolol (LOPRESSOR) 50 MG tablet Take 1 tablet (50 mg total) by mouth 2 (two) times daily. 180 tablet 3  . mometasone-formoterol (DULERA) 100-5 MCG/ACT AERO Inhale 2 puffs into the lungs 2 (two) times daily as needed.     . montelukast (SINGULAIR) 10 MG tablet Take 10 mg by mouth at bedtime.    . Multiple Vitamin (  MULTIVITAMIN) tablet Take 1 tablet by mouth daily.    . Multiple Vitamins-Minerals (PRESERVISION AREDS 2 PO) Take by mouth 2 (two) times daily.    . nitrofurantoin (MACRODANTIN) 100 MG capsule Take 100 mg by mouth 2 (two) times daily.    . nitroGLYCERIN (NITROSTAT) 0.4 MG SL tablet Place 1 tablet (0.4 mg total) under the tongue every 5 (five) minutes as needed for chest pain. 25 tablet 6  . potassium chloride (K-DUR,KLOR-CON) 10 MEQ tablet Take 1 tablet (10 mEq total) by mouth daily as needed. (Patient taking differently: Take 10 mEq by mouth daily. ) 90 tablet 3  . Probiotic Product (PROBIOTIC DAILY PO) Take 1 capsule by mouth daily.     . sertraline (ZOLOFT) 50 MG tablet Take 50 mg by mouth daily.    Marland Kitchen spironolactone (ALDACTONE) 25 MG tablet Take 25 mg by mouth every other day.      . torsemide (DEMADEX) 20 MG tablet Take 1 tablet (20 mg total) by mouth 2 (two) times daily as needed. 180 tablet 3  . Vitamin D, Ergocalciferol, (DRISDOL) 50000 units CAPS capsule Take 50,000 Units by mouth every 7 (seven) days.     No current facility-administered medications for this visit.      Allergies:   Ace inhibitors; Ambien [zolpidem tartrate]; Bactrim [sulfamethoxazole-trimethoprim]; Digoxin and related; Hctz [hydrochlorothiazide]; and Lunesta [eszopiclone]   Social History:  The patient  reports that she has never smoked. She has never used smokeless tobacco. She reports that she does not drink alcohol or use drugs.   Family History:   Family history is unknown by patient.    Review of Systems: Review of Systems  Constitutional: Negative.   Respiratory: Positive for shortness of breath.   Cardiovascular: Positive for leg swelling.  Gastrointestinal: Negative.   Musculoskeletal: Negative.   Neurological: Negative.   Psychiatric/Behavioral: Negative.   All other systems reviewed and are negative.    PHYSICAL EXAM: VS:  BP (!) 132/54 (BP Location: Left Arm, Patient Position: Sitting, Cuff Size: Normal)   Pulse 62   Ht 5\' 2"  (1.575 m)   Wt 146 lb 8 oz (66.5 kg)   BMI 26.80 kg/m  , BMI Body mass index is 26.8 kg/m. GEN: Well nourished, well developed, in no acute distress , Hard of hearing HEENT: normal  Neck: no JVD, carotid bruits, or masses Cardiac: RRR; no murmurs, rubs, or gallops, 1+ pitting edema  bilateral lower extremities to the mid shins Respiratory:  clear to auscultation bilaterally, normal work of breathing GI: soft, nontender, nondistended, + BS MS: no deformity or atrophy  Skin: warm and dry, no rash Neuro:  Strength and sensation are intact Psych: euthymic mood, full affect    Recent Labs: 01/16/2016: ALT 47; BUN 29; Hemoglobin 13.0; Platelets 220; Potassium 4.5; Sodium 136 07/03/2016: Creatinine, Ser 1.40    Lipid Panel No results found  for: CHOL, HDL, LDLCALC, TRIG    Wt Readings from Last 3 Encounters:  11/22/16 146 lb 8 oz (66.5 kg)  09/11/16 149 lb 8 oz (67.8 kg)  07/30/16 150 lb (68 kg)       ASSESSMENT AND PLAN:  Essential hypertension - Plan: EKG 12-Lead Blood pressure well controlled, no changes to her medications  Paroxysmal atrial fibrillation (HCC) - Plan: EKG 12-Lead Paced rhythm, tolerating anticoagulation On low-dose amiodarone  Chronotropic incompetence with sinus node dysfunction  (HCC) - Plan: EKG 12-Lead Atrial paced rhythm,  Managed by Dr. Caryl Comes  Chronic diastolic CHF (congestive heart failure) ([place]) -  Plan: EKG 12-Lead Lower extremity edema much worse and last clinic visit  on torsemide daily with Aldactone every other day Eating out more, normal renal function Baseline chronic anemia Recommended she increase torsemide up to 30 mg daily, try to cut back on her salt If no improvement in leg swelling any to go to torsemide 40 daily   Total encounter time more than 15 minutes  Greater than 50% was spent in counseling and coordination of care with the patient   Disposition:   F/U  6 weeks    No orders of the defined types were placed in this encounter.    Signed, Esmond Plants, M.D., Ph.D. 11/22/2016  [place], [place]

## 2016-11-22 ENCOUNTER — Encounter: Payer: Self-pay | Admitting: Cardiovascular Disease

## 2016-11-22 ENCOUNTER — Other Ambulatory Visit: Payer: Self-pay | Admitting: Cardiovascular Disease

## 2016-11-22 ENCOUNTER — Ambulatory Visit (INDEPENDENT_AMBULATORY_CARE_PROVIDER_SITE_OTHER): Payer: Medicare HMO | Admitting: Cardiovascular Disease

## 2016-11-22 VITALS — BP 132/54 | HR 62 | Ht 62.0 in | Wt 146.5 lb

## 2016-11-22 DIAGNOSIS — I480 Paroxysmal atrial fibrillation: Secondary | ICD-10-CM

## 2016-11-22 DIAGNOSIS — I495 Sick sinus syndrome: Secondary | ICD-10-CM

## 2016-11-22 DIAGNOSIS — Z950 Presence of cardiac pacemaker: Secondary | ICD-10-CM | POA: Diagnosis not present

## 2016-11-22 DIAGNOSIS — I10 Essential (primary) hypertension: Secondary | ICD-10-CM

## 2016-11-22 DIAGNOSIS — I5032 Chronic diastolic (congestive) heart failure: Secondary | ICD-10-CM | POA: Diagnosis not present

## 2016-11-22 MED ORDER — APIXABAN 5 MG PO TABS: 5.0000 mg | ORAL_TABLET | Freq: Two times a day (BID) | ORAL | 3 refills | Status: DC

## 2016-11-22 MED ORDER — APIXABAN 5 MG PO TABS
5.0000 mg | ORAL_TABLET | Freq: Two times a day (BID) | ORAL | 6 refills | Status: DC
Start: 2016-11-22 — End: 2016-11-22

## 2016-11-22 MED ORDER — TORSEMIDE 20 MG PO TABS: 20.0000 mg | ORAL_TABLET | Freq: Two times a day (BID) | ORAL | 3 refills | Status: DC | PRN

## 2016-11-22 MED ORDER — TORSEMIDE 20 MG PO TABS: 20.0000 mg | ORAL_TABLET | Freq: Two times a day (BID) | ORAL | 3 refills | Status: AC | PRN

## 2016-11-22 NOTE — Patient Instructions (Addendum)
Medication Instructions:   Increase the torsemide up to 1 1/2 pills a day If no improvement in the swelling, Increase up to two torsemide daily or alternate 1 1/2 with 2   Labwork:  No new labs needed  Testing/Procedures:  No further testing at this time   I recommend watching educational videos on topics of interest to you at:       [email or web address]  Enter code: [email or web address]    Follow-Up: It was a pleasure seeing you in the office today. Please call us if you have new issues that need to be addressed before your next appt.  [phone]  Your physician wants you to follow-up in: 6 weeks   If you need a refill on your cardiac medications before your next appointment, please call your pharmacy.

## 2016-11-24 DIAGNOSIS — I5030 Unspecified diastolic (congestive) heart failure: Secondary | ICD-10-CM | POA: Diagnosis not present

## 2016-11-24 DIAGNOSIS — I2510 Atherosclerotic heart disease of native coronary artery without angina pectoris: Secondary | ICD-10-CM | POA: Diagnosis not present

## 2016-11-24 DIAGNOSIS — I480 Paroxysmal atrial fibrillation: Secondary | ICD-10-CM | POA: Diagnosis not present

## 2016-11-24 DIAGNOSIS — F329 Major depressive disorder, single episode, unspecified: Secondary | ICD-10-CM | POA: Diagnosis not present

## 2016-11-24 DIAGNOSIS — I110 Hypertensive heart disease with heart failure: Secondary | ICD-10-CM | POA: Diagnosis not present

## 2016-11-24 DIAGNOSIS — K7460 Unspecified cirrhosis of liver: Secondary | ICD-10-CM | POA: Diagnosis not present

## 2016-11-24 DIAGNOSIS — M6281 Muscle weakness (generalized): Secondary | ICD-10-CM | POA: Diagnosis not present

## 2016-11-24 DIAGNOSIS — M810 Age-related osteoporosis without current pathological fracture: Secondary | ICD-10-CM | POA: Diagnosis not present

## 2016-11-24 DIAGNOSIS — D649 Anemia, unspecified: Secondary | ICD-10-CM | POA: Diagnosis not present

## 2016-11-26 NOTE — Addendum Note (Signed)
Addended by: Anselm Pancoast on: 11/26/2016 11:19 AM   Modules accepted: Orders

## 2016-11-28 DIAGNOSIS — M810 Age-related osteoporosis without current pathological fracture: Secondary | ICD-10-CM | POA: Diagnosis not present

## 2016-11-28 DIAGNOSIS — M6281 Muscle weakness (generalized): Secondary | ICD-10-CM | POA: Diagnosis not present

## 2016-11-28 DIAGNOSIS — I110 Hypertensive heart disease with heart failure: Secondary | ICD-10-CM | POA: Diagnosis not present

## 2016-11-28 DIAGNOSIS — I480 Paroxysmal atrial fibrillation: Secondary | ICD-10-CM | POA: Diagnosis not present

## 2016-11-28 DIAGNOSIS — I2510 Atherosclerotic heart disease of native coronary artery without angina pectoris: Secondary | ICD-10-CM | POA: Diagnosis not present

## 2016-11-28 DIAGNOSIS — K7460 Unspecified cirrhosis of liver: Secondary | ICD-10-CM | POA: Diagnosis not present

## 2016-11-28 DIAGNOSIS — I5030 Unspecified diastolic (congestive) heart failure: Secondary | ICD-10-CM | POA: Diagnosis not present

## 2016-11-28 DIAGNOSIS — N390 Urinary tract infection, site not specified: Secondary | ICD-10-CM | POA: Diagnosis not present

## 2016-11-28 DIAGNOSIS — F329 Major depressive disorder, single episode, unspecified: Secondary | ICD-10-CM | POA: Diagnosis not present

## 2016-11-30 DIAGNOSIS — M6281 Muscle weakness (generalized): Secondary | ICD-10-CM | POA: Diagnosis not present

## 2016-11-30 DIAGNOSIS — I5030 Unspecified diastolic (congestive) heart failure: Secondary | ICD-10-CM | POA: Diagnosis not present

## 2016-11-30 DIAGNOSIS — I480 Paroxysmal atrial fibrillation: Secondary | ICD-10-CM | POA: Diagnosis not present

## 2016-11-30 DIAGNOSIS — N390 Urinary tract infection, site not specified: Secondary | ICD-10-CM | POA: Diagnosis not present

## 2016-11-30 DIAGNOSIS — K7460 Unspecified cirrhosis of liver: Secondary | ICD-10-CM | POA: Diagnosis not present

## 2016-11-30 DIAGNOSIS — M810 Age-related osteoporosis without current pathological fracture: Secondary | ICD-10-CM | POA: Diagnosis not present

## 2016-11-30 DIAGNOSIS — F329 Major depressive disorder, single episode, unspecified: Secondary | ICD-10-CM | POA: Diagnosis not present

## 2016-11-30 DIAGNOSIS — I2510 Atherosclerotic heart disease of native coronary artery without angina pectoris: Secondary | ICD-10-CM | POA: Diagnosis not present

## 2016-11-30 DIAGNOSIS — I110 Hypertensive heart disease with heart failure: Secondary | ICD-10-CM | POA: Diagnosis not present

## 2016-12-03 DIAGNOSIS — H353231 Exudative age-related macular degeneration, bilateral, with active choroidal neovascularization: Secondary | ICD-10-CM | POA: Diagnosis not present

## 2016-12-03 DIAGNOSIS — H353221 Exudative age-related macular degeneration, left eye, with active choroidal neovascularization: Secondary | ICD-10-CM | POA: Diagnosis not present

## 2016-12-03 DIAGNOSIS — H353211 Exudative age-related macular degeneration, right eye, with active choroidal neovascularization: Secondary | ICD-10-CM | POA: Diagnosis not present

## 2016-12-04 DIAGNOSIS — M6281 Muscle weakness (generalized): Secondary | ICD-10-CM | POA: Diagnosis not present

## 2016-12-04 DIAGNOSIS — I110 Hypertensive heart disease with heart failure: Secondary | ICD-10-CM | POA: Diagnosis not present

## 2016-12-04 DIAGNOSIS — I480 Paroxysmal atrial fibrillation: Secondary | ICD-10-CM | POA: Diagnosis not present

## 2016-12-04 DIAGNOSIS — I5030 Unspecified diastolic (congestive) heart failure: Secondary | ICD-10-CM | POA: Diagnosis not present

## 2016-12-04 DIAGNOSIS — I2510 Atherosclerotic heart disease of native coronary artery without angina pectoris: Secondary | ICD-10-CM | POA: Diagnosis not present

## 2016-12-04 DIAGNOSIS — F329 Major depressive disorder, single episode, unspecified: Secondary | ICD-10-CM | POA: Diagnosis not present

## 2016-12-04 DIAGNOSIS — N390 Urinary tract infection, site not specified: Secondary | ICD-10-CM | POA: Diagnosis not present

## 2016-12-04 DIAGNOSIS — M810 Age-related osteoporosis without current pathological fracture: Secondary | ICD-10-CM | POA: Diagnosis not present

## 2016-12-04 DIAGNOSIS — K7460 Unspecified cirrhosis of liver: Secondary | ICD-10-CM | POA: Diagnosis not present

## 2016-12-06 DIAGNOSIS — I110 Hypertensive heart disease with heart failure: Secondary | ICD-10-CM | POA: Diagnosis not present

## 2016-12-06 DIAGNOSIS — K7460 Unspecified cirrhosis of liver: Secondary | ICD-10-CM | POA: Diagnosis not present

## 2016-12-06 DIAGNOSIS — I2510 Atherosclerotic heart disease of native coronary artery without angina pectoris: Secondary | ICD-10-CM | POA: Diagnosis not present

## 2016-12-06 DIAGNOSIS — M810 Age-related osteoporosis without current pathological fracture: Secondary | ICD-10-CM | POA: Diagnosis not present

## 2016-12-06 DIAGNOSIS — M6281 Muscle weakness (generalized): Secondary | ICD-10-CM | POA: Diagnosis not present

## 2016-12-06 DIAGNOSIS — I5030 Unspecified diastolic (congestive) heart failure: Secondary | ICD-10-CM | POA: Diagnosis not present

## 2016-12-06 DIAGNOSIS — N390 Urinary tract infection, site not specified: Secondary | ICD-10-CM | POA: Diagnosis not present

## 2016-12-06 DIAGNOSIS — I480 Paroxysmal atrial fibrillation: Secondary | ICD-10-CM | POA: Diagnosis not present

## 2016-12-06 DIAGNOSIS — F329 Major depressive disorder, single episode, unspecified: Secondary | ICD-10-CM | POA: Diagnosis not present

## 2016-12-11 DIAGNOSIS — N390 Urinary tract infection, site not specified: Secondary | ICD-10-CM | POA: Diagnosis not present

## 2016-12-11 DIAGNOSIS — M6281 Muscle weakness (generalized): Secondary | ICD-10-CM | POA: Diagnosis not present

## 2016-12-11 DIAGNOSIS — I2510 Atherosclerotic heart disease of native coronary artery without angina pectoris: Secondary | ICD-10-CM | POA: Diagnosis not present

## 2016-12-11 DIAGNOSIS — I5030 Unspecified diastolic (congestive) heart failure: Secondary | ICD-10-CM | POA: Diagnosis not present

## 2016-12-11 DIAGNOSIS — I110 Hypertensive heart disease with heart failure: Secondary | ICD-10-CM | POA: Diagnosis not present

## 2016-12-11 DIAGNOSIS — F329 Major depressive disorder, single episode, unspecified: Secondary | ICD-10-CM | POA: Diagnosis not present

## 2016-12-11 DIAGNOSIS — K7460 Unspecified cirrhosis of liver: Secondary | ICD-10-CM | POA: Diagnosis not present

## 2016-12-11 DIAGNOSIS — M810 Age-related osteoporosis without current pathological fracture: Secondary | ICD-10-CM | POA: Diagnosis not present

## 2016-12-11 DIAGNOSIS — I480 Paroxysmal atrial fibrillation: Secondary | ICD-10-CM | POA: Diagnosis not present

## 2016-12-13 DIAGNOSIS — K7460 Unspecified cirrhosis of liver: Secondary | ICD-10-CM | POA: Diagnosis not present

## 2016-12-13 DIAGNOSIS — R0602 Shortness of breath: Secondary | ICD-10-CM | POA: Diagnosis not present

## 2016-12-13 DIAGNOSIS — R29898 Other symptoms and signs involving the musculoskeletal system: Secondary | ICD-10-CM | POA: Diagnosis not present

## 2016-12-13 DIAGNOSIS — M810 Age-related osteoporosis without current pathological fracture: Secondary | ICD-10-CM | POA: Diagnosis not present

## 2016-12-13 DIAGNOSIS — F419 Anxiety disorder, unspecified: Secondary | ICD-10-CM | POA: Diagnosis not present

## 2016-12-13 DIAGNOSIS — F329 Major depressive disorder, single episode, unspecified: Secondary | ICD-10-CM | POA: Diagnosis not present

## 2016-12-13 DIAGNOSIS — I10 Essential (primary) hypertension: Secondary | ICD-10-CM | POA: Diagnosis not present

## 2016-12-13 DIAGNOSIS — R5381 Other malaise: Secondary | ICD-10-CM | POA: Diagnosis not present

## 2016-12-13 DIAGNOSIS — I110 Hypertensive heart disease with heart failure: Secondary | ICD-10-CM | POA: Diagnosis not present

## 2016-12-13 DIAGNOSIS — I480 Paroxysmal atrial fibrillation: Secondary | ICD-10-CM | POA: Diagnosis not present

## 2016-12-13 DIAGNOSIS — N390 Urinary tract infection, site not specified: Secondary | ICD-10-CM | POA: Diagnosis not present

## 2016-12-13 DIAGNOSIS — M6281 Muscle weakness (generalized): Secondary | ICD-10-CM | POA: Diagnosis not present

## 2016-12-13 DIAGNOSIS — D649 Anemia, unspecified: Secondary | ICD-10-CM | POA: Diagnosis not present

## 2016-12-13 DIAGNOSIS — E079 Disorder of thyroid, unspecified: Secondary | ICD-10-CM | POA: Diagnosis not present

## 2016-12-13 DIAGNOSIS — I2510 Atherosclerotic heart disease of native coronary artery without angina pectoris: Secondary | ICD-10-CM | POA: Diagnosis not present

## 2016-12-13 DIAGNOSIS — I5030 Unspecified diastolic (congestive) heart failure: Secondary | ICD-10-CM | POA: Diagnosis not present

## 2016-12-13 DIAGNOSIS — E785 Hyperlipidemia, unspecified: Secondary | ICD-10-CM | POA: Diagnosis not present

## 2016-12-13 DIAGNOSIS — I5032 Chronic diastolic (congestive) heart failure: Secondary | ICD-10-CM | POA: Diagnosis not present

## 2016-12-13 DIAGNOSIS — R5383 Other fatigue: Secondary | ICD-10-CM | POA: Diagnosis not present

## 2016-12-17 DIAGNOSIS — I2510 Atherosclerotic heart disease of native coronary artery without angina pectoris: Secondary | ICD-10-CM | POA: Diagnosis not present

## 2016-12-17 DIAGNOSIS — F329 Major depressive disorder, single episode, unspecified: Secondary | ICD-10-CM | POA: Diagnosis not present

## 2016-12-17 DIAGNOSIS — M6281 Muscle weakness (generalized): Secondary | ICD-10-CM | POA: Diagnosis not present

## 2016-12-17 DIAGNOSIS — I5030 Unspecified diastolic (congestive) heart failure: Secondary | ICD-10-CM | POA: Diagnosis not present

## 2016-12-17 DIAGNOSIS — K7460 Unspecified cirrhosis of liver: Secondary | ICD-10-CM | POA: Diagnosis not present

## 2016-12-17 DIAGNOSIS — M810 Age-related osteoporosis without current pathological fracture: Secondary | ICD-10-CM | POA: Diagnosis not present

## 2016-12-17 DIAGNOSIS — N390 Urinary tract infection, site not specified: Secondary | ICD-10-CM | POA: Diagnosis not present

## 2016-12-17 DIAGNOSIS — I480 Paroxysmal atrial fibrillation: Secondary | ICD-10-CM | POA: Diagnosis not present

## 2016-12-17 DIAGNOSIS — I110 Hypertensive heart disease with heart failure: Secondary | ICD-10-CM | POA: Diagnosis not present

## 2016-12-18 DIAGNOSIS — I480 Paroxysmal atrial fibrillation: Secondary | ICD-10-CM | POA: Diagnosis not present

## 2016-12-18 DIAGNOSIS — I110 Hypertensive heart disease with heart failure: Secondary | ICD-10-CM | POA: Diagnosis not present

## 2016-12-18 DIAGNOSIS — M6281 Muscle weakness (generalized): Secondary | ICD-10-CM | POA: Diagnosis not present

## 2016-12-18 DIAGNOSIS — N390 Urinary tract infection, site not specified: Secondary | ICD-10-CM | POA: Diagnosis not present

## 2016-12-19 DIAGNOSIS — K7460 Unspecified cirrhosis of liver: Secondary | ICD-10-CM | POA: Diagnosis not present

## 2016-12-19 DIAGNOSIS — N390 Urinary tract infection, site not specified: Secondary | ICD-10-CM | POA: Diagnosis not present

## 2016-12-19 DIAGNOSIS — I480 Paroxysmal atrial fibrillation: Secondary | ICD-10-CM | POA: Diagnosis not present

## 2016-12-19 DIAGNOSIS — I2510 Atherosclerotic heart disease of native coronary artery without angina pectoris: Secondary | ICD-10-CM | POA: Diagnosis not present

## 2016-12-19 DIAGNOSIS — M6281 Muscle weakness (generalized): Secondary | ICD-10-CM | POA: Diagnosis not present

## 2016-12-19 DIAGNOSIS — F329 Major depressive disorder, single episode, unspecified: Secondary | ICD-10-CM | POA: Diagnosis not present

## 2016-12-19 DIAGNOSIS — I5030 Unspecified diastolic (congestive) heart failure: Secondary | ICD-10-CM | POA: Diagnosis not present

## 2016-12-19 DIAGNOSIS — M810 Age-related osteoporosis without current pathological fracture: Secondary | ICD-10-CM | POA: Diagnosis not present

## 2016-12-19 DIAGNOSIS — I110 Hypertensive heart disease with heart failure: Secondary | ICD-10-CM | POA: Diagnosis not present

## 2016-12-27 DIAGNOSIS — I480 Paroxysmal atrial fibrillation: Secondary | ICD-10-CM | POA: Diagnosis not present

## 2016-12-27 DIAGNOSIS — I5030 Unspecified diastolic (congestive) heart failure: Secondary | ICD-10-CM | POA: Diagnosis not present

## 2016-12-27 DIAGNOSIS — I2510 Atherosclerotic heart disease of native coronary artery without angina pectoris: Secondary | ICD-10-CM | POA: Diagnosis not present

## 2016-12-27 DIAGNOSIS — F329 Major depressive disorder, single episode, unspecified: Secondary | ICD-10-CM | POA: Diagnosis not present

## 2016-12-27 DIAGNOSIS — K7460 Unspecified cirrhosis of liver: Secondary | ICD-10-CM | POA: Diagnosis not present

## 2016-12-27 DIAGNOSIS — N390 Urinary tract infection, site not specified: Secondary | ICD-10-CM | POA: Diagnosis not present

## 2016-12-27 DIAGNOSIS — M6281 Muscle weakness (generalized): Secondary | ICD-10-CM | POA: Diagnosis not present

## 2016-12-27 DIAGNOSIS — I110 Hypertensive heart disease with heart failure: Secondary | ICD-10-CM | POA: Diagnosis not present

## 2016-12-27 DIAGNOSIS — M810 Age-related osteoporosis without current pathological fracture: Secondary | ICD-10-CM | POA: Diagnosis not present

## 2016-12-28 ENCOUNTER — Telehealth: Payer: Self-pay | Admitting: Cardiovascular Disease

## 2016-12-28 NOTE — Telephone Encounter (Signed)
Pt daughter states pt passed some dark congealed blood this morning, and her PCP suggest she hold her Eliquis. Please call.

## 2016-12-28 NOTE — Telephone Encounter (Signed)
She reports that her mother had some congealed blood this morning. She had already taken her morning laxative and reports that it was on her pad. After that laxative started working they did not see anymore blood. She reports that she takes iron daily and that already makes her stools dark and that she has not seen anymore of that congealed blood. She states that she has not had any shortness of breath, no abdominal pain, and blood pressures have been good. Readings are 145/63, 114/52, 122/57, 108/57. They called Dr.  Linton Ham office and made them aware and they entered GI referral and suggested to check with [place] on holding the Eliquis. Reviewed symptoms to monitor for with active bleeding, bright red blood, or drop in blood pressure to seek evaluation in the ED. Instructed her to hold until I can discuss with Dr. Rockey Situ and I would give her a call back. She was very appreciative for the call back and had no further questions at this time.

## 2016-12-28 NOTE — Telephone Encounter (Signed)
Reviewed with patients daughter Dr. Donivan Scull recommendations to hold eliquis over the weekend and then give [place] a call back Monday to let us know if she had any additional bleeding. She verbalized understanding and had no further questions at this time.

## 2016-12-28 NOTE — Telephone Encounter (Signed)
I think I would hold the blood thinner and we should probably monitor over the weekend Perhaps they can call us back on Monday or Tuesday and let us know if there is any more blood We may need a period of time off the blood thinners like a week

## 2017-01-01 DIAGNOSIS — K625 Hemorrhage of anus and rectum: Secondary | ICD-10-CM | POA: Diagnosis not present

## 2017-01-01 DIAGNOSIS — D62 Acute posthemorrhagic anemia: Secondary | ICD-10-CM | POA: Diagnosis not present

## 2017-01-02 ENCOUNTER — Encounter (INDEPENDENT_AMBULATORY_CARE_PROVIDER_SITE_OTHER): Payer: Self-pay

## 2017-01-02 ENCOUNTER — Inpatient Hospital Stay: Payer: Medicare HMO

## 2017-01-02 ENCOUNTER — Ambulatory Visit
Admission: RE | Admit: 2017-01-02 | Discharge: 2017-01-02 | Disposition: A | Discharge status: Home / Self Care | Payer: Medicare HMO | Source: Ambulatory Visit | Attending: Nurse Practitioner | Admitting: Nurse Practitioner

## 2017-01-02 ENCOUNTER — Inpatient Hospital Stay
Admission: AD | Admit: 2017-01-02 | Discharge: 2017-01-04 | DRG: 377 | Disposition: A | Discharge status: Home Health | Payer: Medicare HMO | Source: Ambulatory Visit | Attending: Internal Medicine | Admitting: Internal Medicine

## 2017-01-02 ENCOUNTER — Inpatient Hospital Stay
Admission: AD | Admit: 2017-01-02 | Discharge: 2017-01-02 | Disposition: A | Discharge status: Home / Self Care | Payer: Medicare HMO | Source: Ambulatory Visit | Attending: Internal Medicine | Admitting: Internal Medicine

## 2017-01-02 ENCOUNTER — Encounter: Payer: Self-pay | Admitting: Oncology

## 2017-01-02 ENCOUNTER — Other Ambulatory Visit: Payer: Self-pay | Admitting: Nurse Practitioner

## 2017-01-02 ENCOUNTER — Encounter: Payer: Self-pay | Admitting: *Deleted

## 2017-01-02 ENCOUNTER — Inpatient Hospital Stay: Payer: Medicare HMO | Attending: Oncology | Admitting: Oncology

## 2017-01-02 VITALS — BP 133/75 | HR 60 | Temp 97.5°F | Resp 20 | Wt 146.6 lb

## 2017-01-02 DIAGNOSIS — I509 Heart failure, unspecified: Secondary | ICD-10-CM | POA: Diagnosis not present

## 2017-01-02 DIAGNOSIS — R630 Anorexia: Secondary | ICD-10-CM

## 2017-01-02 DIAGNOSIS — Z9181 History of falling: Secondary | ICD-10-CM | POA: Diagnosis not present

## 2017-01-02 DIAGNOSIS — Z881 Allergy status to other antibiotic agents status: Secondary | ICD-10-CM

## 2017-01-02 DIAGNOSIS — Z923 Personal history of irradiation: Secondary | ICD-10-CM | POA: Diagnosis not present

## 2017-01-02 DIAGNOSIS — I959 Hypotension, unspecified: Secondary | ICD-10-CM | POA: Diagnosis present

## 2017-01-02 DIAGNOSIS — I10 Essential (primary) hypertension: Secondary | ICD-10-CM | POA: Diagnosis not present

## 2017-01-02 DIAGNOSIS — Z950 Presence of cardiac pacemaker: Secondary | ICD-10-CM | POA: Diagnosis not present

## 2017-01-02 DIAGNOSIS — Z7189 Other specified counseling: Secondary | ICD-10-CM | POA: Diagnosis not present

## 2017-01-02 DIAGNOSIS — Z515 Encounter for palliative care: Secondary | ICD-10-CM | POA: Diagnosis not present

## 2017-01-02 DIAGNOSIS — R0600 Dyspnea, unspecified: Secondary | ICD-10-CM | POA: Diagnosis not present

## 2017-01-02 DIAGNOSIS — Z85820 Personal history of malignant melanoma of skin: Secondary | ICD-10-CM

## 2017-01-02 DIAGNOSIS — C55 Malignant neoplasm of uterus, part unspecified: Secondary | ICD-10-CM

## 2017-01-02 DIAGNOSIS — G4730 Sleep apnea, unspecified: Secondary | ICD-10-CM

## 2017-01-02 DIAGNOSIS — Z888 Allergy status to other drugs, medicaments and biological substances status: Secondary | ICD-10-CM

## 2017-01-02 DIAGNOSIS — Z9049 Acquired absence of other specified parts of digestive tract: Secondary | ICD-10-CM | POA: Diagnosis not present

## 2017-01-02 DIAGNOSIS — Z8719 Personal history of other diseases of the digestive system: Secondary | ICD-10-CM

## 2017-01-02 DIAGNOSIS — K922 Gastrointestinal hemorrhage, unspecified: Principal | ICD-10-CM | POA: Diagnosis present

## 2017-01-02 DIAGNOSIS — K6289 Other specified diseases of anus and rectum: Secondary | ICD-10-CM | POA: Diagnosis not present

## 2017-01-02 DIAGNOSIS — Z90710 Acquired absence of both cervix and uterus: Secondary | ICD-10-CM

## 2017-01-02 DIAGNOSIS — Z79899 Other long term (current) drug therapy: Secondary | ICD-10-CM | POA: Diagnosis not present

## 2017-01-02 DIAGNOSIS — G4733 Obstructive sleep apnea (adult) (pediatric): Secondary | ICD-10-CM | POA: Diagnosis not present

## 2017-01-02 DIAGNOSIS — Z8542 Personal history of malignant neoplasm of other parts of uterus: Secondary | ICD-10-CM

## 2017-01-02 DIAGNOSIS — I4891 Unspecified atrial fibrillation: Secondary | ICD-10-CM

## 2017-01-02 DIAGNOSIS — R609 Edema, unspecified: Secondary | ICD-10-CM

## 2017-01-02 DIAGNOSIS — D649 Anemia, unspecified: Secondary | ICD-10-CM

## 2017-01-02 DIAGNOSIS — I110 Hypertensive heart disease with heart failure: Secondary | ICD-10-CM | POA: Diagnosis not present

## 2017-01-02 DIAGNOSIS — H9190 Unspecified hearing loss, unspecified ear: Secondary | ICD-10-CM | POA: Diagnosis present

## 2017-01-02 DIAGNOSIS — F0390 Unspecified dementia without behavioral disturbance: Secondary | ICD-10-CM | POA: Diagnosis present

## 2017-01-02 DIAGNOSIS — M7989 Other specified soft tissue disorders: Secondary | ICD-10-CM

## 2017-01-02 DIAGNOSIS — Z7901 Long term (current) use of anticoagulants: Secondary | ICD-10-CM | POA: Diagnosis not present

## 2017-01-02 DIAGNOSIS — R0602 Shortness of breath: Secondary | ICD-10-CM | POA: Diagnosis not present

## 2017-01-02 DIAGNOSIS — D62 Acute posthemorrhagic anemia: Secondary | ICD-10-CM | POA: Diagnosis not present

## 2017-01-02 DIAGNOSIS — R109 Unspecified abdominal pain: Secondary | ICD-10-CM

## 2017-01-02 DIAGNOSIS — E039 Hypothyroidism, unspecified: Secondary | ICD-10-CM | POA: Diagnosis not present

## 2017-01-02 DIAGNOSIS — K219 Gastro-esophageal reflux disease without esophagitis: Secondary | ICD-10-CM | POA: Diagnosis present

## 2017-01-02 DIAGNOSIS — C541 Malignant neoplasm of endometrium: Secondary | ICD-10-CM | POA: Diagnosis not present

## 2017-01-02 DIAGNOSIS — K625 Hemorrhage of anus and rectum: Secondary | ICD-10-CM

## 2017-01-02 DIAGNOSIS — N898 Other specified noninflammatory disorders of vagina: Secondary | ICD-10-CM

## 2017-01-02 DIAGNOSIS — M8580 Other specified disorders of bone density and structure, unspecified site: Secondary | ICD-10-CM

## 2017-01-02 DIAGNOSIS — R531 Weakness: Secondary | ICD-10-CM

## 2017-01-02 DIAGNOSIS — R600 Localized edema: Secondary | ICD-10-CM | POA: Diagnosis not present

## 2017-01-02 DIAGNOSIS — N899 Noninflammatory disorder of vagina, unspecified: Secondary | ICD-10-CM | POA: Diagnosis not present

## 2017-01-02 DIAGNOSIS — I5033 Acute on chronic diastolic (congestive) heart failure: Secondary | ICD-10-CM | POA: Diagnosis not present

## 2017-01-02 DIAGNOSIS — K7460 Unspecified cirrhosis of liver: Secondary | ICD-10-CM | POA: Insufficient documentation

## 2017-01-02 DIAGNOSIS — Z933 Colostomy status: Secondary | ICD-10-CM

## 2017-01-02 DIAGNOSIS — R5383 Other fatigue: Secondary | ICD-10-CM | POA: Insufficient documentation

## 2017-01-02 LAB — CBC WITH DIFFERENTIAL/PLATELET
BASOS PCT: 1 %
Basophils Absolute: 0.1 10*3/uL (ref 0–0.1)
EOS ABS: 0.3 10*3/uL (ref 0–0.7)
Eosinophils Relative: 3 %
HEMATOCRIT: 23.9 % — AB (ref 35.0–47.0)
HEMOGLOBIN: 8 g/dL — AB (ref 12.0–16.0)
Lymphocytes Relative: 20 %
Lymphs Abs: 2.1 10*3/uL (ref 1.0–3.6)
MCH: 28.7 pg (ref 26.0–34.0)
MCHC: 33.4 g/dL (ref 32.0–36.0)
MCV: 86 fL (ref 80.0–100.0)
MONOS PCT: 5 %
Monocytes Absolute: 0.6 10*3/uL (ref 0.2–0.9)
NEUTROS ABS: 7.3 10*3/uL — AB (ref 1.4–6.5)
NEUTROS PCT: 71 %
Platelets: 298 10*3/uL (ref 150–440)
RBC: 2.78 MIL/uL — AB (ref 3.80–5.20)
RDW: 15.7 % — ABNORMAL HIGH (ref 11.5–14.5)
WBC: 10.4 10*3/uL (ref 3.6–11.0)

## 2017-01-02 LAB — BASIC METABOLIC PANEL
ANION GAP: 7 (ref 5–15)
BUN: 16 mg/dL (ref 6–20)
CHLORIDE: 99 mmol/L — AB (ref 101–111)
CO2: 26 mmol/L (ref 22–32)
Calcium: 8.4 mg/dL — ABNORMAL LOW (ref 8.9–10.3)
Creatinine, Ser: 1.02 mg/dL — ABNORMAL HIGH (ref 0.44–1.00)
GFR calc non Af Amer: 46 mL/min — ABNORMAL LOW (ref >60)
GFR, EST AFRICAN AMERICAN: 53 mL/min — AB (ref >60)
Glucose, Bld: 108 mg/dL — ABNORMAL HIGH (ref 65–99)
POTASSIUM: 4.3 mmol/L (ref 3.5–5.1)
SODIUM: 132 mmol/L — AB (ref 135–145)

## 2017-01-02 LAB — IRON AND TIBC
IRON: 12 ug/dL — AB (ref 28–170)
SATURATION RATIOS: 3 % — AB (ref 10.4–31.8)
TIBC: 393 ug/dL (ref 250–450)
UIBC: 381 ug/dL

## 2017-01-02 LAB — HEPATIC FUNCTION PANEL
ALBUMIN: 3.1 g/dL — AB (ref 3.5–5.0)
ALT: 48 U/L (ref 14–54)
AST: 75 U/L — AB (ref 15–41)
Alkaline Phosphatase: 90 U/L (ref 38–126)
BILIRUBIN TOTAL: 0.5 mg/dL (ref 0.3–1.2)
Bilirubin, Direct: <0.1 mg/dL — ABNORMAL LOW (ref 0.1–0.5)
TOTAL PROTEIN: 6.5 g/dL (ref 6.5–8.1)

## 2017-01-02 LAB — TSH: TSH: 9.127 u[IU]/mL — ABNORMAL HIGH (ref 0.350–4.500)

## 2017-01-02 LAB — TROPONIN I: Troponin I: <0.03 ng/mL (ref <0.03)

## 2017-01-02 LAB — LACTATE DEHYDROGENASE: LDH: 157 U/L (ref 98–192)

## 2017-01-02 LAB — APTT: aPTT: 32 seconds (ref 24–36)

## 2017-01-02 LAB — FOLATE: Folate: 56.4 ng/mL (ref >5.9)

## 2017-01-02 LAB — RETICULOCYTES
RBC.: 2.85 MIL/uL — ABNORMAL LOW (ref 3.80–5.20)
RETIC CT PCT: 3.2 % — AB (ref 0.4–3.1)
Retic Count, Absolute: 91.2 10*3/uL (ref 19.0–183.0)

## 2017-01-02 LAB — FERRITIN: FERRITIN: 28 ng/mL (ref 11–307)

## 2017-01-02 LAB — PROTIME-INR
INR: 1.13
Prothrombin Time: 14.6 seconds (ref 11.4–15.2)

## 2017-01-02 LAB — VITAMIN B12: VITAMIN B 12: 1260 pg/mL — AB (ref 180–914)

## 2017-01-02 MED ORDER — LEVOTHYROXINE SODIUM 50 MCG PO TABS: 50.0000 ug | ORAL_TABLET | Freq: Every day | ORAL | Status: DC

## 2017-01-02 MED ORDER — SERTRALINE HCL 50 MG PO TABS
50.0000 mg | ORAL_TABLET | Freq: Every day | ORAL | Status: DC
  Administered 2017-01-02: 50 mg via ORAL
  Filled 2017-01-02 (×2): qty 1

## 2017-01-02 MED ORDER — MOMETASONE FURO-FORMOTEROL FUM 100-5 MCG/ACT IN AERO
2.0000 | INHALATION_SPRAY | Freq: Two times a day (BID) | RESPIRATORY_TRACT | Status: DC
  Administered 2017-01-03 – 2017-01-04 (×4): 2 via RESPIRATORY_TRACT
  Filled 2017-01-02 (×2): qty 8.8

## 2017-01-02 MED ORDER — OCUVITE-LUTEIN PO CAPS
2.0000 | ORAL_CAPSULE | Freq: Two times a day (BID) | ORAL | Status: DC
  Administered 2017-01-03 – 2017-01-04 (×3): 2 via ORAL
  Filled 2017-01-02 (×5): qty 2

## 2017-01-02 MED ORDER — LEVOTHYROXINE SODIUM 50 MCG PO TABS: 75.0000 ug | ORAL_TABLET | ORAL | Status: DC

## 2017-01-02 MED ORDER — LEVOTHYROXINE SODIUM 50 MCG PO TABS
50.0000 ug | ORAL_TABLET | ORAL | Status: DC
  Administered 2017-01-03 – 2017-01-04 (×2): 50 ug via ORAL
  Filled 2017-01-02 (×2): qty 1

## 2017-01-02 MED ORDER — MONTELUKAST SODIUM 10 MG PO TABS
10.0000 mg | ORAL_TABLET | Freq: Every day | ORAL | Status: DC
  Administered 2017-01-02 – 2017-01-03 (×2): 10 mg via ORAL
  Filled 2017-01-02 (×2): qty 1

## 2017-01-02 MED ORDER — SODIUM CHLORIDE 0.9% FLUSH
3.0000 mL | Freq: Two times a day (BID) | INTRAVENOUS | Status: DC
  Administered 2017-01-02 – 2017-01-04 (×4): 3 mL via INTRAVENOUS

## 2017-01-02 MED ORDER — ALBUTEROL SULFATE (2.5 MG/3ML) 0.083% IN NEBU: 2.5000 mg | INHALATION_SOLUTION | Freq: Four times a day (QID) | RESPIRATORY_TRACT | Status: DC | PRN

## 2017-01-02 MED ORDER — RISAQUAD PO CAPS
1.0000 | ORAL_CAPSULE | Freq: Every day | ORAL | Status: DC
  Administered 2017-01-03 – 2017-01-04 (×2): 1 via ORAL
  Filled 2017-01-02 (×2): qty 1

## 2017-01-02 MED ORDER — CLONAZEPAM 0.5 MG PO TABS
0.5000 mg | ORAL_TABLET | Freq: Two times a day (BID) | ORAL | Status: DC | PRN
  Administered 2017-01-03: 0.5 mg via ORAL
  Filled 2017-01-02: qty 1

## 2017-01-02 MED ORDER — SODIUM CHLORIDE 0.9% FLUSH: 3.0000 mL | INTRAVENOUS | Status: DC | PRN

## 2017-01-02 MED ORDER — SODIUM CHLORIDE 0.9 % IV SOLN: 250.0000 mL | INTRAVENOUS | Status: DC | PRN

## 2017-01-02 MED ORDER — PANTOPRAZOLE SODIUM 40 MG PO TBEC
40.0000 mg | DELAYED_RELEASE_TABLET | Freq: Two times a day (BID) | ORAL | Status: DC
  Administered 2017-01-02 – 2017-01-04 (×4): 40 mg via ORAL
  Filled 2017-01-02 (×4): qty 1

## 2017-01-02 MED ORDER — FUROSEMIDE 10 MG/ML IJ SOLN
40.0000 mg | Freq: Once | INTRAMUSCULAR | Status: AC
  Administered 2017-01-02: 23:00:00 40 mg via INTRAVENOUS
  Filled 2017-01-02: qty 4

## 2017-01-02 MED ORDER — PANTOPRAZOLE SODIUM 40 MG PO TBEC: 40.0000 mg | DELAYED_RELEASE_TABLET | Freq: Every day | ORAL | Status: DC

## 2017-01-02 MED ORDER — POLYVINYL ALCOHOL 1.4 % OP SOLN
1.0000 [drp] | Freq: Two times a day (BID) | OPHTHALMIC | Status: DC
  Administered 2017-01-03 – 2017-01-04 (×4): 1 [drp] via OPHTHALMIC
  Filled 2017-01-02 (×2): qty 15

## 2017-01-02 MED ORDER — ONDANSETRON HCL 4 MG PO TABS: 4.0000 mg | ORAL_TABLET | Freq: Four times a day (QID) | ORAL | Status: DC | PRN

## 2017-01-02 MED ORDER — ONDANSETRON HCL 4 MG/2ML IJ SOLN: 4.0000 mg | Freq: Four times a day (QID) | INTRAMUSCULAR | Status: DC | PRN

## 2017-01-02 MED ORDER — AMIODARONE HCL 200 MG PO TABS
200.0000 mg | ORAL_TABLET | Freq: Every day | ORAL | Status: DC
  Administered 2017-01-03 – 2017-01-04 (×2): 200 mg via ORAL
  Filled 2017-01-02 (×2): qty 1

## 2017-01-02 NOTE — H&P (Signed)
[facility] NAME: Kathryn Short    MR#:  [id number]  DATE OF BIRTH:  [birth year]  DATE OF ADMISSION:  01/02/2017  PRIMARY CARE PHYSICIAN: Tracie Harrier, MD   REQUESTING/REFERRING PHYSICIAN:   CHIEF COMPLAINT:  No chief complaint on file.   HISTORY OF PRESENT ILLNESS: Kathryn Short  is a 83 y.o. female with a known history of atrial fibrillation, bowel obstruction in the past, CHF, essential hypertension, hypothyroidism, uterine cancer, who has known soft tissue mass and vaginal area since January 2018, visible on CT scan at that time, also liver cirrhosis, chronic iron deficiency anemia, who presents to the hospital after she was noted to be more anemic, complained of shortness of breath. Apparently, patient was noted to have dark blood clots on the pad on Friday, 12/29/2016, she was noted to be weaker. She was given lactulose to produce bowel movements, when she was noted to have bright red blood in stool. She was seen by gastroenterologist and was ordered. CT scan today, results of which are pending. Her hemoglobin level was checked today and it was found to be 8.0, which is about 2 g, down from about 3 weeks ago. She was seen by her oncologist today and recommended direct admission. Since patient is weaker and more short of breath than usual. Patient's daughter is complaining of patient having gastrointestinal  bleed, lower extremity swelling, seems to be worsening over the period of weeks, shortness of breath, orthopnea, dyspnea on exertion. Patient herself is very hard of hearing, she complains of lower abdominal pain during palpation. The patient apparently recently finished antibiotic therapy for urinary tract infection, no diarrheal stools were noted, except of his lactulose  PAST MEDICAL HISTORY:   Past Medical History:  Diagnosis Date  . Atrial fibrillation ([place])   . Bowel obstruction ([place])   . CHF (congestive heart  failure) ([place])   . Fall   . GERD (gastroesophageal reflux disease)   . Hypertension   . Hypothyroidism    Treated  . Melanoma ([place])    Resected from back  . OSA (obstructive sleep apnea)    On BiPAP  . Uterine cancer [facility])    s/p radiation therapy in 1952    PAST SURGICAL HISTORY: Past Surgical History:  Procedure Laterality Date  . CHOLECYSTECTOMY    . COLOSTOMY     For perforation  . Cytoscopy    . EP IMPLANTABLE DEVICE N/A 09/07/2015   Procedure:  PPM Generator Changeout;  Surgeon: Deboraha Sprang, MD;  Location: [facility];  Service: Cardiovascular;  Laterality: N/A;  . INSERT / REPLACE / REMOVE PACEMAKER     Implantation-[employer]  . SKIN BIOPSY     face  . TOTAL ABDOMINAL HYSTERECTOMY W/ BILATERAL SALPINGOOPHORECTOMY      SOCIAL HISTORY:  Social History  Substance Use Topics  . Smoking status: Never Smoker  . Smokeless tobacco: Never Used     Comment: Tobacco use-no  . Alcohol use No    FAMILY HISTORY:  Family History  Problem Relation Age of Onset  . Family history unknown: Yes    DRUG ALLERGIES:  Allergies  Allergen Reactions  . Ace Inhibitors Other (See Comments)    Unknown  . Ambien [Zolpidem Tartrate] Other (See Comments)    Dizzy  . Bactrim [Sulfamethoxazole-Trimethoprim] Other (See Comments)    Unknown  . Digoxin And Related Other (See Comments)    Didn't do well  . Hctz [Hydrochlorothiazide]  Other (See Comments)    Unknown  . Lunesta [Eszopiclone] Other (See Comments)    Dizzy    Review of Systems  Constitutional: Positive for malaise/fatigue. Negative for chills, fever and weight loss.  HENT: Positive for hearing loss. Negative for congestion.   Eyes: Negative for blurred vision and double vision.  Respiratory: Positive for shortness of breath. Negative for cough, sputum production and wheezing.   Cardiovascular: Positive for orthopnea and leg swelling. Negative for chest pain, palpitations and PND.  Gastrointestinal: Positive  for abdominal pain and blood in stool. Negative for constipation, diarrhea, nausea and vomiting.  Genitourinary: Negative for dysuria, frequency, hematuria and urgency.  Musculoskeletal: Negative for falls.  Neurological: Negative for dizziness, tremors, focal weakness and headaches.  Endo/Heme/Allergies: Does not bruise/bleed easily.  Psychiatric/Behavioral: Negative for depression. The patient does not have insomnia.     MEDICATIONS AT HOME:  Prior to Admission medications   Medication Sig Start Date End Date Taking? Authorizing Provider  acetaminophen (TYLENOL) 500 MG tablet Take 1,000 mg by mouth daily as needed for mild pain or headache.     [provider]  albuterol (PROVENTIL HFA;VENTOLIN HFA) 108 (90 BASE) MCG/ACT inhaler Inhale 2 puffs into the lungs every 6 (six) hours as needed for wheezing or shortness of breath.     [provider]  amiodarone (PACERONE) 200 MG tablet Take 1 tablet (200 mg total) by mouth daily. 01/18/16   Rise Mu, PA-C  b complex vitamins tablet Take 1 tablet by mouth daily.    [provider]  Carboxymethylcellul-Glycerin (LUBRICATING EYE DROPS OP) Place 2 drops into both eyes 2 (two) times daily.    [provider]  cefUROXime (CEFTIN) 250 MG tablet Take 250 mg by mouth daily as needed.     [provider]  clonazePAM (KLONOPIN) 0.5 MG tablet Take 0.5 mg by mouth 2 (two) times daily as needed for anxiety.    [provider]  cloNIDine (CATAPRES) 0.1 MG tablet Take 1 tablet (0.1 mg total) by mouth as needed. 07/30/16   Minna Merritts, MD  lactulose (CHRONULAC) 10 GM/15ML solution Take 30 g by mouth daily.  05/31/14   [provider]  levothyroxine (SYNTHROID, LEVOTHROID) 50 MCG tablet Take 50 mcg by mouth daily. 75 mcg on Saturday and Sunday    [provider]  losartan (COZAAR) 100 MG tablet Take 1 tablet (100 mg total) by mouth daily as needed. 07/30/16   Minna Merritts, MD    metoprolol (LOPRESSOR) 50 MG tablet Take 1 tablet (50 mg total) by mouth 2 (two) times daily. 07/30/16   Minna Merritts, MD  mometasone-formoterol (DULERA) 100-5 MCG/ACT AERO Inhale 2 puffs into the lungs 2 (two) times daily as needed.     [provider]  montelukast (SINGULAIR) 10 MG tablet Take 10 mg by mouth at bedtime. 10/14/14   [provider]  Multiple Vitamin (MULTIVITAMIN) tablet Take 1 tablet by mouth daily.    [provider]  Multiple Vitamins-Minerals (PRESERVISION AREDS 2 PO) Take by mouth 2 (two) times daily.    [provider]  nitrofurantoin (MACRODANTIN) 100 MG capsule Take 100 mg by mouth 2 (two) times daily.    [provider]  nitroGLYCERIN (NITROSTAT) 0.4 MG SL tablet Place 1 tablet (0.4 mg total) under the tongue every 5 (five) minutes as needed for chest pain. 07/30/16   Minna Merritts, MD  omeprazole (PRILOSEC) 20 MG capsule Take 20 mg by mouth 2 (two)  times daily before a meal.    [provider]  potassium chloride (K-DUR,KLOR-CON) 10 MEQ tablet Take 1 tablet (10 mEq total) by mouth daily as needed. Patient taking differently: Take 10 mEq by mouth daily.  03/28/15   Minna Merritts, MD  Probiotic Product (PROBIOTIC DAILY PO) Take 1 capsule by mouth daily.     [provider]  sertraline (ZOLOFT) 50 MG tablet Take 50 mg by mouth daily.    [provider]  spironolactone (ALDACTONE) 25 MG tablet Take 25 mg by mouth every other day.     [provider]  torsemide (DEMADEX) 20 MG tablet Take 1 tablet (20 mg total) by mouth 2 (two) times daily as needed. 11/22/16   Minna Merritts, MD  Vitamin D, Ergocalciferol, (DRISDOL) 50000 units CAPS capsule Take 50,000 Units by mouth every 7 (seven) days.    [provider]      PHYSICAL EXAMINATION:   VITAL SIGNS: Height 5\' 2"  (1.575 m), weight 67 kg (147 lb 12.8 oz).  GENERAL:  83 y.o.-year-old patient lying in the bed with no acute  distress. Very somnolent, somewhat difficult to wake her up, has difficulty hearing and I'm not able to engage in meaningful conversation EYES: Pupils equal, round, reactive to light and accommodation. No scleral icterus. Extraocular muscles intact.  HEENT: Head atraumatic, normocephalic. Oropharynx and nasopharynx clear.  NECK:  Supple, no jugular venous distention. No thyroid enlargement, no tenderness.  LUNGS: Normal breath sounds bilaterally, no wheezing, rales,rhonchi , few scattered crepitations were heard posteriorly and lung fields. Intermittent use of accessory muscles of respiration, mostly on exertion.  CARDIOVASCULAR: S1, S2 , regular. 2/6 systolic murmurs, no rubs, or gallops.  ABDOMEN: Soft, tender in lower abdomen, some voluntary guarding and suprapubic area, no rebound, nondistended. Bowel sounds present. No organomegaly or mass.  EXTREMITIES: 3+ lower extremity and pedal edema, no cyanosis, or clubbing.  NEUROLOGIC: Cranial nerves II through XII are grossly intact. Muscle strength 5/5 in all extremities. Sensation intact. Gait not checked.  PSYCHIATRIC: The patient is somnolent, awakens easily, able to converse some however, conversation is limited due to patient's poor hearing .  SKIN: No obvious rash, lesion, or ulcer.   LABORATORY PANEL:   CBC  Recent Labs Lab 01/02/17 1455  WBC 10.4  HGB 8.0*  HCT 23.9*  PLT 298  MCV 86.0  MCH 28.7  MCHC 33.4  RDW 15.7*  LYMPHSABS 2.1  MONOABS 0.6  EOSABS 0.3  BASOSABS 0.1   ------------------------------------------------------------------------------------------------------------------  Chemistries   Recent Labs Lab 01/02/17 1456  AST 75*  ALT 48  ALKPHOS 90  BILITOT 0.5   ------------------------------------------------------------------------------------------------------------------  Cardiac Enzymes No results for input(s): TROPONINI in the last 168  hours. ------------------------------------------------------------------------------------------------------------------  RADIOLOGY: Ct Abdomen Pelvis Wo Contrast  Result Date: 01/02/2017 CLINICAL DATA:  Anemia and rectal bleeding, unknown blood thinners EXAM: CT ABDOMEN AND PELVIS WITHOUT CONTRAST TECHNIQUE: Multidetector CT imaging of the abdomen and pelvis was performed following the standard protocol without IV contrast. COMPARISON:  07/03/2016 FINDINGS: Lower chest: Bilateral pleural effusions right greater than left. Mild right lower lobe atelectasis is seen. Hepatobiliary: No focal liver abnormality is seen. Status post cholecystectomy. No biliary dilatation. Pancreas: Mild pancreatic atrophy is seen with fatty interdigitation. No focal mass is seen. Spleen: Normal in size without focal abnormality. Adrenals/Urinary Tract: The adrenal glands are within normal limits. Renal vascular calcifications are noted. No obstructive changes are seen. No renal or ureteral stones are noted. The bladder is partially distended.  Stomach/Bowel: Scattered diverticular change of the colon is noted. Postsurgical changes in the sigmoid colon are seen. No definitive inflammatory change is noted. Changes of prior right colonic surgery are noted. No obstructive changes or inflammatory changes are seen. Vascular/Lymphatic: Aortic atherosclerosis. No enlarged abdominal or pelvic lymph nodes. Reproductive: Status post hysterectomy. No adnexal masses. Persistent soft tissue fullness is noted in the vaginal wall similar to that seen on the prior exam. This is again suspicious for recurrence. Blurring of the perirectal fat plane is noted distally secondary to these changes. Some direct in growth would be difficult to exclude. These changes are stable from the prior study. Other: Minimal free fluid is again seen within the pelvis stable from the prior exam. Musculoskeletal: Degenerative changes of the lumbar spine are seen. Mild  osteopenia is noted. Stable sclerosis of S1 is noted superiorly. IMPRESSION: Persistent abnormal soft tissue thickness in the region of the vagina suspicious for recurrence of carcinoma. There is blurring of the fat plane between the vaginal vault and the adjacent rectum similar to that seen on the prior exam also suspicious for localized invasion. Correlation with the physical exam is recommended. Bilateral pleural effusions right greater than left with right lower lobe atelectasis. Chronic changes as described above. Electronically Signed   By: Inez Catalina M.D.   On: 01/02/2017 18:18    EKG: Orders placed or performed during the hospital encounter of 01/02/17  . EKG 12-Lead  . EKG 12-Lead    IMPRESSION AND PLAN:  Active Problems:   Gastrointestinal bleeding   Acute posthemorrhagic anemia   Dyspnea   Swelling of lower extremity   Generalized weakness   Abdominal pain  #1. Gastrointestinal bleeding, dilatation medical floor, initiate her on Protonix, awaiting for CT of abdomen and pelvis results, follow hemoglobin level in the morning , 55 sent patient to bleeding scan if needed #2. Acute on chronic diastolic CHF, initiate patient on Lasix intravenously, follow ins and outs, weight, continue oxygen therapy, get echocardiogram #3. Dyspnea, possibly due to anemia, but concerning for diastolic CHF, getting chest x-ray, patient has reported hypotensive episodes, cannot rule out pulmonary embolism as well #4. Acute on chronic posthemorrhagic anemia, underlying chronic anemia of iron deficiency and recent gastrointestinal bleed, may need to transfuse after diuresis, follow hemoglobin level closely, discussed this patient's daughter. Risks as well as benefits, asked to sign transfusion consent #5 hypotension, hold blood pressure medications, resume blood pressure medications if blood pressure is stable #6 lower extremity swelling, get Doppler ultrasound to rule out DVT   All the records are  reviewed and case discussed with ED provider. Management plans discussed with the patient, family and they are in agreement.  CODE STATUS: Code Status History    This patient does not have a recorded code status. Please follow your organizational policy for patients in this situation.    Advance Directive Documentation     Most Recent Value  Type of Advance Directive  Healthcare Power of Attorney  Pre-existing out of facility DNR order (yellow form or pink MOST form)  -  "MOST" Form in Place?  -       TOTAL TIME TAKING CARE OF THIS PATIENT: 55 minutes.    Theodoro Grist M.D on 01/02/2017 at 6:32 PM  Between 7am to 6pm - Pager - [phone] After 6pm go to [email or web address] - password [email or web address] [facility]  Office  [phone]  CC: Primary care physician; Tracie Harrier, MD

## 2017-01-02 NOTE — Patient Instructions (Signed)
Patient being direct admitted to 1C for symptomatic anemia, admitting physician is Dr. Ether Griffins. Patient is currently in CT scan and will be transported to admitting and registration after her scan is performed. Patient will be admitted to room 108, report called to [facility] on 1C.

## 2017-01-02 NOTE — Progress Notes (Signed)
[facility] Initial Visit:  Patient Care Team: Tracie Harrier, MD as PCP - General (Internal Medicine) Minna Merritts, MD as Consulting Physician (Cardiology)  CHIEF COMPLAINTS/PURPOSE OF CONSULTATION: Low blood count.   HISTORY OF PRESENTING ILLNESS: Kathryn Short 83 y.o. female with PMH listed as below, is urgently referred by PCP and [facility] NP Joelene Millin today for same day visit for evaluation of acute on chronic anemia.  Patient is accompanied by her daughter who provide most information. Patient is hard hearing and poor historian. Per daughter patient has been on Eliquis for a year due to A-Fib. Last Friday, daughter noticed black blood on patient's pad , and later little bit bright red blood mixed with her loose stools (took lactulose). Patient has been feeling more fatigued lately, SOB more than her baseline. Appetite is poor. Eliquis was held, last dose on 12/28/2016.  Patient has labs done earlier this month which showed Hb in 10s. 01/01/2017 when patient was seen by [facility], she had a repeat CBC done which showed acute drop of 2 grams of Hb. Hemoccult was positive with brown stool. A CT abdomen with oral contrast was ordered and scheduled at 3PM today. I was called by NP to see if we can evaluate patient urgently for the acute drop of anemia.  Per daughter patient has a remote history of uterine cancer in 1952, treated with RT.   Review of Systems  Constitutional: Positive for fatigue.  HENT:   Positive for hearing loss.   Eyes: Negative.   Respiratory: Positive for shortness of breath.   Cardiovascular: Negative.   Gastrointestinal:       Rectal bleeding  Endocrine: Negative.   Genitourinary: Negative.    Musculoskeletal: Negative.   Skin: Negative.   Neurological: Negative.   Hematological: Negative.   Psychiatric/Behavioral: Negative.     MEDICAL HISTORY: Past Medical History:  Diagnosis Date  . Atrial fibrillation ([place])   . Bowel  obstruction ([place])   . CHF (congestive heart failure) ([place])   . Fall   . GERD (gastroesophageal reflux disease)   . Hypertension   . Hypothyroidism    Treated  . Melanoma ([place])    Resected from back  . OSA (obstructive sleep apnea)    On BiPAP  . Uterine cancer [facility]    s/p radiation therapy in 1952    SURGICAL HISTORY: Past Surgical History:  Procedure Laterality Date  . CHOLECYSTECTOMY    . COLOSTOMY     For perforation  . Cytoscopy    . EP IMPLANTABLE DEVICE N/A 09/07/2015   Procedure:  PPM Generator Changeout;  Surgeon: Deboraha Sprang, MD;  Location: [facility];  Service: Cardiovascular;  Laterality: N/A;  . INSERT / REPLACE / REMOVE PACEMAKER     Implantation-[employer]  . SKIN BIOPSY     face  . TOTAL ABDOMINAL HYSTERECTOMY W/ BILATERAL SALPINGOOPHORECTOMY      SOCIAL HISTORY: Social History   Social History  . Marital status: Widowed    Spouse name: N/A  . Number of children: 5  . Years of education: N/A   Occupational History  .  Retired   Social History Main Topics  . Smoking status: Never Smoker  . Smokeless tobacco: Never Used     Comment: Tobacco use-no  . Alcohol use No  . Drug use: No  . Sexual activity: Not on file   Other Topics Concern  . Not on file   Social History Narrative  .  No narrative on file    FAMILY HISTORY Family History  Problem Relation Age of Onset  . Family history unknown: Yes    ALLERGIES:  is allergic to ace inhibitors; ambien [zolpidem tartrate]; bactrim [sulfamethoxazole-trimethoprim]; digoxin and related; hctz [hydrochlorothiazide]; and lunesta [eszopiclone].  MEDICATIONS:  Current Outpatient Prescriptions  Medication Sig Dispense Refill  . acetaminophen (TYLENOL) 500 MG tablet Take 1,000 mg by mouth daily as needed for mild pain or headache.     . albuterol (PROVENTIL HFA;VENTOLIN HFA) 108 (90 BASE) MCG/ACT inhaler Inhale 2 puffs into the lungs every 6 (six) hours as needed for wheezing or shortness of  breath.     Marland Kitchen amiodarone (PACERONE) 200 MG tablet Take 1 tablet (200 mg total) by mouth daily. 90 tablet 3  . b complex vitamins tablet Take 1 tablet by mouth daily.    . Carboxymethylcellul-Glycerin (LUBRICATING EYE DROPS OP) Place 2 drops into both eyes 2 (two) times daily.    . cefUROXime (CEFTIN) 250 MG tablet Take 250 mg by mouth daily as needed.     . clonazePAM (KLONOPIN) 0.5 MG tablet Take 0.5 mg by mouth 2 (two) times daily as needed for anxiety.    . cloNIDine (CATAPRES) 0.1 MG tablet Take 1 tablet (0.1 mg total) by mouth as needed. 60 tablet 6  . lactulose (CHRONULAC) 10 GM/15ML solution Take 30 g by mouth daily.     Marland Kitchen levothyroxine (SYNTHROID, LEVOTHROID) 50 MCG tablet Take 50 mcg by mouth daily.     Marland Kitchen losartan (COZAAR) 100 MG tablet Take 1 tablet (100 mg total) by mouth daily as needed. 30 tablet 6  . metoprolol (LOPRESSOR) 50 MG tablet Take 1 tablet (50 mg total) by mouth 2 (two) times daily. 180 tablet 3  . mometasone-formoterol (DULERA) 100-5 MCG/ACT AERO Inhale 2 puffs into the lungs 2 (two) times daily as needed.     . montelukast (SINGULAIR) 10 MG tablet Take 10 mg by mouth at bedtime.    . Multiple Vitamin (MULTIVITAMIN) tablet Take 1 tablet by mouth daily.    . Multiple Vitamins-Minerals (PRESERVISION AREDS 2 PO) Take by mouth 2 (two) times daily.    . nitrofurantoin (MACRODANTIN) 100 MG capsule Take 100 mg by mouth 2 (two) times daily.    . nitroGLYCERIN (NITROSTAT) 0.4 MG SL tablet Place 1 tablet (0.4 mg total) under the tongue every 5 (five) minutes as needed for chest pain. 25 tablet 6  . potassium chloride (K-DUR,KLOR-CON) 10 MEQ tablet Take 1 tablet (10 mEq total) by mouth daily as needed. (Patient taking differently: Take 10 mEq by mouth daily. ) 90 tablet 3  . Probiotic Product (PROBIOTIC DAILY PO) Take 1 capsule by mouth daily.     . sertraline (ZOLOFT) 50 MG tablet Take 50 mg by mouth daily.    Marland Kitchen spironolactone (ALDACTONE) 25 MG tablet Take 25 mg by mouth every  other day.     . torsemide (DEMADEX) 20 MG tablet Take 1 tablet (20 mg total) by mouth 2 (two) times daily as needed. 180 tablet 3  . Vitamin D, Ergocalciferol, (DRISDOL) 50000 units CAPS capsule Take 50,000 Units by mouth every 7 (seven) days.     No current facility-administered medications for this visit.     PHYSICAL EXAMINATION:  ECOG PERFORMANCE STATUS: 2 - Symptomatic, <50% confined to bed   Vitals:   01/02/17 1443  BP: 133/75  Pulse: 60  Resp: 20  Temp: (!) 97.5 F (36.4 C)    Filed Weights  01/02/17 1443  Weight: 146 lb 9.6 oz (66.5 kg)     Physical Exam GENERAL: No distress, well nourished. pale SKIN:  No rashes or significant lesions  HEAD: Normocephalic, No masses, lesions, tenderness or abnormalities  EYES: pallor non icteric ENT: External ears normal ,lips , buccal mucosa, and tongue normal and mucous membranes are moist  LYMPH: No palpable cervical and axillary lymphadenopathy  LUNGS: Clear to auscultation, no crackles or wheezes HEART: Regular rate & rhythm, no murmurs, no gallops, S1 normal and S2 normal  ABDOMEN: Abdomen soft, non-tender, normal bowel sounds, I did not appreciate any  masses or organomegaly  MUSCULOSKELETAL: No CVA tenderness and no tenderness on percussion of the back or rib cage.  EXTREMITIES: No edema, no skin discoloration or tenderness NEURO: Alert & oriented, no focal motor/sensory deficits.   LABORATORY DATA: I have personally reviewed the data as listed:  No visits with results within 1 Month(s) from this visit.  Latest known visit with results is:  Clinical Support on 10/24/2016  Component Date Value Ref Range Status  . Date Time Interrogation Session 10/24/2016 [id number]   Final  . Pulse Generator Manufacturer 10/24/2016 MERM   Final  . Pulse Gen Model 10/24/2016 ADDR01 Adapta   Final  . Pulse Gen Serial Number 10/24/2016 [id number]   Final  . Clinic Name 10/24/2016 [facility]   Final  . Implantable Pulse  Generator Type 10/24/2016 Implantable Pulse Generator   Final  . Implantable Pulse Generator Implan* 10/24/2016 [id number]   Final  . Implantable Lead Manufacturer 10/24/2016 MERM   Final  . Implantable Lead Model 10/24/2016 5076 CapSureFix Novus   Final  . Implantable Lead Serial Number 10/24/2016 [id number]   Final  . Implantable Lead Implant Date 10/24/2016 [id number]   Final  . Implantable Lead Location Detail 1 10/24/2016 APPENDAGE   Final  . Implantable Lead Location 10/24/2016 [id number]   Final  . Implantable Lead Manufacturer 10/24/2016 MERM   Final  . Implantable Lead Model 10/24/2016 5076 CapSureFix Novus   Final  . Implantable Lead Serial Number 10/24/2016 [id number]   Final  . Implantable Lead Implant Date 10/24/2016 [id number]   Final  . Implantable Lead Location Detail 1 10/24/2016 APEX   Final  . Implantable Lead Location 10/24/2016 [id number]   Final  . Lead Channel Setting Sensing Sensi* 10/24/2016 2.80  mV Final  . Lead Channel Setting Pacing Amplit* 10/24/2016 2.000  V Final  . Lead Channel Setting Pacing Pulse * 10/24/2016 0.40  ms Final  . Lead Channel Setting Pacing Amplit* 10/24/2016 3.250  V Final  . Lead Channel Impedance Value 10/24/2016 361  ohm Final  . Lead Channel Pacing Threshold Ampl* 10/24/2016 1.000  V Final  . Lead Channel Pacing Threshold Puls* 10/24/2016 0.40  ms Final  . Lead Channel Impedance Value 10/24/2016 432  ohm Final  . Lead Channel Pacing Threshold Ampl* 10/24/2016 1.625  V Final  . Lead Channel Pacing Threshold Puls* 10/24/2016 0.40  ms Final  . Battery Status 10/24/2016 OK   Final  . Battery Remaining Longevity 10/24/2016 130  mo Final  . Battery Voltage 10/24/2016 2.79  V Final  . Battery Impedance 10/24/2016 127  ohm Final  . Brady Statistic AP VP Percent 10/24/2016 0  % Final  . Brady Statistic AS VP Percent 10/24/2016 0  % Final  . Brady Statistic AP VS Percent 10/24/2016 100  % Final  . Brady Statistic AS VS Percent 10/24/2016 0  % Final  .  Elizabeth Sauer  Rhythm 10/24/2016 Ap/Vs   Final    RADIOGRAPHIC STUDIES: I have personally reviewed the radiological images as listed and agree with the findings in the report CT abdomen pelvis 07/03/2016.  IMPRESSION: 1. Abnormal soft tissue mass in the region of the vagina worrisome recurrence of carcinoma. This lesion may invade the adjacent perirectal fat planes and possibly the base of the urinary bladder. 2. Small right pleural effusion. 3. Changes of cirrhosis are again noted. 4. Significant abdominal aortic atherosclerotic change. 5. Small amount of free fluid in the pelvis. 6. Diffuse osteopenia.   ASSESSMENT/PLAN 1. Anemia, unspecified type   2. Symptomatic anemia   3. Rectal bleeding   4. Fatigue associated with anemia   5. SOB (shortness of breath)   6. Malignant neoplasm of uterus, unspecified site [facility])    #Acute on chronic anemia, likely due to ongoing blood loss. Source of bleeding possibly from GI tract, or vaginal (remote uterine CA history). Check CBC, LFT, haptoglobin, LDH, reticulocytes, type and screen, PT, PTT,  Folate, B12. Ferritin was checked back on 12/13/2016, before current bleeding episode was normal. Repeat today. STAT type and screen. # Complete work up with CT abdomen pelvis with oral contrast  ordered by GI.  # Symptomatic anemia: talked hospitalist Dr.Vaickute and patient will be directly admitted to hospital for blood transfusion for symptomatic anemia.  Discussed plan with patient and daughter and they are in agreement.   Orders Placed This Encounter  Procedures  . Lactate dehydrogenase    Standing Status:   Future    Number of Occurrences:   1    Standing Expiration Date:   01/02/2018  . Iron and TIBC    Standing Status:   Future    Number of Occurrences:   1    Standing Expiration Date:   01/02/2018  . Ferritin    Standing Status:   Future    Number of Occurrences:   1    Standing Expiration Date:   01/02/2018  . Folate    Standing Status:   Future     Number of Occurrences:   1    Standing Expiration Date:   01/02/2018  . Haptoglobin    Standing Status:   Future    Number of Occurrences:   1    Standing Expiration Date:   01/02/2018  . Vitamin B12    Standing Status:   Future    Number of Occurrences:   1    Standing Expiration Date:   01/02/2018  . Reticulocytes    Standing Status:   Future    Number of Occurrences:   1    Standing Expiration Date:   01/02/2018  . Protime-INR    Standing Status:   Future    Number of Occurrences:   1    Standing Expiration Date:   01/02/2018  . APTT    Standing Status:   Future    Number of Occurrences:   1    Standing Expiration Date:   01/02/2018  . CBC with Differential/Platelet    Standing Status:   Future    Number of Occurrences:   1    Standing Expiration Date:   01/02/2018  . Type and screen    Standing Status:   Future    Number of Occurrences:   1    Standing Expiration Date:   01/02/2018   Follow up in 10 days .  All questions were answered. The patient knows to call the clinic with  any problems, questions or concerns. Thank you for this kind referral and the opportunity to participate in the care of this patient. A copy of today's note will be routed to referring physicians from [facility] and PCP.  Total face to face encounter time for this patient visit was 60 min. >50% of the time was  spent in counseling and coordination of care.  Earlie Server, MD  01/02/2017 3:00 PM

## 2017-01-03 DIAGNOSIS — Z515 Encounter for palliative care: Secondary | ICD-10-CM

## 2017-01-03 DIAGNOSIS — K922 Gastrointestinal hemorrhage, unspecified: Principal | ICD-10-CM

## 2017-01-03 DIAGNOSIS — Z7189 Other specified counseling: Secondary | ICD-10-CM

## 2017-01-03 DIAGNOSIS — R531 Weakness: Secondary | ICD-10-CM

## 2017-01-03 DIAGNOSIS — N898 Other specified noninflammatory disorders of vagina: Secondary | ICD-10-CM

## 2017-01-03 DIAGNOSIS — N899 Noninflammatory disorder of vagina, unspecified: Secondary | ICD-10-CM

## 2017-01-03 LAB — URINALYSIS, COMPLETE (UACMP) WITH MICROSCOPIC
Bilirubin Urine: NEGATIVE
GLUCOSE, UA: NEGATIVE mg/dL
Ketones, ur: NEGATIVE mg/dL
NITRITE: NEGATIVE
PROTEIN: NEGATIVE mg/dL
SPECIFIC GRAVITY, URINE: 1.004 — AB (ref 1.005–1.030)
pH: 7 (ref 5.0–8.0)

## 2017-01-03 LAB — CBC
HCT: 22.3 % — ABNORMAL LOW (ref 35.0–47.0)
Hemoglobin: 7.5 g/dL — ABNORMAL LOW (ref 12.0–16.0)
MCH: 28.5 pg (ref 26.0–34.0)
MCHC: 33.4 g/dL (ref 32.0–36.0)
MCV: 85.2 fL (ref 80.0–100.0)
PLATELETS: 278 10*3/uL (ref 150–440)
RBC: 2.62 MIL/uL — ABNORMAL LOW (ref 3.80–5.20)
RDW: 15.3 % — AB (ref 11.5–14.5)
WBC: 10.8 10*3/uL (ref 3.6–11.0)

## 2017-01-03 LAB — HEMOGLOBIN AND HEMATOCRIT, BLOOD
HEMATOCRIT: 29 % — AB (ref 35.0–47.0)
Hemoglobin: 9.8 g/dL — ABNORMAL LOW (ref 12.0–16.0)

## 2017-01-03 LAB — BASIC METABOLIC PANEL
ANION GAP: 8 (ref 5–15)
BUN: 14 mg/dL (ref 6–20)
CALCIUM: 8 mg/dL — AB (ref 8.9–10.3)
CHLORIDE: 98 mmol/L — AB (ref 101–111)
CO2: 28 mmol/L (ref 22–32)
CREATININE: 1.07 mg/dL — AB (ref 0.44–1.00)
GFR calc Af Amer: 50 mL/min — ABNORMAL LOW (ref >60)
GFR, EST NON AFRICAN AMERICAN: 43 mL/min — AB (ref >60)
GLUCOSE: 108 mg/dL — AB (ref 65–99)
POTASSIUM: 3.6 mmol/L (ref 3.5–5.1)
Sodium: 134 mmol/L — ABNORMAL LOW (ref 135–145)

## 2017-01-03 LAB — HEMOGLOBIN A1C
HEMOGLOBIN A1C: 5.4 % (ref 4.8–5.6)
MEAN PLASMA GLUCOSE: 108 mg/dL

## 2017-01-03 LAB — ECHOCARDIOGRAM COMPLETE
Height: 62 in
Weight: 2376 oz

## 2017-01-03 LAB — GLUCOSE, CAPILLARY: GLUCOSE-CAPILLARY: 102 mg/dL — AB (ref 65–99)

## 2017-01-03 LAB — TROPONIN I

## 2017-01-03 LAB — HAPTOGLOBIN: HAPTOGLOBIN: 251 mg/dL — AB (ref 34–200)

## 2017-01-03 LAB — ABO/RH: ABO/RH(D): O NEG

## 2017-01-03 LAB — PREPARE RBC (CROSSMATCH)

## 2017-01-03 MED ORDER — ACETAMINOPHEN 325 MG PO TABS
650.0000 mg | ORAL_TABLET | Freq: Four times a day (QID) | ORAL | Status: DC | PRN
  Administered 2017-01-03: 03:00:00 650 mg via ORAL
  Filled 2017-01-03: qty 2

## 2017-01-03 MED ORDER — ENSURE ENLIVE PO LIQD
237.0000 mL | Freq: Two times a day (BID) | ORAL | Status: DC
  Administered 2017-01-04: 11:00:00 237 mL via ORAL

## 2017-01-03 MED ORDER — DEXTROSE 5 % IV SOLN
1.0000 g | INTRAVENOUS | Status: DC
  Administered 2017-01-03: 18:00:00 1 g via INTRAVENOUS
  Filled 2017-01-03 (×2): qty 10

## 2017-01-03 MED ORDER — SODIUM CHLORIDE 0.9 % IV SOLN
Freq: Once | INTRAVENOUS | Status: AC
  Administered 2017-01-03: 14:00:00 via INTRAVENOUS

## 2017-01-03 MED ORDER — FUROSEMIDE 10 MG/ML IJ SOLN
40.0000 mg | Freq: Once | INTRAMUSCULAR | Status: AC
  Administered 2017-01-03: 18:00:00 40 mg via INTRAVENOUS
  Filled 2017-01-03: qty 4

## 2017-01-03 MED ORDER — SERTRALINE HCL 50 MG PO TABS
50.0000 mg | ORAL_TABLET | Freq: Every day | ORAL | Status: DC
  Administered 2017-01-03: 22:00:00 50 mg via ORAL
  Filled 2017-01-03: qty 1

## 2017-01-03 NOTE — Progress Notes (Signed)
Initial Nutrition Assessment  DOCUMENTATION CODES:   Non-severe (moderate) malnutrition in context of chronic illness  INTERVENTION:  Provide Ensure Enlive po BID, each supplement provides 350 kcal and 20 grams of protein.   Encouraged patient's daughter to find a protein drink with more protein and calories. Discussed options available in stores. Encouraged intake twice daily to help meet a better proportion of patient's calorie/protein needs.  Discussed small, frequent meals and proving patient with calorie- and protein-dense foods.  NUTRITION DIAGNOSIS:   Malnutrition (Moderate) related to chronic illness (dementia, advanced age, CHF) as evidenced by mild depletion of body fat, moderate depletion of body fat, mild depletion of muscle mass, moderate depletions of muscle mass.  GOAL:   Patient will meet greater than or equal to 90% of their needs  MONITOR:   PO intake, Supplement acceptance, Labs, Weight trends, I & O's  REASON FOR ASSESSMENT:   Malnutrition Screening Tool    ASSESSMENT:   83 year old female with PMHx of dementia, A-fib, HTN, OSA on BiPAP, GERD, hypothyroidism, hx of bowel obstruction, CHF, uterine cancer s/p XRT in 1952 admitted with lower GIB with acute blood loss anemia, vaginal mass, acute on chronic diastolic CHF.   Spoke with patient and her daughter at bedside. Daughter reports patient has had a decreased appetite for the past year. She still enjoys eating sweets, but does not do well with her meals anymore. Daughter prepares patient breakfast and dinner. She will only eat bites of these meals. In the middle of the day she will have snacks or ice cream. She also has on Equate protein shake daily.   UBW was 162 lbs. Daughter reports she weights approximately 147 lbs now. Per chart patient was 164.5 lbs on 01/18/2016 and has lost 17.9 lbs (10.9% body weight) over the past 11 months, which is not significant for time frame.  Medications reviewed and include:  acidophilus, amiodarone, Lasix 40 mg once IV, levothyroxine, Ocuvite-Lutein 2 capsules BID, pantoprazole, sertraline, ceftriaxone.  Labs reviewed: Glucose 102, Sodium 134, Chloride 98, Creatinine 1.07.   Nutrition-Focused physical exam completed. Findings are mild-moderate fat depletion, mild-moderate muscle depletion, and moderate edema.   Diet Order:  Diet full liquid Room service appropriate? Yes; Fluid consistency: Thin  Skin:  Reviewed, no issues  Last BM:  01/03/2017 - type 6  Height:   Ht Readings from Last 1 Encounters:  01/02/17 5\' 2"  (1.575 m)    Weight:   Wt Readings from Last 1 Encounters:  01/03/17 156 lb (70.8 kg)    Ideal Body Weight:  50 kg  BMI:  Body mass index is 28.53 kg/m.  Estimated Nutritional Needs:   Kcal:  1290-1500 (MSJ x 1.2-1.4)  Protein:  67-80 grams (1-1.2 grams/kg)  Fluid:  1.7 L/day (25 ml/kg)  EDUCATION NEEDS:   Education needs addressed  Willey Blade, MS, RD, LDN Pager: [phone] After Hours Pager: [phone]

## 2017-01-03 NOTE — Evaluation (Signed)
Occupational Therapy Evaluation Patient Details Name: Kathryn Short MRN: [id number] DOB: 21-Jan-1922 Today's Date: 01/03/2017    History of Present Illness Pt. is a 83 y.o. female who was admitted to [facility] with a GI Bleed, fatigue, SOB, and Acute Anemia. Pt. PMHx includes: pacemaker, remote Uterine CA, A-Fib, bowel obstruction, CHF, HTN, Hypothyroidism, melanoma, OSA, Chronic Iron Defficiency Anemia, Cirrhosis of the liver., and soft tissue mass in the vaginal area.   Clinical Impression   Pt. Is a 83 y.o female who was admitted to [facility] with a GI bleed, fatigue, SOB , and acute anemia. Pt. Is receiving blood transfusion. Pt. resides with her daughter. Pt's daughter provides all assistance for ADL, and IADL care including cooking, cleaning, and meal preparation. Pt. Has recently had HHPT services prior to admission. Pt.'s daughter reports HHOT recently came to the home to assess the pt. They determined the services were not needed at that time because the daughter provides the needed assistance at home. Pt.'s daughter reports plans to continue assisting pt. with ADLs, and self-care tasks upon discharge.They have the appropriate equipment needed, as well as a hand held shower head. Together with the pt.'s daughter, it was determined that no additional OT services are indicated at this time.    Follow Up Recommendations  No OT follow up    Equipment Recommendations       Recommendations for Other Services       Precautions / Restrictions Precautions Precautions: Fall;ICD/Pacemaker             ADL either performed or assessed with clinical judgement   ADL Overall ADL's : Needs assistance/impaired Eating/Feeding: Set up   Grooming: Set up   Upper Body Bathing: Minimal assistance   Lower Body Bathing: Moderate assistance   Upper Body Dressing : Minimal assistance   Lower Body Dressing: Moderate assistance                 General ADL Comments: Pt. and daughter education  was provided about OT services, and weighted utensils through verbal education, and visual demonstration.     Vision         Perception     Praxis      Pertinent Vitals/Pain Pain Assessment: No/denies pain     Hand Dominance Right   Extremity/Trunk Assessment Upper Extremity Assessment Upper Extremity Assessment: Generalized weakness (Dtaughter reports essential tremors.)      Cervical / Trunk Assessment Cervical / Trunk Assessment: Normal   Communication Communication Communication: HOH   Cognition Arousal/Alertness: Awake/alert Behavior During Therapy: WFL for tasks assessed/performed Overall Cognitive Status: History of cognitive impairments - at baseline                                 General Comments: per pt's daughter history of cognitive deficits at baseline worsening recently with anemia; pt oriented to person/self; pt not oriented to place, time, situation   General Comments       Exercises     Shoulder Instructions      Home Living Family/patient expects to be discharged to:: Private residence Living Arrangements: Children Available Help at Discharge: Family;Available 24 hours/day Type of Home: House Home Access: Ramped entrance     Home Layout: One level     Bathroom Shower/Tub: Occupational psychologist: Handicapped height Bathroom Accessibility: Yes   Home Equipment: Environmental consultant - 4 wheels;Cane - quad;Cane - single point;Bedside commode;Shower seat - built  in;Toilet riser;Grab bars - toilet, handheld shower head.          Prior Functioning/Environment Level of Independence: Needs assistance  Gait / Transfers Assistance Needed: per pt's daughter pt was modified independent with use of 4 wheel RW for transfers ADL's / Homemaking Assistance Needed: Pt.'s daughter assist pt. with all morning care, ADLs, IADLs, medication managementm and meal preparation.            OT Problem List: Decreased strength;Decreased  cognition;Decreased knowledge of use of DME or AE;Decreased activity tolerance;Impaired UE functional use      OT Treatment/Interventions:      OT Goals(Current goals can be found in the care plan section) Acute Rehab OT Goals Patient Stated Goal: To return home OT Goal Formulation: With patient/family Potential to Achieve Goals: Good  OT Frequency:     Barriers to D/C:            Co-evaluation              AM-PAC PT "6 Clicks" Daily Activity     Outcome Measure Help from another person eating meals?: A Little Help from another person taking care of personal grooming?: A Little Help from another person toileting, which includes using toliet, bedpan, or urinal?: A Lot Help from another person bathing (including washing, rinsing, drying)?: A Lot Help from another person to put on and taking off regular upper body clothing?: A Little Help from another person to put on and taking off regular lower body clothing?: A Lot 6 Click Score: 15   End of Session    Activity Tolerance: Patient limited by fatigue Patient left: in chair;with call bell/phone within reach;with chair alarm set  OT Visit Diagnosis: Muscle weakness (generalized) (M62.81)                Time: 4431-5400 OT Time Calculation (min): 15 min Charges:  OT General Charges $OT Visit: 1 Procedure OT Evaluation $OT Eval Low Complexity: 1 Procedure G-Codes:     Harrel Carina, MS, OTR/L   Harrel Carina, MS, OTR/L 01/03/2017, 3:30 PM

## 2017-01-03 NOTE — Progress Notes (Signed)
Pt to get 1 unit pRBC. Blood consent signed & on chart. Education printed and reviewed with patient and patient's daughter/POA.

## 2017-01-03 NOTE — Consult Note (Addendum)
Consult History and Physical   SERVICE: Gynecology   Patient Name: Kathryn Short Patient MRN:   [id number]  CC: rectal bleeding, perirectal mass  HPI: Kathryn Short is a 83 y.o. with distant history of uterine cancer s/p hysterectomy in 1970 and radiation therapy in 1952.  She has a known and stable soft tissue perirectal mass, and was seen in evaluation for this if February by my partner, Dr. Ouida Sills. He reviewed her history then:  "pt sent to [place] for consultation from Dr Ginette Pitman for possible recurrence of pelvic cancer based on soft tissue thickening of the anterior perirectal tissue and base of bladder seen on ctscan 3 weeks ago . Pt had XRT in 1952 for Utx Cancer ( no records) TAH / BSO in 1970 . Pt has been seen by Dr Bernardo Heater( urology) before and cystoscopy was done .She had bowel obstruction and colostomy in past . Also s/p with a right colectomy due to polyps 2005 ( no records)"  I was consulted today due to a new episode of rectal bleeding, for which a CT scan was done and demonstrated the same perirectal/vaginal mass. The bleeding was definitely coming from the rectum, per her daughter, Kathryn Short.  She states that since she held the Eliquis, the bleeding has now subsided.  She denies weight loss, weight gain, change in appetite, pelvic pain, change in urinary habits, change in bowel habits.  Since being on iron supplementation, her stools have been black.  She has chronic anemia.  She wears depends undergarments for incontinence.     Review of Systems: positives in bold GEN:   fevers, chills, weight changes, appetite changes, fatigue, night sweats HEENT:  HA, vision changes, hearing loss, congestion, rhinorrhea, sinus pressure, dysphagia CV:   CP, palpitations PULM:  SOB, cough GI:  abd pain, N/V/D/C GU:  dysuria, urgency, frequency MSK:  arthralgias, myalgias, back pain, swelling SKIN:  rashes, color changes, pallor NEURO:  numbness, weakness, tingling, seizures, dizziness,  tremors PSYCH:  depression, anxiety, behavioral problems, confusion  HEME/LYMPH:  easy bruising or bleeding ENDO:  heat/cold intolerance  Past Obstetrical History: OB History    No data available      Past Gynecologic History: No LMP recorded. Patient is postmenopausal. s/p TAH BSO, s/p XRT for uterine cancer   Past Medical History: Past Medical History:  Diagnosis Date  . Atrial fibrillation ([place])   . Bowel obstruction ([place])   . CHF (congestive heart failure) ([place])   . Fall   . GERD (gastroesophageal reflux disease)   . Hypertension   . Hypothyroidism    Treated  . Melanoma ([place])    Resected from back  . OSA (obstructive sleep apnea)    On BiPAP  . Uterine cancer [facility])    s/p radiation therapy in 1952    Past Surgical History:   Past Surgical History:  Procedure Laterality Date  . CHOLECYSTECTOMY    . COLOSTOMY     For perforation  . Cytoscopy    . EP IMPLANTABLE DEVICE N/A 09/07/2015   Procedure:  PPM Generator Changeout;  Surgeon: Deboraha Sprang, MD;  Location: [facility];  Service: Cardiovascular;  Laterality: N/A;  . INSERT / REPLACE / REMOVE PACEMAKER     Implantation-[employer]  . SKIN BIOPSY     face  . TOTAL ABDOMINAL HYSTERECTOMY W/ BILATERAL SALPINGOOPHORECTOMY      Family History:  Family history is unknown by patient.  Social History:  Social History   Social History  .  Marital status: Widowed    Spouse name: N/A  . Number of children: 5  . Years of education: N/A   Occupational History  .  Retired   Social History Main Topics  . Smoking status: Never Smoker  . Smokeless tobacco: Never Used     Comment: Tobacco use-no  . Alcohol use No  . Drug use: No  . Sexual activity: Not on file   Other Topics Concern  . Not on file   Social History Narrative  . No narrative on file    Home Medications:  Medications reconciled in EPIC  No current facility-administered medications on file prior to encounter.    Current  Outpatient Prescriptions on File Prior to Encounter  Medication Sig Dispense Refill  . acetaminophen (TYLENOL) 500 MG tablet Take 1,000 mg by mouth daily as needed for mild pain or headache.     . albuterol (PROVENTIL HFA;VENTOLIN HFA) 108 (90 BASE) MCG/ACT inhaler Inhale 2 puffs into the lungs every 6 (six) hours as needed for wheezing or shortness of breath.     Marland Kitchen amiodarone (PACERONE) 200 MG tablet Take 1 tablet (200 mg total) by mouth daily. 90 tablet 3  . b complex vitamins tablet Take 1 tablet by mouth daily.    . Carboxymethylcellul-Glycerin (LUBRICATING EYE DROPS OP) Place 2 drops into both eyes 2 (two) times daily.    . clonazePAM (KLONOPIN) 0.5 MG tablet Take 0.5 mg by mouth 2 (two) times daily as needed for anxiety.    . cloNIDine (CATAPRES) 0.1 MG tablet Take 1 tablet (0.1 mg total) by mouth as needed. 60 tablet 6  . lactulose (CHRONULAC) 10 GM/15ML solution Take 30 g by mouth daily.     Marland Kitchen levothyroxine (SYNTHROID, LEVOTHROID) 50 MCG tablet Take 50 mcg by mouth daily. 75 mcg on Saturday and Sunday    . losartan (COZAAR) 100 MG tablet Take 1 tablet (100 mg total) by mouth daily as needed. 30 tablet 6  . metoprolol (LOPRESSOR) 50 MG tablet Take 1 tablet (50 mg total) by mouth 2 (two) times daily. 180 tablet 3  . montelukast (SINGULAIR) 10 MG tablet Take 10 mg by mouth at bedtime.    . Multiple Vitamin (MULTIVITAMIN) tablet Take 1 tablet by mouth daily.    . Multiple Vitamins-Minerals (PRESERVISION AREDS 2 PO) Take by mouth 2 (two) times daily.    . nitroGLYCERIN (NITROSTAT) 0.4 MG SL tablet Place 1 tablet (0.4 mg total) under the tongue every 5 (five) minutes as needed for chest pain. 25 tablet 6  . omeprazole (PRILOSEC) 20 MG capsule Take 20 mg by mouth 2 (two) times daily before a meal.    . potassium chloride (K-DUR,KLOR-CON) 10 MEQ tablet Take 1 tablet (10 mEq total) by mouth daily as needed. (Patient taking differently: Take 10 mEq by mouth daily. ) 90 tablet 3  . sertraline  (ZOLOFT) 50 MG tablet Take 50 mg by mouth daily.    Marland Kitchen spironolactone (ALDACTONE) 25 MG tablet Take 25 mg by mouth every other day.     . torsemide (DEMADEX) 20 MG tablet Take 1 tablet (20 mg total) by mouth 2 (two) times daily as needed. 180 tablet 3  . Vitamin D, Ergocalciferol, (DRISDOL) 50000 units CAPS capsule Take 50,000 Units by mouth every 7 (seven) days.    . cefUROXime (CEFTIN) 250 MG tablet Take 250 mg by mouth daily as needed.     . mometasone-formoterol (DULERA) 100-5 MCG/ACT AERO Inhale 2 puffs into the lungs 2 (two)  times daily as needed.     . nitrofurantoin (MACRODANTIN) 100 MG capsule Take 100 mg by mouth 2 (two) times daily.    . Probiotic Product (PROBIOTIC DAILY PO) Take 1 capsule by mouth daily.       Allergies:  Allergies  Allergen Reactions  . Ace Inhibitors Other (See Comments)    Unknown  . Ambien [Zolpidem Tartrate] Other (See Comments)    Dizzy  . Bactrim [Sulfamethoxazole-Trimethoprim] Other (See Comments)    Unknown  . Digoxin And Related Other (See Comments)    Didn't do well  . Hctz [Hydrochlorothiazide] Other (See Comments)    Unknown  . Lunesta [Eszopiclone] Other (See Comments)    Dizzy    Physical Exam:  Temp:  [97.5 F (36.4 C)-98 F (36.7 C)] 98 F (36.7 C) (07/26 1357) Pulse Rate:  [59-69] 69 (07/26 1357) Resp:  [16-18] 18 (07/26 1357) BP: (121-151)/(43-54) 127/47 (07/26 1357) SpO2:  [97 %-100 %] 98 % (07/26 1357) Weight:  [67 kg (147 lb 12.8 oz)-70.8 kg (156 lb)] 70.8 kg (156 lb) (07/26 0500)   General Appearance:  Well developed, well nourished, no acute distress, alert and oriented, cooperative and appears stated age 59:  Normocephalic atraumatic, extraocular movements intact, moist mucous membranes, neck supple with midline trachea and thyroid without masses Cardiovascular:  Normal S1/S2, regular rate and rhythm, no murmurs, 2+ distal pulses Pulmonary:  clear to auscultation, no wheezes, rales or rhonchi, symmetric air entry, good  air exchange Abdomen:  Bowel sounds present, soft, nontender, nondistended, no abnormal masses or organomegaly, no epigastric pain Back: inspection of back is normal Extremities:  extremities normal, no tenderness, atraumatic, no cyanosis or edema Skin:  normal coloration and turgor, no rashes, no suspicious skin lesions noted  Neurologic:  Cranial nerves 2-12 grossly intact, grossly equal strength and muscle tone, normal speech, no focal findings or movement disorder noted. Psychiatric:  Normal mood and affect, appropriate, no AH/VH Pelvic:  NEFG, no vulvar masses or lesions, normal vaginal mucosa, no vaginal bleeding or discharge, vagina shortened, rectovaginal exam without nodularity, no discreet masses, no blood on glove.   Labs/Studies:   Results for orders placed or performed during the hospital encounter of 01/02/17 (from the past 48 hour(s))  TSH     Status: Abnormal   Collection Time: 01/02/17  6:53 PM  Result Value Ref Range   TSH 9.127 (H) 0.350 - 4.500 uIU/mL    Comment: Performed by a 3rd Generation assay with a functional sensitivity of <=0.01 uIU/mL.  Troponin I     Status: None   Collection Time: 01/02/17  6:53 PM  Result Value Ref Range   Troponin I <0.03 <0.03 ng/mL  Hemoglobin A1c     Status: None   Collection Time: 01/02/17  6:53 PM  Result Value Ref Range   Hgb A1c MFr Bld 5.4 4.8 - 5.6 %    Comment: (NOTE)         Pre-diabetes: 5.7 - 6.4         Diabetes: >6.4         Glycemic control for adults with diabetes: <7.0    Mean Plasma Glucose 108 mg/dL    Comment: (NOTE) Performed At: [facility] [place], [place] [id number] Lindon Romp MD IO:[id number]   Basic metabolic panel     Status: Abnormal   Collection Time: 01/02/17  6:53 PM  Result Value Ref Range   Sodium 132 (L) 135 - 145 mmol/L   Potassium 4.3  3.5 - 5.1 mmol/L   Chloride 99 (L) 101 - 111 mmol/L   CO2 26 22 - 32 mmol/L   Glucose, Bld 108 (H) 65 - 99 mg/dL   BUN 16 6  - 20 mg/dL   Creatinine, Ser 1.02 (H) 0.44 - 1.00 mg/dL   Calcium 8.4 (L) 8.9 - 10.3 mg/dL   GFR calc non Af Amer 46 (L) >60 mL/min   GFR calc Af Amer 53 (L) >60 mL/min    Comment: (NOTE) The eGFR has been calculated using the CKD EPI equation. This calculation has not been validated in all clinical situations. eGFR's persistently <60 mL/min signify possible Chronic Kidney Disease.    Anion gap 7 5 - 15  Troponin I     Status: None   Collection Time: 01/03/17 12:36 AM  Result Value Ref Range   Troponin I <0.03 <0.03 ng/mL  Urinalysis, Complete w Microscopic     Status: Abnormal   Collection Time: 01/03/17  2:04 AM  Result Value Ref Range   Color, Urine YELLOW (A) YELLOW   APPearance CLOUDY (A) CLEAR   Specific Gravity, Urine 1.004 (L) 1.005 - 1.030   pH 7.0 5.0 - 8.0   Glucose, UA NEGATIVE NEGATIVE mg/dL   Hgb urine dipstick MODERATE (A) NEGATIVE   Bilirubin Urine NEGATIVE NEGATIVE   Ketones, ur NEGATIVE NEGATIVE mg/dL   Protein, ur NEGATIVE NEGATIVE mg/dL   Nitrite NEGATIVE NEGATIVE   Leukocytes, UA MODERATE (A) NEGATIVE   RBC / HPF 6-30 0 - 5 RBC/hpf   WBC, UA TOO NUMEROUS TO COUNT 0 - 5 WBC/hpf   Bacteria, UA MANY (A) NONE SEEN   Squamous Epithelial / LPF 0-5 (A) NONE SEEN   WBC Clumps PRESENT   Troponin I     Status: None   Collection Time: 01/03/17  6:39 AM  Result Value Ref Range   Troponin I <0.03 <0.03 ng/mL  Basic metabolic panel     Status: Abnormal   Collection Time: 01/03/17  6:39 AM  Result Value Ref Range   Sodium 134 (L) 135 - 145 mmol/L   Potassium 3.6 3.5 - 5.1 mmol/L   Chloride 98 (L) 101 - 111 mmol/L   CO2 28 22 - 32 mmol/L   Glucose, Bld 108 (H) 65 - 99 mg/dL   BUN 14 6 - 20 mg/dL   Creatinine, Ser 1.07 (H) 0.44 - 1.00 mg/dL   Calcium 8.0 (L) 8.9 - 10.3 mg/dL   GFR calc non Af Amer 43 (L) >60 mL/min   GFR calc Af Amer 50 (L) >60 mL/min    Comment: (NOTE) The eGFR has been calculated using the CKD EPI equation. This calculation has not been  validated in all clinical situations. eGFR's persistently <60 mL/min signify possible Chronic Kidney Disease.    Anion gap 8 5 - 15  CBC     Status: Abnormal   Collection Time: 01/03/17  6:39 AM  Result Value Ref Range   WBC 10.8 3.6 - 11.0 K/uL   RBC 2.62 (L) 3.80 - 5.20 MIL/uL   Hemoglobin 7.5 (L) 12.0 - 16.0 g/dL   HCT 22.3 (L) 35.0 - 47.0 %   MCV 85.2 80.0 - 100.0 fL   MCH 28.5 26.0 - 34.0 pg   MCHC 33.4 32.0 - 36.0 g/dL   RDW 15.3 (H) 11.5 - 14.5 %   Platelets 278 150 - 440 K/uL  ABO/Rh     Status: None   Collection Time: 01/03/17  6:39 AM  Result Value Ref Range   ABO/RH(D) O NEG   Glucose, capillary     Status: Abnormal   Collection Time: 01/03/17  7:43 AM  Result Value Ref Range   Glucose-Capillary 102 (H) 65 - 99 mg/dL  Prepare RBC     Status: None   Collection Time: 01/03/17 11:30 AM  Result Value Ref Range   Order Confirmation ORDER PROCESSED BY BLOOD BANK     : Ct Abdomen Pelvis Wo Contrast  Result Date: 01/02/2017 CLINICAL DATA:  Anemia and rectal bleeding, unknown blood thinners EXAM: CT ABDOMEN AND PELVIS WITHOUT CONTRAST TECHNIQUE: Multidetector CT imaging of the abdomen and pelvis was performed following the standard protocol without IV contrast. COMPARISON:  07/03/2016 FINDINGS: Lower chest: Bilateral pleural effusions right greater than left. Mild right lower lobe atelectasis is seen. Hepatobiliary: No focal liver abnormality is seen. Status post cholecystectomy. No biliary dilatation. Pancreas: Mild pancreatic atrophy is seen with fatty interdigitation. No focal mass is seen. Spleen: Normal in size without focal abnormality. Adrenals/Urinary Tract: The adrenal glands are within normal limits. Renal vascular calcifications are noted. No obstructive changes are seen. No renal or ureteral stones are noted. The bladder is partially distended. Stomach/Bowel: Scattered diverticular change of the colon is noted. Postsurgical changes in the sigmoid colon are seen. No  definitive inflammatory change is noted. Changes of prior right colonic surgery are noted. No obstructive changes or inflammatory changes are seen. Vascular/Lymphatic: Aortic atherosclerosis. No enlarged abdominal or pelvic lymph nodes. Reproductive: Status post hysterectomy. No adnexal masses. Persistent soft tissue fullness is noted in the vaginal wall similar to that seen on the prior exam. This is again suspicious for recurrence. Blurring of the perirectal fat plane is noted distally secondary to these changes. Some direct in growth would be difficult to exclude. These changes are stable from the prior study. Other: Minimal free fluid is again seen within the pelvis stable from the prior exam. Musculoskeletal: Degenerative changes of the lumbar spine are seen. Mild osteopenia is noted. Stable sclerosis of S1 is noted superiorly. IMPRESSION: Persistent abnormal soft tissue thickness in the region of the vagina suspicious for recurrence of carcinoma. There is blurring of the fat plane between the vaginal vault and the adjacent rectum similar to that seen on the prior exam also suspicious for localized invasion. Correlation with the physical exam is recommended. Bilateral pleural effusions right greater than left with right lower lobe atelectasis. Chronic changes as described above. Electronically Signed   By: Inez Catalina M.D.   On: 01/02/2017 18:18   US Venous Img Lower Bilateral  Result Date: 01/02/2017 CLINICAL DATA:  Lower extremity edema EXAM: BILATERAL LOWER EXTREMITY VENOUS DOPPLER ULTRASOUND TECHNIQUE: Gray-scale sonography with graded compression, as well as color Doppler and duplex ultrasound were performed to evaluate the lower extremity deep venous systems from the level of the common femoral vein and including the common femoral, femoral, profunda femoral, popliteal and calf veins including the posterior tibial, peroneal and gastrocnemius veins when visible. The superficial great saphenous vein  was also interrogated. Spectral Doppler was utilized to evaluate flow at rest and with distal augmentation maneuvers in the common femoral, femoral and popliteal veins. COMPARISON:  03/14/2016 FINDINGS: RIGHT LOWER EXTREMITY Common Femoral Vein: No evidence of thrombus. Normal compressibility, respiratory phasicity and response to augmentation. Saphenofemoral Junction: No evidence of thrombus. Normal compressibility and flow on color Doppler imaging. Profunda Femoral Vein: No evidence of thrombus. Normal compressibility and flow on color Doppler imaging. Femoral Vein: No evidence of thrombus.  Normal compressibility, respiratory phasicity and response to augmentation. Popliteal Vein: No evidence of thrombus. Normal compressibility, respiratory phasicity and response to augmentation. Calf Veins: Difficult to visualize. Other Findings:  Subcutaneous edema present. LEFT LOWER EXTREMITY Common Femoral Vein: No evidence of thrombus. Normal compressibility, respiratory phasicity and response to augmentation. Saphenofemoral Junction: No evidence of thrombus. Normal compressibility and flow on color Doppler imaging. Profunda Femoral Vein: No evidence of thrombus. Normal compressibility and flow on color Doppler imaging. Femoral Vein: No evidence of thrombus. Normal compressibility, respiratory phasicity and response to augmentation. Popliteal Vein: No evidence of thrombus. Normal compressibility, respiratory phasicity and response to augmentation. Calf Veins: Difficult to visualize. Other Findings:  Subcutaneous edema present IMPRESSION: No evidence of DVT within either lower extremity. Difficult visualization of calf veins due to the presence of edema Electronically Signed   By: Donavan Foil M.D.   On: 01/02/2017 20:44   Dg Chest Port 1 View  Result Date: 01/02/2017 CLINICAL DATA:  Shortness of breath. EXAM: PORTABLE CHEST 1 VIEW COMPARISON:  Radiographs of May 25, 2014. FINDINGS: Stable cardiomegaly. Left-sided  pacemaker is unchanged in position. No pneumothorax or pleural effusion is noted. No acute pulmonary disease is noted. Bony thorax is unremarkable. IMPRESSION: No acute cardiopulmonary abnormality seen. Electronically Signed   By: Marijo Conception, M.D.   On: 01/02/2017 19:02     Assessment / Plan:   Sheenah I Chanthavong is a 83 y.o. with long ago history of uterine cancer s/p hysterectomy and RT, with stable perirectal soft tissue mass and rectal bleeding  1. Vaginal exam shows shortened vagina due to surgery and Radiation, no blood or area of excoriation in vagina or on vulva. 2. Soft tissue mass has been stable for years, unlikely the cause of her bleeding nor a new development of recurrence. 3. I suspect that she has a bleed from GI tract, exacerbated by her Eliquis.  This has been held and the bleeding has subsided, per her daughter.   4. Will sign off - please notify me if there are any other questions or concerns.   Thank you for the opportunity to be involved with this lovely patient's care.  ----- Larey Days, MD Attending Obstetrician and Gynecologist [facility], Department of OB/GYN [facility]  30 minutes were spent caring for this patient - 20 mins face to face, in consult and exam, and 10 minutes researching her chart and writing this note.

## 2017-01-03 NOTE — Progress Notes (Signed)
No s/s bleeding overnight. VSS, alert. Hgb 7.5 this morning.  Patient and daughter at bedside updated on current plan of care.  Waiting for Dr. Darvin Neighbours to round, no GI coverage again today, and OBGYN consult.

## 2017-01-03 NOTE — Evaluation (Signed)
Physical Therapy Evaluation Patient Details Name: Kathryn Short MRN: [id number] DOB: 05-06-1922 Today's Date: 01/03/2017   History of Present Illness  Pt is a 83 y.o. F who presented 01/02/2017 with fatigue, SOB, acute anemia and reports of blood in her stool (12/29/2016). Pt admitted with GI bleed. PMH: pacemaker, remote uterine cancer (1952), a-fib, bowel obstruction, CHF, HTN, hypothyroidism, hx of fall, melanoma (removed), chronic iron defficiency, cirrhosis of the liver, and soft tissue mass in vaginal area (since January 2018).    Clinical Impression  Prior to admission pt and family member report use of 4 wheel RW for household ambulation and use of SPC and hand held assist for public ambulation; pt required assist for bathing, dressing. Family available 24 hours per day upon discharge. Pt not oriented to place, situation, time; alert to person/self; baseline according to daughter but slightly worse since onset of anemia. Pt currently requires CGA and RW for transfers and short ambulation to bedside chair (further ambulation held due to BP 139/41 and reported minimal dizziness/lightheadness during sit to stand); pt steady and safe with RW and CGA. Pt denies pain throughout session. Pt will continue to benefit from skilled PT to address strength, ambulation and functional mobility deficits. Recommended discharge to home with HHPT.    Follow Up Recommendations Home health PT    Equipment Recommendations  Rolling walker with 5" wheels    Recommendations for Other Services       Precautions / Restrictions Precautions Precautions: Fall;ICD/Pacemaker      Mobility  Bed Mobility Overal bed mobility: Modified Independent Bed Mobility: Supine to Sit     Supine to sit: Modified independent (Device/Increase time);HOB elevated     General bed mobility comments: pt able to perform supine to sit with use of bed rails and HOB elevated with increased time/effort noted  Transfers Overall  transfer level: Needs assistance Equipment used: Rolling walker (2 wheeled) Transfers: Sit to/from Stand Sit to Stand: Min guard         General transfer comment: pt able to perform sit to stand with CGA and RW for safety; vc's for proper B LE and B UE placement for sit to stand (needs reinforcement)  Ambulation/Gait Ambulation/Gait assistance: Min guard   Assistive device: Rolling walker (2 wheeled)   Gait velocity: decreased   General Gait Details: pt able to ambulate a few small steps within room to get to bedside chair  Stairs            Wheelchair Mobility    Modified Rankin (Stroke Patients Only)       Balance Overall balance assessment: Needs assistance Sitting-balance support: Feet supported;Single extremity supported Sitting balance-Leahy Scale: Fair Sitting balance - Comments: pt able to perform static sitting balance with L UE support with no overt loss of balance noted; pt able to reach within base of support to comb hair with R UE while maintaining appropriate sitting balance and posture   Standing balance support: Bilateral upper extremity supported Standing balance-Leahy Scale: Fair Standing balance comment: pt able to perform static standing balance and short ambulation to bedside chair with CGA and RW with no overt loss of balance noted                             Pertinent Vitals/Pain Pain Assessment: No/denies pain  HR - 62 to 66 bpm throughout session via telemetry O2 - 95 to 98% on RA throughout session via pulse ox BP -  139/41 mmHg via [place] expects to be discharged to:: Private residence Living Arrangements: Children Available Help at Discharge: Family;Available 24 hours/day Type of Home: House Home Access: Ramped entrance     Home Layout: One level Home Equipment: Walker - 4 wheels;Cane - quad;Cane - single point;Bedside commode;Shower seat - built in;Toilet riser;Grab bars - toilet       Prior Function Level of Independence: Needs assistance   Gait / Transfers Assistance Needed: per pt's daughter pt was modified independent with use of 4 wheel RW for transfers  ADL's / Homemaking Assistance Needed: per pt's daughter pt required assistance for bathing, dressing, and other ADLs but was modified independent with ambulation and functional mobility with 4 wheel RW; occassionally used SPC and hand held assist for public ambulation        Hand Dominance        Extremity/Trunk Assessment   Upper Extremity Assessment Upper Extremity Assessment: Overall WFL for tasks assessed    Lower Extremity Assessment Lower Extremity Assessment: Generalized weakness;RLE deficits/detail;LLE deficits/detail RLE Deficits / Details: at least 3/5 grossly for R ankle DF, R knee flexion/extension, R hip flexion LLE Deficits / Details: at least 3/5 grossly for L ankle DF, L knee flexion/extension, L hip flexion    Cervical / Trunk Assessment Cervical / Trunk Assessment: Normal  Communication   Communication: HOH  Cognition Arousal/Alertness: Awake/alert Behavior During Therapy: WFL for tasks assessed/performed Overall Cognitive Status: History of cognitive impairments - at baseline                                 General Comments: per pt's daughter history of cognitive deficits at baseline worsening recently with anemia; pt oriented to person/self; pt not oriented to place, time, situation      General Comments      Exercises     Assessment/Plan    PT Assessment Patient needs continued PT services  PT Problem List Decreased strength;Decreased activity tolerance;Decreased balance;Decreased mobility;Decreased cognition;Decreased knowledge of use of DME;Decreased knowledge of precautions       PT Treatment Interventions DME instruction;Gait training;Functional mobility training;Therapeutic activities;Therapeutic exercise;Balance training;Patient/family education     PT Goals (Current goals can be found in the Care Plan section)  Acute Rehab PT Goals Patient Stated Goal: to return home PT Goal Formulation: With patient/family Time For Goal Achievement: 01/17/17 Potential to Achieve Goals: Good    Frequency Min 2X/week   Barriers to discharge        Co-evaluation               AM-PAC PT "6 Clicks" Daily Activity  Outcome Measure Difficulty turning over in bed (including adjusting bedclothes, sheets and blankets)?: A Little Difficulty moving from lying on back to sitting on the side of the bed? : A Little Difficulty sitting down on and standing up from a chair with arms (e.g., wheelchair, bedside commode, etc,.)?: A Little Help needed moving to and from a bed to chair (including a wheelchair)?: A Little Help needed walking in hospital room?: A Little Help needed climbing 3-5 steps with a railing? : A Lot 6 Click Score: 17    End of Session Equipment Utilized During Treatment: Gait belt Activity Tolerance: Patient limited by fatigue Patient left: in chair;with call bell/phone within reach;with chair alarm set;with family/visitor present;with SCD's reapplied (SCD's in place and activated) Nurse Communication: Mobility status;Precautions (via whiteboard) PT Visit Diagnosis: Other  abnormalities of gait and mobility (R26.89);Muscle weakness (generalized) (M62.81);History of falling (Z91.81)    Time: 4834-7583 PT Time Calculation (min) (ACUTE ONLY): 39 min   Charges:         PT G CodesLaqueta Carina, SPT 2017-02-02,12:12 PM [phone]

## 2017-01-03 NOTE — Progress Notes (Signed)
[facility] [place] NAME: Janah Mcculloh    MR#:  [id number]  DATE OF BIRTH:  01/01/1922  SUBJECTIVE:  CHIEF COMPLAINT:  No chief complaint on file.  Patient has no concerns. Laying in bed. Decreased hearing. History obtained from daughter due to patient's dementia.  REVIEW OF SYSTEMS:    Review of Systems  Unable to perform ROS: Dementia    DRUG ALLERGIES:   Allergies  Allergen Reactions  . Ace Inhibitors Other (See Comments)    Unknown  . Ambien [Zolpidem Tartrate] Other (See Comments)    Dizzy  . Bactrim [Sulfamethoxazole-Trimethoprim] Other (See Comments)    Unknown  . Digoxin And Related Other (See Comments)    Didn't do well  . Hctz [Hydrochlorothiazide] Other (See Comments)    Unknown  . Lunesta [Eszopiclone] Other (See Comments)    Dizzy    VITALS:  Blood pressure (!) 121/43, pulse 61, temperature (!) 97.5 F (36.4 C), temperature source Oral, resp. rate 16, height 5\' 2"  (1.575 m), weight 70.8 kg (156 lb), SpO2 98 %.  PHYSICAL EXAMINATION:   Physical Exam  GENERAL:  83 y.o.-year-old patient lying in the bed with no acute distress. Decreased hearing EYES: Pupils equal, round, reactive to light and accommodation. No scleral icterus. Extraocular muscles intact. Pale HEENT: Head atraumatic, normocephalic. Oropharynx and nasopharynx clear.  NECK:  Supple, no jugular venous distention. No thyroid enlargement, no tenderness.  LUNGS: Normal breath sounds bilaterally, no wheezing, rales, rhonchi. No use of accessory muscles of respiration.  CARDIOVASCULAR: S1, S2 normal. No murmurs, rubs, or gallops.  ABDOMEN: Soft, nontender, nondistended. Bowel sounds present. No organomegaly or mass.  EXTREMITIES: No cyanosis, clubbing or edema b/l.    NEUROLOGIC: Cranial nerves II through XII are intact. No focal Motor or sensory deficits b/l.   PSYCHIATRIC: The patient is alert and awake SKIN: No obvious rash, lesion, or ulcer.    LABORATORY PANEL:   CBC  Recent Labs Lab 01/03/17 0639  WBC 10.8  HGB 7.5*  HCT 22.3*  PLT 278   ------------------------------------------------------------------------------------------------------------------ Chemistries   Recent Labs Lab 01/02/17 1456  01/03/17 0639  NA  --   < > 134*  K  --   < > 3.6  CL  --   < > 98*  CO2  --   < > 28  GLUCOSE  --   < > 108*  BUN  --   < > 14  CREATININE  --   < > 1.07*  CALCIUM  --   < > 8.0*  AST 75*  --   --   ALT 48  --   --   ALKPHOS 90  --   --   BILITOT 0.5  --   --   < > = values in this interval not displayed. ------------------------------------------------------------------------------------------------------------------  Cardiac Enzymes  Recent Labs Lab 01/03/17 0639  TROPONINI <0.03   ------------------------------------------------------------------------------------------------------------------  RADIOLOGY:  Ct Abdomen Pelvis Wo Contrast  Result Date: 01/02/2017 CLINICAL DATA:  Anemia and rectal bleeding, unknown blood thinners EXAM: CT ABDOMEN AND PELVIS WITHOUT CONTRAST TECHNIQUE: Multidetector CT imaging of the abdomen and pelvis was performed following the standard protocol without IV contrast. COMPARISON:  07/03/2016 FINDINGS: Lower chest: Bilateral pleural effusions right greater than left. Mild right lower lobe atelectasis is seen. Hepatobiliary: No focal liver abnormality is seen. Status post cholecystectomy. No biliary dilatation. Pancreas: Mild pancreatic atrophy is seen with fatty interdigitation. No focal mass is seen. Spleen: Normal in  size without focal abnormality. Adrenals/Urinary Tract: The adrenal glands are within normal limits. Renal vascular calcifications are noted. No obstructive changes are seen. No renal or ureteral stones are noted. The bladder is partially distended. Stomach/Bowel: Scattered diverticular change of the colon is noted. Postsurgical changes in the sigmoid colon are seen.  No definitive inflammatory change is noted. Changes of prior right colonic surgery are noted. No obstructive changes or inflammatory changes are seen. Vascular/Lymphatic: Aortic atherosclerosis. No enlarged abdominal or pelvic lymph nodes. Reproductive: Status post hysterectomy. No adnexal masses. Persistent soft tissue fullness is noted in the vaginal wall similar to that seen on the prior exam. This is again suspicious for recurrence. Blurring of the perirectal fat plane is noted distally secondary to these changes. Some direct in growth would be difficult to exclude. These changes are stable from the prior study. Other: Minimal free fluid is again seen within the pelvis stable from the prior exam. Musculoskeletal: Degenerative changes of the lumbar spine are seen. Mild osteopenia is noted. Stable sclerosis of S1 is noted superiorly. IMPRESSION: Persistent abnormal soft tissue thickness in the region of the vagina suspicious for recurrence of carcinoma. There is blurring of the fat plane between the vaginal vault and the adjacent rectum similar to that seen on the prior exam also suspicious for localized invasion. Correlation with the physical exam is recommended. Bilateral pleural effusions right greater than left with right lower lobe atelectasis. Chronic changes as described above. Electronically Signed   By: Inez Catalina M.D.   On: 01/02/2017 18:18   US Venous Img Lower Bilateral  Result Date: 01/02/2017 CLINICAL DATA:  Lower extremity edema EXAM: BILATERAL LOWER EXTREMITY VENOUS DOPPLER ULTRASOUND TECHNIQUE: Gray-scale sonography with graded compression, as well as color Doppler and duplex ultrasound were performed to evaluate the lower extremity deep venous systems from the level of the common femoral vein and including the common femoral, femoral, profunda femoral, popliteal and calf veins including the posterior tibial, peroneal and gastrocnemius veins when visible. The superficial great saphenous vein  was also interrogated. Spectral Doppler was utilized to evaluate flow at rest and with distal augmentation maneuvers in the common femoral, femoral and popliteal veins. COMPARISON:  03/14/2016 FINDINGS: RIGHT LOWER EXTREMITY Common Femoral Vein: No evidence of thrombus. Normal compressibility, respiratory phasicity and response to augmentation. Saphenofemoral Junction: No evidence of thrombus. Normal compressibility and flow on color Doppler imaging. Profunda Femoral Vein: No evidence of thrombus. Normal compressibility and flow on color Doppler imaging. Femoral Vein: No evidence of thrombus. Normal compressibility, respiratory phasicity and response to augmentation. Popliteal Vein: No evidence of thrombus. Normal compressibility, respiratory phasicity and response to augmentation. Calf Veins: Difficult to visualize. Other Findings:  Subcutaneous edema present. LEFT LOWER EXTREMITY Common Femoral Vein: No evidence of thrombus. Normal compressibility, respiratory phasicity and response to augmentation. Saphenofemoral Junction: No evidence of thrombus. Normal compressibility and flow on color Doppler imaging. Profunda Femoral Vein: No evidence of thrombus. Normal compressibility and flow on color Doppler imaging. Femoral Vein: No evidence of thrombus. Normal compressibility, respiratory phasicity and response to augmentation. Popliteal Vein: No evidence of thrombus. Normal compressibility, respiratory phasicity and response to augmentation. Calf Veins: Difficult to visualize. Other Findings:  Subcutaneous edema present IMPRESSION: No evidence of DVT within either lower extremity. Difficult visualization of calf veins due to the presence of edema Electronically Signed   By: Donavan Foil M.D.   On: 01/02/2017 20:44   Dg Chest Port 1 View  Result Date: 01/02/2017 CLINICAL DATA:  Shortness of breath.  EXAM: PORTABLE CHEST 1 VIEW COMPARISON:  Radiographs of May 25, 2014. FINDINGS: Stable cardiomegaly. Left-sided  pacemaker is unchanged in position. No pneumothorax or pleural effusion is noted. No acute pulmonary disease is noted. Bony thorax is unremarkable. IMPRESSION: No acute cardiopulmonary abnormality seen. Electronically Signed   By: Marijo Conception, M.D.   On: 01/02/2017 19:02     ASSESSMENT AND PLAN:   # Lower GI bleed with acute blood loss anemia Transfuse 1 unit of packed RBC. Likely from the invasive vaginal mass. Monitor hemoglobin.  # vaginal mass Gynecology consulted.  # Acute on chronic diastolic CHF Continue IV Lasix now that patient is getting blood transfusion. Monitor input and output.  # Dementia. Watch for inpatient delirium.  Consult palliative care for goals of care.  All the records are reviewed and case discussed with Care Management/Social Worker Management plans discussed with the patient, family and they are in agreement.  CODE STATUS: FULL CODE  DVT Prophylaxis: SCDs  TOTAL TIME TAKING CARE OF THIS PATIENT: 35 minutes.   POSSIBLE D/C IN 1-2 DAYS, DEPENDING ON CLINICAL CONDITION.  Hillary Bow R M.D on 01/03/2017 at 1:17 PM  Between 7am to 6pm - Pager - [phone]  After 6pm go to [email or web address] - password [email or web address] [facility]  Office  [phone]  CC: Primary care physician; Tracie Harrier, MD  Note: This dictation was prepared with Dragon dictation along with smaller phrase technology. Any transcriptional errors that result from this process are unintentional.

## 2017-01-03 NOTE — Consult Note (Signed)
Consultation Note Date: 01/03/2017   Patient Name: Kathryn Short  DOB: 07-20-1921  MRN: [id number]  Age / Sex: 83 y.o., female  PCP: Tracie Harrier, MD Referring Physician: Hillary Bow, MD  Reason for Consultation: Establishing goals of care  HPI/Patient Profile: 83 y.o. female  with past medical history of uterine cancer, hypothyroidism, CHF, afib on eliquis, dementia, HTN, GERD, anemia, and cirrhosis admitted on 01/02/2017 from oncology office with weakness and shortness of breath. Patient has a known soft tissue mass in vaginal area since January 2018. She has been anemic and with bright red blood in stool/dark blood clots. Recently treated for UTI. Admitted for GIB. Hgb 7.5 and receiving 1 unit PRBC today. Also with acute on chronic diastolic CHF receiving lasix. Baseline dementia. GYN consult pending. Palliative medicine consultation for goals of care.   Clinical Assessment and Goals of Care: I have reviewed medical records, discussed with care team, and met with patient and daughter Kathryn Short) to discuss diagnosis, [place], EOL wishes, disposition and options.  Introduced Palliative Medicine as specialized medical care for people living with serious illness. It focuses on providing relief from the symptoms and stress of a serious illness. The goal is to improve quality of life for both the patient and the family.  We discussed a brief life review of the patient. She was married twice. Both husbands have passed. Five adult children. Lives with daughter, Kathryn Short. Daughter is with her 24/7. Prior to hospitalization, patient fairly independent but with increased weakness x1 month. She is able to ambulate with cane or walker. Requires assist with ADL's. Decreased appetite. Daughter states "mild dementia." Daughter speaks of her having uterine cancer when she was 83 years old. "She is a survivor." Vaginal mass  found in January 2018 with no necessary intervention.   Discussed hospital diagnoses and interventions. GYN and GI consults pending.    I attempted to elicit values and goals of care important to the patient and daughter. Kathryn Short will keep her at home and continue to care for her. She will keep her out of a nursing facility.   Advanced directives, concepts specific to code status, and artifical feeding and hydration were discussed. Kathryn Short is documented HCPOA. Document reviewed in epic. In regards to resuscitation and life support, the patient wants her daughter to make all medical decisions for her. Kathryn Short would want resuscitation attempted and short term intubation. She would NOT want long term intubation or a feeding tube. Educated on medical recommendation for DNR/DNI with age and chronic co-morbidities. Provided Hard Choices copy for daughter to review and encouraged her to consider EOL wishes as co-morbidities continue to progress.   Questions and concerns were addressed.  Hard Choices booklet left for review. Therapeutic listening and emotional/spiritual support provided.    SUMMARY OF RECOMMENDATIONS    FULL code/FULL scope treatment. Patient asks for daughter to make all medical decisions.  Encouraged daughter to consider code status and what her wishes would be at EOL.   GYN/GI consults pending.  Discharge plan is home with home health. May benefit from palliative services to follow on discharge to continue supportive care and [place] conversations.    Code Status/Advance Care Planning:  Full code-Educated on medical recommendation for DNR/DNI with age, co-morbidities, and poor chance of meaningful recovery if she survived.   Symptom Management:   Per attending  Palliative Prophylaxis:   Aspiration, Delirium Protocol, Frequent Pain Assessment, Oral Care and Turn Reposition  Additional Recommendations (Limitations, Scope, Preferences):  Full Scope  Treatment  Psycho-social/Spiritual:   Desire for further Chaplaincy support:yes  Additional Recommendations: Caregiving  Support/Resources and Education on Hospice  Prognosis:   Unable to determine  Discharge Planning: Home with Home Health      Primary Diagnoses: Present on Admission: **None**   I have reviewed the medical record, interviewed the patient and family, and examined the patient. The following aspects are pertinent.  Past Medical History:  Diagnosis Date  . Atrial fibrillation ([place])   . Bowel obstruction ([place])   . CHF (congestive heart failure) ([place])   . Fall   . GERD (gastroesophageal reflux disease)   . Hypertension   . Hypothyroidism    Treated  . Melanoma ([place])    Resected from back  . OSA (obstructive sleep apnea)    On BiPAP  . Uterine cancer [facility])    s/p radiation therapy in 1952   Social History   Social History  . Marital status: Widowed    Spouse name: N/A  . Number of children: 5  . Years of education: N/A   Occupational History  .  Retired   Social History Main Topics  . Smoking status: Never Smoker  . Smokeless tobacco: Never Used     Comment: Tobacco use-no  . Alcohol use No  . Drug use: No  . Sexual activity: Not on file   Other Topics Concern  . Not on file   Social History Narrative  . No narrative on file   Family History  Problem Relation Age of Onset  . Family history unknown: Yes   Scheduled Meds: . acidophilus  1 capsule Oral Daily  . amiodarone  200 mg Oral Daily  . furosemide  40 mg Intravenous Once  . levothyroxine  50 mcg Oral Once per day on Mon Tue Wed Thu Fri  . [START ON 01/05/2017] levothyroxine  75 mcg Oral Once per day on Sun Sat  . mometasone-formoterol  2 puff Inhalation BID  . montelukast  10 mg Oral QHS  . multivitamin-lutein  2 capsule Oral BID  . pantoprazole  40 mg Oral BID  . polyvinyl alcohol  1 drop Both Eyes BID  . sertraline  50 mg Oral QHS  . sodium chloride flush  3 mL  Intravenous Q12H   Continuous Infusions: . sodium chloride    . cefTRIAXone (ROCEPHIN)  IV     PRN Meds:.sodium chloride, acetaminophen, albuterol, clonazePAM, ondansetron **OR** ondansetron (ZOFRAN) IV, sodium chloride flush Medications Prior to Admission:  Prior to Admission medications   Medication Sig Start Date End Date Taking? Authorizing Provider  acetaminophen (TYLENOL) 500 MG tablet Take 1,000 mg by mouth daily as needed for mild pain or headache.    Yes [provider]  albuterol (PROVENTIL HFA;VENTOLIN HFA) 108 (90 BASE) MCG/ACT inhaler Inhale 2 puffs into the lungs every 6 (six) hours as needed for wheezing or shortness of breath.    Yes [provider]  amiodarone (PACERONE) 200 MG tablet Take 1 tablet (200 mg total) by mouth  daily. 01/18/16  Yes Rise Mu, PA-C  b complex vitamins tablet Take 1 tablet by mouth daily.   Yes [provider]  Carboxymethylcellul-Glycerin (LUBRICATING EYE DROPS OP) Place 2 drops into both eyes 2 (two) times daily.   Yes [provider]  clonazePAM (KLONOPIN) 0.5 MG tablet Take 0.5 mg by mouth 2 (two) times daily as needed for anxiety.   Yes [provider]  cloNIDine (CATAPRES) 0.1 MG tablet Take 1 tablet (0.1 mg total) by mouth as needed. 07/30/16  Yes Gollan, Kathlene November, MD  lactulose (CHRONULAC) 10 GM/15ML solution Take 30 g by mouth daily.  05/31/14  Yes [provider]  levothyroxine (SYNTHROID, LEVOTHROID) 50 MCG tablet Take 50 mcg by mouth daily. 75 mcg on Saturday and Sunday   Yes [provider]  losartan (COZAAR) 100 MG tablet Take 1 tablet (100 mg total) by mouth daily as needed. 07/30/16  Yes Minna Merritts, MD  metoprolol (LOPRESSOR) 50 MG tablet Take 1 tablet (50 mg total) by mouth 2 (two) times daily. 07/30/16  Yes Gollan, Kathlene November, MD  montelukast (SINGULAIR) 10 MG tablet Take 10 mg by mouth at bedtime. 10/14/14  Yes [provider]  Multiple Vitamin (MULTIVITAMIN)  tablet Take 1 tablet by mouth daily.   Yes [provider]  Multiple Vitamins-Minerals (PRESERVISION AREDS 2 PO) Take by mouth 2 (two) times daily.   Yes [provider]  nitroGLYCERIN (NITROSTAT) 0.4 MG SL tablet Place 1 tablet (0.4 mg total) under the tongue every 5 (five) minutes as needed for chest pain. 07/30/16  Yes Gollan, Kathlene November, MD  omeprazole (PRILOSEC) 20 MG capsule Take 20 mg by mouth 2 (two) times daily before a meal.   Yes [provider]  potassium chloride (K-DUR,KLOR-CON) 10 MEQ tablet Take 1 tablet (10 mEq total) by mouth daily as needed. Patient taking differently: Take 10 mEq by mouth daily.  03/28/15  Yes Minna Merritts, MD  sertraline (ZOLOFT) 50 MG tablet Take 50 mg by mouth daily.   Yes [provider]  spironolactone (ALDACTONE) 25 MG tablet Take 25 mg by mouth every other day.    Yes [provider]  torsemide (DEMADEX) 20 MG tablet Take 1 tablet (20 mg total) by mouth 2 (two) times daily as needed. 11/22/16  Yes Gollan, Kathlene November, MD  Vitamin D, Ergocalciferol, (DRISDOL) 50000 units CAPS capsule Take 50,000 Units by mouth every 7 (seven) days.   Yes [provider]  cefUROXime (CEFTIN) 250 MG tablet Take 250 mg by mouth daily as needed.     [provider]  iron polysaccharides (NIFEREX) 150 MG capsule Take 150 mg by mouth daily.    [provider]  mometasone-formoterol (DULERA) 100-5 MCG/ACT AERO Inhale 2 puffs into the lungs 2 (two) times daily as needed.     [provider]  nitrofurantoin (MACRODANTIN) 100 MG capsule Take 100 mg by mouth 2 (two) times daily.    [provider]  Probiotic Product (PROBIOTIC DAILY PO) Take 1 capsule by mouth daily.     [provider]   Allergies  Allergen Reactions  . Ace Inhibitors Other (See Comments)    Unknown  . Ambien [Zolpidem Tartrate] Other (See Comments)    Dizzy  . Bactrim [Sulfamethoxazole-Trimethoprim] Other (See  Comments)    Unknown  . Digoxin And Related Other (See Comments)    Didn't do well  . Hctz [Hydrochlorothiazide] Other (See Comments)    Unknown  . Johnnye Sima [  Eszopiclone] Other (See Comments)    Dizzy   Review of Systems  Unable to perform ROS: Dementia   Physical Exam  Constitutional: She is cooperative.  HENT:  Head: Normocephalic and atraumatic.  Cardiovascular: Regular rhythm.   Pulmonary/Chest: Effort normal.  Abdominal: Normal appearance. There is no tenderness.  Neurological: She is alert.  Pleasantly confused  Skin: Skin is warm and dry.  Psychiatric: She has a normal mood and affect. Her speech is delayed.  Nursing note and vitals reviewed.  Vital Signs: BP (!) 127/47   Pulse 69   Temp 98 F (36.7 C) (Oral)   Resp 18   Ht '5\' 2"'$  (1.575 m)   Wt 70.8 kg (156 lb)   SpO2 98%   BMI 28.53 kg/m  Pain Assessment: No/denies pain   Pain Score: 0-No pain  SpO2: SpO2: 98 % O2 Device:SpO2: 98 % O2 Flow Rate: .   IO: Intake/output summary:   Intake/Output Summary (Last 24 hours) at 01/03/17 1631 Last data filed at 01/03/17 1600  Gross per 24 hour  Intake             1290 ml  Output                0 ml  Net             1290 ml    LBM: Last BM Date: 01/03/17 Baseline Weight: Weight: 67 kg (147 lb 12.8 oz) Most recent weight: Weight: 70.8 kg (156 lb)     Palliative Assessment/Data: PPS 50%   Flowsheet Rows     Most Recent Value  Intake Tab  Referral Department  Hospitalist  Unit at Time of Referral  Oncology Unit  Palliative Care Primary Diagnosis  Other (Comment) [lower GIB, vaginal mass, CHF, dementia]  Palliative Care Type  New Palliative care  Reason for referral  Clarify Goals of Care  Date first seen by Palliative Care  01/03/17  Clinical Assessment  Palliative Performance Scale Score  50%  Psychosocial & Spiritual Assessment  Palliative Care Outcomes  Patient/Family meeting held?  Yes  Who was at the meeting?  patient and daughter  Palliative  Care Outcomes  Clarified goals of care, Provided psychosocial or spiritual support, ACP counseling assistance      Time In: 1350 Time Out: 1500 Time Total: 2mn Greater than 50%  of this time was spent counseling and coordinating care related to the above assessment and plan.  Signed by:  MIhor Dow FNP-C Palliative Medicine Team  Phone: [phone]: [phone]  Please contact Palliative Medicine Team phone at [phone] questions and concerns.  For individual provider: See AShea Evans

## 2017-01-03 NOTE — Progress Notes (Signed)
[facility] responded to an OR for an AD in 108. Pt was awake and pleasant. Daughter was bedside.  After a few minutes of conversation, [place] assessed that Education of the AD could not be completed at this time. Pt did not have an understanding of the material. Daughter concurred. [place] provided prayer and offered to follow up if the Pt become more clear.    01/03/17 1000  Clinical Encounter Type  Visited With Patient;Patient and family together  Visit Type Initial;Spiritual support  Referral From Nurse  Consult/Referral To Chaplain  Spiritual Encounters  Spiritual Needs Literature;Prayer

## 2017-01-04 ENCOUNTER — Encounter: Payer: Self-pay | Admitting: Internal Medicine

## 2017-01-04 LAB — BASIC METABOLIC PANEL
ANION GAP: 9 (ref 5–15)
BUN: 15 mg/dL (ref 6–20)
CHLORIDE: 98 mmol/L — AB (ref 101–111)
CO2: 28 mmol/L (ref 22–32)
Calcium: 8.3 mg/dL — ABNORMAL LOW (ref 8.9–10.3)
Creatinine, Ser: 1.09 mg/dL — ABNORMAL HIGH (ref 0.44–1.00)
GFR calc Af Amer: 49 mL/min — ABNORMAL LOW (ref >60)
GFR, EST NON AFRICAN AMERICAN: 42 mL/min — AB (ref >60)
GLUCOSE: 123 mg/dL — AB (ref 65–99)
POTASSIUM: 3.7 mmol/L (ref 3.5–5.1)
SODIUM: 135 mmol/L (ref 135–145)

## 2017-01-04 LAB — TYPE AND SCREEN
ABO/RH(D): O NEG
ANTIBODY SCREEN: NEGATIVE
UNIT DIVISION: 0

## 2017-01-04 LAB — BPAM RBC
Blood Product Expiration Date: 201808052359
ISSUE DATE / TIME: 201807261327
Unit Type and Rh: 9500

## 2017-01-04 LAB — HEMOGLOBIN: HEMOGLOBIN: 9.3 g/dL — AB (ref 12.0–16.0)

## 2017-01-04 LAB — GLUCOSE, CAPILLARY: GLUCOSE-CAPILLARY: 128 mg/dL — AB (ref 65–99)

## 2017-01-04 NOTE — Care Management Note (Signed)
Case Management Note  Patient Details  Name: Kathryn Short MRN: [id number] Date of Birth: March 02, 1922  Subjective/Objective:        Admitted to [facility] with the diagnosis of GI bleed. Lives with daughter, Kathryn Short [phone]) for 6-7 years.  Last seen Dr, Ginette Pitman 12/13/16. Prescriptions are filled at [facility]. Followed by [facility] in the home at present. No skilled nursing. No home oxygen. Rolling walker and walk-in shower in the home. Self feed. Daughter helps with baths and dressing. Last fall was 6 months ago. Lost 10-15 pounds in the last year.  Daughter will transport    Discharge to home today per Dr. Darvin Neighbours.           Action/Plan: Physical therapy evaluation completed. Home with Home Health and physical therapy, Would like to resume services per [facility].  Would not like palliative care in the home at this time.    Expected Discharge Date:  01/04/17               Expected Discharge Plan:     In-House Referral:     Discharge planning Services     Post Acute Care Choice:    Choice offered to:     DME Arranged:    DME Agency:     HH Arranged:   yes [place] Agency:   [facility]  Status of Service:     If discussed at [place] of Stay Meetings, dates discussed:    Additional Comments:  Shelbie Ammons, RN MSN [place]  [phone] 01/04/2017, 9:41 AM

## 2017-01-04 NOTE — Progress Notes (Signed)
Discussed discharge instructions and medications with pt and her daughter. IV removed. All questions addressed. Pt transported home via car by her daughter.  Clarise Cruz, RN

## 2017-01-04 NOTE — Discharge Instructions (Addendum)
Resume diet and activity as before  Can start Apirin 325 mg daily from 01/10/2017.  You need repeat hemoglobin checked by your doctor in 1 week.

## 2017-01-07 DIAGNOSIS — M6281 Muscle weakness (generalized): Secondary | ICD-10-CM | POA: Diagnosis not present

## 2017-01-07 DIAGNOSIS — M810 Age-related osteoporosis without current pathological fracture: Secondary | ICD-10-CM | POA: Diagnosis not present

## 2017-01-07 DIAGNOSIS — I110 Hypertensive heart disease with heart failure: Secondary | ICD-10-CM | POA: Diagnosis not present

## 2017-01-07 DIAGNOSIS — N390 Urinary tract infection, site not specified: Secondary | ICD-10-CM | POA: Diagnosis not present

## 2017-01-07 DIAGNOSIS — F329 Major depressive disorder, single episode, unspecified: Secondary | ICD-10-CM | POA: Diagnosis not present

## 2017-01-07 DIAGNOSIS — I2510 Atherosclerotic heart disease of native coronary artery without angina pectoris: Secondary | ICD-10-CM | POA: Diagnosis not present

## 2017-01-07 DIAGNOSIS — I480 Paroxysmal atrial fibrillation: Secondary | ICD-10-CM | POA: Diagnosis not present

## 2017-01-07 DIAGNOSIS — I5030 Unspecified diastolic (congestive) heart failure: Secondary | ICD-10-CM | POA: Diagnosis not present

## 2017-01-07 DIAGNOSIS — K7460 Unspecified cirrhosis of liver: Secondary | ICD-10-CM | POA: Diagnosis not present

## 2017-01-07 NOTE — Progress Notes (Signed)
Cardiology Office Note  Date:  01/08/2017   ID:  ABEL RA, DOB 1921-12-17, MRN [id number]  PCP:  Tracie Harrier, MD   Chief Complaint  Patient presents with  . other    6 week follow up. Patient was in hospital july 25th with SOB and blood in stool otherwise doing well. Meds reviewed verbally with patient.     HPI:  Kathryn Short is an 83 year old woman with  paroxysmal atrial fibrillation (last episode in March 0354),  diastolic CHF on a  diuretic daily,  labile hypertension,  sick sinus syndrome, pacemaker placement followed by Dr. Caryl Comes,  in hospital [facility] on 02/16/2012 in atrial fibrillation.  open cholecystectomy, partial colon resection and colostomy, appendectomy, hysterectomy, oophorectomy, GYN malignancy treated with radiation in the 1950s bowel obstruction December 2015 She presents for routine follow-up of her blood pressure, diastolic CHF and atrial fibrillation  Recent hospitalization for GI bleed Blood in the toilet, went to the hospital Was anemic Required transfusion 1. Hemoglobin down to 7.5 known soft tissue mass in vaginal area  eliquis stopped , started on asa 325 at discharge   She presents with her daughter on today's visit She has leg swelling Pleural effusion noted in the hospital on x-ray  In follow-up today percent in a wheelchair, in good spirits,  less communicative, likely from hard of hearing Trace to 1+ leg swelling Has been taking torsemide 20 g daily She is taking iron  Takes clonidine and losartan as needed, not regular   Aldactone every other day Ambulating, no recent falls, unsteady gait  Seen by Dr. Caryl Comes on today's visit  LABS:Reviewed with her in detail on today's visit HCT 32 Cr 1.2 TSH 11 down to 8  no significant shortness of breath Working with physical therapy  EKG personally reviewed by myself on todays visit Shows paced rhythm 70 bpm no significant ST or T-wave changes  Other past medical history reviewed   On previous office visit she reported having symptoms of Vomiting, abdominal cramps Went to the hospital and had CT ABD:  aortic atherosclerotic Questionable mass but was seen by OB/GYN and was cleared  Lab work  05/01/2016 CR 1.2,  UTI, took ABX Enterococcus, sen to nitrofirantoin total chol 207, LDL 112,  Lab work January 2018 creatinine 1.3 Most recent creatinine 1.2 Chronic urinary tract infection TSH 11  EKG on today's visit shows paced rhythm, atrial paced, 61 bpm   Other past medical history clinic 8/9, Noted to have increased atrial fibrillation burden on pacer download January 03 2016 recommended she increase her amiodarone, is on anticoagulation Low BP, head fullness Recommended that she restart her Aldactone as blood pressure improves  Seen by Dr. Caryl Comes August 15, recommended she increase her torsemide for several days Seen by primary care 8/15: urine UTI No Urine cx available to date BMP shows creatinine continues to run higher consistent with prerenal state Taking torsemide daily with 1.5 torsemide on weekends, Aldactone added every other day  Previous CT scan done by primary care showing small to moderate right pleural effusion, evidence of cirrhosis Mention of cirrhosis back on CT scan in 2015 Mildly elevated LFTs over the past year  in the hospital 05/25/2014, with small bowel obstruction. She was placed on bowel rest, IV fluids and she passed her bowels. She did not require surgery  Echocardiogram done in the hospital 05/25/2014 showing ejection fraction greater than 55%, moderate pulmonary hypertension, mild to moderate TR  Earlier in 2015, she had atrial tachycardia and  pacer was tracking the atrial rhythm, Her device was reprogrammed in the office for DDI  She was continued on her beta blocker and amiodarone and rhythm broke, converting back to normal sinus rhythm. Pacemaker has been since changed back to her prior settings  She is reluctant  to use a walker, still uses a cane. Anticoagulation discussed previouslywith the patient and her daughter. They are not interested. They preferred to stay on aspirin  history of falls with trauma requiring stitches. None recently Patient and daughter are afraid of her fall risk, other complications from anticoagulation. Currently not on anticoagulation at their request. They are aware of risks and benefits associated with arrhythmia and stroke.  Previous lab work; Total cholesterol 189, LDL 104, hematocrit 38, creatinine 0.8  previous visits to the emergency room with chest pain, dizziness, malaise. She had a problem on isosorbide in the past. Feelings of insomnia, and anxious feeling, chest pain episodes at night that wake her from sleep, weakness, nausea, dizziness. She has obstructive sleep apnea and does not use her BiPAP at nighttime  Echocardiogram in the hospital showed ejection fraction 50-55%, moderate LVH, mild to moderate TR, right ventricular systolic pressure 40 mm of mercury, mild to moderate aortic valve insufficiency  PMH:   has a past medical history of Atrial fibrillation ([place]); Bowel obstruction ([facility]); CHF (congestive heart failure) ([place]); Fall; GERD (gastroesophageal reflux disease); Hypertension; Hypothyroidism; Melanoma ([place]); OSA (obstructive sleep apnea); and Uterine cancer ([place]).  PSH:    Past Surgical History:  Procedure Laterality Date  . CHOLECYSTECTOMY    . COLOSTOMY     For perforation  . Cytoscopy    . EP IMPLANTABLE DEVICE N/A 09/07/2015   Procedure:  PPM Generator Changeout;  Surgeon: Deboraha Sprang, MD;  Location: [facility];  Service: Cardiovascular;  Laterality: N/A;  . INSERT / REPLACE / REMOVE PACEMAKER     Implantation-Medtronic  . SKIN BIOPSY     face  . TOTAL ABDOMINAL HYSTERECTOMY W/ BILATERAL SALPINGOOPHORECTOMY      Current Outpatient Prescriptions  Medication Sig Dispense Refill  . acetaminophen (TYLENOL) 500 MG tablet Take  1,000 mg by mouth daily as needed for mild pain or headache.     . albuterol (PROVENTIL HFA;VENTOLIN HFA) 108 (90 BASE) MCG/ACT inhaler Inhale 2 puffs into the lungs every 6 (six) hours as needed for wheezing or shortness of breath.     Marland Kitchen amiodarone (PACERONE) 200 MG tablet Take 1 tablet (200 mg total) by mouth daily. 90 tablet 3  . apixaban (ELIQUIS) 2.5 MG TABS tablet Take 1 tablet (2.5 mg) by mouth twice daily 60 tablet 6  . b complex vitamins tablet Take 1 tablet by mouth daily.    . Carboxymethylcellul-Glycerin (LUBRICATING EYE DROPS OP) Place 2 drops into both eyes 2 (two) times daily.    . cefUROXime (CEFTIN) 250 MG tablet Take 250 mg by mouth daily as needed.     . clonazePAM (KLONOPIN) 0.5 MG tablet Take 0.5 mg by mouth 2 (two) times daily as needed for anxiety.    . cloNIDine (CATAPRES) 0.1 MG tablet Take 1 tablet (0.1 mg total) by mouth as needed. 60 tablet 6  . iron polysaccharides (NIFEREX) 150 MG capsule Take 150 mg by mouth daily.    Marland Kitchen lactulose (CHRONULAC) 10 GM/15ML solution Take 30 g by mouth daily.     Marland Kitchen levothyroxine (SYNTHROID, LEVOTHROID) 50 MCG tablet Take 50 mcg by mouth daily. 75 mcg on Saturday and Sunday    .  losartan (COZAAR) 100 MG tablet Take 1 tablet (100 mg total) by mouth daily as needed. 30 tablet 6  . metoprolol (LOPRESSOR) 50 MG tablet Take 1 tablet (50 mg total) by mouth 2 (two) times daily. 180 tablet 3  . mometasone-formoterol (DULERA) 100-5 MCG/ACT AERO Inhale 2 puffs into the lungs 2 (two) times daily as needed.     . montelukast (SINGULAIR) 10 MG tablet Take 10 mg by mouth at bedtime.    . Multiple Vitamin (MULTIVITAMIN) tablet Take 1 tablet by mouth daily.    . Multiple Vitamins-Minerals (PRESERVISION AREDS 2 PO) Take by mouth 2 (two) times daily.    . nitrofurantoin (MACRODANTIN) 100 MG capsule Take 100 mg by mouth 2 (two) times daily.    . nitroGLYCERIN (NITROSTAT) 0.4 MG SL tablet Place 1 tablet (0.4 mg total) under the tongue every 5 (five) minutes as  needed for chest pain. 25 tablet 6  . omeprazole (PRILOSEC) 20 MG capsule Take 20 mg by mouth 2 (two) times daily before a meal.    . potassium chloride (K-DUR,KLOR-CON) 10 MEQ tablet Take 1 tablet (10 mEq total) by mouth daily as needed. (Patient taking differently: Take 10 mEq by mouth daily. ) 90 tablet 3  . Probiotic Product (PROBIOTIC DAILY PO) Take 1 capsule by mouth daily.     . sertraline (ZOLOFT) 50 MG tablet Take 50 mg by mouth daily.    Marland Kitchen spironolactone (ALDACTONE) 25 MG tablet Take 25 mg by mouth every other day.     . torsemide (DEMADEX) 20 MG tablet Take 1 tablet (20 mg total) by mouth 2 (two) times daily as needed. 180 tablet 3  . Vitamin D, Ergocalciferol, (DRISDOL) 50000 units CAPS capsule Take 50,000 Units by mouth every 7 (seven) days.     No current facility-administered medications for this visit.      Allergies:   Ace inhibitors; Ambien [zolpidem tartrate]; Bactrim [sulfamethoxazole-trimethoprim]; Digoxin and related; Hctz [hydrochlorothiazide]; and Lunesta [eszopiclone]   Social History:  The patient  reports that she has never smoked. She has never used smokeless tobacco. She reports that she does not drink alcohol or use drugs.   Family History:   Family history is unknown by patient.    Review of Systems: Review of Systems  Constitutional: Negative.   Respiratory: Negative.   Cardiovascular: Positive for leg swelling.  Gastrointestinal: Negative.   Musculoskeletal: Negative.   Neurological: Negative.   Psychiatric/Behavioral: Negative.   All other systems reviewed and are negative.    PHYSICAL EXAM: VS:  BP (!) 112/50 (BP Location: Left Arm, Patient Position: Sitting, Cuff Size: Normal)   Pulse 70   Ht 5\' 2"  (1.575 m)   Wt 144 lb 8 oz (65.5 kg)   BMI 26.43 kg/m  , BMI Body mass index is 26.43 kg/m. GEN: Well nourished, well developed, in no acute distress , Hard of hearing, presents in a wheelchair  HEENT: normal  Neck: no JVD, carotid bruits, or  masses Cardiac: RRR; no murmurs, rubs, or gallops, 1+ pitting edema  bilateral lower extremities to the mid shins Respiratory:  clear to auscultation bilaterally, normal work of breathing GI: soft, nontender, nondistended, + BS MS: no deformity or atrophy  Skin: warm and dry, no rash Neuro:  Strength and sensation are intact Psych: euthymic mood, full affect    Recent Labs: 01/02/2017: ALT 48; TSH 9.127 01/03/2017: Platelets 278 01/04/2017: BUN 15; Creatinine, Ser 1.09; Hemoglobin 9.3; Potassium 3.7; Sodium 135    Lipid Panel No results  found for: CHOL, HDL, LDLCALC, TRIG    Wt Readings from Last 3 Encounters:  01/08/17 144 lb 8 oz (65.5 kg)  01/08/17 144 lb 8 oz (65.5 kg)  01/04/17 154 lb 1.6 oz (69.9 kg)       ASSESSMENT AND PLAN:  Essential hypertension - Plan: EKG 12-Lead Blood pressure well controlled, no changes to her medications  Paroxysmal atrial fibrillation ([place]) - Plan: EKG 12-Lead Paced rhythm, tolerating anticoagulation On low-dose amiodarone  Chronotropic incompetence with sinus node dysfunction  ([place]) - Plan: EKG 12-Lead Atrial paced rhythm, Managed by Dr. Caryl Comes  GI bleed Case discussed with Dr. Caryl Comes on today's visit We will hold aspirin, start low-dose eliquis 2.5 mg twice a day Recommended she discuss iron infusion with primary care through the cancer clinic/infusion center  Chronic diastolic CHF (congestive heart failure) ([place]) - Plan: EKG 12-Lead Torsemide 20 mg daily, consider extra torsemide for several days for worsening leg edema, pleural effusion   Total encounter time more than 25 minutes  Greater than 50% was spent in counseling and coordination of care with the patient   Disposition:   F/U  2 mo   No orders of the defined types were placed in this encounter.    Signed, Esmond Plants, M.D., Ph.D. 01/08/2017  [place], [place]

## 2017-01-08 ENCOUNTER — Encounter: Payer: Self-pay | Admitting: Cardiovascular Disease

## 2017-01-08 ENCOUNTER — Ambulatory Visit (INDEPENDENT_AMBULATORY_CARE_PROVIDER_SITE_OTHER): Payer: Medicare HMO | Admitting: Cardiovascular Disease

## 2017-01-08 ENCOUNTER — Encounter: Payer: Self-pay | Admitting: Internal Medicine

## 2017-01-08 ENCOUNTER — Ambulatory Visit (INDEPENDENT_AMBULATORY_CARE_PROVIDER_SITE_OTHER): Payer: Medicare HMO | Admitting: Internal Medicine

## 2017-01-08 VITALS — BP 112/50 | HR 70 | Ht 62.0 in | Wt 144.5 lb

## 2017-01-08 DIAGNOSIS — I471 Supraventricular tachycardia: Secondary | ICD-10-CM

## 2017-01-08 DIAGNOSIS — I495 Sick sinus syndrome: Secondary | ICD-10-CM

## 2017-01-08 DIAGNOSIS — I480 Paroxysmal atrial fibrillation: Secondary | ICD-10-CM

## 2017-01-08 DIAGNOSIS — I5030 Unspecified diastolic (congestive) heart failure: Secondary | ICD-10-CM | POA: Diagnosis not present

## 2017-01-08 DIAGNOSIS — I5032 Chronic diastolic (congestive) heart failure: Secondary | ICD-10-CM | POA: Diagnosis not present

## 2017-01-08 DIAGNOSIS — Z950 Presence of cardiac pacemaker: Secondary | ICD-10-CM

## 2017-01-08 DIAGNOSIS — M7989 Other specified soft tissue disorders: Secondary | ICD-10-CM | POA: Diagnosis not present

## 2017-01-08 DIAGNOSIS — I10 Essential (primary) hypertension: Secondary | ICD-10-CM | POA: Diagnosis not present

## 2017-01-08 LAB — CUP PACEART INCLINIC DEVICE CHECK
Battery Remaining Longevity: 129 mo
Battery Voltage: 2.79 V
Brady Statistic AP VP Percent: 0 %
Brady Statistic AS VP Percent: 0 %
Brady Statistic AS VS Percent: 0 %
Date Time Interrogation Session: 20180731115802
Implantable Lead Implant Date: 20080813
Implantable Lead Implant Date: 20080813
Implantable Lead Location: 753859
Implantable Lead Model: 5076
Implantable Pulse Generator Implant Date: 20170329
Lead Channel Impedance Value: 356 Ohm
Lead Channel Impedance Value: 432 Ohm
Lead Channel Pacing Threshold Amplitude: 1 V
Lead Channel Pacing Threshold Amplitude: 1.5 V
Lead Channel Pacing Threshold Pulse Width: 0.64 ms
Lead Channel Sensing Intrinsic Amplitude: 8 mV
Lead Channel Setting Pacing Amplitude: 2.5 V
MDC IDC LEAD LOCATION: 753860
MDC IDC MSMT BATTERY IMPEDANCE: 127 Ohm
MDC IDC MSMT LEADCHNL RA PACING THRESHOLD PULSEWIDTH: 0.4 ms
MDC IDC MSMT LEADCHNL RV PACING THRESHOLD AMPLITUDE: 1.25 V
MDC IDC MSMT LEADCHNL RV PACING THRESHOLD PULSEWIDTH: 0.4 ms
MDC IDC SET LEADCHNL RA PACING AMPLITUDE: 2 V
MDC IDC SET LEADCHNL RV PACING PULSEWIDTH: 0.64 ms
MDC IDC SET LEADCHNL RV SENSING SENSITIVITY: 2.8 mV
MDC IDC STAT BRADY AP VS PERCENT: 100 %

## 2017-01-08 MED ORDER — APIXABAN 2.5 MG PO TABS: ORAL_TABLET | ORAL | 6 refills | Status: DC

## 2017-01-08 MED ORDER — APIXABAN 2.5 MG PO TABS: 2.5000 mg | ORAL_TABLET | Freq: Two times a day (BID) | ORAL | Status: DC

## 2017-01-08 NOTE — Patient Instructions (Signed)
Medication Instructions: - Your physician has recommended you make the following change in your medication: 1) Stop aspirin 2) Start eliquis 2.5 mg- take 1 tablet by mouth twice daily 3) Increase demadex (torsemide) 20 mg- take 2 tablets (40 mg) by mouth once daily x 4 days, then resume normal dosing  Labwork: - none ordered  Procedures/Testing: - none ordered  Follow-Up: - Remote monitoring is used to monitor your Pacemaker of ICD from home. This monitoring reduces the number of office visits required to check your device to one time per year. It allows [place] to keep an eye on the functioning of your device to ensure it is working properly. You are scheduled for a device check from home on 04/09/17. You may send your transmission at any time that day. If you have a wireless device, the transmission will be sent automatically. After your physician reviews your transmission, you will receive a postcard with your next transmission date.  - Your physician wants you to follow-up in: 1 year with Dr. Caryl Comes. You will receive a reminder letter in the mail two months in advance. If you don't receive a letter, please call our office to schedule the follow-up appointment.   Any Additional Special Instructions Will Be Listed Below (If Applicable).     If you need a refill on your cardiac medications before your next appointment, please call your pharmacy.

## 2017-01-08 NOTE — Patient Instructions (Signed)
Medication Instructions:   No medication changes made  Labwork:  No new labs needed  Testing/Procedures:  No further testing at this time   Follow-Up: It was a pleasure seeing you in the office today. Please call us if you have new issues that need to be addressed before your next appt.  [phone]  Your physician wants you to follow-up in: 6 weeks   If you need a refill on your cardiac medications before your next appointment, please call your pharmacy.     

## 2017-01-08 NOTE — Progress Notes (Signed)
Patient Care Team: Tracie Harrier, MD as PCP - General (Internal Medicine) Minna Merritts, MD as Consulting Physician (Cardiology)   HPI  Kathryn Short is a 83 y.o. female seen in followup for atrial fibrillation and sinus bradycardia with prior pacemaker implantation.  She was previously on warfarin but because of falls, had been taking aspirin. This decison per primary Cardiology. More recently she has been treated with apixoban. She's also been treated with amiodarone  She underwent pacemaker generator replacement 3/17     DATE TEST    2015    Myoview   EF 50-55 % No ischemia  7/18    Echo   EF 55-60 %           Date Cr Hgb TSH LFTs  8/17 1.22 13    7/18 1.09 9.3 9.127 48   She was recently hospitalized for GI bleed, thought 2/2 to pelvic mass, thought to be recurrence of cancer ( notes from hosp reviewed.)  apixoban was held and started on ASA 325??   Also on Fe replacement   Thromboembolic risk factors ( age  -2, HTN-1, Gender-1) for a CHADSVASc Score of 4  She has had progressive weakness but without chest pain,  There has been modest edema.  BP are somewhat labile   Past Medical History:  Diagnosis Date  . Atrial fibrillation ([place])   . Bowel obstruction ([place])   . CHF (congestive heart failure) ([place])   . Fall   . GERD (gastroesophageal reflux disease)   . Hypertension   . Hypothyroidism    Treated  . Melanoma ([place])    Resected from back  . OSA (obstructive sleep apnea)    On BiPAP  . Uterine cancer [facility])    s/p radiation therapy in 1952    Past Surgical History:  Procedure Laterality Date  . CHOLECYSTECTOMY    . COLOSTOMY     For perforation  . Cytoscopy    . EP IMPLANTABLE DEVICE N/A 09/07/2015   Procedure:  PPM Generator Changeout;  Surgeon: Deboraha Sprang, MD;  Location: [place] [facility];  Service: Cardiovascular;  Laterality: N/A;  . INSERT / REPLACE / REMOVE PACEMAKER     Implantation-Medtronic  . SKIN BIOPSY     face  . TOTAL ABDOMINAL  HYSTERECTOMY W/ BILATERAL SALPINGOOPHORECTOMY      Current Outpatient Prescriptions  Medication Sig Dispense Refill  . acetaminophen (TYLENOL) 500 MG tablet Take 1,000 mg by mouth daily as needed for mild pain or headache.     . albuterol (PROVENTIL HFA;VENTOLIN HFA) 108 (90 BASE) MCG/ACT inhaler Inhale 2 puffs into the lungs every 6 (six) hours as needed for wheezing or shortness of breath.     Marland Kitchen amiodarone (PACERONE) 200 MG tablet Take 1 tablet (200 mg total) by mouth daily. 90 tablet 3  . aspirin 325 MG tablet Take 325 mg by mouth daily.    Marland Kitchen b complex vitamins tablet Take 1 tablet by mouth daily.    . Carboxymethylcellul-Glycerin (LUBRICATING EYE DROPS OP) Place 2 drops into both eyes 2 (two) times daily.    . cefUROXime (CEFTIN) 250 MG tablet Take 250 mg by mouth daily as needed.     . clonazePAM (KLONOPIN) 0.5 MG tablet Take 0.5 mg by mouth 2 (two) times daily as needed for anxiety.    . cloNIDine (CATAPRES) 0.1 MG tablet Take 1 tablet (0.1 mg total) by mouth as needed. 60 tablet 6  . lactulose (CHRONULAC) 10 GM/15ML solution Take  30 g by mouth daily.     Marland Kitchen levothyroxine (SYNTHROID, LEVOTHROID) 50 MCG tablet Take 50 mcg by mouth daily. 75 mcg on Saturday and Sunday    . losartan (COZAAR) 100 MG tablet Take 1 tablet (100 mg total) by mouth daily as needed. 30 tablet 6  . metoprolol (LOPRESSOR) 50 MG tablet Take 1 tablet (50 mg total) by mouth 2 (two) times daily. 180 tablet 3  . mometasone-formoterol (DULERA) 100-5 MCG/ACT AERO Inhale 2 puffs into the lungs 2 (two) times daily as needed.     . montelukast (SINGULAIR) 10 MG tablet Take 10 mg by mouth at bedtime.    . Multiple Vitamin (MULTIVITAMIN) tablet Take 1 tablet by mouth daily.    . Multiple Vitamins-Minerals (PRESERVISION AREDS 2 PO) Take by mouth 2 (two) times daily.    . nitrofurantoin (MACRODANTIN) 100 MG capsule Take 100 mg by mouth 2 (two) times daily.    . nitroGLYCERIN (NITROSTAT) 0.4 MG SL tablet Place 1 tablet (0.4 mg  total) under the tongue every 5 (five) minutes as needed for chest pain. 25 tablet 6  . omeprazole (PRILOSEC) 20 MG capsule Take 20 mg by mouth 2 (two) times daily before a meal.    . potassium chloride (K-DUR,KLOR-CON) 10 MEQ tablet Take 1 tablet (10 mEq total) by mouth daily as needed. (Patient taking differently: Take 10 mEq by mouth daily. ) 90 tablet 3  . Probiotic Product (PROBIOTIC DAILY PO) Take 1 capsule by mouth daily.     . sertraline (ZOLOFT) 50 MG tablet Take 50 mg by mouth daily.    Marland Kitchen spironolactone (ALDACTONE) 25 MG tablet Take 25 mg by mouth every other day.     . torsemide (DEMADEX) 20 MG tablet Take 1 tablet (20 mg total) by mouth 2 (two) times daily as needed. 180 tablet 3  . Vitamin D, Ergocalciferol, (DRISDOL) 50000 units CAPS capsule Take 50,000 Units by mouth every 7 (seven) days.     No current facility-administered medications for this visit.     Allergies  Allergen Reactions  . Ace Inhibitors Other (See Comments)    Unknown  . Ambien [Zolpidem Tartrate] Other (See Comments)    Dizzy  . Bactrim [Sulfamethoxazole-Trimethoprim] Other (See Comments)    Unknown  . Digoxin And Related Other (See Comments)    Didn't do well  . Hctz [Hydrochlorothiazide] Other (See Comments)    Unknown  . Lunesta [Eszopiclone] Other (See Comments)    Dizzy    Review of Systems negative except from HPI and PMH  Physical Exam BP (!) 112/50 (BP Location: Left Arm, Patient Position: Sitting, Cuff Size: Normal)   Pulse 70   Ht 5\' 2"  (1.575 m)   Wt 144 lb 8 oz (65.5 kg)   BMI 26.43 kg/m  Well developed and nourished in no acute distress HENT normal  HOH Neck supple with JVP-flat Carotids brisk and full without bruits Clear Device pocket well healed; without hematoma or erythema.  There is no tethering  Regular rate and rhythm, no murmurs or gallops Abd-soft with active BS without hepatomegaly No Clubbing cyanosis 2+edema Skin-warm and dry A & Oriented  Grossly normal sensory  and motor function   ECG demonstrates P pacing 65 30/09/45    Assessment and  Plan  Hypertension  Atrial fibrillation-Paroxysmal   HFpEF  Sinus node dysfunction-symptomatic     Pacemaker-Medtronic The patient's device was interrogated.  The information was reviewed.    Perirectal bleeding    No interval afib  Have had long discussion re anticoagulation    The first point is that there IS NO ROLE FOR ASA, of any dose.  We will stop it.  Her annualized stroke risk is about 5-6% yr and we discussed mortality rates related to AFib assoc strokes.  She would and her daughter would like to try and skirt between Scylla and Chyrbidis  And so we will try low dose apixoban notwithstanding her Cr 1.09  She will need close followup of her Hgb.  I wonder whether she would benefit from Iron infusion, as her energy tolerance seems to still be lagging along with the recovery of her Hgb  I will forward this to her PCP  She also has significant edema,  Will increase her diuretic to demadex 40 d x 4 d More than 50% of 40 min was spent in counseling related to the above

## 2017-01-09 NOTE — Discharge Summary (Signed)
[facility] NAME: Kathryn Short    MR#:  [id number]  DATE OF BIRTH:  April 27, 1922  DATE OF ADMISSION:  01/02/2017 ADMITTING PHYSICIAN: Theodoro Grist, MD  DATE OF DISCHARGE: 01/04/2017  1:02 PM  PRIMARY CARE PHYSICIAN: Tracie Harrier, MD   ADMISSION DIAGNOSIS:  Symptomatic anemia   DISCHARGE DIAGNOSIS:  Active Problems:   Gastrointestinal bleeding   Acute posthemorrhagic anemia   Dyspnea   Swelling of lower extremity   Generalized weakness   Abdominal pain   Vaginal mass   Palliative care by specialist   Goals of care, counseling/discussion   SECONDARY DIAGNOSIS:   Past Medical History:  Diagnosis Date  . Atrial fibrillation ([place])   . Bowel obstruction ([place])   . CHF (congestive heart failure) ([place])   . Fall   . GERD (gastroesophageal reflux disease)   . Hypertension   . Hypothyroidism    Treated  . Melanoma ([place])    Resected from back  . OSA (obstructive sleep apnea)    On BiPAP  . Uterine cancer [facility])    s/p radiation therapy in 1952     ADMITTING HISTORY  HISTORY OF PRESENT ILLNESS: Kathryn Short  is a 83 y.o. female with a known history of atrial fibrillation, bowel obstruction in the past, CHF, essential hypertension, hypothyroidism, uterine cancer, who has known soft tissue mass and vaginal area since January 2018, visible on CT scan at that time, also liver cirrhosis, chronic iron deficiency anemia, who presents to the hospital after she was noted to be more anemic, complained of shortness of breath. Apparently, patient was noted to have dark blood clots on the pad on Friday, 12/29/2016, she was noted to be weaker. She was given lactulose to produce bowel movements, when she was noted to have bright red blood in stool. She was seen by gastroenterologist and was ordered. CT scan today, results of which are pending. Her hemoglobin level was checked today and it was found to be 8.0, which is about 2 g, down from  about 3 weeks ago. She was seen by her oncologist today and recommended direct admission. Since patient is weaker and more short of breath than usual. Patient's daughter is complaining of patient having gastrointestinal  bleed, lower extremity swelling, seems to be worsening over the period of weeks, shortness of breath, orthopnea, dyspnea on exertion. Patient herself is very hard of hearing, she complains of lower abdominal pain during palpation. The patient apparently recently finished antibiotic therapy for urinary tract infection, no diarrheal stools were noted, except of his lactulose  HOSPITAL COURSE:   * Lower GI bleed with acute blood loss anemia * Vaginal mass which is chronic. * Acute on chronic diastolic congestive heart failure * Dementia * History of uterine cancer  Patient was admitted to medical floor and transfuse 1 unit packed RBC. Hemoglobin improved. Patient's Eliquis was stopped.. Considering her advanced age and endoscopy or colonoscopy were deferred. She will stay off anticoagulation at this time. Advised to start a baby aspirin after 1 week if there is no bleeding.  For her vaginal mass found on the CT scan gynecology saw the patient. This was thought to be a scar stable from prior CT scans. No further investigations needed.  1 dose of IV Lasix for acute on chronic diastolic congestive heart failure which resolved.  Patient is stable for discharge home.  CONSULTS OBTAINED:    DRUG ALLERGIES:   Allergies  Allergen Reactions  . Ace  Inhibitors Other (See Comments)    Unknown  . Ambien [Zolpidem Tartrate] Other (See Comments)    Dizzy  . Bactrim [Sulfamethoxazole-Trimethoprim] Other (See Comments)    Unknown  . Digoxin And Related Other (See Comments)    Didn't do well  . Hctz [Hydrochlorothiazide] Other (See Comments)    Unknown  . Lunesta [Eszopiclone] Other (See Comments)    Dizzy    DISCHARGE MEDICATIONS:   Discharge Medication List as of 01/04/2017  12:41 PM    CONTINUE these medications which have NOT CHANGED   Details  acetaminophen (TYLENOL) 500 MG tablet Take 1,000 mg by mouth daily as needed for mild pain or headache. , Until Discontinued, Historical Med    albuterol (PROVENTIL HFA;VENTOLIN HFA) 108 (90 BASE) MCG/ACT inhaler Inhale 2 puffs into the lungs every 6 (six) hours as needed for wheezing or shortness of breath. , Until Discontinued, Historical Med    amiodarone (PACERONE) 200 MG tablet Take 1 tablet (200 mg total) by mouth daily., Starting Wed 01/18/2016, Normal    b complex vitamins tablet Take 1 tablet by mouth daily., Until Discontinued, Historical Med    Carboxymethylcellul-Glycerin (LUBRICATING EYE DROPS OP) Place 2 drops into both eyes 2 (two) times daily., Until Discontinued, Historical Med    clonazePAM (KLONOPIN) 0.5 MG tablet Take 0.5 mg by mouth 2 (two) times daily as needed for anxiety., Historical Med    cloNIDine (CATAPRES) 0.1 MG tablet Take 1 tablet (0.1 mg total) by mouth as needed., Starting Mon 07/30/2016, Normal    lactulose (CHRONULAC) 10 GM/15ML solution Take 30 g by mouth daily. , Starting 05/31/2014, Until Discontinued, Historical Med    levothyroxine (SYNTHROID, LEVOTHROID) 50 MCG tablet Take 50 mcg by mouth daily. 75 mcg on Saturday and Sunday, Historical Med    losartan (COZAAR) 100 MG tablet Take 1 tablet (100 mg total) by mouth daily as needed., Starting Mon 07/30/2016, Normal    metoprolol (LOPRESSOR) 50 MG tablet Take 1 tablet (50 mg total) by mouth 2 (two) times daily., Starting Mon 07/30/2016, Normal    montelukast (SINGULAIR) 10 MG tablet Take 10 mg by mouth at bedtime., Starting 10/14/2014, Until Discontinued, Historical Med    Multiple Vitamin (MULTIVITAMIN) tablet Take 1 tablet by mouth daily., Until Discontinued, Historical Med    Multiple Vitamins-Minerals (PRESERVISION AREDS 2 PO) Take by mouth 2 (two) times daily., Historical Med    nitroGLYCERIN (NITROSTAT) 0.4 MG SL tablet Place 1  tablet (0.4 mg total) under the tongue every 5 (five) minutes as needed for chest pain., Starting Mon 07/30/2016, Normal    omeprazole (PRILOSEC) 20 MG capsule Take 20 mg by mouth 2 (two) times daily before a meal., Historical Med    potassium chloride (K-DUR,KLOR-CON) 10 MEQ tablet Take 1 tablet (10 mEq total) by mouth daily as needed., Starting 03/28/2015, Until Discontinued, Normal    sertraline (ZOLOFT) 50 MG tablet Take 50 mg by mouth daily., Historical Med    spironolactone (ALDACTONE) 25 MG tablet Take 25 mg by mouth every other day. , Historical Med    torsemide (DEMADEX) 20 MG tablet Take 1 tablet (20 mg total) by mouth 2 (two) times daily as needed., Starting Thu 11/22/2016, Normal    Vitamin D, Ergocalciferol, (DRISDOL) 50000 units CAPS capsule Take 50,000 Units by mouth every 7 (seven) days., Historical Med    cefUROXime (CEFTIN) 250 MG tablet Take 250 mg by mouth daily as needed. , Historical Med    mometasone-formoterol (DULERA) 100-5 MCG/ACT AERO Inhale 2 puffs into  the lungs 2 (two) times daily as needed. , Historical Med    nitrofurantoin (MACRODANTIN) 100 MG capsule Take 100 mg by mouth 2 (two) times daily., Historical Med    Probiotic Product (PROBIOTIC DAILY PO) Take 1 capsule by mouth daily. , Until Discontinued, Historical Med      STOP taking these medications     iron polysaccharides (NIFEREX) 150 MG capsule         Today   VITAL SIGNS:  Blood pressure 116/67, pulse 61, temperature 97.9 F (36.6 C), temperature source Axillary, resp. rate 19, height 5\' 2"  (1.575 m), weight 69.9 kg (154 lb 1.6 oz), SpO2 95 %.  I/O:  No intake or output data in the 24 hours ending 01/09/17 1323  PHYSICAL EXAMINATION:  Physical Exam  GENERAL:  83 y.o.-year-old patient lying in the bed with no acute distress.  LUNGS: Normal breath sounds bilaterally, no wheezing, rales,rhonchi or crepitation. No use of accessory muscles of respiration.  CARDIOVASCULAR: S1, S2 normal. No  murmurs, rubs, or gallops.  ABDOMEN: Soft, non-tender, non-distended. Bowel sounds present. No organomegaly or mass.  NEUROLOGIC: Moves all 4 extremities. PSYCHIATRIC: The patient is alert and oriented x 3.  SKIN: No obvious rash, lesion, or ulcer.   DATA REVIEW:   CBC  Recent Labs Lab 01/03/17 0639 01/03/17 1927 01/04/17 0314  WBC 10.8  --   --   HGB 7.5* 9.8* 9.3*  HCT 22.3* 29.0*  --   PLT 278  --   --     Chemistries   Recent Labs Lab 01/02/17 1456  01/04/17 0314  NA  --   < > 135  K  --   < > 3.7  CL  --   < > 98*  CO2  --   < > 28  GLUCOSE  --   < > 123*  BUN  --   < > 15  CREATININE  --   < > 1.09*  CALCIUM  --   < > 8.3*  AST 75*  --   --   ALT 48  --   --   ALKPHOS 90  --   --   BILITOT 0.5  --   --   < > = values in this interval not displayed.  Cardiac Enzymes  Recent Labs Lab 01/03/17 0639  TROPONINI <0.03    Microbiology Results  Results for orders placed or performed during the hospital encounter of 01/16/16  Urine culture     Status: None   Collection Time: 01/16/16 11:45 PM  Result Value Ref Range Status   Specimen Description URINE, RANDOM  Final   Special Requests Normal  Final   Culture NO GROWTH Performed at [facility]   Final   Report Status 01/18/2016 FINAL  Final    RADIOLOGY:  No results found.  Follow up with PCP in 1 week.  Management plans discussed with the patient, family and they are in agreement.  CODE STATUS:  Code Status History    Date Active Date Inactive Code Status Order ID Comments User Context   01/02/2017  6:38 PM 01/04/2017  5:35 PM Full Code [id number]  Theodoro Grist, MD Inpatient    Advance Directive Documentation     Most Recent Value  Type of Advance Directive  Healthcare Power of Attorney  Pre-existing out of facility DNR order (yellow form or pink MOST form)  -  "MOST" Form in Place?  -      [place]  CARE OF THIS PATIENT ON DAY OF DISCHARGE: more than 30 minutes.    Hillary Bow R M.D on 01/09/2017 at 1:23 PM  Between 7am to 6pm - Pager - [phone]  After 6pm go to [email or web address] - password [email or web address] [facility]  Office  [phone]  CC: Primary care physician; Tracie Harrier, MD  Note: This dictation was prepared with Dragon dictation along with smaller phrase technology. Any transcriptional errors that result from this process are unintentional.

## 2017-01-10 ENCOUNTER — Other Ambulatory Visit: Payer: Self-pay | Admitting: Oncology

## 2017-01-10 DIAGNOSIS — D649 Anemia, unspecified: Secondary | ICD-10-CM

## 2017-01-10 DIAGNOSIS — I480 Paroxysmal atrial fibrillation: Secondary | ICD-10-CM | POA: Diagnosis not present

## 2017-01-10 DIAGNOSIS — F329 Major depressive disorder, single episode, unspecified: Secondary | ICD-10-CM | POA: Diagnosis not present

## 2017-01-10 DIAGNOSIS — M6281 Muscle weakness (generalized): Secondary | ICD-10-CM | POA: Diagnosis not present

## 2017-01-10 DIAGNOSIS — I5030 Unspecified diastolic (congestive) heart failure: Secondary | ICD-10-CM | POA: Diagnosis not present

## 2017-01-10 DIAGNOSIS — I110 Hypertensive heart disease with heart failure: Secondary | ICD-10-CM | POA: Diagnosis not present

## 2017-01-10 DIAGNOSIS — K7460 Unspecified cirrhosis of liver: Secondary | ICD-10-CM | POA: Diagnosis not present

## 2017-01-10 DIAGNOSIS — M810 Age-related osteoporosis without current pathological fracture: Secondary | ICD-10-CM | POA: Diagnosis not present

## 2017-01-10 DIAGNOSIS — N390 Urinary tract infection, site not specified: Secondary | ICD-10-CM | POA: Diagnosis not present

## 2017-01-10 DIAGNOSIS — I2510 Atherosclerotic heart disease of native coronary artery without angina pectoris: Secondary | ICD-10-CM | POA: Diagnosis not present

## 2017-01-11 ENCOUNTER — Inpatient Hospital Stay: Payer: Medicare HMO | Admitting: Oncology

## 2017-01-11 ENCOUNTER — Other Ambulatory Visit: Payer: Medicare HMO

## 2017-01-14 DIAGNOSIS — H353211 Exudative age-related macular degeneration, right eye, with active choroidal neovascularization: Secondary | ICD-10-CM | POA: Diagnosis not present

## 2017-01-15 DIAGNOSIS — I110 Hypertensive heart disease with heart failure: Secondary | ICD-10-CM | POA: Diagnosis not present

## 2017-01-15 DIAGNOSIS — M810 Age-related osteoporosis without current pathological fracture: Secondary | ICD-10-CM | POA: Diagnosis not present

## 2017-01-15 DIAGNOSIS — N390 Urinary tract infection, site not specified: Secondary | ICD-10-CM | POA: Diagnosis not present

## 2017-01-15 DIAGNOSIS — F329 Major depressive disorder, single episode, unspecified: Secondary | ICD-10-CM | POA: Diagnosis not present

## 2017-01-15 DIAGNOSIS — K7460 Unspecified cirrhosis of liver: Secondary | ICD-10-CM | POA: Diagnosis not present

## 2017-01-15 DIAGNOSIS — I2510 Atherosclerotic heart disease of native coronary artery without angina pectoris: Secondary | ICD-10-CM | POA: Diagnosis not present

## 2017-01-15 DIAGNOSIS — I480 Paroxysmal atrial fibrillation: Secondary | ICD-10-CM | POA: Diagnosis not present

## 2017-01-15 DIAGNOSIS — M6281 Muscle weakness (generalized): Secondary | ICD-10-CM | POA: Diagnosis not present

## 2017-01-15 DIAGNOSIS — I5030 Unspecified diastolic (congestive) heart failure: Secondary | ICD-10-CM | POA: Diagnosis not present

## 2017-01-17 DIAGNOSIS — I2510 Atherosclerotic heart disease of native coronary artery without angina pectoris: Secondary | ICD-10-CM | POA: Diagnosis not present

## 2017-01-17 DIAGNOSIS — K7460 Unspecified cirrhosis of liver: Secondary | ICD-10-CM | POA: Diagnosis not present

## 2017-01-17 DIAGNOSIS — M810 Age-related osteoporosis without current pathological fracture: Secondary | ICD-10-CM | POA: Diagnosis not present

## 2017-01-17 DIAGNOSIS — I5030 Unspecified diastolic (congestive) heart failure: Secondary | ICD-10-CM | POA: Diagnosis not present

## 2017-01-17 DIAGNOSIS — M6281 Muscle weakness (generalized): Secondary | ICD-10-CM | POA: Diagnosis not present

## 2017-01-17 DIAGNOSIS — N390 Urinary tract infection, site not specified: Secondary | ICD-10-CM | POA: Diagnosis not present

## 2017-01-17 DIAGNOSIS — I110 Hypertensive heart disease with heart failure: Secondary | ICD-10-CM | POA: Diagnosis not present

## 2017-01-17 DIAGNOSIS — I480 Paroxysmal atrial fibrillation: Secondary | ICD-10-CM | POA: Diagnosis not present

## 2017-01-17 DIAGNOSIS — F329 Major depressive disorder, single episode, unspecified: Secondary | ICD-10-CM | POA: Diagnosis not present

## 2017-01-18 DIAGNOSIS — I10 Essential (primary) hypertension: Secondary | ICD-10-CM | POA: Diagnosis not present

## 2017-01-18 DIAGNOSIS — N390 Urinary tract infection, site not specified: Secondary | ICD-10-CM | POA: Diagnosis not present

## 2017-01-18 DIAGNOSIS — E079 Disorder of thyroid, unspecified: Secondary | ICD-10-CM | POA: Diagnosis not present

## 2017-01-18 DIAGNOSIS — E785 Hyperlipidemia, unspecified: Secondary | ICD-10-CM | POA: Diagnosis not present

## 2017-01-18 DIAGNOSIS — D649 Anemia, unspecified: Secondary | ICD-10-CM | POA: Diagnosis not present

## 2017-01-21 DIAGNOSIS — D500 Iron deficiency anemia secondary to blood loss (chronic): Secondary | ICD-10-CM | POA: Diagnosis not present

## 2017-01-21 DIAGNOSIS — M6281 Muscle weakness (generalized): Secondary | ICD-10-CM | POA: Diagnosis not present

## 2017-01-21 DIAGNOSIS — I110 Hypertensive heart disease with heart failure: Secondary | ICD-10-CM | POA: Diagnosis not present

## 2017-01-21 DIAGNOSIS — I5032 Chronic diastolic (congestive) heart failure: Secondary | ICD-10-CM | POA: Diagnosis not present

## 2017-01-24 DIAGNOSIS — F0390 Unspecified dementia without behavioral disturbance: Secondary | ICD-10-CM | POA: Diagnosis not present

## 2017-01-24 DIAGNOSIS — I2510 Atherosclerotic heart disease of native coronary artery without angina pectoris: Secondary | ICD-10-CM | POA: Diagnosis not present

## 2017-01-24 DIAGNOSIS — I110 Hypertensive heart disease with heart failure: Secondary | ICD-10-CM | POA: Diagnosis not present

## 2017-01-24 DIAGNOSIS — K7460 Unspecified cirrhosis of liver: Secondary | ICD-10-CM | POA: Diagnosis not present

## 2017-01-24 DIAGNOSIS — I5032 Chronic diastolic (congestive) heart failure: Secondary | ICD-10-CM | POA: Diagnosis not present

## 2017-01-24 DIAGNOSIS — D500 Iron deficiency anemia secondary to blood loss (chronic): Secondary | ICD-10-CM | POA: Diagnosis not present

## 2017-01-24 DIAGNOSIS — I480 Paroxysmal atrial fibrillation: Secondary | ICD-10-CM | POA: Diagnosis not present

## 2017-01-24 DIAGNOSIS — M6281 Muscle weakness (generalized): Secondary | ICD-10-CM | POA: Diagnosis not present

## 2017-01-24 DIAGNOSIS — F329 Major depressive disorder, single episode, unspecified: Secondary | ICD-10-CM | POA: Diagnosis not present

## 2017-01-25 DIAGNOSIS — Z09 Encounter for follow-up examination after completed treatment for conditions other than malignant neoplasm: Secondary | ICD-10-CM | POA: Diagnosis not present

## 2017-01-25 DIAGNOSIS — I5032 Chronic diastolic (congestive) heart failure: Secondary | ICD-10-CM | POA: Diagnosis not present

## 2017-01-25 DIAGNOSIS — I480 Paroxysmal atrial fibrillation: Secondary | ICD-10-CM | POA: Diagnosis not present

## 2017-01-25 DIAGNOSIS — E785 Hyperlipidemia, unspecified: Secondary | ICD-10-CM | POA: Diagnosis not present

## 2017-01-25 DIAGNOSIS — K7460 Unspecified cirrhosis of liver: Secondary | ICD-10-CM | POA: Diagnosis not present

## 2017-01-25 DIAGNOSIS — E079 Disorder of thyroid, unspecified: Secondary | ICD-10-CM | POA: Diagnosis not present

## 2017-01-25 DIAGNOSIS — I10 Essential (primary) hypertension: Secondary | ICD-10-CM | POA: Diagnosis not present

## 2017-01-25 DIAGNOSIS — D500 Iron deficiency anemia secondary to blood loss (chronic): Secondary | ICD-10-CM | POA: Diagnosis not present

## 2017-01-28 DIAGNOSIS — H353211 Exudative age-related macular degeneration, right eye, with active choroidal neovascularization: Secondary | ICD-10-CM | POA: Diagnosis not present

## 2017-01-28 DIAGNOSIS — H353221 Exudative age-related macular degeneration, left eye, with active choroidal neovascularization: Secondary | ICD-10-CM | POA: Diagnosis not present

## 2017-01-30 DIAGNOSIS — I2510 Atherosclerotic heart disease of native coronary artery without angina pectoris: Secondary | ICD-10-CM | POA: Diagnosis not present

## 2017-01-30 DIAGNOSIS — K7460 Unspecified cirrhosis of liver: Secondary | ICD-10-CM | POA: Diagnosis not present

## 2017-01-30 DIAGNOSIS — F0390 Unspecified dementia without behavioral disturbance: Secondary | ICD-10-CM | POA: Diagnosis not present

## 2017-01-30 DIAGNOSIS — I5032 Chronic diastolic (congestive) heart failure: Secondary | ICD-10-CM | POA: Diagnosis not present

## 2017-01-30 DIAGNOSIS — I110 Hypertensive heart disease with heart failure: Secondary | ICD-10-CM | POA: Diagnosis not present

## 2017-01-30 DIAGNOSIS — D500 Iron deficiency anemia secondary to blood loss (chronic): Secondary | ICD-10-CM | POA: Diagnosis not present

## 2017-01-30 DIAGNOSIS — I480 Paroxysmal atrial fibrillation: Secondary | ICD-10-CM | POA: Diagnosis not present

## 2017-01-30 DIAGNOSIS — M6281 Muscle weakness (generalized): Secondary | ICD-10-CM | POA: Diagnosis not present

## 2017-01-30 DIAGNOSIS — F329 Major depressive disorder, single episode, unspecified: Secondary | ICD-10-CM | POA: Diagnosis not present

## 2017-02-01 DIAGNOSIS — I5032 Chronic diastolic (congestive) heart failure: Secondary | ICD-10-CM | POA: Diagnosis not present

## 2017-02-01 DIAGNOSIS — D500 Iron deficiency anemia secondary to blood loss (chronic): Secondary | ICD-10-CM | POA: Diagnosis not present

## 2017-02-01 DIAGNOSIS — M6281 Muscle weakness (generalized): Secondary | ICD-10-CM | POA: Diagnosis not present

## 2017-02-01 DIAGNOSIS — F0390 Unspecified dementia without behavioral disturbance: Secondary | ICD-10-CM | POA: Diagnosis not present

## 2017-02-01 DIAGNOSIS — I110 Hypertensive heart disease with heart failure: Secondary | ICD-10-CM | POA: Diagnosis not present

## 2017-02-01 DIAGNOSIS — I480 Paroxysmal atrial fibrillation: Secondary | ICD-10-CM | POA: Diagnosis not present

## 2017-02-01 DIAGNOSIS — K7460 Unspecified cirrhosis of liver: Secondary | ICD-10-CM | POA: Diagnosis not present

## 2017-02-01 DIAGNOSIS — F329 Major depressive disorder, single episode, unspecified: Secondary | ICD-10-CM | POA: Diagnosis not present

## 2017-02-01 DIAGNOSIS — I2510 Atherosclerotic heart disease of native coronary artery without angina pectoris: Secondary | ICD-10-CM | POA: Diagnosis not present

## 2017-02-05 ENCOUNTER — Inpatient Hospital Stay: Payer: Medicare HMO

## 2017-02-05 ENCOUNTER — Other Ambulatory Visit: Payer: Self-pay | Admitting: Oncology

## 2017-02-05 ENCOUNTER — Inpatient Hospital Stay: Payer: Medicare HMO | Attending: Oncology | Admitting: Oncology

## 2017-02-05 ENCOUNTER — Encounter: Payer: Self-pay | Admitting: Oncology

## 2017-02-05 VITALS — BP 103/64 | HR 60 | Temp 96.9°F | Resp 16 | Wt 142.2 lb

## 2017-02-05 DIAGNOSIS — K7460 Unspecified cirrhosis of liver: Secondary | ICD-10-CM

## 2017-02-05 DIAGNOSIS — E039 Hypothyroidism, unspecified: Secondary | ICD-10-CM

## 2017-02-05 DIAGNOSIS — I10 Essential (primary) hypertension: Secondary | ICD-10-CM

## 2017-02-05 DIAGNOSIS — I509 Heart failure, unspecified: Secondary | ICD-10-CM

## 2017-02-05 DIAGNOSIS — K219 Gastro-esophageal reflux disease without esophagitis: Secondary | ICD-10-CM

## 2017-02-05 DIAGNOSIS — M8580 Other specified disorders of bone density and structure, unspecified site: Secondary | ICD-10-CM | POA: Diagnosis not present

## 2017-02-05 DIAGNOSIS — D649 Anemia, unspecified: Secondary | ICD-10-CM

## 2017-02-05 DIAGNOSIS — I480 Paroxysmal atrial fibrillation: Secondary | ICD-10-CM | POA: Diagnosis not present

## 2017-02-05 DIAGNOSIS — I2510 Atherosclerotic heart disease of native coronary artery without angina pectoris: Secondary | ICD-10-CM | POA: Diagnosis not present

## 2017-02-05 DIAGNOSIS — G4730 Sleep apnea, unspecified: Secondary | ICD-10-CM | POA: Diagnosis not present

## 2017-02-05 DIAGNOSIS — C55 Malignant neoplasm of uterus, part unspecified: Secondary | ICD-10-CM

## 2017-02-05 DIAGNOSIS — Z7901 Long term (current) use of anticoagulants: Secondary | ICD-10-CM | POA: Diagnosis not present

## 2017-02-05 DIAGNOSIS — Z85820 Personal history of malignant melanoma of skin: Secondary | ICD-10-CM

## 2017-02-05 DIAGNOSIS — Z923 Personal history of irradiation: Secondary | ICD-10-CM | POA: Diagnosis not present

## 2017-02-05 DIAGNOSIS — Z79899 Other long term (current) drug therapy: Secondary | ICD-10-CM

## 2017-02-05 DIAGNOSIS — M6281 Muscle weakness (generalized): Secondary | ICD-10-CM | POA: Diagnosis not present

## 2017-02-05 DIAGNOSIS — Z8542 Personal history of malignant neoplasm of other parts of uterus: Secondary | ICD-10-CM

## 2017-02-05 DIAGNOSIS — I4891 Unspecified atrial fibrillation: Secondary | ICD-10-CM | POA: Diagnosis not present

## 2017-02-05 DIAGNOSIS — I5032 Chronic diastolic (congestive) heart failure: Secondary | ICD-10-CM | POA: Diagnosis not present

## 2017-02-05 DIAGNOSIS — K921 Melena: Secondary | ICD-10-CM | POA: Diagnosis not present

## 2017-02-05 DIAGNOSIS — F329 Major depressive disorder, single episode, unspecified: Secondary | ICD-10-CM | POA: Diagnosis not present

## 2017-02-05 DIAGNOSIS — I4819 Other persistent atrial fibrillation: Secondary | ICD-10-CM

## 2017-02-05 DIAGNOSIS — Z8719 Personal history of other diseases of the digestive system: Secondary | ICD-10-CM | POA: Diagnosis not present

## 2017-02-05 DIAGNOSIS — F0390 Unspecified dementia without behavioral disturbance: Secondary | ICD-10-CM | POA: Diagnosis not present

## 2017-02-05 DIAGNOSIS — D500 Iron deficiency anemia secondary to blood loss (chronic): Secondary | ICD-10-CM

## 2017-02-05 DIAGNOSIS — I481 Persistent atrial fibrillation: Secondary | ICD-10-CM

## 2017-02-05 DIAGNOSIS — I110 Hypertensive heart disease with heart failure: Secondary | ICD-10-CM | POA: Diagnosis not present

## 2017-02-05 HISTORY — DX: Iron deficiency anemia secondary to blood loss (chronic): D50.0

## 2017-02-05 LAB — COMPREHENSIVE METABOLIC PANEL
ALBUMIN: 2.9 g/dL — AB (ref 3.5–5.0)
ALK PHOS: 120 U/L (ref 38–126)
ALT: 58 U/L — AB (ref 14–54)
AST: 84 U/L — ABNORMAL HIGH (ref 15–41)
Anion gap: 7 (ref 5–15)
BUN: 28 mg/dL — ABNORMAL HIGH (ref 6–20)
CALCIUM: 8.2 mg/dL — AB (ref 8.9–10.3)
CO2: 28 mmol/L (ref 22–32)
CREATININE: 1.16 mg/dL — AB (ref 0.44–1.00)
Chloride: 96 mmol/L — ABNORMAL LOW (ref 101–111)
GFR calc Af Amer: 46 mL/min — ABNORMAL LOW (ref >60)
GFR calc non Af Amer: 39 mL/min — ABNORMAL LOW (ref >60)
GLUCOSE: 128 mg/dL — AB (ref 65–99)
Potassium: 4 mmol/L (ref 3.5–5.1)
SODIUM: 131 mmol/L — AB (ref 135–145)
Total Bilirubin: 0.5 mg/dL (ref 0.3–1.2)
Total Protein: 6.2 g/dL — ABNORMAL LOW (ref 6.5–8.1)

## 2017-02-05 LAB — CBC WITH DIFFERENTIAL/PLATELET
Basophils Absolute: 0.1 10*3/uL (ref 0–0.1)
Basophils Relative: 1 %
EOS PCT: 1 %
Eosinophils Absolute: 0.1 10*3/uL (ref 0–0.7)
HCT: 24.1 % — ABNORMAL LOW (ref 35.0–47.0)
HEMOGLOBIN: 7.9 g/dL — AB (ref 12.0–16.0)
LYMPHS ABS: 1.4 10*3/uL (ref 1.0–3.6)
Lymphocytes Relative: 13 %
MCH: 25.8 pg — AB (ref 26.0–34.0)
MCHC: 32.9 g/dL (ref 32.0–36.0)
MCV: 78.5 fL — AB (ref 80.0–100.0)
MONOS PCT: 6 %
Monocytes Absolute: 0.6 10*3/uL (ref 0.2–0.9)
Neutro Abs: 8.4 10*3/uL — ABNORMAL HIGH (ref 1.4–6.5)
Neutrophils Relative %: 79 %
PLATELETS: 264 10*3/uL (ref 150–440)
RBC: 3.07 MIL/uL — AB (ref 3.80–5.20)
RDW: 17.9 % — ABNORMAL HIGH (ref 11.5–14.5)
WBC: 10.7 10*3/uL (ref 3.6–11.0)

## 2017-02-05 LAB — IRON AND TIBC
Iron: 16 ug/dL — ABNORMAL LOW (ref 28–170)
Saturation Ratios: 4 % — ABNORMAL LOW (ref 10.4–31.8)
TIBC: 366 ug/dL (ref 250–450)
UIBC: 350 ug/dL

## 2017-02-05 LAB — RETICULOCYTES
RBC.: 3.14 MIL/uL — ABNORMAL LOW (ref 3.80–5.20)
Retic Count, Absolute: 78.5 10*3/uL (ref 19.0–183.0)
Retic Ct Pct: 2.5 % (ref 0.4–3.1)

## 2017-02-05 LAB — FERRITIN: Ferritin: 20 ng/mL (ref 11–307)

## 2017-02-05 NOTE — Progress Notes (Signed)
[facility] Initial Visit:  Patient Care Team: Tracie Harrier, MD as PCP - General (Internal Medicine) Minna Merritts, MD as Consulting Physician (Cardiology)  CHIEF COMPLAINTS/PURPOSE OF CONSULTATION: Low blood count.   HISTORY OF PRESENTING ILLNESS: Kathryn Short 83 y.o. female with PMH listed as below, is urgently referred by PCP and [facility] NP Joelene Millin today for same day visit for evaluation of acute on chronic anemia.  Patient is accompanied by her daughter who provide most information. Patient is hard hearing and poor historian. Per daughter patient has been on Eliquis for a year due to A-Fib. Last Friday, daughter noticed black blood on patient's pad , and later little bit bright red blood mixed with her loose stools (took lactulose). Patient has been feeling more fatigued lately, SOB more than her baseline. Appetite is poor. Eliquis was held, last dose on 12/28/2016.  Patient has labs done earlier this month which showed Hb in 10s. 01/01/2017 when patient was seen by [facility], she had a repeat CBC done which showed acute drop of 2 grams of Hb. Hemoccult was positive with brown stool. A CT abdomen with oral contrast was ordered and scheduled at 3PM today. I was called by NP to see if we can evaluate patient urgently for the acute drop of anemia.  Per daughter patient has a remote history of uterine cancer in 1952, treated with RT.   INTERVAL HISTORY During interval, she received blood transfusion during her last hospitalization. Her Eliquis was initially discontinued. After she was evaluated by cardiology, the decision was made to restart on Eliquis 2.5 mg twice a day due to the high risk A. Fib. She was also seen by gynonc Dr. Leonides Schanz while she was in the hospital, vaginal exam showed a shortened vaginal, no blood or area of excoriation. Soft tissue mass was considered to be stable for years and likely the cause of her bleeding or a new recurrence. Patient  currently takes oral iron supplementation. Per daughter for stool color turns black and very hard to distinguish from blood in the stool. Patient takes lactulose for constipation. She reports feeling no energy, fatigue. She does not walk much. There is no report of active rectal bleeding.  Review of Systems  Constitutional: Positive for fatigue.  HENT:   Positive for hearing loss.   Eyes: Negative.   Respiratory: Positive for shortness of breath.   Cardiovascular: Negative.   Gastrointestinal:       Rectal bleeding  Endocrine: Negative.   Genitourinary: Negative.    Musculoskeletal: Negative.   Skin: Negative.   Neurological: Negative.   Hematological: Negative.   Psychiatric/Behavioral: Negative.     MEDICAL HISTORY: Past Medical History:  Diagnosis Date  . Atrial fibrillation ([place])   . Bowel obstruction ([place])   . CHF (congestive heart failure) ([place])   . Fall   . GERD (gastroesophageal reflux disease)   . Hypertension   . Hypothyroidism    Treated  . Iron deficiency anemia due to chronic blood loss 02/05/2017  . Iron deficiency anemia due to chronic blood loss 02/05/2017  . Melanoma ([place])    Resected from back  . OSA (obstructive sleep apnea)    On BiPAP  . Uterine cancer [facility])    s/p radiation therapy in 1952    SURGICAL HISTORY: Past Surgical History:  Procedure Laterality Date  . CHOLECYSTECTOMY    . COLOSTOMY     For perforation  . Cytoscopy    . EP IMPLANTABLE  DEVICE N/A 09/07/2015   Procedure:  PPM Generator Changeout;  Surgeon: Deboraha Sprang, MD;  Location: [facility];  Service: Cardiovascular;  Laterality: N/A;  . INSERT / REPLACE / REMOVE PACEMAKER     Implantation-[employer]  . SKIN BIOPSY     face  . TOTAL ABDOMINAL HYSTERECTOMY W/ BILATERAL SALPINGOOPHORECTOMY      SOCIAL HISTORY: Social History   Social History  . Marital status: Widowed    Spouse name: N/A  . Number of children: 5  . Years of education: N/A   Occupational History   .  Retired   Social History Main Topics  . Smoking status: Never Smoker  . Smokeless tobacco: Never Used     Comment: Tobacco use-no  . Alcohol use No  . Drug use: No  . Sexual activity: Not on file   Other Topics Concern  . Not on file   Social History Narrative  . No narrative on file    FAMILY HISTORY Family History  Problem Relation Age of Onset  . Family history unknown: Yes    ALLERGIES:  is allergic to ace inhibitors; ambien [zolpidem tartrate]; bactrim [sulfamethoxazole-trimethoprim]; digoxin and related; hctz [hydrochlorothiazide]; and lunesta [eszopiclone].  MEDICATIONS:  Current Outpatient Prescriptions  Medication Sig Dispense Refill  . acetaminophen (TYLENOL) 500 MG tablet Take 1,000 mg by mouth daily as needed for mild pain or headache.     . albuterol (PROVENTIL HFA;VENTOLIN HFA) 108 (90 BASE) MCG/ACT inhaler Inhale 2 puffs into the lungs every 6 (six) hours as needed for wheezing or shortness of breath.     Marland Kitchen amiodarone (PACERONE) 200 MG tablet Take 1 tablet (200 mg total) by mouth daily. 90 tablet 3  . apixaban (ELIQUIS) 2.5 MG TABS tablet Take 1 tablet (2.5 mg) by mouth twice daily 60 tablet 6  . b complex vitamins tablet Take 1 tablet by mouth daily.    . Carboxymethylcellul-Glycerin (LUBRICATING EYE DROPS OP) Place 2 drops into both eyes 2 (two) times daily.    . cefUROXime (CEFTIN) 250 MG tablet Take 250 mg by mouth daily as needed.     . clonazePAM (KLONOPIN) 0.5 MG tablet Take 0.5 mg by mouth 2 (two) times daily as needed for anxiety.    . cloNIDine (CATAPRES) 0.1 MG tablet Take 1 tablet (0.1 mg total) by mouth as needed. 60 tablet 6  . iron polysaccharides (NIFEREX) 150 MG capsule Take 150 mg by mouth daily.    Marland Kitchen lactulose (CHRONULAC) 10 GM/15ML solution Take 30 g by mouth daily.     Marland Kitchen levothyroxine (SYNTHROID, LEVOTHROID) 75 MCG tablet Take 75 mcg by mouth daily.    Marland Kitchen losartan (COZAAR) 100 MG tablet Take 1 tablet (100 mg total) by mouth daily as  needed. 30 tablet 6  . metoprolol (LOPRESSOR) 50 MG tablet Take 1 tablet (50 mg total) by mouth 2 (two) times daily. 180 tablet 3  . montelukast (SINGULAIR) 10 MG tablet Take 10 mg by mouth at bedtime.    . Multiple Vitamin (MULTIVITAMIN) tablet Take 1 tablet by mouth daily.    . Multiple Vitamins-Minerals (PRESERVISION AREDS 2 PO) Take by mouth 2 (two) times daily.    . nitroGLYCERIN (NITROSTAT) 0.4 MG SL tablet Place 1 tablet (0.4 mg total) under the tongue every 5 (five) minutes as needed for chest pain. 25 tablet 6  . omeprazole (PRILOSEC) 20 MG capsule Take 20 mg by mouth 2 (two) times daily before a meal.    . potassium chloride (K-DUR,KLOR-CON)  10 MEQ tablet Take 1 tablet (10 mEq total) by mouth daily as needed. (Patient taking differently: Take 10 mEq by mouth daily. ) 90 tablet 3  . Probiotic Product (PROBIOTIC DAILY PO) Take 1 capsule by mouth daily.     . sertraline (ZOLOFT) 50 MG tablet Take 50 mg by mouth daily.    Marland Kitchen spironolactone (ALDACTONE) 25 MG tablet Take 25 mg by mouth every other day.     . torsemide (DEMADEX) 20 MG tablet Take 1 tablet (20 mg total) by mouth 2 (two) times daily as needed. 180 tablet 3  . Vitamin D, Ergocalciferol, (DRISDOL) 50000 units CAPS capsule Take 50,000 Units by mouth every 7 (seven) days.     No current facility-administered medications for this visit.     PHYSICAL EXAMINATION:  ECOG PERFORMANCE STATUS: 2 - Symptomatic, <50% confined to bed   Vitals:   02/05/17 1022  BP: 103/64  Pulse: 60  Resp: 16  Temp: (!) 96.9 F (36.1 C)    Filed Weights   02/05/17 1022  Weight: 142 lb 3 oz (64.5 kg)     Physical Exam GENERAL: No distress, well nourished. pale SKIN:  No rashes or significant lesions  HEAD: Normocephalic, No masses, lesions, tenderness or abnormalities  EYES: . non icteric, pale conjunctivae  ENT: External ears normal ,lips , buccal mucosa, and tongue normal and mucous membranes are moist  LYMPH: No palpable cervical and  axillary lymphadenopathy  LUNGS: Clear to auscultation, no crackles or wheezes HEART: Regular rate & rhythm, no murmurs, no gallops, S1 normal and S2 normal. Bilateral 3+ lower extremity edema.  ABDOMEN: Abdomen soft, non-tender, normal bowel sounds, I did not appreciate any  masses or organomegaly  MUSCULOSKELETAL: No CVA tenderness and no tenderness on percussion of the back or rib cage.  EXTREMITIES: No edema, no skin discoloration or tenderness NEURO: Alert & oriented, no focal motor/sensory deficits.   LABORATORY DATA: I have personally reviewed the data as listed:  Appointment on 02/05/2017  Component Date Value Ref Range Status  . WBC 02/05/2017 10.7  3.6 - 11.0 K/uL Final  . RBC 02/05/2017 3.07* 3.80 - 5.20 MIL/uL Final  . Hemoglobin 02/05/2017 7.9* 12.0 - 16.0 g/dL Final  . HCT 02/05/2017 24.1* 35.0 - 47.0 % Final  . MCV 02/05/2017 78.5* 80.0 - 100.0 fL Final  . MCH 02/05/2017 25.8* 26.0 - 34.0 pg Final  . MCHC 02/05/2017 32.9  32.0 - 36.0 g/dL Final  . RDW 02/05/2017 17.9* 11.5 - 14.5 % Final  . Platelets 02/05/2017 264  150 - 440 K/uL Final  . Neutrophils Relative % 02/05/2017 79  % Final  . Neutro Abs 02/05/2017 8.4* 1.4 - 6.5 K/uL Final  . Lymphocytes Relative 02/05/2017 13  % Final  . Lymphs Abs 02/05/2017 1.4  1.0 - 3.6 K/uL Final  . Monocytes Relative 02/05/2017 6  % Final  . Monocytes Absolute 02/05/2017 0.6  0.2 - 0.9 K/uL Final  . Eosinophils Relative 02/05/2017 1  % Final  . Eosinophils Absolute 02/05/2017 0.1  0 - 0.7 K/uL Final  . Basophils Relative 02/05/2017 1  % Final  . Basophils Absolute 02/05/2017 0.1  0 - 0.1 K/uL Final  . Retic Ct Pct 02/05/2017 2.5  0.4 - 3.1 % Final  . RBC. 02/05/2017 3.14* 3.80 - 5.20 MIL/uL Final  . Retic Count, Absolute 02/05/2017 78.5  19.0 - 183.0 K/uL Final  . Sodium 02/05/2017 131* 135 - 145 mmol/L Final  . Potassium 02/05/2017 4.0  3.5 -  5.1 mmol/L Final  . Chloride 02/05/2017 96* 101 - 111 mmol/L Final  . CO2 02/05/2017  28  22 - 32 mmol/L Final  . Glucose, Bld 02/05/2017 128* 65 - 99 mg/dL Final  . BUN 02/05/2017 28* 6 - 20 mg/dL Final  . Creatinine, Ser 02/05/2017 1.16* 0.44 - 1.00 mg/dL Final  . Calcium 02/05/2017 8.2* 8.9 - 10.3 mg/dL Final  . Total Protein 02/05/2017 6.2* 6.5 - 8.1 g/dL Final  . Albumin 02/05/2017 2.9* 3.5 - 5.0 g/dL Final  . AST 02/05/2017 84* 15 - 41 U/L Final  . ALT 02/05/2017 58* 14 - 54 U/L Final  . Alkaline Phosphatase 02/05/2017 120  38 - 126 U/L Final  . Total Bilirubin 02/05/2017 0.5  0.3 - 1.2 mg/dL Final  . GFR calc non Af Amer 02/05/2017 39* >60 mL/min Final  . GFR calc Af Amer 02/05/2017 46* >60 mL/min Final   Comment: (NOTE) The eGFR has been calculated using the CKD EPI equation. This calculation has not been validated in all clinical situations. eGFR's persistently <60 mL/min signify possible Chronic Kidney Disease.   . Anion gap 02/05/2017 7  5 - 15 Final  . Iron 02/05/2017 16* 28 - 170 ug/dL Final  . TIBC 02/05/2017 366  250 - 450 ug/dL Final  . Saturation Ratios 02/05/2017 4* 10.4 - 31.8 % Final  . UIBC 02/05/2017 350  ug/dL Final  . Ferritin 02/05/2017 20  11 - 307 ng/mL Final  Office Visit on 01/08/2017  Component Date Value Ref Range Status  . Date Time Interrogation Session 01/08/2017 [id number]   Final  . Pulse Generator Manufacturer 01/08/2017 MERM   Final  . Pulse Gen Model 01/08/2017 [id number] Adapta   Final  . Pulse Gen Serial Number 01/08/2017 [id number]   Final  . Clinic Name 01/08/2017 [facility]   Final  . Implantable Pulse Generator Type 01/08/2017 Implantable Pulse Generator   Final  . Implantable Pulse Generator Implan* 01/08/2017 [id number]   Final  . Implantable Lead Manufacturer 01/08/2017 MERM   Final  . Implantable Lead Model 01/08/2017 5076 CapSureFix Novus   Final  . Implantable Lead Serial Number 01/08/2017 [id number]   Final  . Implantable Lead Implant Date 01/08/2017 [id number]   Final  . Implantable Lead Location Detail 1  01/08/2017 APPENDAGE   Final  . Implantable Lead Location 01/08/2017 [id number]   Final  . Implantable Lead Manufacturer 01/08/2017 MERM   Final  . Implantable Lead Model 01/08/2017 5076 CapSureFix Novus   Final  . Implantable Lead Serial Number 01/08/2017 [id number]   Final  . Implantable Lead Implant Date 01/08/2017 [id number]   Final  . Implantable Lead Location Detail 1 01/08/2017 APEX   Final  . Implantable Lead Location 01/08/2017 [id number]   Final  . Lead Channel Setting Sensing Sensi* 01/08/2017 2.80  mV Final  . Lead Channel Setting Pacing Amplit* 01/08/2017 2.000  V Final  . Lead Channel Setting Pacing Pulse * 01/08/2017 0.64  ms Final  . Lead Channel Setting Pacing Amplit* 01/08/2017 2.500  V Final  . Lead Channel Impedance Value 01/08/2017 356  ohm Final  . Lead Channel Pacing Threshold Ampl* 01/08/2017 1.000  V Final  . Lead Channel Pacing Threshold Puls* 01/08/2017 0.40  ms Final  . Lead Channel Impedance Value 01/08/2017 432  ohm Final  . Lead Channel Sensing Intrinsic Amp* 01/08/2017 8.00  mV Final  . Lead Channel Pacing Threshold Ampl* 01/08/2017 1.250  V Final  . Lead Channel Pacing Threshold Puls* 01/08/2017 0.64  ms Final  . Lead Channel Pacing Threshold Ampl* 01/08/2017 1.500  V Final  . Lead Channel Pacing Threshold Puls* 01/08/2017 0.40  ms Final  . Battery Status 01/08/2017 OK   Final  . Battery Remaining Longevity 01/08/2017 129  mo Final  . Battery Voltage 01/08/2017 2.79  V Final  . Battery Impedance 01/08/2017 127  ohm Final  . Brady Statistic AP VP Percent 01/08/2017 0  % Final  . Brady Statistic AS VP Percent 01/08/2017 0  % Final  . Brady Statistic AP VS Percent 01/08/2017 100  % Final  . Brady Statistic AS VS Percent 01/08/2017 0  % Final  . Eval Rhythm 01/08/2017 Ap/Vs @ 30   Final    RADIOGRAPHIC STUDIES: I have personally reviewed the radiological images as listed and agree with the findings in the report CT abdomen pelvis 07/03/2016.  IMPRESSION: 1.  Abnormal soft tissue mass in the region of the vagina worrisome recurrence of carcinoma. This lesion may invade the adjacent perirectal fat planes and possibly the base of the urinary bladder. 2. Small right pleural effusion. 3. Changes of cirrhosis are again noted. 4. Significant abdominal aortic atherosclerotic change. 5. Small amount of free fluid in the pelvis. 6. Diffuse osteopenia.   ASSESSMENT/PLAN 1. Symptomatic anemia   2. Fatigue associated with anemia   3. Malignant neoplasm of uterus, unspecified site ([place])   4. Iron deficiency anemia due to chronic blood loss   5. Persistent atrial fibrillation ([place])   6. Chronic anticoagulation    #Microcytic anemia, likely due to Chronic  blood loss. Source of bleeding possibly from GI tract. Follow-up with GI.  # Iron deficiency, plan IV iron with Venofer '200mg'$  weekly 4. # Chronic A. fib with high risk of stroke. Continue Eliquis 2.5 mg twice a day. Currently no signs of active bleeding. Monitor counts. Follow-up with cardiology.   Follow up in 5 weeks with repeat CBC, iron TIBC, ferritin.  All questions were answered. The patient knows to call the clinic with any problems, questions or concerns. Thank you for this kind referral and the opportunity to participate in the care of this patient. A copy of today's note will be routed to referring physicians from [facility] and PCP.    Orders Placed This Encounter  Procedures  . CBC with Differential/Platelet    Standing Status:   Future    Standing Expiration Date:   02/05/2018  . Iron and TIBC    Standing Status:   Future    Standing Expiration Date:   02/05/2018  . Ferritin    Standing Status:   Future    Standing Expiration Date:   02/05/2018   Total face to face encounter time for this patient visit was 40 min. >50% of the time was  spent in counseling and coordination of care.  Earlie Server, MD  02/05/2017 1:04 PM

## 2017-02-05 NOTE — Progress Notes (Signed)
Patient here today for follow up.   

## 2017-02-07 DIAGNOSIS — I5032 Chronic diastolic (congestive) heart failure: Secondary | ICD-10-CM | POA: Diagnosis not present

## 2017-02-07 DIAGNOSIS — R8290 Unspecified abnormal findings in urine: Secondary | ICD-10-CM | POA: Diagnosis not present

## 2017-02-07 DIAGNOSIS — F329 Major depressive disorder, single episode, unspecified: Secondary | ICD-10-CM | POA: Diagnosis not present

## 2017-02-07 DIAGNOSIS — K7460 Unspecified cirrhosis of liver: Secondary | ICD-10-CM | POA: Diagnosis not present

## 2017-02-07 DIAGNOSIS — D500 Iron deficiency anemia secondary to blood loss (chronic): Secondary | ICD-10-CM | POA: Diagnosis not present

## 2017-02-07 DIAGNOSIS — M6281 Muscle weakness (generalized): Secondary | ICD-10-CM | POA: Diagnosis not present

## 2017-02-07 DIAGNOSIS — I2510 Atherosclerotic heart disease of native coronary artery without angina pectoris: Secondary | ICD-10-CM | POA: Diagnosis not present

## 2017-02-07 DIAGNOSIS — I110 Hypertensive heart disease with heart failure: Secondary | ICD-10-CM | POA: Diagnosis not present

## 2017-02-07 DIAGNOSIS — I480 Paroxysmal atrial fibrillation: Secondary | ICD-10-CM | POA: Diagnosis not present

## 2017-02-07 DIAGNOSIS — F0390 Unspecified dementia without behavioral disturbance: Secondary | ICD-10-CM | POA: Diagnosis not present

## 2017-02-08 ENCOUNTER — Other Ambulatory Visit: Payer: Self-pay | Admitting: Physician Assistant

## 2017-02-08 ENCOUNTER — Inpatient Hospital Stay: Payer: Medicare HMO

## 2017-02-08 VITALS — BP 109/50 | HR 60 | Temp 96.6°F | Resp 15

## 2017-02-08 DIAGNOSIS — K219 Gastro-esophageal reflux disease without esophagitis: Secondary | ICD-10-CM | POA: Diagnosis not present

## 2017-02-08 DIAGNOSIS — G4730 Sleep apnea, unspecified: Secondary | ICD-10-CM | POA: Diagnosis not present

## 2017-02-08 DIAGNOSIS — K921 Melena: Secondary | ICD-10-CM | POA: Diagnosis not present

## 2017-02-08 DIAGNOSIS — D649 Anemia, unspecified: Secondary | ICD-10-CM

## 2017-02-08 DIAGNOSIS — I509 Heart failure, unspecified: Secondary | ICD-10-CM | POA: Diagnosis not present

## 2017-02-08 DIAGNOSIS — K7460 Unspecified cirrhosis of liver: Secondary | ICD-10-CM | POA: Diagnosis not present

## 2017-02-08 DIAGNOSIS — I10 Essential (primary) hypertension: Secondary | ICD-10-CM | POA: Diagnosis not present

## 2017-02-08 DIAGNOSIS — E039 Hypothyroidism, unspecified: Secondary | ICD-10-CM | POA: Diagnosis not present

## 2017-02-08 DIAGNOSIS — D500 Iron deficiency anemia secondary to blood loss (chronic): Secondary | ICD-10-CM

## 2017-02-08 DIAGNOSIS — I4891 Unspecified atrial fibrillation: Secondary | ICD-10-CM | POA: Diagnosis not present

## 2017-02-08 MED ORDER — SODIUM CHLORIDE 0.9 % IV SOLN
Freq: Once | INTRAVENOUS | Status: AC
  Administered 2017-02-08: 14:00:00 via INTRAVENOUS
  Filled 2017-02-08: qty 1000

## 2017-02-08 MED ORDER — IRON SUCROSE 20 MG/ML IV SOLN
200.0000 mg | Freq: Once | INTRAVENOUS | Status: AC
  Administered 2017-02-08: 200 mg via INTRAVENOUS
  Filled 2017-02-08: qty 10

## 2017-02-12 DIAGNOSIS — K7460 Unspecified cirrhosis of liver: Secondary | ICD-10-CM | POA: Diagnosis not present

## 2017-02-12 DIAGNOSIS — F0390 Unspecified dementia without behavioral disturbance: Secondary | ICD-10-CM | POA: Diagnosis not present

## 2017-02-12 DIAGNOSIS — I110 Hypertensive heart disease with heart failure: Secondary | ICD-10-CM | POA: Diagnosis not present

## 2017-02-12 DIAGNOSIS — F329 Major depressive disorder, single episode, unspecified: Secondary | ICD-10-CM | POA: Diagnosis not present

## 2017-02-12 DIAGNOSIS — I480 Paroxysmal atrial fibrillation: Secondary | ICD-10-CM | POA: Diagnosis not present

## 2017-02-12 DIAGNOSIS — I2510 Atherosclerotic heart disease of native coronary artery without angina pectoris: Secondary | ICD-10-CM | POA: Diagnosis not present

## 2017-02-12 DIAGNOSIS — D500 Iron deficiency anemia secondary to blood loss (chronic): Secondary | ICD-10-CM | POA: Diagnosis not present

## 2017-02-12 DIAGNOSIS — I5032 Chronic diastolic (congestive) heart failure: Secondary | ICD-10-CM | POA: Diagnosis not present

## 2017-02-12 DIAGNOSIS — M6281 Muscle weakness (generalized): Secondary | ICD-10-CM | POA: Diagnosis not present

## 2017-02-14 DIAGNOSIS — I480 Paroxysmal atrial fibrillation: Secondary | ICD-10-CM | POA: Diagnosis not present

## 2017-02-14 DIAGNOSIS — K7460 Unspecified cirrhosis of liver: Secondary | ICD-10-CM | POA: Diagnosis not present

## 2017-02-14 DIAGNOSIS — F0390 Unspecified dementia without behavioral disturbance: Secondary | ICD-10-CM | POA: Diagnosis not present

## 2017-02-14 DIAGNOSIS — D500 Iron deficiency anemia secondary to blood loss (chronic): Secondary | ICD-10-CM | POA: Diagnosis not present

## 2017-02-14 DIAGNOSIS — I5032 Chronic diastolic (congestive) heart failure: Secondary | ICD-10-CM | POA: Diagnosis not present

## 2017-02-14 DIAGNOSIS — I110 Hypertensive heart disease with heart failure: Secondary | ICD-10-CM | POA: Diagnosis not present

## 2017-02-14 DIAGNOSIS — F329 Major depressive disorder, single episode, unspecified: Secondary | ICD-10-CM | POA: Diagnosis not present

## 2017-02-14 DIAGNOSIS — M6281 Muscle weakness (generalized): Secondary | ICD-10-CM | POA: Diagnosis not present

## 2017-02-14 DIAGNOSIS — I2510 Atherosclerotic heart disease of native coronary artery without angina pectoris: Secondary | ICD-10-CM | POA: Diagnosis not present

## 2017-02-15 ENCOUNTER — Inpatient Hospital Stay: Payer: Medicare HMO | Attending: Oncology

## 2017-02-15 VITALS — BP 116/65 | HR 58 | Resp 18

## 2017-02-15 DIAGNOSIS — D649 Anemia, unspecified: Secondary | ICD-10-CM

## 2017-02-15 DIAGNOSIS — D500 Iron deficiency anemia secondary to blood loss (chronic): Secondary | ICD-10-CM

## 2017-02-15 DIAGNOSIS — Z79899 Other long term (current) drug therapy: Secondary | ICD-10-CM | POA: Diagnosis not present

## 2017-02-15 MED ORDER — IRON SUCROSE 20 MG/ML IV SOLN
200.0000 mg | Freq: Once | INTRAVENOUS | Status: AC
  Administered 2017-02-15: 200 mg via INTRAVENOUS
  Filled 2017-02-15: qty 10

## 2017-02-15 MED ORDER — SODIUM CHLORIDE 0.9 % IV SOLN: 200.0000 mg | Freq: Once | INTRAVENOUS | Status: DC

## 2017-02-15 MED ORDER — SODIUM CHLORIDE 0.9 % IV SOLN
Freq: Once | INTRAVENOUS | Status: AC
  Administered 2017-02-15: 14:00:00 via INTRAVENOUS
  Filled 2017-02-15: qty 1000

## 2017-02-18 DIAGNOSIS — I5032 Chronic diastolic (congestive) heart failure: Secondary | ICD-10-CM | POA: Diagnosis not present

## 2017-02-18 DIAGNOSIS — M6281 Muscle weakness (generalized): Secondary | ICD-10-CM | POA: Diagnosis not present

## 2017-02-18 DIAGNOSIS — D500 Iron deficiency anemia secondary to blood loss (chronic): Secondary | ICD-10-CM | POA: Diagnosis not present

## 2017-02-18 DIAGNOSIS — M810 Age-related osteoporosis without current pathological fracture: Secondary | ICD-10-CM | POA: Diagnosis not present

## 2017-02-18 DIAGNOSIS — I110 Hypertensive heart disease with heart failure: Secondary | ICD-10-CM | POA: Diagnosis not present

## 2017-02-19 DIAGNOSIS — F0390 Unspecified dementia without behavioral disturbance: Secondary | ICD-10-CM | POA: Diagnosis not present

## 2017-02-19 DIAGNOSIS — I110 Hypertensive heart disease with heart failure: Secondary | ICD-10-CM | POA: Diagnosis not present

## 2017-02-19 DIAGNOSIS — F329 Major depressive disorder, single episode, unspecified: Secondary | ICD-10-CM | POA: Diagnosis not present

## 2017-02-19 DIAGNOSIS — I5032 Chronic diastolic (congestive) heart failure: Secondary | ICD-10-CM | POA: Diagnosis not present

## 2017-02-19 DIAGNOSIS — M6281 Muscle weakness (generalized): Secondary | ICD-10-CM | POA: Diagnosis not present

## 2017-02-19 DIAGNOSIS — K7460 Unspecified cirrhosis of liver: Secondary | ICD-10-CM | POA: Diagnosis not present

## 2017-02-19 DIAGNOSIS — I480 Paroxysmal atrial fibrillation: Secondary | ICD-10-CM | POA: Diagnosis not present

## 2017-02-19 DIAGNOSIS — I2510 Atherosclerotic heart disease of native coronary artery without angina pectoris: Secondary | ICD-10-CM | POA: Diagnosis not present

## 2017-02-19 DIAGNOSIS — D500 Iron deficiency anemia secondary to blood loss (chronic): Secondary | ICD-10-CM | POA: Diagnosis not present

## 2017-02-19 NOTE — Progress Notes (Deleted)
Cardiology Office Note  Date:  02/19/2017   ID:  Kathryn Short, DOB May 11, 1922, MRN [id number]  PCP:  Tracie Harrier, MD   No chief complaint on file.   HPI:  Kathryn Short is an 83 year old woman with  paroxysmal atrial fibrillation (last episode in March 6606),  diastolic CHF,  on a  diuretic daily, labile hypertension,  sick sinus syndrome, pacemaker placement followed by Dr. Caryl Comes,  in hospital [facility] on 02/16/2012 in atrial fibrillation.  open cholecystectomy, partial colon resection and colostomy, appendectomy, hysterectomy, oophorectomy, GYN malignancy treated with radiation in the 1950s bowel obstruction December 2015 She presents for routine follow-up of her blood pressure, diastolic CHF and atrial fibrillation  Recent hospitalization for GI bleed Blood in the toilet, went to the hospital Was anemic Required transfusion 1. Hemoglobin down to 7.5 known soft tissue mass in vaginal area  eliquis stopped , started on asa 325 at discharge   She presents with her daughter on today's visit She has leg swelling Pleural effusion noted in the hospital on x-ray  In follow-up today percent in a wheelchair, in good spirits,  less communicative, likely from hard of hearing Trace to 1+ leg swelling Has been taking torsemide 20 g daily She is taking iron  Takes clonidine and losartan as needed, not regular   Aldactone every other day Ambulating, no recent falls, unsteady gait  Seen by Dr. Caryl Comes on today's visit  LABS:Reviewed with her in detail on today's visit HCT 32 Cr 1.2 TSH 11 down to 8  no significant shortness of breath Working with physical therapy  EKG personally reviewed by myself on todays visit Shows paced rhythm 70 bpm no significant ST or T-wave changes  Other past medical history reviewed  On previous office visit she reported having symptoms of Vomiting, abdominal cramps Went to the hospital and had CT ABD:  aortic atherosclerotic Questionable mass  but was seen by OB/GYN and was cleared  Lab work  05/01/2016 CR 1.2,  UTI, took ABX Enterococcus, sen to nitrofirantoin total chol 207, LDL 112,  Lab work January 2018 creatinine 1.3 Most recent creatinine 1.2 Chronic urinary tract infection TSH 11  EKG on today's visit shows paced rhythm, atrial paced, 61 bpm   Other past medical history clinic 8/9, Noted to have increased atrial fibrillation burden on pacer download January 03 2016 recommended she increase her amiodarone, is on anticoagulation Low BP, head fullness Recommended that she restart her Aldactone as blood pressure improves  Seen by Dr. Caryl Comes August 15, recommended she increase her torsemide for several days Seen by primary care 8/15: urine UTI No Urine cx available to date BMP shows creatinine continues to run higher consistent with prerenal state Taking torsemide daily with 1.5 torsemide on weekends, Aldactone added every other day  Previous CT scan done by primary care showing small to moderate right pleural effusion, evidence of cirrhosis Mention of cirrhosis back on CT scan in 2015 Mildly elevated LFTs over the past year  in the hospital 05/25/2014, with small bowel obstruction. She was placed on bowel rest, IV fluids and she passed her bowels. She did not require surgery  Echocardiogram done in the hospital 05/25/2014 showing ejection fraction greater than 55%, moderate pulmonary hypertension, mild to moderate TR  Earlier in 2015, she had atrial tachycardia and pacer was tracking the atrial rhythm, Her device was reprogrammed in the office for DDI  She was continued on her beta blocker and amiodarone and rhythm broke, converting back to normal sinus  rhythm. Pacemaker has been since changed back to her prior settings  She is reluctant to use a walker, still uses a cane. Anticoagulation discussed previouslywith the patient and her daughter. They are not interested. They preferred to stay on  aspirin  history of falls with trauma requiring stitches. None recently Patient and daughter are afraid of her fall risk, other complications from anticoagulation. Currently not on anticoagulation at their request. They are aware of risks and benefits associated with arrhythmia and stroke.  Previous lab work; Total cholesterol 189, LDL 104, hematocrit 38, creatinine 0.8  previous visits to the emergency room with chest pain, dizziness, malaise. She had a problem on isosorbide in the past. Feelings of insomnia, and anxious feeling, chest pain episodes at night that wake her from sleep, weakness, nausea, dizziness. She has obstructive sleep apnea and does not use her BiPAP at nighttime  Echocardiogram in the hospital showed ejection fraction 50-55%, moderate LVH, mild to moderate TR, right ventricular systolic pressure 40 mm of mercury, mild to moderate aortic valve insufficiency  PMH:   has a past medical history of Atrial fibrillation ([place]); Bowel obstruction (HCC); CHF (congestive heart failure) ([place]); Fall; GERD (gastroesophageal reflux disease); Hypertension; Hypothyroidism; Iron deficiency anemia due to chronic blood loss (02/05/2017); Iron deficiency anemia due to chronic blood loss (02/05/2017); Melanoma ([place]); OSA (obstructive sleep apnea); and Uterine cancer ([place]).  PSH:    Past Surgical History:  Procedure Laterality Date  . CHOLECYSTECTOMY    . COLOSTOMY     For perforation  . Cytoscopy    . EP IMPLANTABLE DEVICE N/A 09/07/2015   Procedure:  PPM Generator Changeout;  Surgeon: Deboraha Sprang, MD;  Location: [place] [facility];  Service: Cardiovascular;  Laterality: N/A;  . INSERT / REPLACE / REMOVE PACEMAKER     Implantation-[employer]  . SKIN BIOPSY     face  . TOTAL ABDOMINAL HYSTERECTOMY W/ BILATERAL SALPINGOOPHORECTOMY      Current Outpatient Prescriptions  Medication Sig Dispense Refill  . acetaminophen (TYLENOL) 500 MG tablet Take 1,000 mg by mouth daily as needed for  mild pain or headache.     . albuterol (PROVENTIL HFA;VENTOLIN HFA) 108 (90 BASE) MCG/ACT inhaler Inhale 2 puffs into the lungs every 6 (six) hours as needed for wheezing or shortness of breath.     Marland Kitchen amiodarone (PACERONE) 200 MG tablet TAKE ONE (1) TABLET BY MOUTH EVERY DAY 90 tablet 3  . apixaban (ELIQUIS) 2.5 MG TABS tablet Take 1 tablet (2.5 mg) by mouth twice daily 60 tablet 6  . b complex vitamins tablet Take 1 tablet by mouth daily.    . Carboxymethylcellul-Glycerin (LUBRICATING EYE DROPS OP) Place 2 drops into both eyes 2 (two) times daily.    . cefUROXime (CEFTIN) 250 MG tablet Take 250 mg by mouth daily as needed.     . clonazePAM (KLONOPIN) 0.5 MG tablet Take 0.5 mg by mouth 2 (two) times daily as needed for anxiety.    . cloNIDine (CATAPRES) 0.1 MG tablet Take 1 tablet (0.1 mg total) by mouth as needed. 60 tablet 6  . iron polysaccharides (NIFEREX) 150 MG capsule Take 150 mg by mouth daily.    Marland Kitchen lactulose (CHRONULAC) 10 GM/15ML solution Take 30 g by mouth daily.     Marland Kitchen levothyroxine (SYNTHROID, LEVOTHROID) 75 MCG tablet Take 75 mcg by mouth daily.    Marland Kitchen losartan (COZAAR) 100 MG tablet Take 1 tablet (100 mg total) by mouth daily as needed. 30 tablet 6  . metoprolol (  LOPRESSOR) 50 MG tablet Take 1 tablet (50 mg total) by mouth 2 (two) times daily. 180 tablet 3  . montelukast (SINGULAIR) 10 MG tablet Take 10 mg by mouth at bedtime.    . Multiple Vitamin (MULTIVITAMIN) tablet Take 1 tablet by mouth daily.    . Multiple Vitamins-Minerals (PRESERVISION AREDS 2 PO) Take by mouth 2 (two) times daily.    . nitroGLYCERIN (NITROSTAT) 0.4 MG SL tablet Place 1 tablet (0.4 mg total) under the tongue every 5 (five) minutes as needed for chest pain. 25 tablet 6  . omeprazole (PRILOSEC) 20 MG capsule Take 20 mg by mouth 2 (two) times daily before a meal.    . potassium chloride (K-DUR,KLOR-CON) 10 MEQ tablet Take 1 tablet (10 mEq total) by mouth daily as needed. (Patient taking differently: Take 10 mEq  by mouth daily. ) 90 tablet 3  . Probiotic Product (PROBIOTIC DAILY PO) Take 1 capsule by mouth daily.     . sertraline (ZOLOFT) 50 MG tablet Take 50 mg by mouth daily.    Marland Kitchen spironolactone (ALDACTONE) 25 MG tablet Take 25 mg by mouth every other day.     . torsemide (DEMADEX) 20 MG tablet Take 1 tablet (20 mg total) by mouth 2 (two) times daily as needed. 180 tablet 3  . Vitamin D, Ergocalciferol, (DRISDOL) 50000 units CAPS capsule Take 50,000 Units by mouth every 7 (seven) days.     No current facility-administered medications for this visit.      Allergies:   Ace inhibitors; Ambien [zolpidem tartrate]; Bactrim [sulfamethoxazole-trimethoprim]; Digoxin and related; Hctz [hydrochlorothiazide]; and Lunesta [eszopiclone]   Social History:  The patient  reports that she has never smoked. She has never used smokeless tobacco. She reports that she does not drink alcohol or use drugs.   Family History:   Family history is unknown by patient.    Review of Systems: Review of Systems  Constitutional: Negative.   Respiratory: Negative.   Cardiovascular: Positive for leg swelling.  Gastrointestinal: Negative.   Musculoskeletal: Negative.   Neurological: Negative.   Psychiatric/Behavioral: Negative.   All other systems reviewed and are negative.    PHYSICAL EXAM: VS:  There were no vitals taken for this visit. , BMI There is no height or weight on file to calculate BMI. GEN: Well nourished, well developed, in no acute distress , Hard of hearing, presents in a wheelchair  HEENT: normal  Neck: no JVD, carotid bruits, or masses Cardiac: RRR; no murmurs, rubs, or gallops, 1+ pitting edema  bilateral lower extremities to the mid shins Respiratory:  clear to auscultation bilaterally, normal work of breathing GI: soft, nontender, nondistended, + BS MS: no deformity or atrophy  Skin: warm and dry, no rash Neuro:  Strength and sensation are intact Psych: euthymic mood, full affect    Recent  Labs: 01/02/2017: TSH 9.127 02/05/2017: ALT 58; BUN 28; Creatinine, Ser 1.16; Hemoglobin 7.9; Platelets 264; Potassium 4.0; Sodium 131    Lipid Panel No results found for: CHOL, HDL, LDLCALC, TRIG    Wt Readings from Last 3 Encounters:  02/05/17 142 lb 3 oz (64.5 kg)  01/08/17 144 lb 8 oz (65.5 kg)  01/08/17 144 lb 8 oz (65.5 kg)       ASSESSMENT AND PLAN:  Essential hypertension - Plan: EKG 12-Lead Blood pressure well controlled, no changes to her medications  Paroxysmal atrial fibrillation (HCC) - Plan: EKG 12-Lead Paced rhythm, tolerating anticoagulation On low-dose amiodarone  Chronotropic incompetence with sinus node dysfunction  (HCC) -  Plan: EKG 12-Lead Atrial paced rhythm, Managed by Dr. Caryl Comes  GI bleed Case discussed with Dr. Caryl Comes on today's visit We will hold aspirin, start low-dose eliquis 2.5 mg twice a day Recommended she discuss iron infusion with primary care through the cancer clinic/infusion center  Chronic diastolic CHF (congestive heart failure) ([place]) - Plan: EKG 12-Lead Torsemide 20 mg daily, consider extra torsemide for several days for worsening leg edema, pleural effusion   Total encounter time more than 25 minutes  Greater than 50% was spent in counseling and coordination of care with the patient   Disposition:   F/U  2 mo   No orders of the defined types were placed in this encounter.    Signed, Esmond Plants, M.D., Ph.D. 02/19/2017  [place], [place]

## 2017-02-20 ENCOUNTER — Ambulatory Visit: Payer: Medicare HMO | Admitting: Cardiovascular Disease

## 2017-02-21 DIAGNOSIS — I480 Paroxysmal atrial fibrillation: Secondary | ICD-10-CM | POA: Diagnosis not present

## 2017-02-21 DIAGNOSIS — K7460 Unspecified cirrhosis of liver: Secondary | ICD-10-CM | POA: Diagnosis not present

## 2017-02-21 DIAGNOSIS — I110 Hypertensive heart disease with heart failure: Secondary | ICD-10-CM | POA: Diagnosis not present

## 2017-02-21 DIAGNOSIS — I2510 Atherosclerotic heart disease of native coronary artery without angina pectoris: Secondary | ICD-10-CM | POA: Diagnosis not present

## 2017-02-21 DIAGNOSIS — I5032 Chronic diastolic (congestive) heart failure: Secondary | ICD-10-CM | POA: Diagnosis not present

## 2017-02-21 DIAGNOSIS — F0390 Unspecified dementia without behavioral disturbance: Secondary | ICD-10-CM | POA: Diagnosis not present

## 2017-02-21 DIAGNOSIS — M6281 Muscle weakness (generalized): Secondary | ICD-10-CM | POA: Diagnosis not present

## 2017-02-21 DIAGNOSIS — F329 Major depressive disorder, single episode, unspecified: Secondary | ICD-10-CM | POA: Diagnosis not present

## 2017-02-21 DIAGNOSIS — D500 Iron deficiency anemia secondary to blood loss (chronic): Secondary | ICD-10-CM | POA: Diagnosis not present

## 2017-02-22 ENCOUNTER — Inpatient Hospital Stay: Payer: Medicare HMO

## 2017-02-22 VITALS — BP 123/70 | HR 60 | Temp 96.9°F | Resp 20

## 2017-02-22 DIAGNOSIS — Z79899 Other long term (current) drug therapy: Secondary | ICD-10-CM | POA: Diagnosis not present

## 2017-02-22 DIAGNOSIS — D500 Iron deficiency anemia secondary to blood loss (chronic): Secondary | ICD-10-CM

## 2017-02-22 DIAGNOSIS — D649 Anemia, unspecified: Secondary | ICD-10-CM

## 2017-02-22 MED ORDER — IRON SUCROSE 20 MG/ML IV SOLN
200.0000 mg | Freq: Once | INTRAVENOUS | Status: AC
  Administered 2017-02-22: 200 mg via INTRAVENOUS
  Filled 2017-02-22: qty 10

## 2017-02-22 MED ORDER — SODIUM CHLORIDE 0.9 % IV SOLN
Freq: Once | INTRAVENOUS | Status: AC
  Administered 2017-02-22: 10:00:00 via INTRAVENOUS
  Filled 2017-02-22: qty 1000

## 2017-02-22 MED ORDER — SODIUM CHLORIDE 0.9 % IV SOLN: 200.0000 mg | Freq: Once | INTRAVENOUS | Status: DC

## 2017-02-25 DIAGNOSIS — F329 Major depressive disorder, single episode, unspecified: Secondary | ICD-10-CM | POA: Diagnosis not present

## 2017-02-25 DIAGNOSIS — F0390 Unspecified dementia without behavioral disturbance: Secondary | ICD-10-CM | POA: Diagnosis not present

## 2017-02-25 DIAGNOSIS — I5032 Chronic diastolic (congestive) heart failure: Secondary | ICD-10-CM | POA: Diagnosis not present

## 2017-02-25 DIAGNOSIS — I2510 Atherosclerotic heart disease of native coronary artery without angina pectoris: Secondary | ICD-10-CM | POA: Diagnosis not present

## 2017-02-25 DIAGNOSIS — K7460 Unspecified cirrhosis of liver: Secondary | ICD-10-CM | POA: Diagnosis not present

## 2017-02-25 DIAGNOSIS — I480 Paroxysmal atrial fibrillation: Secondary | ICD-10-CM | POA: Diagnosis not present

## 2017-02-25 DIAGNOSIS — D500 Iron deficiency anemia secondary to blood loss (chronic): Secondary | ICD-10-CM | POA: Diagnosis not present

## 2017-02-25 DIAGNOSIS — I110 Hypertensive heart disease with heart failure: Secondary | ICD-10-CM | POA: Diagnosis not present

## 2017-02-25 DIAGNOSIS — M6281 Muscle weakness (generalized): Secondary | ICD-10-CM | POA: Diagnosis not present

## 2017-02-26 DIAGNOSIS — H353211 Exudative age-related macular degeneration, right eye, with active choroidal neovascularization: Secondary | ICD-10-CM | POA: Diagnosis not present

## 2017-02-26 DIAGNOSIS — I5032 Chronic diastolic (congestive) heart failure: Secondary | ICD-10-CM | POA: Diagnosis not present

## 2017-02-26 DIAGNOSIS — D500 Iron deficiency anemia secondary to blood loss (chronic): Secondary | ICD-10-CM | POA: Diagnosis not present

## 2017-02-26 DIAGNOSIS — I10 Essential (primary) hypertension: Secondary | ICD-10-CM | POA: Diagnosis not present

## 2017-02-26 DIAGNOSIS — E079 Disorder of thyroid, unspecified: Secondary | ICD-10-CM | POA: Diagnosis not present

## 2017-02-26 DIAGNOSIS — I480 Paroxysmal atrial fibrillation: Secondary | ICD-10-CM | POA: Diagnosis not present

## 2017-02-26 DIAGNOSIS — E785 Hyperlipidemia, unspecified: Secondary | ICD-10-CM | POA: Diagnosis not present

## 2017-02-26 DIAGNOSIS — K7460 Unspecified cirrhosis of liver: Secondary | ICD-10-CM | POA: Diagnosis not present

## 2017-02-28 ENCOUNTER — Observation Stay
Admission: EM | Admit: 2017-02-28 | Discharge: 2017-03-01 | Disposition: A | Discharge status: Home / Self Care | Payer: Medicare HMO | Attending: Internal Medicine | Admitting: Internal Medicine

## 2017-02-28 ENCOUNTER — Encounter: Payer: Self-pay | Admitting: Emergency Medicine

## 2017-02-28 ENCOUNTER — Telehealth: Payer: Self-pay | Admitting: *Deleted

## 2017-02-28 DIAGNOSIS — Z90710 Acquired absence of both cervix and uterus: Secondary | ICD-10-CM | POA: Diagnosis not present

## 2017-02-28 DIAGNOSIS — Z8542 Personal history of malignant neoplasm of other parts of uterus: Secondary | ICD-10-CM | POA: Insufficient documentation

## 2017-02-28 DIAGNOSIS — N899 Noninflammatory disorder of vagina, unspecified: Secondary | ICD-10-CM | POA: Diagnosis not present

## 2017-02-28 DIAGNOSIS — I5032 Chronic diastolic (congestive) heart failure: Secondary | ICD-10-CM | POA: Diagnosis not present

## 2017-02-28 DIAGNOSIS — K922 Gastrointestinal hemorrhage, unspecified: Secondary | ICD-10-CM | POA: Diagnosis not present

## 2017-02-28 DIAGNOSIS — Z7982 Long term (current) use of aspirin: Secondary | ICD-10-CM | POA: Diagnosis not present

## 2017-02-28 DIAGNOSIS — G4733 Obstructive sleep apnea (adult) (pediatric): Secondary | ICD-10-CM | POA: Diagnosis not present

## 2017-02-28 DIAGNOSIS — D62 Acute posthemorrhagic anemia: Secondary | ICD-10-CM | POA: Diagnosis not present

## 2017-02-28 DIAGNOSIS — I110 Hypertensive heart disease with heart failure: Secondary | ICD-10-CM | POA: Insufficient documentation

## 2017-02-28 DIAGNOSIS — I482 Chronic atrial fibrillation: Secondary | ICD-10-CM | POA: Insufficient documentation

## 2017-02-28 DIAGNOSIS — K219 Gastro-esophageal reflux disease without esophagitis: Secondary | ICD-10-CM | POA: Insufficient documentation

## 2017-02-28 DIAGNOSIS — Z882 Allergy status to sulfonamides status: Secondary | ICD-10-CM | POA: Diagnosis not present

## 2017-02-28 DIAGNOSIS — Z79899 Other long term (current) drug therapy: Secondary | ICD-10-CM | POA: Insufficient documentation

## 2017-02-28 DIAGNOSIS — Z7901 Long term (current) use of anticoagulants: Secondary | ICD-10-CM | POA: Insufficient documentation

## 2017-02-28 DIAGNOSIS — D649 Anemia, unspecified: Secondary | ICD-10-CM | POA: Diagnosis present

## 2017-02-28 DIAGNOSIS — Z950 Presence of cardiac pacemaker: Secondary | ICD-10-CM | POA: Diagnosis not present

## 2017-02-28 DIAGNOSIS — Z85820 Personal history of malignant melanoma of skin: Secondary | ICD-10-CM | POA: Insufficient documentation

## 2017-02-28 DIAGNOSIS — D500 Iron deficiency anemia secondary to blood loss (chronic): Secondary | ICD-10-CM

## 2017-02-28 DIAGNOSIS — D509 Iron deficiency anemia, unspecified: Secondary | ICD-10-CM | POA: Diagnosis not present

## 2017-02-28 DIAGNOSIS — E039 Hypothyroidism, unspecified: Secondary | ICD-10-CM | POA: Insufficient documentation

## 2017-02-28 LAB — COMPREHENSIVE METABOLIC PANEL
ALBUMIN: 2.8 g/dL — AB (ref 3.5–5.0)
ALT: 52 U/L (ref 14–54)
ANION GAP: 8 (ref 5–15)
AST: 72 U/L — ABNORMAL HIGH (ref 15–41)
Alkaline Phosphatase: 119 U/L (ref 38–126)
BUN: 35 mg/dL — ABNORMAL HIGH (ref 6–20)
CALCIUM: 8.3 mg/dL — AB (ref 8.9–10.3)
CHLORIDE: 101 mmol/L (ref 101–111)
CO2: 27 mmol/L (ref 22–32)
Creatinine, Ser: 1.32 mg/dL — ABNORMAL HIGH (ref 0.44–1.00)
GFR calc non Af Amer: 34 mL/min — ABNORMAL LOW (ref >60)
GFR, EST AFRICAN AMERICAN: 39 mL/min — AB (ref >60)
GLUCOSE: 118 mg/dL — AB (ref 65–99)
POTASSIUM: 4.1 mmol/L (ref 3.5–5.1)
SODIUM: 136 mmol/L (ref 135–145)
Total Bilirubin: 0.5 mg/dL (ref 0.3–1.2)
Total Protein: 6.2 g/dL — ABNORMAL LOW (ref 6.5–8.1)

## 2017-02-28 LAB — CBC
HEMATOCRIT: 23.6 % — AB (ref 35.0–47.0)
HEMOGLOBIN: 7.6 g/dL — AB (ref 12.0–16.0)
MCH: 27.5 pg (ref 26.0–34.0)
MCHC: 32.2 g/dL (ref 32.0–36.0)
MCV: 85.4 fL (ref 80.0–100.0)
Platelets: 251 10*3/uL (ref 150–440)
RBC: 2.76 MIL/uL — AB (ref 3.80–5.20)
RDW: 25.1 % — ABNORMAL HIGH (ref 11.5–14.5)
WBC: 10.6 10*3/uL (ref 3.6–11.0)

## 2017-02-28 LAB — PROTIME-INR
INR: 1.31
Prothrombin Time: 16.2 seconds — ABNORMAL HIGH (ref 11.4–15.2)

## 2017-02-28 LAB — URINALYSIS, COMPLETE (UACMP) WITH MICROSCOPIC
BILIRUBIN URINE: NEGATIVE
Glucose, UA: NEGATIVE mg/dL
Hgb urine dipstick: NEGATIVE
KETONES UR: NEGATIVE mg/dL
Leukocytes, UA: NEGATIVE
Nitrite: NEGATIVE
PH: 6 (ref 5.0–8.0)
Protein, ur: NEGATIVE mg/dL
SPECIFIC GRAVITY, URINE: 1.013 (ref 1.005–1.030)

## 2017-02-28 LAB — PREPARE RBC (CROSSMATCH)

## 2017-02-28 MED ORDER — CLONAZEPAM 0.5 MG PO TABS
0.5000 mg | ORAL_TABLET | Freq: Two times a day (BID) | ORAL | Status: DC | PRN
  Administered 2017-03-01: 01:00:00 0.5 mg via ORAL
  Filled 2017-02-28: qty 1

## 2017-02-28 MED ORDER — SODIUM CHLORIDE 0.9 % IV SOLN
10.0000 mL/h | Freq: Once | INTRAVENOUS | Status: AC
  Administered 2017-02-28: 17:00:00 10 mL/h via INTRAVENOUS

## 2017-02-28 MED ORDER — AMIODARONE HCL 200 MG PO TABS
200.0000 mg | ORAL_TABLET | Freq: Every day | ORAL | Status: DC
  Administered 2017-03-01: 200 mg via ORAL
  Filled 2017-02-28: qty 1

## 2017-02-28 MED ORDER — ONDANSETRON HCL 4 MG PO TABS: 4.0000 mg | ORAL_TABLET | Freq: Four times a day (QID) | ORAL | Status: DC | PRN

## 2017-02-28 MED ORDER — SPIRONOLACTONE 25 MG PO TABS
25.0000 mg | ORAL_TABLET | ORAL | Status: DC
  Administered 2017-02-28 – 2017-03-01 (×2): 25 mg via ORAL
  Filled 2017-02-28 (×2): qty 1

## 2017-02-28 MED ORDER — NITROGLYCERIN 0.4 MG SL SUBL: 0.4000 mg | SUBLINGUAL_TABLET | SUBLINGUAL | Status: DC | PRN

## 2017-02-28 MED ORDER — ONDANSETRON HCL 4 MG/2ML IJ SOLN: 4.0000 mg | Freq: Four times a day (QID) | INTRAMUSCULAR | Status: DC | PRN

## 2017-02-28 MED ORDER — LACTULOSE 10 GM/15ML PO SOLN
30.0000 g | Freq: Every day | ORAL | Status: DC
  Administered 2017-02-28: 16:00:00 30 g via ORAL
  Filled 2017-02-28: qty 60

## 2017-02-28 MED ORDER — MONTELUKAST SODIUM 10 MG PO TABS
10.0000 mg | ORAL_TABLET | Freq: Every day | ORAL | Status: DC
  Administered 2017-02-28: 10 mg via ORAL
  Filled 2017-02-28: qty 1

## 2017-02-28 MED ORDER — OCUVITE-LUTEIN PO CAPS
1.0000 | ORAL_CAPSULE | Freq: Two times a day (BID) | ORAL | Status: DC
  Administered 2017-02-28 – 2017-03-01 (×2): 1 via ORAL
  Filled 2017-02-28 (×3): qty 1

## 2017-02-28 MED ORDER — POLYSACCHARIDE IRON COMPLEX 150 MG PO CAPS
150.0000 mg | ORAL_CAPSULE | Freq: Every day | ORAL | Status: DC
  Filled 2017-02-28 (×2): qty 1

## 2017-02-28 MED ORDER — SERTRALINE HCL 50 MG PO TABS
50.0000 mg | ORAL_TABLET | Freq: Every day | ORAL | Status: DC
  Administered 2017-02-28 – 2017-03-01 (×2): 50 mg via ORAL
  Filled 2017-02-28 (×2): qty 1

## 2017-02-28 MED ORDER — POLYVINYL ALCOHOL 1.4 % OP SOLN
1.0000 [drp] | Freq: Two times a day (BID) | OPHTHALMIC | Status: DC
  Filled 2017-02-28 (×2): qty 15

## 2017-02-28 MED ORDER — LEVOTHYROXINE SODIUM 50 MCG PO TABS
75.0000 ug | ORAL_TABLET | Freq: Every day | ORAL | Status: DC
  Administered 2017-03-01: 08:00:00 75 ug via ORAL
  Filled 2017-02-28: qty 2

## 2017-02-28 MED ORDER — ACETAMINOPHEN 500 MG PO TABS
1000.0000 mg | ORAL_TABLET | Freq: Every day | ORAL | Status: DC | PRN
  Administered 2017-03-01: 1000 mg via ORAL
  Filled 2017-02-28: qty 2

## 2017-02-28 MED ORDER — ADULT MULTIVITAMIN W/MINERALS CH
1.0000 | ORAL_TABLET | Freq: Every day | ORAL | Status: DC
Start: 2017-02-28 — End: 2017-03-01
  Administered 2017-02-28 – 2017-03-01 (×2): 1 via ORAL
  Filled 2017-02-28 (×3): qty 1

## 2017-02-28 MED ORDER — METOPROLOL TARTRATE 50 MG PO TABS
50.0000 mg | ORAL_TABLET | Freq: Two times a day (BID) | ORAL | Status: DC
  Administered 2017-02-28 – 2017-03-01 (×2): 50 mg via ORAL
  Filled 2017-02-28 (×2): qty 1

## 2017-02-28 MED ORDER — VITAMIN D (ERGOCALCIFEROL) 1.25 MG (50000 UNIT) PO CAPS
50000.0000 [IU] | ORAL_CAPSULE | ORAL | Status: DC
  Administered 2017-03-01: 50000 [IU] via ORAL
  Filled 2017-02-28: qty 1

## 2017-02-28 MED ORDER — ALBUTEROL SULFATE (2.5 MG/3ML) 0.083% IN NEBU: 3.0000 mL | INHALATION_SOLUTION | Freq: Four times a day (QID) | RESPIRATORY_TRACT | Status: DC | PRN

## 2017-02-28 MED ORDER — TORSEMIDE 20 MG PO TABS
20.0000 mg | ORAL_TABLET | Freq: Every day | ORAL | Status: DC
  Administered 2017-02-28 – 2017-03-01 (×2): 20 mg via ORAL
  Filled 2017-02-28 (×2): qty 1

## 2017-02-28 MED ORDER — RENA-VITE PO TABS
1.0000 | ORAL_TABLET | Freq: Every day | ORAL | Status: DC
Start: 2017-02-28 — End: 2017-03-01
  Administered 2017-02-28 – 2017-03-01 (×2): 1 via ORAL
  Filled 2017-02-28 (×2): qty 1

## 2017-02-28 MED ORDER — LOSARTAN POTASSIUM 50 MG PO TABS: 100.0000 mg | ORAL_TABLET | Freq: Every day | ORAL | Status: DC | PRN

## 2017-02-28 NOTE — ED Triage Notes (Addendum)
Pt to ED via POV , was called by PCP for hg of 6. Pt currently receiving iron infusions at cancer center, recently had blood transfusion. Pt c/o fatigue and weakness. Denies any abd pain or dark stool.  VS stable

## 2017-02-28 NOTE — ED Provider Notes (Signed)
[facility] Emergency Department Provider Note   ____________________________________________    I have reviewed the triage vital signs and the nursing notes.   HISTORY  Chief Complaint Abnormal Lab     HPI Kathryn Short is a 83 y.o. female With a history of iron deficiency anemia who presents with worsening weakness and was called by her primary care physician and notified of the low hemoglobin. Reportedly blood was drawn on Monday and hemoglobin was found to be 6.9 by her PCP. This was decreased from prior. Patient has a history of chronic GI blood loss, she is on eliquis. Daughter reports the patient is far weaker than usual and unable to participate in her usual activities.no vomiting no abdominal pain   Past Medical History:  Diagnosis Date  . Atrial fibrillation ([place])   . Bowel obstruction ([place])   . CHF (congestive heart failure) ([place])   . Fall   . GERD (gastroesophageal reflux disease)   . Hypertension   . Hypothyroidism    Treated  . Iron deficiency anemia due to chronic blood loss 02/05/2017  . Iron deficiency anemia due to chronic blood loss 02/05/2017  . Melanoma ([place])    Resected from back  . OSA (obstructive sleep apnea)    On BiPAP  . Uterine cancer [facility])    s/p radiation therapy in 1952    Patient Active Problem List   Diagnosis Date Noted  . Symptomatic anemia 02/05/2017  . Iron deficiency anemia due to chronic blood loss 02/05/2017  . Vaginal mass   . Palliative care by specialist   . Goals of care, counseling/discussion   . Gastrointestinal bleeding 01/02/2017  . Acute posthemorrhagic anemia 01/02/2017  . Dyspnea 01/02/2017  . Swelling of lower extremity 01/02/2017  . Generalized weakness 01/02/2017  . Abdominal pain 01/02/2017  . Chronic diastolic CHF (congestive heart failure) ([place]) 10/19/2015  . Small bowel obstruction ([place]) 06/17/2014  . Atrial tachycardia ([place]) 03/11/2014  . Nocturia 12/31/2013  .  Chronotropic incompetence with sinus node dysfunction (HCC) 01/29/2013  . Falls frequently 10/29/2012  . Chest pain 02/28/2012  . Edema 01/29/2012  . WEAKNESS 11/08/2009  . Essential hypertension 01/22/2009  . Atrial fibrillation ([place]) 01/22/2009  . PACEMAKER, PERMANENT 01/22/2009    Past Surgical History:  Procedure Laterality Date  . CHOLECYSTECTOMY    . COLOSTOMY     For perforation  . Cytoscopy    . EP IMPLANTABLE DEVICE N/A 09/07/2015   Procedure:  PPM Generator Changeout;  Surgeon: Deboraha Sprang, MD;  Location: [place] [facility];  Service: Cardiovascular;  Laterality: N/A;  . INSERT / REPLACE / REMOVE PACEMAKER     Implantation-[employer]  . SKIN BIOPSY     face  . TOTAL ABDOMINAL HYSTERECTOMY W/ BILATERAL SALPINGOOPHORECTOMY      Prior to Admission medications   Medication Sig Start Date End Date Taking? Authorizing Provider  albuterol (PROVENTIL HFA;VENTOLIN HFA) 108 (90 BASE) MCG/ACT inhaler Inhale 2 puffs into the lungs every 6 (six) hours as needed for wheezing or shortness of breath.    Yes [provider]  amiodarone (PACERONE) 200 MG tablet TAKE ONE (1) TABLET BY MOUTH EVERY DAY 02/08/17  Yes Gollan, Kathlene November, MD  apixaban (ELIQUIS) 2.5 MG TABS tablet Take 1 tablet (2.5 mg) by mouth twice daily 01/08/17  Yes Deboraha Sprang, MD  b complex vitamins tablet Take 1 tablet by mouth daily.   Yes [provider]  Carboxymethylcellul-Glycerin (LUBRICATING EYE DROPS OP) Place  2 drops into both eyes 2 (two) times daily.   Yes [provider]  iron polysaccharides (NIFEREX) 150 MG capsule Take 150 mg by mouth daily.   Yes [provider]  lactulose (CHRONULAC) 10 GM/15ML solution Take 30 g by mouth daily.  05/31/14  Yes [provider]  levothyroxine (SYNTHROID, LEVOTHROID) 75 MCG tablet Take 75 mcg by mouth daily. 01/25/17 01/25/18 Yes [provider]  metoprolol (LOPRESSOR) 50 MG tablet Take 1 tablet (50 mg total) by mouth 2  (two) times daily. 07/30/16  Yes Gollan, Kathlene November, MD  montelukast (SINGULAIR) 10 MG tablet Take 10 mg by mouth at bedtime. 10/14/14  Yes [provider]  Multiple Vitamin (MULTIVITAMIN) tablet Take 1 tablet by mouth daily.   Yes [provider]  Multiple Vitamins-Minerals (PRESERVISION AREDS 2 PO) Take by mouth 2 (two) times daily.   Yes [provider]  potassium chloride (K-DUR,KLOR-CON) 10 MEQ tablet Take 1 tablet (10 mEq total) by mouth daily as needed. 03/28/15  Yes Minna Merritts, MD  sertraline (ZOLOFT) 50 MG tablet Take 50 mg by mouth daily.   Yes [provider]  spironolactone (ALDACTONE) 25 MG tablet Take 25 mg by mouth every other day.    Yes [provider]  torsemide (DEMADEX) 20 MG tablet Take 1 tablet (20 mg total) by mouth 2 (two) times daily as needed. 11/22/16  Yes Gollan, Kathlene November, MD  Vitamin D, Ergocalciferol, (DRISDOL) 50000 units CAPS capsule Take 50,000 Units by mouth every 7 (seven) days.   Yes [provider]  acetaminophen (TYLENOL) 500 MG tablet Take 1,000 mg by mouth daily as needed for mild pain or headache.     [provider]  clonazePAM (KLONOPIN) 0.5 MG tablet Take 0.5 mg by mouth 2 (two) times daily as needed for anxiety.    [provider]  cloNIDine (CATAPRES) 0.1 MG tablet Take 1 tablet (0.1 mg total) by mouth as needed. 07/30/16   Minna Merritts, MD  losartan (COZAAR) 100 MG tablet Take 1 tablet (100 mg total) by mouth daily as needed. 07/30/16   Minna Merritts, MD  nitroGLYCERIN (NITROSTAT) 0.4 MG SL tablet Place 1 tablet (0.4 mg total) under the tongue every 5 (five) minutes as needed for chest pain. 07/30/16   Minna Merritts, MD     Allergies Ace inhibitors; Ambien [zolpidem tartrate]; Bactrim [sulfamethoxazole-trimethoprim]; Digoxin and related; Hctz [hydrochlorothiazide]; and Lunesta [eszopiclone]  Family History  Problem Relation Age of Onset  . Family history unknown:  Yes    Social History Social History  Substance Use Topics  . Smoking status: Never Smoker  . Smokeless tobacco: Never Used     Comment: Tobacco use-no  . Alcohol use No    Review of Systems  Constitutional: No fever/chills Eyes: No visual changes.  ENT: No sore throat. Cardiovascular: Denies chest pain. Respiratory: Denies shortness of breath. Gastrointestinal: No abdominal pain.  Genitourinary: Negative for dysuria. Musculoskeletal: Negative for back pain. Skin: Negative for rash. Neurological: Negative for headaches   ____________________________________________   PHYSICAL EXAM:  VITAL SIGNS: ED Triage Vitals  Enc Vitals Group     BP 02/28/17 1121 (!) 120/40     Pulse Rate 02/28/17 1121 60     Resp 02/28/17 1121 14     Temp 02/28/17 1121 97.8 F (36.6 C)     Temp Source 02/28/17 1121 Oral     SpO2 02/28/17 1121 100 %     Weight 02/28/17 1121 64.4  kg (142 lb)     Height 02/28/17 1121 1.575 m (5\' 2" )     Head Circumference --      Peak Flow --      Pain Score 02/28/17 1120 0     Pain Loc --      Pain Edu? --      Excl. in [place]? --     Constitutional: Alert . No acute distress.heart appearing Eyes: Conjunctivae are pale Head: Atraumatic. Nose: No congestion/rhinnorhea. Mouth/Throat: Mucous membranes are moist.    Cardiovascular: Normal rate, regular rhythm. Grossly normal heart sounds.  Good peripheral circulation. Respiratory: Normal respiratory effort.  No retractions. Lungs CTAB. Gastrointestinal: Soft and nontender. No distention.  No CVA tenderness.guaiac positive brown stool Genitourinary: deferred Musculoskeletal: No lower extremity tenderness nor edema.  Warm and well perfused Neurologic:  Normal speech and language. No gross focal neurologic deficits are appreciated.  Skin:  Skin is warm, dry and intact. No rash noted. Psychiatric: Mood and affect are normal. Speech and behavior are normal.  ____________________________________________    LABS (all labs ordered are listed, but only abnormal results are displayed)  Labs Reviewed  COMPREHENSIVE METABOLIC PANEL - Abnormal; Notable for the following:       Result Value   Glucose, Bld 118 (*)    BUN 35 (*)    Creatinine, Ser 1.32 (*)    Calcium 8.3 (*)    Total Protein 6.2 (*)    Albumin 2.8 (*)    AST 72 (*)    GFR calc non Af Amer 34 (*)    GFR calc Af Amer 39 (*)    All other components within normal limits  CBC - Abnormal; Notable for the following:    RBC 2.76 (*)    Hemoglobin 7.6 (*)    HCT 23.6 (*)    RDW 25.1 (*)    All other components within normal limits  PROTIME-INR - Abnormal; Notable for the following:    Prothrombin Time 16.2 (*)    All other components within normal limits  POC OCCULT BLOOD, ED  TYPE AND SCREEN  PREPARE RBC (CROSSMATCH)   ____________________________________________  EKG  None ____________________________________________  RADIOLOGY  None ____________________________________________   PROCEDURES  Procedure(s) performed: No    Critical Care performed: yes  CRITICAL CARE Performed by: Lavonia Drafts   Total critical care time: 30 minutes  Critical care time was exclusive of separately billable procedures and treating other patients.  Critical care was necessary to treat or prevent imminent or life-threatening deterioration.  Critical care was time spent personally by me on the following activities: development of treatment plan with patient and/or surrogate as well as nursing, discussions with consultants, evaluation of patient's response to treatment, examination of patient, obtaining history from patient or surrogate, ordering and performing treatments and interventions, ordering and review of laboratory studies, ordering and review of radiographic studies, pulse oximetry and re-evaluation of patient's condition.  ____________________________________________   INITIAL IMPRESSION / ASSESSMENT AND PLAN / ED  COURSE  Pertinent labs & imaging results that were available during my care of the patient were reviewed by me and considered in my medical decision making (see chart for details).  Patient presents with decreased hemoglobin and weakness. On rectal exam guaiac positive brown stool.this has likely exacerbated her chronic iron deficiency and made her feel very weak. I will transfuse her 1 unit of PRBCs, patient has consented.  Medical record reviewed, history of admission related to GI bleeding in the past.  Discussed  diagnosis with daughter    ____________________________________________   FINAL CLINICAL IMPRESSION(S) / ED DIAGNOSES  Final diagnoses:  Acute GI bleeding  Symptomatic anemia      NEW MEDICATIONS STARTED DURING THIS VISIT:  New Prescriptions   No medications on file     Note:  This document was prepared using Dragon voice recognition software and may include unintentional dictation errors.    Lavonia Drafts, MD 02/28/17 1328

## 2017-02-28 NOTE — ED Notes (Signed)
IV team consult at bedside.

## 2017-02-28 NOTE — ED Notes (Signed)
Blood to be administered to pt on floor per Dr. Corky Downs EDP.

## 2017-02-28 NOTE — H&P (Addendum)
[facility] NAME: Kathryn Short    MR#:  [id number]  DATE OF BIRTH:  08/31/1921  DATE OF ADMISSION:  02/28/2017  PRIMARY CARE PHYSICIAN: Tracie Harrier, MD   REQUESTING/REFERRING PHYSICIAN: [email or web address]  CHIEF COMPLAINT:  Generalized weakness, abnormal labs  HISTORY OF PRESENT ILLNESS:  Kathryn Short  is a 83 y.o. female with a known history of Chronic atrial fibrillation, GERD, hypertension, congestive heart failure, hypothyroidism and chronic iron deficiency anemia sees Dr. you as an outpatient and getting iron supplements is presenting to the ED with a chief complaint of generalized weakness which is getting worse and abnormal labs hemoglobin at 7.6 a primary care physician. Stool for occult blood is positive in the ED. Patient is on eliquis for chronic atrial fibrillation  PAST MEDICAL HISTORY:   Past Medical History:  Diagnosis Date  . Atrial fibrillation ([place])   . Bowel obstruction ([place])   . CHF (congestive heart failure) ([place])   . Fall   . GERD (gastroesophageal reflux disease)   . Hypertension   . Hypothyroidism    Treated  . Iron deficiency anemia due to chronic blood loss 02/05/2017  . Iron deficiency anemia due to chronic blood loss 02/05/2017  . Melanoma ([place])    Resected from back  . OSA (obstructive sleep apnea)    On BiPAP  . Uterine cancer [facility])    s/p radiation therapy in 1952    PAST SURGICAL HISTOIRY:   Past Surgical History:  Procedure Laterality Date  . CHOLECYSTECTOMY    . COLOSTOMY     For perforation  . Cytoscopy    . EP IMPLANTABLE DEVICE N/A 09/07/2015   Procedure:  PPM Generator Changeout;  Surgeon: Deboraha Sprang, MD;  Location: [place] [facility];  Service: Cardiovascular;  Laterality: N/A;  . INSERT / REPLACE / REMOVE PACEMAKER     Implantation-[employer]  . SKIN BIOPSY     face  . TOTAL ABDOMINAL HYSTERECTOMY W/ BILATERAL SALPINGOOPHORECTOMY      SOCIAL HISTORY:   Social  History  Substance Use Topics  . Smoking status: Never Smoker  . Smokeless tobacco: Never Used     Comment: Tobacco use-no  . Alcohol use No    FAMILY HISTORY:   Family History  Problem Relation Age of Onset  . Family history unknown: Yes  Has history of essential hypertension running in her family  DRUG ALLERGIES:   Allergies  Allergen Reactions  . Ace Inhibitors Other (See Comments)    Unknown  . Ambien [Zolpidem Tartrate] Other (See Comments)    Dizzy  . Bactrim [Sulfamethoxazole-Trimethoprim] Other (See Comments)    Unknown  . Digoxin And Related Other (See Comments)    Didn't do well  . Hctz [Hydrochlorothiazide] Other (See Comments)    Unknown  . Lunesta [Eszopiclone] Other (See Comments)    Dizzy    REVIEW OF SYSTEMS:  CONSTITUTIONAL: No fever, Reporting worsening of generalized weakness.  EYES: No blurred or double vision.  EARS, NOSE, AND THROAT: No tinnitus or ear pain.  RESPIRATORY: No cough, shortness of breath, wheezing or hemoptysis.  CARDIOVASCULAR: No chest pain, orthopnea, edema.  GASTROINTESTINAL: No nausea, vomiting, diarrhea or abdominal pain.  GENITOURINARY: No dysuria, hematuria.  ENDOCRINE: No polyuria, nocturia,  HEMATOLOGY: Patient has chronic iron deficiency anemia, denies easy bruising or bleeding SKIN: No rash or lesion. MUSCULOSKELETAL: No joint pain or arthritis.   NEUROLOGIC: No tingling, numbness, weakness.  PSYCHIATRY: No anxiety or depression.  MEDICATIONS AT HOME:   Prior to Admission medications   Medication Sig Start Date End Date Taking? Authorizing Provider  albuterol (PROVENTIL HFA;VENTOLIN HFA) 108 (90 BASE) MCG/ACT inhaler Inhale 2 puffs into the lungs every 6 (six) hours as needed for wheezing or shortness of breath.    Yes [provider]  amiodarone (PACERONE) 200 MG tablet TAKE ONE (1) TABLET BY MOUTH EVERY DAY 02/08/17  Yes Gollan, Kathlene November, MD  apixaban (ELIQUIS) 2.5 MG TABS tablet Take 1 tablet (2.5 mg)  by mouth twice daily 01/08/17  Yes Deboraha Sprang, MD  b complex vitamins tablet Take 1 tablet by mouth daily.   Yes [provider]  Carboxymethylcellul-Glycerin (LUBRICATING EYE DROPS OP) Place 2 drops into both eyes 2 (two) times daily.   Yes [provider]  iron polysaccharides (NIFEREX) 150 MG capsule Take 150 mg by mouth daily.   Yes [provider]  lactulose (CHRONULAC) 10 GM/15ML solution Take 30 g by mouth daily.  05/31/14  Yes [provider]  levothyroxine (SYNTHROID, LEVOTHROID) 75 MCG tablet Take 75 mcg by mouth daily. 01/25/17 01/25/18 Yes [provider]  metoprolol (LOPRESSOR) 50 MG tablet Take 1 tablet (50 mg total) by mouth 2 (two) times daily. 07/30/16  Yes Gollan, Kathlene November, MD  montelukast (SINGULAIR) 10 MG tablet Take 10 mg by mouth at bedtime. 10/14/14  Yes [provider]  Multiple Vitamin (MULTIVITAMIN) tablet Take 1 tablet by mouth daily.   Yes [provider]  Multiple Vitamins-Minerals (PRESERVISION AREDS 2 PO) Take by mouth 2 (two) times daily.   Yes [provider]  potassium chloride (K-DUR,KLOR-CON) 10 MEQ tablet Take 1 tablet (10 mEq total) by mouth daily as needed. 03/28/15  Yes Minna Merritts, MD  sertraline (ZOLOFT) 50 MG tablet Take 50 mg by mouth daily.   Yes [provider]  spironolactone (ALDACTONE) 25 MG tablet Take 25 mg by mouth every other day.    Yes [provider]  torsemide (DEMADEX) 20 MG tablet Take 1 tablet (20 mg total) by mouth 2 (two) times daily as needed. 11/22/16  Yes Gollan, Kathlene November, MD  Vitamin D, Ergocalciferol, (DRISDOL) 50000 units CAPS capsule Take 50,000 Units by mouth every 7 (seven) days.   Yes [provider]  acetaminophen (TYLENOL) 500 MG tablet Take 1,000 mg by mouth daily as needed for mild pain or headache.     [provider]  clonazePAM (KLONOPIN) 0.5 MG tablet Take 0.5 mg by mouth 2 (two) times daily as needed for  anxiety.    [provider]  cloNIDine (CATAPRES) 0.1 MG tablet Take 1 tablet (0.1 mg total) by mouth as needed. 07/30/16   Minna Merritts, MD  losartan (COZAAR) 100 MG tablet Take 1 tablet (100 mg total) by mouth daily as needed. 07/30/16   Minna Merritts, MD  nitroGLYCERIN (NITROSTAT) 0.4 MG SL tablet Place 1 tablet (0.4 mg total) under the tongue every 5 (five) minutes as needed for chest pain. 07/30/16   Minna Merritts, MD      VITAL SIGNS:  Blood pressure (!) 116/54, pulse (!) 59, temperature 97.8 F (36.6 C), temperature source Oral, resp. rate 19, height 5\' 2"  (1.575 m), weight 64.4 kg (142 lb), SpO2 98 %.  PHYSICAL EXAMINATION:  GENERAL:  83 y.o.-year-old patient lying in the bed with no acute distress.Reporting worsening of generalized weakness  EYES: Pupils equal, round, reactive to light and accommodation. No scleral icterus. Extraocular muscles intact.  HEENT: Head atraumatic, normocephalic. Oropharynx and nasopharynx clear.  NECK:  Supple, no jugular venous distention. No thyroid enlargement, no tenderness.  LUNGS: Normal breath sounds bilaterally, no wheezing, rales,rhonchi or crepitation. No use of accessory muscles of respiration.  CARDIOVASCULAR: S1, S2 normal. No murmurs, rubs, or gallops.  ABDOMEN: Soft, nontender, nondistended. Bowel sounds present. No organomegaly or mass.  EXTREMITIES: No pedal edema, cyanosis, or clubbing.  NEUROLOGIC: Cranial nerves II through XII are intact. Muscle strength at her baseline in all extremities. Sensation intact. Gait not checked.  PSYCHIATRIC: The patient is alert and oriented x 3.  SKIN: No obvious rash, lesion, or ulcer.   LABORATORY PANEL:   CBC  Recent Labs Lab 02/28/17 1120  WBC 10.6  HGB 7.6*  HCT 23.6*  PLT 251   ------------------------------------------------------------------------------------------------------------------  Chemistries   Recent Labs Lab 02/28/17 1120  NA 136  K 4.1  CL 101   CO2 27  GLUCOSE 118*  BUN 35*  CREATININE 1.32*  CALCIUM 8.3*  AST 72*  ALT 52  ALKPHOS 119  BILITOT 0.5   ------------------------------------------------------------------------------------------------------------------  Cardiac Enzymes No results for input(s): TROPONINI in the last 168 hours. ------------------------------------------------------------------------------------------------------------------  RADIOLOGY:  No results found.  EKG:   Orders placed or performed in visit on 01/08/17  . EKG 12-Lead    IMPRESSION AND PLAN:   Kathryn Short  is a 83 y.o. female with a known history of Chronic atrial fibrillation, GERD, hypertension, congestive heart failure, hypothyroidism and chronic iron deficiency anemia sees Dr. you as an outpatient and getting iron supplements is presenting to the ED with a chief complaint of generalized weakness which is getting worse and abnormal labs hemoglobin at 7.6 acc to  primary care physician  # Symptomatic anemia- stool for occult blood is positive/has history of hemorrhoids Type and crossmatch and transfuse 1 unit of blood Monitor hemoglobin and hematocrit and transfuse as needed Holding Eliquis GI consult if needed  # Acute on chronic iron deficiency anemia Patient gets IV iron transfusion at [place] and sees Dr. Tasia Catchings as an outpatient Continue iron supplements Consult oncology  #Chronic atrial fibrillation rate controlled Continue metoprolol for rate control and hold eliquis in view of the abdomen anemia Consult cardiology Dr. Rockey Situ for further recommendations regarding continuation of anticoagulation versus discontinuing  #Essential hypertension We will hold off on clonidine and torsemide as blood pressure is soft Continue metoprolol with holding parameters  #Chronic history of vaginal mass Outpatient follow-up with oncology and gynecology    All the records are reviewed and case discussed with ED  provider. Management plans discussed with the patient, family and they are in agreement.  CODE STATUS: FC, daughter HCPOA   TOTAL TIME TAKING CARE OF THIS PATIENT: 45  minutes.   Note: This dictation was prepared with Dragon dictation along with smaller phrase technology. Any transcriptional errors that result from this process are unintentional.  Nicholes Mango M.D on 02/28/2017 at 2:41 PM  Between 7am to 6pm - Pager - [phone]  After 6pm go to [email or web address] - password [email or web address] [facility]  Office  [phone]  CC: Primary care physician; Tracie Harrier, MD

## 2017-02-28 NOTE — Telephone Encounter (Signed)
Daughter received a call from patient PCP that her hgb is 6.9 and that she needs a blood transfusion.   Our infusion suite cannot accommodate a transfusion today.   I spoke with Dr Tasia Catchings and she suggests that patient go to the ER as her hgb was just 7.9 2 weeks ago and that she is losing blood again most likely from GI tract.   I called Ms Clint Guy back and she agrees to take patient to ER

## 2017-03-01 ENCOUNTER — Inpatient Hospital Stay: Payer: Medicare HMO

## 2017-03-01 DIAGNOSIS — I129 Hypertensive chronic kidney disease with stage 1 through stage 4 chronic kidney disease, or unspecified chronic kidney disease: Secondary | ICD-10-CM

## 2017-03-01 DIAGNOSIS — D500 Iron deficiency anemia secondary to blood loss (chronic): Secondary | ICD-10-CM

## 2017-03-01 DIAGNOSIS — G4730 Sleep apnea, unspecified: Secondary | ICD-10-CM

## 2017-03-01 DIAGNOSIS — D509 Iron deficiency anemia, unspecified: Secondary | ICD-10-CM | POA: Diagnosis not present

## 2017-03-01 DIAGNOSIS — R0602 Shortness of breath: Secondary | ICD-10-CM

## 2017-03-01 DIAGNOSIS — I4891 Unspecified atrial fibrillation: Secondary | ICD-10-CM

## 2017-03-01 DIAGNOSIS — R5383 Other fatigue: Secondary | ICD-10-CM

## 2017-03-01 DIAGNOSIS — M7989 Other specified soft tissue disorders: Secondary | ICD-10-CM | POA: Diagnosis not present

## 2017-03-01 DIAGNOSIS — Z8542 Personal history of malignant neoplasm of other parts of uterus: Secondary | ICD-10-CM

## 2017-03-01 DIAGNOSIS — N899 Noninflammatory disorder of vagina, unspecified: Secondary | ICD-10-CM | POA: Diagnosis not present

## 2017-03-01 DIAGNOSIS — D649 Anemia, unspecified: Secondary | ICD-10-CM | POA: Diagnosis not present

## 2017-03-01 DIAGNOSIS — Z7901 Long term (current) use of anticoagulants: Secondary | ICD-10-CM

## 2017-03-01 DIAGNOSIS — K921 Melena: Secondary | ICD-10-CM

## 2017-03-01 DIAGNOSIS — Z9181 History of falling: Secondary | ICD-10-CM

## 2017-03-01 DIAGNOSIS — R609 Edema, unspecified: Secondary | ICD-10-CM

## 2017-03-01 DIAGNOSIS — I5032 Chronic diastolic (congestive) heart failure: Secondary | ICD-10-CM | POA: Diagnosis not present

## 2017-03-01 DIAGNOSIS — I10 Essential (primary) hypertension: Secondary | ICD-10-CM | POA: Diagnosis not present

## 2017-03-01 DIAGNOSIS — N189 Chronic kidney disease, unspecified: Secondary | ICD-10-CM | POA: Diagnosis not present

## 2017-03-01 DIAGNOSIS — Z79899 Other long term (current) drug therapy: Secondary | ICD-10-CM

## 2017-03-01 DIAGNOSIS — R1900 Intra-abdominal and pelvic swelling, mass and lump, unspecified site: Secondary | ICD-10-CM | POA: Diagnosis not present

## 2017-03-01 DIAGNOSIS — R531 Weakness: Secondary | ICD-10-CM

## 2017-03-01 DIAGNOSIS — I482 Chronic atrial fibrillation: Secondary | ICD-10-CM | POA: Diagnosis not present

## 2017-03-01 LAB — COMPREHENSIVE METABOLIC PANEL
ALT: 49 U/L (ref 14–54)
AST: 76 U/L — ABNORMAL HIGH (ref 15–41)
Albumin: 2.6 g/dL — ABNORMAL LOW (ref 3.5–5.0)
Alkaline Phosphatase: 120 U/L (ref 38–126)
Anion gap: 9 (ref 5–15)
BUN: 29 mg/dL — ABNORMAL HIGH (ref 6–20)
CHLORIDE: 102 mmol/L (ref 101–111)
CO2: 27 mmol/L (ref 22–32)
CREATININE: 1.26 mg/dL — AB (ref 0.44–1.00)
Calcium: 8.1 mg/dL — ABNORMAL LOW (ref 8.9–10.3)
GFR, EST AFRICAN AMERICAN: 41 mL/min — AB (ref >60)
GFR, EST NON AFRICAN AMERICAN: 36 mL/min — AB (ref >60)
Glucose, Bld: 106 mg/dL — ABNORMAL HIGH (ref 65–99)
Potassium: 3.5 mmol/L (ref 3.5–5.1)
Sodium: 138 mmol/L (ref 135–145)
Total Bilirubin: 0.8 mg/dL (ref 0.3–1.2)
Total Protein: 5.8 g/dL — ABNORMAL LOW (ref 6.5–8.1)

## 2017-03-01 LAB — BPAM RBC
BLOOD PRODUCT EXPIRATION DATE: 201809302359
ISSUE DATE / TIME: 201809201614
UNIT TYPE AND RH: 9500

## 2017-03-01 LAB — TYPE AND SCREEN
ABO/RH(D): O NEG
ANTIBODY SCREEN: NEGATIVE
UNIT DIVISION: 0

## 2017-03-01 LAB — CBC
HCT: 25.7 % — ABNORMAL LOW (ref 35.0–47.0)
Hemoglobin: 8.8 g/dL — ABNORMAL LOW (ref 12.0–16.0)
MCH: 29 pg (ref 26.0–34.0)
MCHC: 34.1 g/dL (ref 32.0–36.0)
MCV: 84.8 fL (ref 80.0–100.0)
PLATELETS: 214 10*3/uL (ref 150–440)
RBC: 3.03 MIL/uL — ABNORMAL LOW (ref 3.80–5.20)
RDW: 21.3 % — AB (ref 11.5–14.5)
WBC: 12.1 10*3/uL — ABNORMAL HIGH (ref 3.6–11.0)

## 2017-03-01 MED ORDER — IRON SUCROSE 20 MG/ML IV SOLN
200.0000 mg | Freq: Once | INTRAVENOUS | Status: AC
  Administered 2017-03-01: 12:00:00 200 mg via INTRAVENOUS
  Filled 2017-03-01: qty 10

## 2017-03-01 MED ORDER — ASPIRIN EC 325 MG PO TBEC: 325.0000 mg | DELAYED_RELEASE_TABLET | Freq: Every day | ORAL | 3 refills | Status: DC

## 2017-03-01 MED ORDER — LACTULOSE 10 GM/15ML PO SOLN: 30.0000 g | ORAL | 0 refills | Status: AC

## 2017-03-01 NOTE — Care Management (Signed)
Placed in observation for anemia.  Has received transfusions.  Lives with her daughter.  Has a rollator.  No issues gong to the doctor or paying for meds. Currently receiving services from [facility].

## 2017-03-01 NOTE — Care Management Obs Status (Signed)
[place] NOTIFICATION   Patient Details  Name: Kathryn Short MRN: [id number] Date of Birth: March 19, 1922   Medicare Observation Status Notification Given:  Yes Notice signed, one given to patient and the other to HIM for scanning    Katrina Stack, RN 03/01/2017, 10:33 AM

## 2017-03-01 NOTE — Progress Notes (Signed)
Pt for discharge home.  Alert / responsive  No resp distress. Discharge instructions discussed with pts dtr. Diet/ activity / f/u and meds discussed.  dtr verbalizes understanding of plans. Ready for discharge home

## 2017-03-01 NOTE — Discharge Instructions (Signed)
[facility] - [place] at [facility] ° °DIET:  °Cardiac diet ° °DISCHARGE CONDITION:  °Stable ° °ACTIVITY:  °Activity as tolerated ° °OXYGEN:  °Home Oxygen: No. °  °Oxygen Delivery: room air ° °DISCHARGE LOCATION:  °home  ° ° °ADDITIONAL DISCHARGE INSTRUCTION: ° ° °If you experience worsening of your admission symptoms, develop shortness of breath, life threatening emergency, suicidal or homicidal thoughts you must seek medical attention immediately by calling 911 or calling your MD immediately  if symptoms less severe. ° °You Must read complete instructions/literature along with all the possible adverse reactions/side effects for all the Medicines you take and that have been prescribed to you. Take any new Medicines after you have completely understood and accpet all the possible adverse reactions/side effects.  ° °Please note ° °You were cared for by a hospitalist during your hospital stay. If you have any questions about your discharge medications or the care you received while you were in the hospital after you are discharged, you can call the unit and asked to speak with the hospitalist on call if the hospitalist that took care of you is not available. Once you are discharged, your primary care physician will handle any further medical issues. Please note that NO REFILLS for any discharge medications will be authorized once you are discharged, as it is imperative that you return to your primary care physician (or establish a relationship with a primary care physician if you do not have one) for your aftercare needs so that they can reassess your need for medications and monitor your lab values. ° ° °

## 2017-03-01 NOTE — Consult Note (Signed)
Hematology/Oncology Consult note [facility] [phone] [phone]  Patient Care Team: Tracie Harrier, MD as PCP - General (Internal Medicine) Minna Merritts, MD as Consulting Physician (Cardiology)   Name of the patient: Kathryn Short  [id number]  May 18, 1922   History of presenting illness-  Patient is a very pleasant 83 yo female with PMH listed as below who is known to me for iron deficiency anemia due to chronic blood loss. She was admitted in July 2018 due to acute on chronic anemia, fatigue. Hemoccult was positive. She was on Eliquis for A-Fib and this was held. She received blood transfusion inpatient and later after she was discharged, she received IV iron outpatient. She was evaluated by cardiology and Elquis was restarted at 2,5mg  BID due to high risk A-Fib.  She went to follow up with primary care physician and had repeat lab tests. Hemoglobin was 6.9 and patient feels very weak. Patient was sent to ER for symptomatic anemia.    patient has a remote history of uterine cancer in 1952, treated with RT. She was recently evaluated by GYN during her last admission and CT findings of soft tissue mass were consider to be stable and less likely new recurrence.     Review of systems- Review of Systems  Constitutional: Positive for malaise/fatigue.  HENT: Negative.   Eyes: Negative.   Respiratory: Positive for shortness of breath.   Cardiovascular: Negative.   Gastrointestinal: Positive for blood in stool.  Genitourinary: Negative.   Musculoskeletal: Negative.   Skin: Negative.   Neurological: Positive for weakness.  Endo/Heme/Allergies: Negative.   Psychiatric/Behavioral: Negative.     Allergies  Allergen Reactions  . Ace Inhibitors Other (See Comments)    Unknown  . Ambien [Zolpidem Tartrate] Other (See Comments)    Dizzy  . Bactrim [Sulfamethoxazole-Trimethoprim] Other (See Comments)    Unknown  . Digoxin And Related  Other (See Comments)    Didn't do well  . Hctz [Hydrochlorothiazide] Other (See Comments)    Unknown  . Lunesta [Eszopiclone] Other (See Comments)    Dizzy    Patient Active Problem List   Diagnosis Date Noted  . Symptomatic anemia 02/05/2017  . Iron deficiency anemia due to chronic blood loss 02/05/2017  . Vaginal mass   . Palliative care by specialist   . Goals of care, counseling/discussion   . Gastrointestinal bleeding 01/02/2017  . Acute posthemorrhagic anemia 01/02/2017  . Dyspnea 01/02/2017  . Swelling of lower extremity 01/02/2017  . Generalized weakness 01/02/2017  . Abdominal pain 01/02/2017  . Chronic diastolic CHF (congestive heart failure) ([place]) 10/19/2015  . Small bowel obstruction ([place]) 06/17/2014  . Atrial tachycardia ([place]) 03/11/2014  . Nocturia 12/31/2013  . Chronotropic incompetence with sinus node dysfunction ([facility]) 01/29/2013  . Falls frequently 10/29/2012  . Chest pain 02/28/2012  . Edema 01/29/2012  . WEAKNESS 11/08/2009  . Essential hypertension 01/22/2009  . Atrial fibrillation ([place]) 01/22/2009  . PACEMAKER, PERMANENT 01/22/2009     Past Medical History:  Diagnosis Date  . Atrial fibrillation ([place])   . Bowel obstruction ([place])   . CHF (congestive heart failure) ([place])   . Fall   . GERD (gastroesophageal reflux disease)   . Hypertension   . Hypothyroidism    Treated  . Iron deficiency anemia due to chronic blood loss 02/05/2017  . Iron deficiency anemia due to chronic blood loss 02/05/2017  . Melanoma ([place])    Resected from back  . OSA (obstructive sleep apnea)  On BiPAP  . Uterine cancer [facility])    s/p radiation therapy in 1952     Past Surgical History:  Procedure Laterality Date  . CHOLECYSTECTOMY    . COLOSTOMY     For perforation  . Cytoscopy    . EP IMPLANTABLE DEVICE N/A 09/07/2015   Procedure:  PPM Generator Changeout;  Surgeon: Deboraha Sprang, MD;  Location: [place] [facility];  Service: Cardiovascular;  Laterality: N/A;  .  INSERT / REPLACE / REMOVE PACEMAKER     Implantation-[employer]  . SKIN BIOPSY     face  . TOTAL ABDOMINAL HYSTERECTOMY W/ BILATERAL SALPINGOOPHORECTOMY      Social History   Social History  . Marital status: Widowed    Spouse name: N/A  . Number of children: 5  . Years of education: N/A   Occupational History  .  Retired   Social History Main Topics  . Smoking status: Never Smoker  . Smokeless tobacco: Never Used     Comment: Tobacco use-no  . Alcohol use No  . Drug use: No  . Sexual activity: Not on file   Other Topics Concern  . Not on file   Social History Narrative  . No narrative on file     Family History  Problem Relation Age of Onset  . Family history unknown: Yes     Current Facility-Administered Medications:  .  acetaminophen (TYLENOL) tablet 1,000 mg, 1,000 mg, Oral, Daily PRN, Gouru, Aruna, MD, 1,000 mg at 03/01/17 0104 .  albuterol (PROVENTIL) (2.5 MG/3ML) 0.083% nebulizer solution 3 mL, 3 mL, Inhalation, Q6H PRN, Gouru, Aruna, MD .  amiodarone (PACERONE) tablet 200 mg, 200 mg, Oral, Daily, Gouru, Aruna, MD, 200 mg at 03/01/17 1100 .  clonazePAM (KLONOPIN) tablet 0.5 mg, 0.5 mg, Oral, BID PRN, Gouru, Aruna, MD, 0.5 mg at 03/01/17 0112 .  iron polysaccharides (NIFEREX) capsule 150 mg, 150 mg, Oral, Daily, Gouru, Aruna, MD .  lactulose (CHRONULAC) 10 GM/15ML solution 30 g, 30 g, Oral, Daily, Gouru, Aruna, MD, 30 g at 02/28/17 1604 .  levothyroxine (SYNTHROID, LEVOTHROID) tablet 75 mcg, 75 mcg, Oral, QAC breakfast, Gouru, Aruna, MD, 75 mcg at 03/01/17 0817 .  losartan (COZAAR) tablet 100 mg, 100 mg, Oral, Daily PRN, Gouru, Aruna, MD .  metoprolol tartrate (LOPRESSOR) tablet 50 mg, 50 mg, Oral, BID, Gouru, Aruna, MD, 50 mg at 03/01/17 1100 .  montelukast (SINGULAIR) tablet 10 mg, 10 mg, Oral, QHS, Gouru, Aruna, MD, 10 mg at 02/28/17 2317 .  multivitamin (RENA-VIT) tablet 1 tablet, 1 tablet, Oral, Daily, Gouru, Aruna, MD, 1 tablet at 03/01/17 1100 .   multivitamin with minerals tablet 1 tablet, 1 tablet, Oral, Daily, Gouru, Aruna, MD, 1 tablet at 03/01/17 1100 .  multivitamin-lutein (OCUVITE-LUTEIN) capsule 1 capsule, 1 capsule, Oral, BID, Gouru, Aruna, MD, 1 capsule at 03/01/17 1100 .  nitroGLYCERIN (NITROSTAT) SL tablet 0.4 mg, 0.4 mg, Sublingual, Q5 min PRN, Gouru, Aruna, MD .  ondansetron (ZOFRAN) tablet 4 mg, 4 mg, Oral, Q6H PRN **OR** ondansetron (ZOFRAN) injection 4 mg, 4 mg, Intravenous, Q6H PRN, Gouru, Aruna, MD .  polyvinyl alcohol (LIQUIFILM TEARS) 1.4 % ophthalmic solution 1 drop, 1 drop, Both Eyes, BID, Gouru, Aruna, MD .  sertraline (ZOLOFT) tablet 50 mg, 50 mg, Oral, Daily, Gouru, Aruna, MD, 50 mg at 03/01/17 1100 .  spironolactone (ALDACTONE) tablet 25 mg, 25 mg, Oral, QODAY, Gouru, Aruna, MD, 25 mg at 03/01/17 1100 .  torsemide (DEMADEX) tablet 20 mg, 20 mg, Oral, Daily, Gouru,  Illene Silver, MD, 20 mg at 03/01/17 1100 .  Vitamin D (Ergocalciferol) (DRISDOL) capsule 50,000 Units, 50,000 Units, Oral, Q7 days, Gouru, Aruna, MD, 50,000 Units at 03/01/17 1100   Physical exam:  Vitals:   02/28/17 2010 02/28/17 2012 03/01/17 0507 03/01/17 0806  BP: (!) 124/34 (!) 138/43 (!) 113/56 (!) 132/43  Pulse: 63 63 60 (!) 58  Resp: (!) 24  20 18   Temp: 97.9 F (36.6 C)  98.4 F (36.9 C) 97.6 F (36.4 C)  TempSrc: Oral   Oral  SpO2: 98%  98% 98%  Weight:      Height:       Physical Exam     CMP Latest Ref Rng & Units 03/01/2017  Glucose 65 - 99 mg/dL 106(H)  BUN 6 - 20 mg/dL 29(H)  Creatinine 0.44 - 1.00 mg/dL 1.26(H)  Sodium 135 - 145 mmol/L 138  Potassium 3.5 - 5.1 mmol/L 3.5  Chloride 101 - 111 mmol/L 102  CO2 22 - 32 mmol/L 27  Calcium 8.9 - 10.3 mg/dL 8.1(L)  Total Protein 6.5 - 8.1 g/dL 5.8(L)  Total Bilirubin 0.3 - 1.2 mg/dL 0.8  Alkaline Phos 38 - 126 U/L 120  AST 15 - 41 U/L 76(H)  ALT 14 - 54 U/L 49   CBC Latest Ref Rng & Units 03/01/2017  WBC 3.6 - 11.0 K/uL 12.1(H)  Hemoglobin 12.0 - 16.0 g/dL 8.8(L)  Hematocrit  35.0 - 47.0 % 25.7(L)  Platelets 150 - 440 K/uL 214   Assessment and plan-  Patient is a 83 y.o. female with history of iron deficiency anemia due to chronic blood loss admitted for symptomatic anemia treatment.   #  Symptomatic anemia- stool for occult blood is positive. Awaiting GI recommendation.  S/p  1 unit of blood.Hemoglobin improved.  Agree with inpatient IV iron with venofer.  She can follow up with me outpatient and I will repeat iron panel to decide if she needs additional IV iron.  # Chronic GI blood loss; Appreciate GI recommendation on patient's ongoing GI blood loss.  # CKD: after her Iron store is fully replete, if still can not keep Hb above 10, will consider procrit.  # Fatigue related to anemia.   Total face to face encounter time for this patient visit was 50 min. >50% of the time was  spent in counseling and coordination of care.  Dr. Earlie Server, MD, PhD [facility] at [facility] Pager- [phone] 03/01/2017

## 2017-03-01 NOTE — Discharge Summary (Signed)
Kathryn Short at [facility], 83 y.o., DOB [birth year], MRN [id number]. Admission date: 02/28/2017 Discharge Date 03/01/2017 Primary MD Tracie Harrier, MD Admitting Physician Nicholes Mango, MD  Admission Diagnosis  Acute GI bleeding [K92.2] Symptomatic anemia [D64.9]  Discharge Diagnosis   Active Problems:   Symptomatic acute on chronic iron deficiency anemia  Chronic atrial fibrillation  Essential hypertension  History of vaginal mass  Hypothyroidism Essential hypertension GERD History of CHF type unknown        Hospital Course  Kathryn Short  is a 83 y.o. female with a known history of Chronic atrial fibrillation, GERD, hypertension, congestive heart failure, hypothyroidism and chronic iron deficiency anemia sees Dr. you as an outpatient and getting iron supplements is presenting to the ED with a chief complaint of generalized weakness which is getting worse and abnormal labs hemoglobin at 7.6 a primary care physician. Patient has required admission for similar thing in the past. She is on chronic anticoagulation with eliquis  4 atrial fibrillation. Patient was admitted and given 1 unit of blood with improvement in her blood count. She also had positive guaiac. I discussed with the patient daughter she did not want her to have a GI evaluation she did not want her to have endoscopy or colonoscopy. Patient has been receiving IV iron as outpatient so she will finish her dose of IV iron here today in the hospital. Patient notes chronic anticoagulation with L requests has been discontinued due to severe anemia and recurrent. Patient will need to discuss with her primary cardiologist to make sure that this would be appropriate. In the meantime she'll be on aspirin. Daughter is agreeable to this plan.          Consults  None  Significant Tests:  See full reports for all details    No results found.     Today   Subjective:   Kathryn Short patient feels well little sleepy but other than that no complaints  Objective:   Blood pressure (!) 132/43, pulse (!) 58, temperature 97.6 F (36.4 C), temperature source Oral, resp. rate 18, height 5\' 2"  (1.575 m), weight 142 lb (64.4 kg), SpO2 98 %.  .  Intake/Output Summary (Last 24 hours) at 03/01/17 1322 Last data filed at 02/28/17 1953  Gross per 24 hour  Intake              481 ml  Output                0 ml  Net              481 ml    Exam VITAL SIGNS: Blood pressure (!) 132/43, pulse (!) 58, temperature 97.6 F (36.4 C), temperature source Oral, resp. rate 18, height 5\' 2"  (1.575 m), weight 142 lb (64.4 kg), SpO2 98 %.  GENERAL:  83 y.o.-year-old patient lying in the bed with no acute distress.  EYES: Pupils equal, round, reactive to light and accommodation. No scleral icterus. Extraocular muscles intact.  HEENT: Head atraumatic, normocephalic. Oropharynx and nasopharynx clear.  NECK:  Supple, no jugular venous distention. No thyroid enlargement, no tenderness.  LUNGS: Normal breath sounds bilaterally, no wheezing, rales,rhonchi or crepitation. No use of accessory muscles of respiration.  CARDIOVASCULAR: S1, S2 normal. No murmurs, rubs, or gallops.  ABDOMEN: Soft, nontender, nondistended. Bowel sounds present. No organomegaly or mass.  EXTREMITIES: No pedal edema, cyanosis, or clubbing.  NEUROLOGIC: Cranial nerves II through XII are intact. Muscle  strength 5/5 in all extremities. Sensation intact. Gait not checked.  PSYCHIATRIC: The patient is alert and oriented x 3.  SKIN: No obvious rash, lesion, or ulcer.   Data Review     CBC w Diff: Lab Results  Component Value Date   WBC 12.1 (H) 03/01/2017   HGB 8.8 (L) 03/01/2017   HGB 11.1 08/30/2015   HCT 25.7 (L) 03/01/2017   HCT 33.1 (L) 08/30/2015   PLT 214 03/01/2017   PLT 252 08/30/2015   LYMPHOPCT 13 02/05/2017   LYMPHOPCT 24.1 05/28/2014   MONOPCT 6 02/05/2017   MONOPCT 8.7 05/28/2014   EOSPCT 1  02/05/2017   EOSPCT 3.1 05/28/2014   BASOPCT 1 02/05/2017   BASOPCT 1.0 05/28/2014   CMP: Lab Results  Component Value Date   NA 138 03/01/2017   NA 129 (L) 08/30/2015   NA 133 (L) 05/28/2014   K 3.5 03/01/2017   K 3.5 05/28/2014   CL 102 03/01/2017   CL 99 05/28/2014   CO2 27 03/01/2017   CO2 24 05/28/2014   BUN 29 (H) 03/01/2017   BUN 18 08/30/2015   BUN 6 (L) 05/28/2014   CREATININE 1.26 (H) 03/01/2017   CREATININE 0.89 05/28/2014   PROT 5.8 (L) 03/01/2017   PROT 6.9 02/27/2012   ALBUMIN 2.6 (L) 03/01/2017   ALBUMIN 3.7 02/27/2012   BILITOT 0.8 03/01/2017   BILITOT 0.3 02/27/2012   ALKPHOS 120 03/01/2017   ALKPHOS 83 02/27/2012   AST 76 (H) 03/01/2017   AST 48 (H) 02/27/2012   ALT 49 03/01/2017   ALT 78 02/27/2012  .  Micro Results No results found for this or any previous visit (from the past 240 hour(s)).      Code Status Orders        Start     Ordered   02/28/17 1447  Full code  Continuous     02/28/17 1446    Code Status History    Date Active Date Inactive Code Status Order ID Comments User Context   01/02/2017  6:38 PM 01/04/2017  5:35 PM Full Code [id number]  Theodoro Grist, MD Inpatient    Advance Directive Documentation     Most Recent Value  Type of Advance Directive  Healthcare Power of Attorney  Pre-existing out of facility DNR order (yellow form or pink MOST form)  -  "MOST" Form in Place?  -          Follow-up Information    Tracie Harrier, MD On 03/05/2017.   Specialty:  Internal Medicine Why:  @11 :15 AM Contact information: [place] [address] [phone]        Minna Merritts, MD On 03/07/2017.   Specialty:  Cardiology Why:  hopital f/u , recuurent anemia, discuss regarding eliquis. @2 :40 PM Contact information: [address] [place] [address] [phone]           Discharge Medications   Allergies as of 03/01/2017      Reactions   Ace  Inhibitors Other (See Comments)   Unknown   Ambien [zolpidem Tartrate] Other (See Comments)   Dizzy   Bactrim [sulfamethoxazole-trimethoprim] Other (See Comments)   Unknown   Digoxin And Related Other (See Comments)   Didn't do well   Hctz [hydrochlorothiazide] Other (See Comments)   Unknown   Lunesta [eszopiclone] Other (See Comments)   Dizzy      Medication List    STOP taking these medications  apixaban 2.5 MG Tabs tablet Commonly known as:  ELIQUIS   iron polysaccharides 150 MG capsule Commonly known as:  NIFEREX     TAKE these medications   acetaminophen 500 MG tablet Commonly known as:  TYLENOL Take 1,000 mg by mouth daily as needed for mild pain or headache.   albuterol 108 (90 Base) MCG/ACT inhaler Commonly known as:  PROVENTIL HFA;VENTOLIN HFA Inhale 2 puffs into the lungs every 6 (six) hours as needed for wheezing or shortness of breath.   amiodarone 200 MG tablet Commonly known as:  PACERONE TAKE ONE (1) TABLET BY MOUTH EVERY DAY   aspirin EC 325 MG tablet Take 1 tablet (325 mg total) by mouth daily.   b complex vitamins tablet Take 1 tablet by mouth daily.   clonazePAM 0.5 MG tablet Commonly known as:  KLONOPIN Take 0.5 mg by mouth 2 (two) times daily as needed for anxiety.   cloNIDine 0.1 MG tablet Commonly known as:  CATAPRES Take 1 tablet (0.1 mg total) by mouth as needed.   lactulose 10 GM/15ML solution Commonly known as:  CHRONULAC Take 45 mLs (30 g total) by mouth every other day. What changed:  when to take this   levothyroxine 75 MCG tablet Commonly known as:  SYNTHROID, LEVOTHROID Take 75 mcg by mouth daily.   losartan 100 MG tablet Commonly known as:  COZAAR Take 1 tablet (100 mg total) by mouth daily as needed.   LUBRICATING EYE DROPS OP Place 2 drops into both eyes 2 (two) times daily.   metoprolol tartrate 50 MG tablet Commonly known as:  LOPRESSOR Take 1 tablet (50 mg total) by mouth 2 (two) times daily.   montelukast  10 MG tablet Commonly known as:  SINGULAIR Take 10 mg by mouth at bedtime.   multivitamin tablet Take 1 tablet by mouth daily.   nitroGLYCERIN 0.4 MG SL tablet Commonly known as:  NITROSTAT Place 1 tablet (0.4 mg total) under the tongue every 5 (five) minutes as needed for chest pain.   potassium chloride 10 MEQ tablet Commonly known as:  K-DUR,KLOR-CON Take 1 tablet (10 mEq total) by mouth daily as needed.   PRESERVISION AREDS 2 PO Take by mouth 2 (two) times daily.   sertraline 50 MG tablet Commonly known as:  ZOLOFT Take 50 mg by mouth daily.   spironolactone 25 MG tablet Commonly known as:  ALDACTONE Take 25 mg by mouth every other day.   torsemide 20 MG tablet Commonly known as:  DEMADEX Take 1 tablet (20 mg total) by mouth 2 (two) times daily as needed.   Vitamin D (Ergocalciferol) 50000 units Caps capsule Commonly known as:  DRISDOL Take 50,000 Units by mouth every 7 (seven) days.            Discharge Care Instructions        Start     Ordered   03/01/17 0000  lactulose ([place]) 10 GM/15ML solution  Every other day     03/01/17 1100   03/01/17 0000  aspirin EC 325 MG tablet  Daily     03/01/17 1112         Total Time in preparing paper work, data evaluation and todays exam - 35 minutes  Dustin Flock M.D on 03/01/2017 at 1:22 PM  [facility]   Office  [phone]

## 2017-03-04 DIAGNOSIS — K7460 Unspecified cirrhosis of liver: Secondary | ICD-10-CM | POA: Diagnosis not present

## 2017-03-04 DIAGNOSIS — F0390 Unspecified dementia without behavioral disturbance: Secondary | ICD-10-CM | POA: Diagnosis not present

## 2017-03-04 DIAGNOSIS — D500 Iron deficiency anemia secondary to blood loss (chronic): Secondary | ICD-10-CM | POA: Diagnosis not present

## 2017-03-04 DIAGNOSIS — F329 Major depressive disorder, single episode, unspecified: Secondary | ICD-10-CM | POA: Diagnosis not present

## 2017-03-04 DIAGNOSIS — I5032 Chronic diastolic (congestive) heart failure: Secondary | ICD-10-CM | POA: Diagnosis not present

## 2017-03-04 DIAGNOSIS — I110 Hypertensive heart disease with heart failure: Secondary | ICD-10-CM | POA: Diagnosis not present

## 2017-03-04 DIAGNOSIS — I2510 Atherosclerotic heart disease of native coronary artery without angina pectoris: Secondary | ICD-10-CM | POA: Diagnosis not present

## 2017-03-04 DIAGNOSIS — M6281 Muscle weakness (generalized): Secondary | ICD-10-CM | POA: Diagnosis not present

## 2017-03-04 DIAGNOSIS — I480 Paroxysmal atrial fibrillation: Secondary | ICD-10-CM | POA: Diagnosis not present

## 2017-03-05 DIAGNOSIS — F329 Major depressive disorder, single episode, unspecified: Secondary | ICD-10-CM | POA: Diagnosis not present

## 2017-03-05 DIAGNOSIS — Z87440 Personal history of urinary (tract) infections: Secondary | ICD-10-CM | POA: Diagnosis not present

## 2017-03-05 DIAGNOSIS — K760 Fatty (change of) liver, not elsewhere classified: Secondary | ICD-10-CM | POA: Diagnosis not present

## 2017-03-05 DIAGNOSIS — Z23 Encounter for immunization: Secondary | ICD-10-CM | POA: Diagnosis not present

## 2017-03-05 DIAGNOSIS — F419 Anxiety disorder, unspecified: Secondary | ICD-10-CM | POA: Diagnosis not present

## 2017-03-05 DIAGNOSIS — I480 Paroxysmal atrial fibrillation: Secondary | ICD-10-CM | POA: Diagnosis not present

## 2017-03-05 DIAGNOSIS — I10 Essential (primary) hypertension: Secondary | ICD-10-CM | POA: Diagnosis not present

## 2017-03-05 DIAGNOSIS — D649 Anemia, unspecified: Secondary | ICD-10-CM | POA: Diagnosis not present

## 2017-03-05 DIAGNOSIS — Z09 Encounter for follow-up examination after completed treatment for conditions other than malignant neoplasm: Secondary | ICD-10-CM | POA: Diagnosis not present

## 2017-03-05 DIAGNOSIS — I5032 Chronic diastolic (congestive) heart failure: Secondary | ICD-10-CM | POA: Diagnosis not present

## 2017-03-05 NOTE — Progress Notes (Deleted)
Cardiology Office Note  Date:  03/05/2017   ID:  Kathryn Short, DOB 21-May-1922, MRN [id number]  PCP:  Tracie Harrier, MD   No chief complaint on file.   HPI:  Kathryn Short is an 83 year old woman with  paroxysmal atrial fibrillation (last episode in March 6384),  diastolic CHF on a  diuretic daily,  labile hypertension,  sick sinus syndrome, pacemaker placement followed by Dr. Caryl Comes,  in hospital [facility] on 02/16/2012 in atrial fibrillation.  open cholecystectomy, partial colon resection and colostomy, appendectomy, hysterectomy, oophorectomy, GYN malignancy treated with radiation in the 1950s bowel obstruction December 2015 She presents for routine follow-up of her blood pressure, diastolic CHF and atrial fibrillation  Hospital 02/28/2017 GI bleed, anemia   01/04/2017 hospitalization for GI bleed Blood in the toilet, went to the hospital Was anemic Required transfusion 1. Hemoglobin down to 7.5 known soft tissue mass in vaginal area  eliquis stopped , started on asa 325 at discharge   She presents with her daughter on today's visit She has leg swelling Pleural effusion noted in the hospital on x-ray  In follow-up today percent in a wheelchair, in good spirits,  less communicative, likely from hard of hearing Trace to 1+ leg swelling Has been taking torsemide 20 g daily She is taking iron  Takes clonidine and losartan as needed, not regular   Aldactone every other day Ambulating, no recent falls, unsteady gait  Seen by Dr. Caryl Comes on today's visit  LABS:Reviewed with her in detail on today's visit HCT 32 Cr 1.2 TSH 11 down to 8  no significant shortness of breath Working with physical therapy  EKG personally reviewed by myself on todays visit Shows paced rhythm 70 bpm no significant ST or T-wave changes  Other past medical history reviewed  On previous office visit she reported having symptoms of Vomiting, abdominal cramps Went to the hospital and had CT ABD:   aortic atherosclerotic Questionable mass but was seen by OB/GYN and was cleared  Lab work  05/01/2016 CR 1.2,  UTI, took ABX Enterococcus, sen to nitrofirantoin total chol 207, LDL 112,  Lab work January 2018 creatinine 1.3 Most recent creatinine 1.2 Chronic urinary tract infection TSH 11  EKG on today's visit shows paced rhythm, atrial paced, 61 bpm   Other past medical history clinic 8/9, Noted to have increased atrial fibrillation burden on pacer download January 03 2016 recommended she increase her amiodarone, is on anticoagulation Low BP, head fullness Recommended that she restart her Aldactone as blood pressure improves  Seen by Dr. Caryl Comes August 15, recommended she increase her torsemide for several days Seen by primary care 8/15: urine UTI No Urine cx available to date BMP shows creatinine continues to run higher consistent with prerenal state Taking torsemide daily with 1.5 torsemide on weekends, Aldactone added every other day  Previous CT scan done by primary care showing small to moderate right pleural effusion, evidence of cirrhosis Mention of cirrhosis back on CT scan in 2015 Mildly elevated LFTs over the past year  in the hospital 05/25/2014, with small bowel obstruction. She was placed on bowel rest, IV fluids and she passed her bowels. She did not require surgery  Echocardiogram done in the hospital 05/25/2014 showing ejection fraction greater than 55%, moderate pulmonary hypertension, mild to moderate TR  Earlier in 2015, she had atrial tachycardia and pacer was tracking the atrial rhythm, Her device was reprogrammed in the office for DDI  She was continued on her beta blocker and amiodarone and  rhythm broke, converting back to normal sinus rhythm. Pacemaker has been since changed back to her prior settings  She is reluctant to use a walker, still uses a cane. Anticoagulation discussed previouslywith the patient and her daughter. They are not  interested. They preferred to stay on aspirin  history of falls with trauma requiring stitches. None recently Patient and daughter are afraid of her fall risk, other complications from anticoagulation. Currently not on anticoagulation at their request. They are aware of risks and benefits associated with arrhythmia and stroke.  Previous lab work; Total cholesterol 189, LDL 104, hematocrit 38, creatinine 0.8  previous visits to the emergency room with chest pain, dizziness, malaise. She had a problem on isosorbide in the past. Feelings of insomnia, and anxious feeling, chest pain episodes at night that wake her from sleep, weakness, nausea, dizziness. She has obstructive sleep apnea and does not use her BiPAP at nighttime  Echocardiogram in the hospital showed ejection fraction 50-55%, moderate LVH, mild to moderate TR, right ventricular systolic pressure 40 mm of mercury, mild to moderate aortic valve insufficiency  PMH:   has a past medical history of Atrial fibrillation ([place]); Bowel obstruction ([facility]); CHF (congestive heart failure) ([place]); Fall; GERD (gastroesophageal reflux disease); Hypertension; Hypothyroidism; Iron deficiency anemia due to chronic blood loss (02/05/2017); Iron deficiency anemia due to chronic blood loss (02/05/2017); Melanoma ([place]); OSA (obstructive sleep apnea); and Uterine cancer ([place]).  PSH:    Past Surgical History:  Procedure Laterality Date  . CHOLECYSTECTOMY    . COLOSTOMY     For perforation  . Cytoscopy    . EP IMPLANTABLE DEVICE N/A 09/07/2015   Procedure:  PPM Generator Changeout;  Surgeon: Deboraha Sprang, MD;  Location: [facility];  Service: Cardiovascular;  Laterality: N/A;  . INSERT / REPLACE / REMOVE PACEMAKER     Implantation-[employer]  . SKIN BIOPSY     face  . TOTAL ABDOMINAL HYSTERECTOMY W/ BILATERAL SALPINGOOPHORECTOMY      Current Outpatient Prescriptions  Medication Sig Dispense Refill  . acetaminophen (TYLENOL) 500 MG tablet Take  1,000 mg by mouth daily as needed for mild pain or headache.     . albuterol (PROVENTIL HFA;VENTOLIN HFA) 108 (90 BASE) MCG/ACT inhaler Inhale 2 puffs into the lungs every 6 (six) hours as needed for wheezing or shortness of breath.     Marland Kitchen amiodarone (PACERONE) 200 MG tablet TAKE ONE (1) TABLET BY MOUTH EVERY DAY 90 tablet 3  . aspirin EC 325 MG tablet Take 1 tablet (325 mg total) by mouth daily. 100 tablet 3  . b complex vitamins tablet Take 1 tablet by mouth daily.    . Carboxymethylcellul-Glycerin (LUBRICATING EYE DROPS OP) Place 2 drops into both eyes 2 (two) times daily.    . clonazePAM (KLONOPIN) 0.5 MG tablet Take 0.5 mg by mouth 2 (two) times daily as needed for anxiety.    . cloNIDine (CATAPRES) 0.1 MG tablet Take 1 tablet (0.1 mg total) by mouth as needed. 60 tablet 6  . lactulose (CHRONULAC) 10 GM/15ML solution Take 45 mLs (30 g total) by mouth every other day. 240 mL 0  . levothyroxine (SYNTHROID, LEVOTHROID) 75 MCG tablet Take 75 mcg by mouth daily.    Marland Kitchen losartan (COZAAR) 100 MG tablet Take 1 tablet (100 mg total) by mouth daily as needed. 30 tablet 6  . metoprolol (LOPRESSOR) 50 MG tablet Take 1 tablet (50 mg total) by mouth 2 (two) times daily. 180 tablet 3  . montelukast (SINGULAIR)  10 MG tablet Take 10 mg by mouth at bedtime.    . Multiple Vitamin (MULTIVITAMIN) tablet Take 1 tablet by mouth daily.    . Multiple Vitamins-Minerals (PRESERVISION AREDS 2 PO) Take by mouth 2 (two) times daily.    . nitroGLYCERIN (NITROSTAT) 0.4 MG SL tablet Place 1 tablet (0.4 mg total) under the tongue every 5 (five) minutes as needed for chest pain. 25 tablet 6  . potassium chloride (K-DUR,KLOR-CON) 10 MEQ tablet Take 1 tablet (10 mEq total) by mouth daily as needed. 90 tablet 3  . sertraline (ZOLOFT) 50 MG tablet Take 50 mg by mouth daily.    Marland Kitchen spironolactone (ALDACTONE) 25 MG tablet Take 25 mg by mouth every other day.     . torsemide (DEMADEX) 20 MG tablet Take 1 tablet (20 mg total) by mouth 2  (two) times daily as needed. 180 tablet 3  . Vitamin D, Ergocalciferol, (DRISDOL) 50000 units CAPS capsule Take 50,000 Units by mouth every 7 (seven) days.     No current facility-administered medications for this visit.      Allergies:   Ace inhibitors; Ambien [zolpidem tartrate]; Bactrim [sulfamethoxazole-trimethoprim]; Digoxin and related; Hctz [hydrochlorothiazide]; and Lunesta [eszopiclone]   Social History:  The patient  reports that she has never smoked. She has never used smokeless tobacco. She reports that she does not drink alcohol or use drugs.   Family History:   Family history is unknown by patient.    Review of Systems: Review of Systems  Constitutional: Negative.   Respiratory: Negative.   Cardiovascular: Positive for leg swelling.  Gastrointestinal: Negative.   Musculoskeletal: Negative.   Neurological: Negative.   Psychiatric/Behavioral: Negative.   All other systems reviewed and are negative.    PHYSICAL EXAM: VS:  There were no vitals taken for this visit. , BMI There is no height or weight on file to calculate BMI. GEN: Well nourished, well developed, in no acute distress , Hard of hearing, presents in a wheelchair  HEENT: normal  Neck: no JVD, carotid bruits, or masses Cardiac: RRR; no murmurs, rubs, or gallops, 1+ pitting edema  bilateral lower extremities to the mid shins Respiratory:  clear to auscultation bilaterally, normal work of breathing GI: soft, nontender, nondistended, + BS MS: no deformity or atrophy  Skin: warm and dry, no rash Neuro:  Strength and sensation are intact Psych: euthymic mood, full affect    Recent Labs: 01/02/2017: TSH 9.127 03/01/2017: ALT 49; BUN 29; Creatinine, Ser 1.26; Hemoglobin 8.8; Platelets 214; Potassium 3.5; Sodium 138    Lipid Panel No results found for: CHOL, HDL, LDLCALC, TRIG    Wt Readings from Last 3 Encounters:  02/28/17 142 lb (64.4 kg)  02/05/17 142 lb 3 oz (64.5 kg)  01/08/17 144 lb 8 oz (65.5  kg)       ASSESSMENT AND PLAN:  Essential hypertension - Plan: EKG 12-Lead Blood pressure well controlled, no changes to her medications  Paroxysmal atrial fibrillation ([place]) - Plan: EKG 12-Lead Paced rhythm, tolerating anticoagulation On low-dose amiodarone  Chronotropic incompetence with sinus node dysfunction  ([place]) - Plan: EKG 12-Lead Atrial paced rhythm, Managed by Dr. Caryl Comes  GI bleed Case discussed with Dr. Caryl Comes on today's visit We will hold aspirin, start low-dose eliquis 2.5 mg twice a day Recommended she discuss iron infusion with primary care through the cancer clinic/infusion center  Chronic diastolic CHF (congestive heart failure) ([place]) - Plan: EKG 12-Lead Torsemide 20 mg daily, consider extra torsemide for several days for worsening leg  edema, pleural effusion   Total encounter time more than 25 minutes  Greater than 50% was spent in counseling and coordination of care with the patient   Disposition:   F/U  2 mo   No orders of the defined types were placed in this encounter.    Signed, Esmond Plants, M.D., Ph.D. 03/05/2017  [place], [place]

## 2017-03-06 DIAGNOSIS — F329 Major depressive disorder, single episode, unspecified: Secondary | ICD-10-CM | POA: Diagnosis not present

## 2017-03-06 DIAGNOSIS — D500 Iron deficiency anemia secondary to blood loss (chronic): Secondary | ICD-10-CM | POA: Diagnosis not present

## 2017-03-06 DIAGNOSIS — I5032 Chronic diastolic (congestive) heart failure: Secondary | ICD-10-CM | POA: Diagnosis not present

## 2017-03-06 DIAGNOSIS — I110 Hypertensive heart disease with heart failure: Secondary | ICD-10-CM | POA: Diagnosis not present

## 2017-03-06 DIAGNOSIS — I2510 Atherosclerotic heart disease of native coronary artery without angina pectoris: Secondary | ICD-10-CM | POA: Diagnosis not present

## 2017-03-06 DIAGNOSIS — I480 Paroxysmal atrial fibrillation: Secondary | ICD-10-CM | POA: Diagnosis not present

## 2017-03-06 DIAGNOSIS — F0390 Unspecified dementia without behavioral disturbance: Secondary | ICD-10-CM | POA: Diagnosis not present

## 2017-03-06 DIAGNOSIS — M6281 Muscle weakness (generalized): Secondary | ICD-10-CM | POA: Diagnosis not present

## 2017-03-06 DIAGNOSIS — K7460 Unspecified cirrhosis of liver: Secondary | ICD-10-CM | POA: Diagnosis not present

## 2017-03-07 ENCOUNTER — Ambulatory Visit: Payer: Medicare HMO | Admitting: Cardiovascular Disease

## 2017-03-07 ENCOUNTER — Telehealth: Payer: Self-pay | Admitting: *Deleted

## 2017-03-07 NOTE — Progress Notes (Signed)
Cardiology Office Note  Date:  03/08/2017   ID:  DUSTI TETRO, DOB Jun 04, 1922, MRN [id number]  PCP:  Tracie Harrier, MD   Chief Complaint  Patient presents with  . other    F/u ED c/o low BP and edema legs. Meds reviewed verbally with pt.    HPI:  Kathryn Short is an 83 year old woman with  paroxysmal atrial fibrillation (last episode in March 9509),  diastolic CHF on a  diuretic daily,  labile hypertension,  sick sinus syndrome, pacemaker placement followed by Dr. Caryl Comes,  in hospital [facility] on 02/16/2012 in atrial fibrillation.  open cholecystectomy, partial colon resection and colostomy, appendectomy, hysterectomy, oophorectomy, GYN malignancy treated with radiation in the 1950s bowel obstruction December 2015 She presents for routine follow-up of her blood pressure, diastolic CHF and atrial fibrillation  hospitalization for GI bleed 01/02/2017 Blood in the toilet, went to the hospital Was anemic Required transfusion 1. Hemoglobin down to 7.5 known soft tissue mass in vaginal area  eliquis stopped , started on asa 325 at discharge (?)  Asa changed to eliquis 2.5 BID as an outpt  Readmission for GI bleed 02/28/2017 Weak, HGB 7.6 PRBC transfusion, up to 8.8 at the time of discharge Again was discharged on aspirin As an outpatient we recommended she hold the aspirin given no stroke benefit from her atrial fibrillation  In follow-up today she has slight worsening of her leg swelling, general malaise, fatigue Reports having a cough that comes and goes  less communicative, likely from hard of hearing Has been taking torsemide 20 g daily Recently received iron infusion She is having bad diarrhea every other day after she takes lactulose. Feels wiped out  She is not taking clonidine and losartan, blood pressure has been low  EKG personally reviewed by myself on todays visit Shows paced rhythm 61 bpm no significant ST or T-wave changes  Other past medical history reviewed   On previous office visit she reported having symptoms of Vomiting, abdominal cramps Went to the hospital and had CT ABD:  aortic atherosclerotic Questionable mass but was seen by OB/GYN and was cleared  Lab work January 2018 creatinine 1.3 Most recent creatinine 1.2 Chronic urinary tract infection TSH 11  EKG on today's visit shows paced rhythm, atrial paced, 61 bpm   Other past medical history clinic 8/9, Noted to have increased atrial fibrillation burden on pacer download January 03 2016 recommended she increase her amiodarone, is on anticoagulation Low BP, head fullness Recommended that she restart her Aldactone as blood pressure improves  Seen by Dr. Caryl Comes August 15, recommended she increase her torsemide for several days Seen by primary care 8/15: urine UTI No Urine cx available to date BMP shows creatinine continues to run higher consistent with prerenal state Taking torsemide daily with 1.5 torsemide on weekends, Aldactone added every other day  Previous CT scan done by primary care showing small to moderate right pleural effusion, evidence of cirrhosis Mention of cirrhosis back on CT scan in 2015 Mildly elevated LFTs over the past year  in the hospital 05/25/2014, with small bowel obstruction. She was placed on bowel rest, IV fluids and she passed her bowels. She did not require surgery  Echocardiogram done in the hospital 05/25/2014 showing ejection fraction greater than 55%, moderate pulmonary hypertension, mild to moderate TR  Earlier in 2015, she had atrial tachycardia and pacer was tracking the atrial rhythm, Her device was reprogrammed in the office for DDI  She was continued on her beta blocker and  amiodarone and rhythm broke, converting back to normal sinus rhythm. Pacemaker has been since changed back to her prior settings  She is reluctant to use a walker, still uses a cane. Anticoagulation discussed previouslywith the patient and her daughter. They  are not interested. They preferred to stay on aspirin  history of falls with trauma requiring stitches. None recently Patient and daughter are afraid of her fall risk, other complications from anticoagulation. Currently not on anticoagulation at their request. They are aware of risks and benefits associated with arrhythmia and stroke.  Previous lab work; Total cholesterol 189, LDL 104, hematocrit 38, creatinine 0.8  previous visits to the emergency room with chest pain, dizziness, malaise. She had a problem on isosorbide in the past. Feelings of insomnia, and anxious feeling, chest pain episodes at night that wake her from sleep, weakness, nausea, dizziness. She has obstructive sleep apnea and does not use her BiPAP at nighttime  Echocardiogram in the hospital showed ejection fraction 50-55%, moderate LVH, mild to moderate TR, right ventricular systolic pressure 40 mm of mercury, mild to moderate aortic valve insufficiency  PMH:   has a past medical history of Atrial fibrillation ([place]); Bowel obstruction (HCC); CHF (congestive heart failure) ([place]); Fall; GERD (gastroesophageal reflux disease); Hypertension; Hypothyroidism; Iron deficiency anemia due to chronic blood loss (02/05/2017); Iron deficiency anemia due to chronic blood loss (02/05/2017); Melanoma ([place]); OSA (obstructive sleep apnea); and Uterine cancer ([place]).  PSH:    Past Surgical History:  Procedure Laterality Date  . CHOLECYSTECTOMY    . COLOSTOMY     For perforation  . Cytoscopy    . EP IMPLANTABLE DEVICE N/A 09/07/2015   Procedure:  PPM Generator Changeout;  Surgeon: Deboraha Sprang, MD;  Location: [place] [facility];  Service: Cardiovascular;  Laterality: N/A;  . INSERT / REPLACE / REMOVE PACEMAKER     Implantation-[employer]  . SKIN BIOPSY     face  . TOTAL ABDOMINAL HYSTERECTOMY W/ BILATERAL SALPINGOOPHORECTOMY      Current Outpatient Prescriptions  Medication Sig Dispense Refill  . acetaminophen (TYLENOL) 500 MG  tablet Take 1,000 mg by mouth daily as needed for mild pain or headache.     . albuterol (PROVENTIL HFA;VENTOLIN HFA) 108 (90 BASE) MCG/ACT inhaler Inhale 2 puffs into the lungs every 6 (six) hours as needed for wheezing or shortness of breath.     Marland Kitchen amiodarone (PACERONE) 200 MG tablet TAKE ONE (1) TABLET BY MOUTH EVERY DAY 90 tablet 3  . b complex vitamins tablet Take 1 tablet by mouth daily.    . Carboxymethylcellul-Glycerin (LUBRICATING EYE DROPS OP) Place 2 drops into both eyes 2 (two) times daily.    . clonazePAM (KLONOPIN) 0.5 MG tablet Take 0.5 mg by mouth 2 (two) times daily as needed for anxiety.    . cloNIDine (CATAPRES) 0.1 MG tablet Take 1 tablet (0.1 mg total) by mouth as needed. 60 tablet 6  . lactulose (CHRONULAC) 10 GM/15ML solution Take 45 mLs (30 g total) by mouth every other day. 240 mL 0  . levothyroxine (SYNTHROID, LEVOTHROID) 75 MCG tablet Take 75 mcg by mouth daily.    Marland Kitchen losartan (COZAAR) 100 MG tablet Take 1 tablet (100 mg total) by mouth daily as needed. 30 tablet 6  . metoprolol (LOPRESSOR) 50 MG tablet Take 1 tablet (50 mg total) by mouth 2 (two) times daily. 180 tablet 3  . montelukast (SINGULAIR) 10 MG tablet Take 10 mg by mouth at bedtime.    . Multiple Vitamin (MULTIVITAMIN)  tablet Take 1 tablet by mouth daily.    . Multiple Vitamins-Minerals (PRESERVISION AREDS 2 PO) Take by mouth 2 (two) times daily.    . nitroGLYCERIN (NITROSTAT) 0.4 MG SL tablet Place 1 tablet (0.4 mg total) under the tongue every 5 (five) minutes as needed for chest pain. 25 tablet 6  . potassium chloride (K-DUR,KLOR-CON) 10 MEQ tablet Take 1 tablet (10 mEq total) by mouth daily as needed. 90 tablet 3  . sertraline (ZOLOFT) 50 MG tablet Take 50 mg by mouth daily.    Marland Kitchen spironolactone (ALDACTONE) 25 MG tablet Take 25 mg by mouth every other day.     . torsemide (DEMADEX) 20 MG tablet Take 1 tablet (20 mg total) by mouth 2 (two) times daily as needed. 180 tablet 3  . Vitamin D, Ergocalciferol,  (DRISDOL) 50000 units CAPS capsule Take 50,000 Units by mouth every 7 (seven) days.    Marland Kitchen aspirin EC 325 MG tablet Take 1 tablet (325 mg total) by mouth daily. (Patient not taking: Reported on 03/08/2017) 100 tablet 3   No current facility-administered medications for this visit.      Allergies:   Ace inhibitors; Ambien [zolpidem tartrate]; Bactrim [sulfamethoxazole-trimethoprim]; Digoxin and related; Hctz [hydrochlorothiazide]; and Lunesta [eszopiclone]   Social History:  The patient  reports that she has never smoked. She has never used smokeless tobacco. She reports that she does not drink alcohol or use drugs.   Family History:   Family history is unknown by patient.    Review of Systems: Review of Systems  Constitutional: Positive for malaise/fatigue.  Respiratory: Positive for cough.   Cardiovascular: Positive for leg swelling.  Gastrointestinal: Negative.   Musculoskeletal: Negative.   Neurological: Positive for weakness.  Psychiatric/Behavioral: Negative.   All other systems reviewed and are negative.    PHYSICAL EXAM: VS:  BP (!) 110/50 (BP Location: Right Arm, Patient Position: Sitting, Cuff Size: Normal)   Pulse 61   Ht 5\' 2"  (1.575 m)   Wt 142 lb 3 oz (64.5 kg)   BMI 26.01 kg/m  , BMI Body mass index is 26.01 kg/m. GEN: well developed, in no acute distress , Hard of hearing, presents in a wheelchair  Appears pale HEENT: normal  Neck: no JVD, carotid bruits, or masses Cardiac: RRR; no murmurs, rubs, or gallops, 1+ pitting edema  bilateral lower extremities to the mid shins Respiratory:  clear to auscultation bilaterally, normal work of breathing GI: soft, nontender, nondistended, + BS MS: no deformity or atrophy  Skin: warm and dry, no rash Neuro:  Strength and sensation are intact Psych: euthymic mood, full affect    Recent Labs: 01/02/2017: TSH 9.127 03/01/2017: ALT 49; BUN 29; Creatinine, Ser 1.26; Hemoglobin 8.8; Platelets 214; Potassium 3.5; Sodium 138     Lipid Panel No results found for: CHOL, HDL, LDLCALC, TRIG    Wt Readings from Last 3 Encounters:  03/08/17 142 lb 3 oz (64.5 kg)  02/28/17 142 lb (64.4 kg)  02/05/17 142 lb 3 oz (64.5 kg)       ASSESSMENT AND PLAN:  Essential hypertension - Plan: EKG 12-Lead Blood pressure well controlled, no changes to her medications Recommended she continue to hold aspirin as there is no clinical benefit in terms of her atrial fibrillation  Paroxysmal atrial fibrillation (HCC) - Plan: EKG 12-Lead Paced rhythm, All anticoagulation has been held On low-dose amiodarone  Chronotropic incompetence with sinus node dysfunction  (HCC) - Plan: EKG 12-Lead Atrial paced rhythm, Managed by Dr. Caryl Comes  GI  bleed Recommended we continue to hold aspirin and Eliquis Suggested she decrease the lactulose dose in half given profound diarrhea every other day  Chronic diastolic CHF (congestive heart failure) ([place]) - Plan: EKG 12-Lead Torsemide 20 mg daily,  Lower extremity swelling likely exacerbated by anemia  Anemia Recent iron infusion Stressed importance of good nutrition We'll avoid aspirin and other anticoagulation   Total encounter time more than 25 minutes  Greater than 50% was spent in counseling and coordination of care with the patient   Disposition:   F/U  2 mo   Orders Placed This Encounter  Procedures  . EKG 12-Lead     Signed, Esmond Plants, M.D., Ph.D. 03/08/2017  [place], [place]

## 2017-03-07 NOTE — Telephone Encounter (Signed)
Daughter states that they had her continue aspirin and stop the Eliquis. Reviewed that Dr. Rockey Situ recommended she hold the aspirin and she verbalized understanding. Patient has appointment later today with Dr. Rockey Situ and advised her to just review this with him as well. She verbalized understanding with no further questions at this time.

## 2017-03-07 NOTE — Telephone Encounter (Signed)
-----   Message from Minna Merritts, MD sent at 03/05/2017 10:27 PM EDT ----- Can we make sure she is NOT taking aspirin eliquis held in the hospital for bleeding Would not take aspirin.  thx TG

## 2017-03-08 ENCOUNTER — Ambulatory Visit (INDEPENDENT_AMBULATORY_CARE_PROVIDER_SITE_OTHER): Payer: Medicare HMO | Admitting: Cardiovascular Disease

## 2017-03-08 ENCOUNTER — Encounter: Payer: Self-pay | Admitting: Cardiovascular Disease

## 2017-03-08 VITALS — BP 110/50 | HR 61 | Ht 62.0 in | Wt 142.2 lb

## 2017-03-08 DIAGNOSIS — K56609 Unspecified intestinal obstruction, unspecified as to partial versus complete obstruction: Secondary | ICD-10-CM

## 2017-03-08 DIAGNOSIS — R531 Weakness: Secondary | ICD-10-CM

## 2017-03-08 DIAGNOSIS — I471 Supraventricular tachycardia: Secondary | ICD-10-CM | POA: Diagnosis not present

## 2017-03-08 DIAGNOSIS — I495 Sick sinus syndrome: Secondary | ICD-10-CM

## 2017-03-08 DIAGNOSIS — I10 Essential (primary) hypertension: Secondary | ICD-10-CM | POA: Diagnosis not present

## 2017-03-08 DIAGNOSIS — I5032 Chronic diastolic (congestive) heart failure: Secondary | ICD-10-CM

## 2017-03-08 DIAGNOSIS — K922 Gastrointestinal hemorrhage, unspecified: Secondary | ICD-10-CM

## 2017-03-08 DIAGNOSIS — I4819 Other persistent atrial fibrillation: Secondary | ICD-10-CM

## 2017-03-08 DIAGNOSIS — I481 Persistent atrial fibrillation: Secondary | ICD-10-CM

## 2017-03-08 DIAGNOSIS — Z950 Presence of cardiac pacemaker: Secondary | ICD-10-CM

## 2017-03-08 DIAGNOSIS — Z7189 Other specified counseling: Secondary | ICD-10-CM

## 2017-03-08 DIAGNOSIS — I498 Other specified cardiac arrhythmias: Secondary | ICD-10-CM

## 2017-03-08 DIAGNOSIS — I4719 Other supraventricular tachycardia: Secondary | ICD-10-CM

## 2017-03-08 NOTE — Patient Instructions (Signed)
Medication Instructions:   No medication changes made  Labwork:  No new labs needed  Testing/Procedures:  No further testing at this time   Follow-Up: It was a pleasure seeing you in the office today. Please call us if you have new issues that need to be addressed before your next appt.  [phone]  Your physician wants you to follow-up in: 6 weeks   If you need a refill on your cardiac medications before your next appointment, please call your pharmacy.

## 2017-03-12 DIAGNOSIS — K7460 Unspecified cirrhosis of liver: Secondary | ICD-10-CM | POA: Diagnosis not present

## 2017-03-12 DIAGNOSIS — F329 Major depressive disorder, single episode, unspecified: Secondary | ICD-10-CM | POA: Diagnosis not present

## 2017-03-12 DIAGNOSIS — I480 Paroxysmal atrial fibrillation: Secondary | ICD-10-CM | POA: Diagnosis not present

## 2017-03-12 DIAGNOSIS — D500 Iron deficiency anemia secondary to blood loss (chronic): Secondary | ICD-10-CM | POA: Diagnosis not present

## 2017-03-12 DIAGNOSIS — I110 Hypertensive heart disease with heart failure: Secondary | ICD-10-CM | POA: Diagnosis not present

## 2017-03-12 DIAGNOSIS — I2510 Atherosclerotic heart disease of native coronary artery without angina pectoris: Secondary | ICD-10-CM | POA: Diagnosis not present

## 2017-03-12 DIAGNOSIS — F0390 Unspecified dementia without behavioral disturbance: Secondary | ICD-10-CM | POA: Diagnosis not present

## 2017-03-12 DIAGNOSIS — M6281 Muscle weakness (generalized): Secondary | ICD-10-CM | POA: Diagnosis not present

## 2017-03-12 DIAGNOSIS — I5032 Chronic diastolic (congestive) heart failure: Secondary | ICD-10-CM | POA: Diagnosis not present

## 2017-03-12 DIAGNOSIS — Z87440 Personal history of urinary (tract) infections: Secondary | ICD-10-CM | POA: Diagnosis not present

## 2017-03-13 ENCOUNTER — Encounter: Payer: Self-pay | Admitting: Oncology

## 2017-03-13 ENCOUNTER — Inpatient Hospital Stay: Payer: Medicare HMO | Attending: Oncology

## 2017-03-13 ENCOUNTER — Inpatient Hospital Stay (HOSPITAL_BASED_OUTPATIENT_CLINIC_OR_DEPARTMENT_OTHER): Payer: Medicare HMO | Admitting: Oncology

## 2017-03-13 VITALS — BP 113/55 | HR 67 | Temp 98.3°F | Wt 139.0 lb

## 2017-03-13 DIAGNOSIS — R0602 Shortness of breath: Secondary | ICD-10-CM

## 2017-03-13 DIAGNOSIS — R5383 Other fatigue: Secondary | ICD-10-CM

## 2017-03-13 DIAGNOSIS — M8580 Other specified disorders of bone density and structure, unspecified site: Secondary | ICD-10-CM | POA: Diagnosis not present

## 2017-03-13 DIAGNOSIS — Z923 Personal history of irradiation: Secondary | ICD-10-CM | POA: Diagnosis not present

## 2017-03-13 DIAGNOSIS — Z79899 Other long term (current) drug therapy: Secondary | ICD-10-CM | POA: Diagnosis not present

## 2017-03-13 DIAGNOSIS — K7460 Unspecified cirrhosis of liver: Secondary | ICD-10-CM | POA: Insufficient documentation

## 2017-03-13 DIAGNOSIS — D509 Iron deficiency anemia, unspecified: Secondary | ICD-10-CM

## 2017-03-13 DIAGNOSIS — Z7982 Long term (current) use of aspirin: Secondary | ICD-10-CM | POA: Diagnosis not present

## 2017-03-13 DIAGNOSIS — I509 Heart failure, unspecified: Secondary | ICD-10-CM

## 2017-03-13 DIAGNOSIS — E039 Hypothyroidism, unspecified: Secondary | ICD-10-CM

## 2017-03-13 DIAGNOSIS — I4891 Unspecified atrial fibrillation: Secondary | ICD-10-CM | POA: Diagnosis not present

## 2017-03-13 DIAGNOSIS — K921 Melena: Secondary | ICD-10-CM | POA: Diagnosis not present

## 2017-03-13 DIAGNOSIS — Z8542 Personal history of malignant neoplasm of other parts of uterus: Secondary | ICD-10-CM | POA: Diagnosis not present

## 2017-03-13 DIAGNOSIS — D649 Anemia, unspecified: Secondary | ICD-10-CM

## 2017-03-13 DIAGNOSIS — Z85820 Personal history of malignant melanoma of skin: Secondary | ICD-10-CM | POA: Diagnosis not present

## 2017-03-13 DIAGNOSIS — K219 Gastro-esophageal reflux disease without esophagitis: Secondary | ICD-10-CM | POA: Insufficient documentation

## 2017-03-13 DIAGNOSIS — K56609 Unspecified intestinal obstruction, unspecified as to partial versus complete obstruction: Secondary | ICD-10-CM | POA: Diagnosis not present

## 2017-03-13 DIAGNOSIS — D500 Iron deficiency anemia secondary to blood loss (chronic): Secondary | ICD-10-CM

## 2017-03-13 DIAGNOSIS — G4730 Sleep apnea, unspecified: Secondary | ICD-10-CM | POA: Diagnosis not present

## 2017-03-13 LAB — CBC WITH DIFFERENTIAL/PLATELET
Basophils Absolute: 0 10*3/uL (ref 0–0.1)
Basophils Relative: 1 %
EOS ABS: 0.2 10*3/uL (ref 0–0.7)
EOS PCT: 2 %
HCT: 31 % — ABNORMAL LOW (ref 35.0–47.0)
Hemoglobin: 10.1 g/dL — ABNORMAL LOW (ref 12.0–16.0)
LYMPHS ABS: 1.2 10*3/uL (ref 1.0–3.6)
Lymphocytes Relative: 14 %
MCH: 28.4 pg (ref 26.0–34.0)
MCHC: 32.5 g/dL (ref 32.0–36.0)
MCV: 87.3 fL (ref 80.0–100.0)
MONOS PCT: 7 %
Monocytes Absolute: 0.5 10*3/uL (ref 0.2–0.9)
Neutro Abs: 6.3 10*3/uL (ref 1.4–6.5)
Neutrophils Relative %: 76 %
PLATELETS: 202 10*3/uL (ref 150–440)
RBC: 3.55 MIL/uL — AB (ref 3.80–5.20)
RDW: 21.6 % — AB (ref 11.5–14.5)
WBC: 8.2 10*3/uL (ref 3.6–11.0)

## 2017-03-13 LAB — IRON AND TIBC
IRON: 27 ug/dL — AB (ref 28–170)
Saturation Ratios: 9 % — ABNORMAL LOW (ref 10.4–31.8)
TIBC: 301 ug/dL (ref 250–450)
UIBC: 274 ug/dL

## 2017-03-13 LAB — FERRITIN: Ferritin: 170 ng/mL (ref 11–307)

## 2017-03-13 NOTE — Progress Notes (Signed)
Patient here today for follow up.   

## 2017-03-13 NOTE — Progress Notes (Signed)
[facility] Follow Up Note  Patient Care Team: Tracie Harrier, MD as PCP - General (Internal Medicine) Minna Merritts, MD as Consulting Physician (Cardiology)  REASON FOR VISIT Follow up for treatment of iron deficiency anemia  PERTINENT HEMATOLOGY ONCOLOGY HISTORY  1 Normand Sloop 83 y.o. female with PMH listed as below presents for follow up visit for management of chronic anemia.   Patient is accompanied by her daughter who provide most information. Patient is hard hearing and poor historian. Per daughter patient has been on Eliquis for a year due to A-Fib. Last Friday, daughter noticed black blood on patient's pad , and later little bit bright red blood mixed with her loose stools (took lactulose). Patient has been feeling more fatigued lately, SOB more than her baseline. Appetite is poor. Eliquis was held, last dose on 12/28/2016.  Patient has labs done earlier this month which showed Hb in 10s. 01/01/2017 when patient was seen by [facility], she had a repeat CBC done which showed acute drop of 2 grams of Hb. Hemoccult was positive with brown stool. A CT abdomen with oral contrast was ordered and scheduled at 3PM today. I was called by NP to see if we can evaluate patient urgently for the acute drop of anemia.  Per daughter patient has a remote history of uterine cancer in 1952, treated with RT.   10 December 2016 She has symptomatic anemia, acute on chronic CHF, hospitalized. She received blood transfusion during her hospitalization. Her Eliquis was initially discontinued. After she was evaluated by cardiology, the decision was made to restart on Eliquis 2.5 mg twice a day due to the high risk A. Fib. She was also seen by gynonc Dr. Leonides Schanz while she was in the hospital, vaginal exam showed a shortened vaginal, no blood or area of excoriation. Soft tissue mass was considered to be stable for years and likely the cause of her bleeding or a new recurrence.   3  She was  evaluated by cardiology outpatient in August 2018 and Elquis was restarted at 2,'5mg'$  BID due to high risk A-Fib. Hemoglobin dropped again to 6.9 and she was sent to ER due to symptomatic anemia. She received 1 unit of PRBC and hemoglobin improved. She also received a dose of IV venofer inpatient. Off Eliquis.   4 IV iron treatments: she got IV venofer on 02/08/17, 02/15/17, 02/22/17, and 03/01/17 She still feels fatigue and daughter reports that patient does not eat well. Her thyroid medication has been recently adjusted to 56mg daily by Dr.Hande.    Review of Systems  Constitutional: Positive for fatigue.  HENT:   Positive for hearing loss.   Eyes: Negative.   Respiratory: Positive for shortness of breath.   Cardiovascular: Negative.   Gastrointestinal:       Rectal bleeding  Endocrine: Negative.   Genitourinary: Negative.    Musculoskeletal: Negative.   Skin: Negative.   Neurological: Negative.   Hematological: Negative.   Psychiatric/Behavioral: Negative.     MEDICAL HISTORY: Past Medical History:  Diagnosis Date  . Atrial fibrillation ([facility]   . Bowel obstruction ([facility]   . CHF (congestive heart failure) ([facility]   . Fall   . GERD (gastroesophageal reflux disease)   . Hypertension   . Hypothyroidism    Treated  . Iron deficiency anemia due to chronic blood loss 02/05/2017  . Iron deficiency anemia due to chronic blood loss 02/05/2017  . Melanoma ([facility]    Resected from back  .  OSA (obstructive sleep apnea)    On BiPAP  . Uterine cancer [facility])    s/p radiation therapy in 1952    SURGICAL HISTORY: Past Surgical History:  Procedure Laterality Date  . CHOLECYSTECTOMY    . COLOSTOMY     For perforation  . Cytoscopy    . EP IMPLANTABLE DEVICE N/A 09/07/2015   Procedure:  PPM Generator Changeout;  Surgeon: Deboraha Sprang, MD;  Location: [facility];  Service: Cardiovascular;  Laterality: N/A;  . INSERT / REPLACE / REMOVE PACEMAKER     Implantation-[employer]  . SKIN BIOPSY      face  . TOTAL ABDOMINAL HYSTERECTOMY W/ BILATERAL SALPINGOOPHORECTOMY      SOCIAL HISTORY: Social History   Social History  . Marital status: Widowed    Spouse name: N/A  . Number of children: 5  . Years of education: N/A   Occupational History  .  Retired   Social History Main Topics  . Smoking status: Never Smoker  . Smokeless tobacco: Never Used     Comment: Tobacco use-no  . Alcohol use No  . Drug use: No  . Sexual activity: Not on file   Other Topics Concern  . Not on file   Social History Narrative  . No narrative on file    FAMILY HISTORY Family History  Problem Relation Age of Onset  . Family history unknown: Yes    ALLERGIES:  is allergic to ace inhibitors; ambien [zolpidem tartrate]; bactrim [sulfamethoxazole-trimethoprim]; digoxin and related; hctz [hydrochlorothiazide]; and lunesta [eszopiclone].  MEDICATIONS:  Current Outpatient Prescriptions  Medication Sig Dispense Refill  . acetaminophen (TYLENOL) 500 MG tablet Take 1,000 mg by mouth daily as needed for mild pain or headache.     . albuterol (PROVENTIL HFA;VENTOLIN HFA) 108 (90 BASE) MCG/ACT inhaler Inhale 2 puffs into the lungs every 6 (six) hours as needed for wheezing or shortness of breath.     Marland Kitchen amiodarone (PACERONE) 200 MG tablet TAKE ONE (1) TABLET BY MOUTH EVERY DAY 90 tablet 3  . aspirin EC 325 MG tablet Take 1 tablet (325 mg total) by mouth daily. (Patient not taking: Reported on 03/08/2017) 100 tablet 3  . b complex vitamins tablet Take 1 tablet by mouth daily.    . Carboxymethylcellul-Glycerin (LUBRICATING EYE DROPS OP) Place 2 drops into both eyes 2 (two) times daily.    . clonazePAM (KLONOPIN) 0.5 MG tablet Take 0.5 mg by mouth 2 (two) times daily as needed for anxiety.    . cloNIDine (CATAPRES) 0.1 MG tablet Take 1 tablet (0.1 mg total) by mouth as needed. 60 tablet 6  . lactulose (CHRONULAC) 10 GM/15ML solution Take 45 mLs (30 g total) by mouth every other day. 240 mL 0  .  levothyroxine (SYNTHROID, LEVOTHROID) 75 MCG tablet Take 75 mcg by mouth daily.    Marland Kitchen losartan (COZAAR) 100 MG tablet Take 1 tablet (100 mg total) by mouth daily as needed. 30 tablet 6  . metoprolol (LOPRESSOR) 50 MG tablet Take 1 tablet (50 mg total) by mouth 2 (two) times daily. 180 tablet 3  . montelukast (SINGULAIR) 10 MG tablet Take 10 mg by mouth at bedtime.    . Multiple Vitamin (MULTIVITAMIN) tablet Take 1 tablet by mouth daily.    . Multiple Vitamins-Minerals (PRESERVISION AREDS 2 PO) Take by mouth 2 (two) times daily.    . nitroGLYCERIN (NITROSTAT) 0.4 MG SL tablet Place 1 tablet (0.4 mg total) under the tongue every 5 (five) minutes as needed  for chest pain. 25 tablet 6  . potassium chloride (K-DUR,KLOR-CON) 10 MEQ tablet Take 1 tablet (10 mEq total) by mouth daily as needed. 90 tablet 3  . sertraline (ZOLOFT) 50 MG tablet Take 50 mg by mouth daily.    Marland Kitchen spironolactone (ALDACTONE) 25 MG tablet Take 25 mg by mouth every other day.     . torsemide (DEMADEX) 20 MG tablet Take 1 tablet (20 mg total) by mouth 2 (two) times daily as needed. 180 tablet 3  . Vitamin D, Ergocalciferol, (DRISDOL) 50000 units CAPS capsule Take 50,000 Units by mouth every 7 (seven) days.     No current facility-administered medications for this visit.     PHYSICAL EXAMINATION:  ECOG PERFORMANCE STATUS: 2 - Symptomatic, <50% confined to bed   Vitals:   03/13/17 1353  BP: (!) 113/55  Pulse: 67  Temp: 98.3 F (36.8 C)    Filed Weights   03/13/17 1353  Weight: 139 lb (63 kg)     Physical Exam GENERAL: No distress, well nourished. pale SKIN:  No rashes or significant lesions  HEAD: Normocephalic, No masses, lesions, tenderness or abnormalities  EYES: . non icteric, pale conjunctivae  ENT: External ears normal ,lips , buccal mucosa, and tongue normal and mucous membranes are moist  LYMPH: No palpable cervical and axillary lymphadenopathy  LUNGS: Clear to auscultation, no crackles or wheezes HEART:  Regular rate & rhythm, no murmurs, no gallops, S1 normal and S2 normal. Bilateral 3+ lower extremity edema.  ABDOMEN: Abdomen soft, non-tender, normal bowel sounds, I did not appreciate any  masses or organomegaly  MUSCULOSKELETAL: No CVA tenderness and no tenderness on percussion of the back or rib cage.  EXTREMITIES: No edema, no skin discoloration or tenderness NEURO: Alert & oriented, no focal motor/sensory deficits.   LABORATORY DATA: I have personally reviewed the data as listed:  Appointment on 03/13/2017  Component Date Value Ref Range Status  . WBC 03/13/2017 8.2  3.6 - 11.0 K/uL Final  . RBC 03/13/2017 3.55* 3.80 - 5.20 MIL/uL Final  . Hemoglobin 03/13/2017 10.1* 12.0 - 16.0 g/dL Final  . HCT 03/13/2017 31.0* 35.0 - 47.0 % Final  . MCV 03/13/2017 87.3  80.0 - 100.0 fL Final  . MCH 03/13/2017 28.4  26.0 - 34.0 pg Final  . MCHC 03/13/2017 32.5  32.0 - 36.0 g/dL Final  . RDW 03/13/2017 21.6* 11.5 - 14.5 % Final  . Platelets 03/13/2017 202  150 - 440 K/uL Final  . Neutrophils Relative % 03/13/2017 76  % Final  . Neutro Abs 03/13/2017 6.3  1.4 - 6.5 K/uL Final  . Lymphocytes Relative 03/13/2017 14  % Final  . Lymphs Abs 03/13/2017 1.2  1.0 - 3.6 K/uL Final  . Monocytes Relative 03/13/2017 7  % Final  . Monocytes Absolute 03/13/2017 0.5  0.2 - 0.9 K/uL Final  . Eosinophils Relative 03/13/2017 2  % Final  . Eosinophils Absolute 03/13/2017 0.2  0 - 0.7 K/uL Final  . Basophils Relative 03/13/2017 1  % Final  . Basophils Absolute 03/13/2017 0.0  0 - 0.1 K/uL Final  Admission on 02/28/2017, Discharged on 03/01/2017  Component Date Value Ref Range Status  . Sodium 02/28/2017 136  135 - 145 mmol/L Final  . Potassium 02/28/2017 4.1  3.5 - 5.1 mmol/L Final  . Chloride 02/28/2017 101  101 - 111 mmol/L Final  . CO2 02/28/2017 27  22 - 32 mmol/L Final  . Glucose, Bld 02/28/2017 118* 65 - 99 mg/dL Final  .  BUN 02/28/2017 35* 6 - 20 mg/dL Final  . Creatinine, Ser 02/28/2017 1.32* 0.44 -  1.00 mg/dL Final  . Calcium 02/28/2017 8.3* 8.9 - 10.3 mg/dL Final  . Total Protein 02/28/2017 6.2* 6.5 - 8.1 g/dL Final  . Albumin 02/28/2017 2.8* 3.5 - 5.0 g/dL Final  . AST 02/28/2017 72* 15 - 41 U/L Final  . ALT 02/28/2017 52  14 - 54 U/L Final  . Alkaline Phosphatase 02/28/2017 119  38 - 126 U/L Final  . Total Bilirubin 02/28/2017 0.5  0.3 - 1.2 mg/dL Final  . GFR calc non Af Amer 02/28/2017 34* >60 mL/min Final  . GFR calc Af Amer 02/28/2017 39* >60 mL/min Final   Comment: (NOTE) The eGFR has been calculated using the CKD EPI equation. This calculation has not been validated in all clinical situations. eGFR's persistently <60 mL/min signify possible Chronic Kidney Disease.   . Anion gap 02/28/2017 8  5 - 15 Final  . WBC 02/28/2017 10.6  3.6 - 11.0 K/uL Final  . RBC 02/28/2017 2.76* 3.80 - 5.20 MIL/uL Final  . Hemoglobin 02/28/2017 7.6* 12.0 - 16.0 g/dL Final  . HCT 02/28/2017 23.6* 35.0 - 47.0 % Final  . MCV 02/28/2017 85.4  80.0 - 100.0 fL Final  . MCH 02/28/2017 27.5  26.0 - 34.0 pg Final  . MCHC 02/28/2017 32.2  32.0 - 36.0 g/dL Final  . RDW 02/28/2017 25.1* 11.5 - 14.5 % Final  . Platelets 02/28/2017 251  150 - 440 K/uL Final  . ABO/RH(D) 02/28/2017 O NEG   Final  . Antibody Screen 02/28/2017 NEG   Final  . Sample Expiration 02/28/2017 03/03/2017   Final  . Unit Number 02/28/2017 [id number]   Final  . Blood Component Type 02/28/2017 RBC, LR IRR   Final  . Unit division 02/28/2017 00   Final  . Status of Unit 02/28/2017 ISSUED,FINAL   Final  . Transfusion Status 02/28/2017 OK TO TRANSFUSE   Final  . Crossmatch Result 02/28/2017 Compatible   Final  . Prothrombin Time 02/28/2017 16.2* 11.4 - 15.2 seconds Final  . INR 02/28/2017 1.31   Final  . Order Confirmation 02/28/2017 ORDER PROCESSED BY BLOOD BANK   Final  . ISSUE DATE / TIME 02/28/2017 [id number]   Final  . Blood Product Unit Number 02/28/2017 [id number]   Final  . PRODUCT CODE 02/28/2017 [id number]   Final   . Unit Type and Rh 02/28/2017 9500   Final  . Blood Product Expiration Date 02/28/2017 [id number]   Final  . WBC 03/01/2017 12.1* 3.6 - 11.0 K/uL Final  . RBC 03/01/2017 3.03* 3.80 - 5.20 MIL/uL Final  . Hemoglobin 03/01/2017 8.8* 12.0 - 16.0 g/dL Final  . HCT 03/01/2017 25.7* 35.0 - 47.0 % Final  . MCV 03/01/2017 84.8  80.0 - 100.0 fL Final  . MCH 03/01/2017 29.0  26.0 - 34.0 pg Final  . MCHC 03/01/2017 34.1  32.0 - 36.0 g/dL Final  . RDW 03/01/2017 21.3* 11.5 - 14.5 % Final  . Platelets 03/01/2017 214  150 - 440 K/uL Final  . Sodium 03/01/2017 138  135 - 145 mmol/L Final  . Potassium 03/01/2017 3.5  3.5 - 5.1 mmol/L Final  . Chloride 03/01/2017 102  101 - 111 mmol/L Final  . CO2 03/01/2017 27  22 - 32 mmol/L Final  . Glucose, Bld 03/01/2017 106* 65 - 99 mg/dL Final  . BUN 03/01/2017 29* 6 - 20 mg/dL Final  . Creatinine, Ser 03/01/2017 1.26* 0.44 - 1.00  mg/dL Final  . Calcium 03/01/2017 8.1* 8.9 - 10.3 mg/dL Final  . Total Protein 03/01/2017 5.8* 6.5 - 8.1 g/dL Final  . Albumin 03/01/2017 2.6* 3.5 - 5.0 g/dL Final  . AST 03/01/2017 76* 15 - 41 U/L Final  . ALT 03/01/2017 49  14 - 54 U/L Final  . Alkaline Phosphatase 03/01/2017 120  38 - 126 U/L Final  . Total Bilirubin 03/01/2017 0.8  0.3 - 1.2 mg/dL Final  . GFR calc non Af Amer 03/01/2017 36* >60 mL/min Final  . GFR calc Af Amer 03/01/2017 41* >60 mL/min Final   Comment: (NOTE) The eGFR has been calculated using the CKD EPI equation. This calculation has not been validated in all clinical situations. eGFR's persistently <60 mL/min signify possible Chronic Kidney Disease.   . Anion gap 03/01/2017 9  5 - 15 Final  . Color, Urine 02/28/2017 YELLOW* YELLOW Final  . APPearance 02/28/2017 CLEAR* CLEAR Final  . Specific Gravity, Urine 02/28/2017 1.013  1.005 - 1.030 Final  . pH 02/28/2017 6.0  5.0 - 8.0 Final  . Glucose, UA 02/28/2017 NEGATIVE  NEGATIVE mg/dL Final  . Hgb urine dipstick 02/28/2017 NEGATIVE  NEGATIVE Final  .  Bilirubin Urine 02/28/2017 NEGATIVE  NEGATIVE Final  . Ketones, ur 02/28/2017 NEGATIVE  NEGATIVE mg/dL Final  . Protein, ur 02/28/2017 NEGATIVE  NEGATIVE mg/dL Final  . Nitrite 02/28/2017 NEGATIVE  NEGATIVE Final  . Leukocytes, UA 02/28/2017 NEGATIVE  NEGATIVE Final  . RBC / HPF 02/28/2017 0-5  0 - 5 RBC/hpf Final  . WBC, UA 02/28/2017 0-5  0 - 5 WBC/hpf Final  . Bacteria, UA 02/28/2017 FEW* NONE SEEN Final  . Squamous Epithelial / LPF 02/28/2017 0-5* NONE SEEN Final  . Mucus 02/28/2017 PRESENT   Final  . Hyaline Casts, UA 02/28/2017 PRESENT   Final    RADIOGRAPHIC STUDIES: I have personally reviewed the radiological images as listed and agree with the findings in the report CT abdomen pelvis 07/03/2016.  IMPRESSION: 1. Abnormal soft tissue mass in the region of the vagina worrisome recurrence of carcinoma. This lesion may invade the adjacent perirectal fat planes and possibly the base of the urinary bladder. 2. Small right pleural effusion. 3. Changes of cirrhosis are again noted. 4. Significant abdominal aortic atherosclerotic change. 5. Small amount of free fluid in the pelvis. 6. Diffuse osteopenia.   ASSESSMENT/PLAN 1. Iron deficiency anemia due to chronic blood loss   2. Other fatigue    1  Iron deficiency, plan IV iron with Venofer '200mg'$  weekly 4, iron panel improved. MCV normalized and Hemoglobin improved to 10.      TSAT at 9, still mildly low. I will give additional one dose of Venofer '200mg'$  given the ongoing chronic blood loss from GI.  2 Fatigue maybe multifactorial due to anemia, hypothyroidism and CHF. Her synthroid was adjusted lately, need to be titrated with TSH and Dr.Hande has been managing that. Continue follow up primary care.   Follow up in 2 months with repeat CBC, iron TIBC, ferritin.  All questions were answered. The patient knows to call the clinic with any problems, questions or concerns.  Earlie Server, MD  03/13/2017 1:32 PM

## 2017-03-14 DIAGNOSIS — I480 Paroxysmal atrial fibrillation: Secondary | ICD-10-CM | POA: Diagnosis not present

## 2017-03-14 DIAGNOSIS — F0390 Unspecified dementia without behavioral disturbance: Secondary | ICD-10-CM | POA: Diagnosis not present

## 2017-03-14 DIAGNOSIS — I5032 Chronic diastolic (congestive) heart failure: Secondary | ICD-10-CM | POA: Diagnosis not present

## 2017-03-14 DIAGNOSIS — I110 Hypertensive heart disease with heart failure: Secondary | ICD-10-CM | POA: Diagnosis not present

## 2017-03-14 DIAGNOSIS — I2510 Atherosclerotic heart disease of native coronary artery without angina pectoris: Secondary | ICD-10-CM | POA: Diagnosis not present

## 2017-03-14 DIAGNOSIS — D500 Iron deficiency anemia secondary to blood loss (chronic): Secondary | ICD-10-CM | POA: Diagnosis not present

## 2017-03-14 DIAGNOSIS — M6281 Muscle weakness (generalized): Secondary | ICD-10-CM | POA: Diagnosis not present

## 2017-03-14 DIAGNOSIS — F329 Major depressive disorder, single episode, unspecified: Secondary | ICD-10-CM | POA: Diagnosis not present

## 2017-03-14 DIAGNOSIS — K7460 Unspecified cirrhosis of liver: Secondary | ICD-10-CM | POA: Diagnosis not present

## 2017-03-15 ENCOUNTER — Other Ambulatory Visit: Payer: Self-pay | Admitting: Internal Medicine

## 2017-03-15 DIAGNOSIS — Z1231 Encounter for screening mammogram for malignant neoplasm of breast: Secondary | ICD-10-CM

## 2017-03-18 DIAGNOSIS — H353221 Exudative age-related macular degeneration, left eye, with active choroidal neovascularization: Secondary | ICD-10-CM | POA: Diagnosis not present

## 2017-03-19 ENCOUNTER — Inpatient Hospital Stay: Payer: Medicare HMO

## 2017-03-19 VITALS — BP 133/75 | HR 60 | Resp 20

## 2017-03-19 DIAGNOSIS — R0602 Shortness of breath: Secondary | ICD-10-CM | POA: Diagnosis not present

## 2017-03-19 DIAGNOSIS — I4891 Unspecified atrial fibrillation: Secondary | ICD-10-CM | POA: Diagnosis not present

## 2017-03-19 DIAGNOSIS — I509 Heart failure, unspecified: Secondary | ICD-10-CM | POA: Diagnosis not present

## 2017-03-19 DIAGNOSIS — D500 Iron deficiency anemia secondary to blood loss (chronic): Secondary | ICD-10-CM

## 2017-03-19 DIAGNOSIS — Z79899 Other long term (current) drug therapy: Secondary | ICD-10-CM | POA: Diagnosis not present

## 2017-03-19 DIAGNOSIS — D509 Iron deficiency anemia, unspecified: Secondary | ICD-10-CM | POA: Diagnosis not present

## 2017-03-19 DIAGNOSIS — K56609 Unspecified intestinal obstruction, unspecified as to partial versus complete obstruction: Secondary | ICD-10-CM | POA: Diagnosis not present

## 2017-03-19 DIAGNOSIS — K219 Gastro-esophageal reflux disease without esophagitis: Secondary | ICD-10-CM | POA: Diagnosis not present

## 2017-03-19 DIAGNOSIS — D649 Anemia, unspecified: Secondary | ICD-10-CM

## 2017-03-19 DIAGNOSIS — R5383 Other fatigue: Secondary | ICD-10-CM | POA: Diagnosis not present

## 2017-03-19 DIAGNOSIS — K921 Melena: Secondary | ICD-10-CM | POA: Diagnosis not present

## 2017-03-19 MED ORDER — SODIUM CHLORIDE 0.9 % IV SOLN
INTRAVENOUS | Status: DC
  Administered 2017-03-19: 15:00:00 via INTRAVENOUS
  Filled 2017-03-19: qty 1000

## 2017-03-19 MED ORDER — IRON SUCROSE 20 MG/ML IV SOLN
200.0000 mg | Freq: Once | INTRAVENOUS | Status: AC
Start: 2017-03-19 — End: 2017-03-19
  Administered 2017-03-19: 200 mg via INTRAVENOUS
  Filled 2017-03-19: qty 10

## 2017-03-26 DIAGNOSIS — M6281 Muscle weakness (generalized): Secondary | ICD-10-CM | POA: Diagnosis not present

## 2017-03-26 DIAGNOSIS — I110 Hypertensive heart disease with heart failure: Secondary | ICD-10-CM | POA: Diagnosis not present

## 2017-03-26 DIAGNOSIS — M810 Age-related osteoporosis without current pathological fracture: Secondary | ICD-10-CM | POA: Diagnosis not present

## 2017-03-26 DIAGNOSIS — I482 Chronic atrial fibrillation: Secondary | ICD-10-CM | POA: Diagnosis not present

## 2017-03-26 DIAGNOSIS — I2510 Atherosclerotic heart disease of native coronary artery without angina pectoris: Secondary | ICD-10-CM | POA: Diagnosis not present

## 2017-03-26 DIAGNOSIS — I5032 Chronic diastolic (congestive) heart failure: Secondary | ICD-10-CM | POA: Diagnosis not present

## 2017-03-26 DIAGNOSIS — F329 Major depressive disorder, single episode, unspecified: Secondary | ICD-10-CM | POA: Diagnosis not present

## 2017-03-26 DIAGNOSIS — D500 Iron deficiency anemia secondary to blood loss (chronic): Secondary | ICD-10-CM | POA: Diagnosis not present

## 2017-03-26 DIAGNOSIS — F0390 Unspecified dementia without behavioral disturbance: Secondary | ICD-10-CM | POA: Diagnosis not present

## 2017-03-28 DIAGNOSIS — I482 Chronic atrial fibrillation: Secondary | ICD-10-CM | POA: Diagnosis not present

## 2017-03-28 DIAGNOSIS — D500 Iron deficiency anemia secondary to blood loss (chronic): Secondary | ICD-10-CM | POA: Diagnosis not present

## 2017-03-28 DIAGNOSIS — I110 Hypertensive heart disease with heart failure: Secondary | ICD-10-CM | POA: Diagnosis not present

## 2017-03-28 DIAGNOSIS — I5032 Chronic diastolic (congestive) heart failure: Secondary | ICD-10-CM | POA: Diagnosis not present

## 2017-03-28 DIAGNOSIS — M810 Age-related osteoporosis without current pathological fracture: Secondary | ICD-10-CM | POA: Diagnosis not present

## 2017-03-28 DIAGNOSIS — F329 Major depressive disorder, single episode, unspecified: Secondary | ICD-10-CM | POA: Diagnosis not present

## 2017-03-28 DIAGNOSIS — M6281 Muscle weakness (generalized): Secondary | ICD-10-CM | POA: Diagnosis not present

## 2017-03-28 DIAGNOSIS — F0390 Unspecified dementia without behavioral disturbance: Secondary | ICD-10-CM | POA: Diagnosis not present

## 2017-03-28 DIAGNOSIS — I2510 Atherosclerotic heart disease of native coronary artery without angina pectoris: Secondary | ICD-10-CM | POA: Diagnosis not present

## 2017-04-01 DIAGNOSIS — I110 Hypertensive heart disease with heart failure: Secondary | ICD-10-CM | POA: Diagnosis not present

## 2017-04-01 DIAGNOSIS — I2510 Atherosclerotic heart disease of native coronary artery without angina pectoris: Secondary | ICD-10-CM | POA: Diagnosis not present

## 2017-04-01 DIAGNOSIS — I5032 Chronic diastolic (congestive) heart failure: Secondary | ICD-10-CM | POA: Diagnosis not present

## 2017-04-01 DIAGNOSIS — I482 Chronic atrial fibrillation: Secondary | ICD-10-CM | POA: Diagnosis not present

## 2017-04-01 DIAGNOSIS — D500 Iron deficiency anemia secondary to blood loss (chronic): Secondary | ICD-10-CM | POA: Diagnosis not present

## 2017-04-01 DIAGNOSIS — F329 Major depressive disorder, single episode, unspecified: Secondary | ICD-10-CM | POA: Diagnosis not present

## 2017-04-01 DIAGNOSIS — F0390 Unspecified dementia without behavioral disturbance: Secondary | ICD-10-CM | POA: Diagnosis not present

## 2017-04-01 DIAGNOSIS — M6281 Muscle weakness (generalized): Secondary | ICD-10-CM | POA: Diagnosis not present

## 2017-04-01 DIAGNOSIS — M810 Age-related osteoporosis without current pathological fracture: Secondary | ICD-10-CM | POA: Diagnosis not present

## 2017-04-03 DIAGNOSIS — M6281 Muscle weakness (generalized): Secondary | ICD-10-CM | POA: Diagnosis not present

## 2017-04-03 DIAGNOSIS — F329 Major depressive disorder, single episode, unspecified: Secondary | ICD-10-CM | POA: Diagnosis not present

## 2017-04-03 DIAGNOSIS — M810 Age-related osteoporosis without current pathological fracture: Secondary | ICD-10-CM | POA: Diagnosis not present

## 2017-04-03 DIAGNOSIS — I482 Chronic atrial fibrillation: Secondary | ICD-10-CM | POA: Diagnosis not present

## 2017-04-03 DIAGNOSIS — I110 Hypertensive heart disease with heart failure: Secondary | ICD-10-CM | POA: Diagnosis not present

## 2017-04-03 DIAGNOSIS — I5032 Chronic diastolic (congestive) heart failure: Secondary | ICD-10-CM | POA: Diagnosis not present

## 2017-04-03 DIAGNOSIS — D500 Iron deficiency anemia secondary to blood loss (chronic): Secondary | ICD-10-CM | POA: Diagnosis not present

## 2017-04-03 DIAGNOSIS — I2510 Atherosclerotic heart disease of native coronary artery without angina pectoris: Secondary | ICD-10-CM | POA: Diagnosis not present

## 2017-04-03 DIAGNOSIS — F0390 Unspecified dementia without behavioral disturbance: Secondary | ICD-10-CM | POA: Diagnosis not present

## 2017-04-09 ENCOUNTER — Ambulatory Visit (INDEPENDENT_AMBULATORY_CARE_PROVIDER_SITE_OTHER): Payer: Medicare HMO | Admitting: *Deleted

## 2017-04-09 DIAGNOSIS — M6281 Muscle weakness (generalized): Secondary | ICD-10-CM | POA: Diagnosis not present

## 2017-04-09 DIAGNOSIS — I110 Hypertensive heart disease with heart failure: Secondary | ICD-10-CM | POA: Diagnosis not present

## 2017-04-09 DIAGNOSIS — I495 Sick sinus syndrome: Secondary | ICD-10-CM | POA: Diagnosis not present

## 2017-04-09 DIAGNOSIS — I482 Chronic atrial fibrillation: Secondary | ICD-10-CM | POA: Diagnosis not present

## 2017-04-09 DIAGNOSIS — H353221 Exudative age-related macular degeneration, left eye, with active choroidal neovascularization: Secondary | ICD-10-CM | POA: Diagnosis not present

## 2017-04-09 DIAGNOSIS — I5032 Chronic diastolic (congestive) heart failure: Secondary | ICD-10-CM | POA: Diagnosis not present

## 2017-04-09 DIAGNOSIS — M810 Age-related osteoporosis without current pathological fracture: Secondary | ICD-10-CM | POA: Diagnosis not present

## 2017-04-09 DIAGNOSIS — I2510 Atherosclerotic heart disease of native coronary artery without angina pectoris: Secondary | ICD-10-CM | POA: Diagnosis not present

## 2017-04-09 DIAGNOSIS — D500 Iron deficiency anemia secondary to blood loss (chronic): Secondary | ICD-10-CM | POA: Diagnosis not present

## 2017-04-09 DIAGNOSIS — F0390 Unspecified dementia without behavioral disturbance: Secondary | ICD-10-CM | POA: Diagnosis not present

## 2017-04-09 DIAGNOSIS — F329 Major depressive disorder, single episode, unspecified: Secondary | ICD-10-CM | POA: Diagnosis not present

## 2017-04-09 NOTE — Progress Notes (Signed)
Remote pacemaker transmission.   

## 2017-04-10 LAB — CUP PACEART REMOTE DEVICE CHECK
Battery Impedance: 152 Ohm
Battery Remaining Longevity: 123 mo
Battery Voltage: 2.79 V
Brady Statistic AP VP Percent: 0 %
Brady Statistic AS VP Percent: 0 %
Implantable Lead Location: 753859
Implantable Lead Model: 5076
Implantable Pulse Generator Implant Date: 20170329
Lead Channel Pacing Threshold Amplitude: 1.125 V
Lead Channel Pacing Threshold Pulse Width: 0.4 ms
Lead Channel Setting Pacing Amplitude: 3.25 V
Lead Channel Setting Pacing Pulse Width: 0.4 ms
MDC IDC LEAD IMPLANT DT: 20080813
MDC IDC LEAD IMPLANT DT: 20080813
MDC IDC LEAD LOCATION: 753860
MDC IDC MSMT LEADCHNL RA IMPEDANCE VALUE: 370 Ohm
MDC IDC MSMT LEADCHNL RV IMPEDANCE VALUE: 461 Ohm
MDC IDC MSMT LEADCHNL RV PACING THRESHOLD AMPLITUDE: 1.625 V
MDC IDC MSMT LEADCHNL RV PACING THRESHOLD PULSEWIDTH: 0.4 ms
MDC IDC MSMT LEADCHNL RV SENSING INTR AMPL: 5.6 mV
MDC IDC SESS DTM: 20181030170847
MDC IDC SET LEADCHNL RA PACING AMPLITUDE: 2.25 V
MDC IDC SET LEADCHNL RV SENSING SENSITIVITY: 2.8 mV
MDC IDC STAT BRADY AP VS PERCENT: 97 %
MDC IDC STAT BRADY AS VS PERCENT: 3 %

## 2017-04-11 DIAGNOSIS — I2510 Atherosclerotic heart disease of native coronary artery without angina pectoris: Secondary | ICD-10-CM | POA: Diagnosis not present

## 2017-04-11 DIAGNOSIS — M6281 Muscle weakness (generalized): Secondary | ICD-10-CM | POA: Diagnosis not present

## 2017-04-11 DIAGNOSIS — I110 Hypertensive heart disease with heart failure: Secondary | ICD-10-CM | POA: Diagnosis not present

## 2017-04-11 DIAGNOSIS — I482 Chronic atrial fibrillation: Secondary | ICD-10-CM | POA: Diagnosis not present

## 2017-04-11 DIAGNOSIS — D500 Iron deficiency anemia secondary to blood loss (chronic): Secondary | ICD-10-CM | POA: Diagnosis not present

## 2017-04-11 DIAGNOSIS — F329 Major depressive disorder, single episode, unspecified: Secondary | ICD-10-CM | POA: Diagnosis not present

## 2017-04-11 DIAGNOSIS — I5032 Chronic diastolic (congestive) heart failure: Secondary | ICD-10-CM | POA: Diagnosis not present

## 2017-04-11 DIAGNOSIS — F0390 Unspecified dementia without behavioral disturbance: Secondary | ICD-10-CM | POA: Diagnosis not present

## 2017-04-11 DIAGNOSIS — M810 Age-related osteoporosis without current pathological fracture: Secondary | ICD-10-CM | POA: Diagnosis not present

## 2017-04-14 NOTE — Progress Notes (Signed)
Cardiology Office Note  Date:  04/16/2017   ID:  Kathryn Short, DOB [birth year], MRN [id number]  PCP:  Tracie Harrier, MD   Chief Complaint  Patient presents with  . other    6 week follow up. Meds reviewed by the pt. verbally. Pt. c/o shortness of breath with very little swelling in legs.     HPI:  Kathryn Short is an 83 year old woman with  paroxysmal atrial fibrillation (last episode in March 9528),  diastolic CHF on a  diuretic daily,  labile hypertension,  sick sinus syndrome, pacemaker placement followed by Dr. Caryl Comes,  in hospital [facility] on 02/16/2012 in atrial fibrillation.  open cholecystectomy, partial colon resection and colostomy, appendectomy, hysterectomy, oophorectomy, GYN malignancy treated with radiation in the 1950s bowel obstruction December 2015 She presents for routine follow-up of her blood pressure, diastolic CHF and atrial fibrillation  In follow-up today she reports that she is feeling better Potassium elevated, 5.6, primary care held her potassium supplement She was not taking Aldactone HCT 31 1 month ago HCT 14 yesterday She did receive iron supplements Currently not on anticoagulation, not on aspirin  Leg swelling has improved, slightly worse on the left than the right Continued gait instability working with therapy,   Pacer download reviewed showing 1.6% in atrial fibrillation longest was 17 hours  EKG personally reviewed by myself on todays visit Shows paced rhythm at 59 bpm nonspecific ST abnormality  Other past medical history reviewed hospitalization for GI bleed 01/02/2017 Blood in the toilet, went to the hospital Was anemic Required transfusion 1. Hemoglobin down to 7.5 known soft tissue mass in vaginal area  eliquis stopped , started on asa 325 at discharge (?)  Asa changed to eliquis 2.5 BID as an outpt  Readmission for GI bleed 02/28/2017 Weak, HGB 7.6 PRBC transfusion, up to 8.8 at the time of discharge Again was discharged on  aspirin As an outpatient we recommended she hold the aspirin given no stroke benefit from her atrial fibrillation  In follow-up today she has slight worsening of her leg swelling, general malaise, fatigue Reports having a cough that comes and goes  less communicative, likely from hard of hearing Has been taking torsemide 20 g daily Recently received iron infusion She is having bad diarrhea every other day after she takes lactulose. Feels wiped out  She is not taking clonidine and losartan, blood pressure has been low  EKG personally reviewed by myself on todays visit Shows paced rhythm 61 bpm no significant ST or T-wave changes  Other past medical history reviewed  On previous office visit she reported having symptoms of Vomiting, abdominal cramps Went to the hospital and had CT ABD:  aortic atherosclerotic Questionable mass but was seen by OB/GYN and was cleared  Lab work January 2018 creatinine 1.3 Most recent creatinine 1.2 Chronic urinary tract infection TSH 11  EKG on today's visit shows paced rhythm, atrial paced, 61 bpm   Other past medical history clinic 8/9, Noted to have increased atrial fibrillation burden on pacer download January 03 2016 recommended she increase her amiodarone, is on anticoagulation Low BP, head fullness Recommended that she restart her Aldactone as blood pressure improves  Seen by Dr. Caryl Comes August 15, recommended she increase her torsemide for several days Seen by primary care 8/15: urine UTI No Urine cx available to date BMP shows creatinine continues to run higher consistent with prerenal state Taking torsemide daily with 1.5 torsemide on weekends, Aldactone added every other day  Previous CT scan  done by primary care showing small to moderate right pleural effusion, evidence of cirrhosis Mention of cirrhosis back on CT scan in 2015 Mildly elevated LFTs over the past year  in the hospital 05/25/2014, with small bowel obstruction. She was  placed on bowel rest, IV fluids and she passed her bowels. She did not require surgery  Echocardiogram done in the hospital 05/25/2014 showing ejection fraction greater than 55%, moderate pulmonary hypertension, mild to moderate TR  Earlier in 2015, she had atrial tachycardia and pacer was tracking the atrial rhythm, Her device was reprogrammed in the office for DDI  She was continued on her beta blocker and amiodarone and rhythm broke, converting back to normal sinus rhythm. Pacemaker has been since changed back to her prior settings  She is reluctant to use a walker, still uses a cane. Anticoagulation discussed previouslywith the patient and her daughter. They are not interested. They preferred to stay on aspirin  history of falls with trauma requiring stitches. None recently Patient and daughter are afraid of her fall risk, other complications from anticoagulation. Currently not on anticoagulation at their request. They are aware of risks and benefits associated with arrhythmia and stroke.  Previous lab work; Total cholesterol 189, LDL 104, hematocrit 38, creatinine 0.8  previous visits to the emergency room with chest pain, dizziness, malaise. She had a problem on isosorbide in the past. Feelings of insomnia, and anxious feeling, chest pain episodes at night that wake her from sleep, weakness, nausea, dizziness. She has obstructive sleep apnea and does not use her BiPAP at nighttime  Echocardiogram in the hospital showed ejection fraction 50-55%, moderate LVH, mild to moderate TR, right ventricular systolic pressure 40 mm of mercury, mild to moderate aortic valve insufficiency  PMH:   has a past medical history of Atrial fibrillation ([place]), Bowel obstruction ([place]), CHF (congestive heart failure) ([place]), Fall, GERD (gastroesophageal reflux disease), Hypertension, Hypothyroidism, Iron deficiency anemia due to chronic blood loss (02/05/2017), Iron deficiency anemia due to chronic  blood loss (02/05/2017), Melanoma ([place]), OSA (obstructive sleep apnea), and Uterine cancer ([place]).  PSH:    Past Surgical History:  Procedure Laterality Date  . CHOLECYSTECTOMY    . COLOSTOMY     For perforation  . Cytoscopy    . INSERT / REPLACE / REMOVE PACEMAKER     Implantation-[employer]  . SKIN BIOPSY     face  . TOTAL ABDOMINAL HYSTERECTOMY W/ BILATERAL SALPINGOOPHORECTOMY      Current Outpatient Medications  Medication Sig Dispense Refill  . acetaminophen (TYLENOL) 500 MG tablet Take 1,000 mg by mouth daily as needed for mild pain or headache.     . albuterol (PROVENTIL HFA;VENTOLIN HFA) 108 (90 BASE) MCG/ACT inhaler Inhale 2 puffs into the lungs every 6 (six) hours as needed for wheezing or shortness of breath.     Marland Kitchen amiodarone (PACERONE) 200 MG tablet TAKE ONE (1) TABLET BY MOUTH EVERY DAY 90 tablet 3  . b complex vitamins tablet Take 1 tablet by mouth daily.    . Carboxymethylcellul-Glycerin (LUBRICATING EYE DROPS OP) Place 2 drops into both eyes 2 (two) times daily.    . clonazePAM (KLONOPIN) 0.5 MG tablet Take 0.5 mg by mouth 2 (two) times daily as needed for anxiety.    . cloNIDine (CATAPRES) 0.1 MG tablet Take 1 tablet (0.1 mg total) by mouth as needed. 60 tablet 6  . Docusate Calcium (STOOL SOFTENER PO) Take at bedtime by mouth.    . lactulose (CHRONULAC) 10 GM/15ML solution Take 45  mLs (30 g total) by mouth every other day. 240 mL 0  . levothyroxine (SYNTHROID, LEVOTHROID) 75 MCG tablet Take 75 mcg by mouth daily.    Marland Kitchen losartan (COZAAR) 100 MG tablet Take 1 tablet (100 mg total) by mouth daily as needed. 30 tablet 6  . metoprolol (LOPRESSOR) 50 MG tablet Take 1 tablet (50 mg total) by mouth 2 (two) times daily. (Patient taking differently: Take 50 mg by mouth daily. ) 180 tablet 3  . montelukast (SINGULAIR) 10 MG tablet Take 10 mg by mouth at bedtime.    . Multiple Vitamin (MULTIVITAMIN) tablet Take 1 tablet by mouth daily.    . Multiple Vitamins-Minerals (PRESERVISION  AREDS 2 PO) Take by mouth 2 (two) times daily.    . nitroGLYCERIN (NITROSTAT) 0.4 MG SL tablet Place 1 tablet (0.4 mg total) under the tongue every 5 (five) minutes as needed for chest pain. 25 tablet 6  . sertraline (ZOLOFT) 50 MG tablet Take 50 mg by mouth daily.    Marland Kitchen torsemide (DEMADEX) 20 MG tablet Take 1 tablet (20 mg total) by mouth 2 (two) times daily as needed. 180 tablet 3  . Vitamin D, Ergocalciferol, (DRISDOL) 50000 units CAPS capsule Take 50,000 Units by mouth every 7 (seven) days.     No current facility-administered medications for this visit.      Allergies:   Ace inhibitors; Ambien [zolpidem tartrate]; Bactrim [sulfamethoxazole-trimethoprim]; Digoxin and related; Hctz [hydrochlorothiazide]; and Lunesta [eszopiclone]   Social History:  The patient  reports that  has never smoked. she has never used smokeless tobacco. She reports that she does not drink alcohol or use drugs.   Family History:   Family history is unknown by patient.    Review of Systems: Review of Systems  Constitutional: Negative.   Respiratory: Negative.   Cardiovascular: Positive for leg swelling.  Gastrointestinal: Negative.   Musculoskeletal: Negative.   Psychiatric/Behavioral: Negative.   All other systems reviewed and are negative.    PHYSICAL EXAM: VS:  BP (!) 110/52 (BP Location: Left Arm, Patient Position: Sitting, Cuff Size: Normal)   Pulse (!) 59   Ht 5\' 2"  (1.575 m)   Wt 136 lb (61.7 kg)   BMI 24.87 kg/m  , BMI Body mass index is 24.87 kg/m. GEN: well developed, in no acute distress , Hard of hearing, presents in a wheelchair  Appears pale HEENT: normal  Neck: no JVD, carotid bruits, or masses Cardiac: RRR; no murmurs, rubs, or gallops, Trace pitting edema left lower extremity Respiratory:  clear to auscultation bilaterally, normal work of breathing GI: soft, nontender, nondistended, + BS MS: no deformity or atrophy  Skin: warm and dry, no rash Neuro:  Strength and sensation are  intact Psych: euthymic mood, full affect    Recent Labs: 01/02/2017: TSH 9.127 03/01/2017: ALT 49; BUN 29; Creatinine, Ser 1.26; Potassium 3.5; Sodium 138 03/13/2017: Hemoglobin 10.1; Platelets 202    Lipid Panel No results found for: CHOL, HDL, LDLCALC, TRIG    Wt Readings from Last 3 Encounters:  04/16/17 136 lb (61.7 kg)  03/13/17 139 lb (63 kg)  03/08/17 142 lb 3 oz (64.5 kg)       ASSESSMENT AND PLAN:  Essential hypertension - Plan: EKG 12-Lead Blood pressure is well controlled on today's visit. No changes made to the medications.  Paroxysmal atrial fibrillation (HCC) - Plan: EKG 12-Lead Paced rhythm, On low-dose amiodarone Elevated LFTs, could potentially decrease dose of amiodarone or stop the medication if needed.   Chronotropic incompetence  with sinus node dysfunction  (HCC) - Plan: EKG 12-Lead Atrial paced rhythm, Managed by Dr. Caryl Comes 1.6% of the time in atrial fibrillation, asymptomatic  GI bleed  hold aspirin and Eliquis  Chronic diastolic CHF (congestive heart failure) ([place]) - Plan: EKG 12-Lead Torsemide 20 mg daily,  Need repeat potassium check to rule out hemolysis Leg edema better with improved anemia  Anemia Recent iron infusion Numbers much improved   Total encounter time more than 25 minutes  Greater than 50% was spent in counseling and coordination of care with the patient   Disposition:   F/U  2 mo   No orders of the defined types were placed in this encounter.    Signed, Esmond Plants, M.D., Ph.D. 04/16/2017  [place], [place]

## 2017-04-15 DIAGNOSIS — I2510 Atherosclerotic heart disease of native coronary artery without angina pectoris: Secondary | ICD-10-CM | POA: Diagnosis not present

## 2017-04-15 DIAGNOSIS — R7309 Other abnormal glucose: Secondary | ICD-10-CM | POA: Diagnosis not present

## 2017-04-15 DIAGNOSIS — I5032 Chronic diastolic (congestive) heart failure: Secondary | ICD-10-CM | POA: Diagnosis not present

## 2017-04-15 DIAGNOSIS — M6281 Muscle weakness (generalized): Secondary | ICD-10-CM | POA: Diagnosis not present

## 2017-04-15 DIAGNOSIS — D500 Iron deficiency anemia secondary to blood loss (chronic): Secondary | ICD-10-CM | POA: Diagnosis not present

## 2017-04-15 DIAGNOSIS — I110 Hypertensive heart disease with heart failure: Secondary | ICD-10-CM | POA: Diagnosis not present

## 2017-04-15 DIAGNOSIS — Z87440 Personal history of urinary (tract) infections: Secondary | ICD-10-CM | POA: Diagnosis not present

## 2017-04-15 DIAGNOSIS — M810 Age-related osteoporosis without current pathological fracture: Secondary | ICD-10-CM | POA: Diagnosis not present

## 2017-04-15 DIAGNOSIS — I10 Essential (primary) hypertension: Secondary | ICD-10-CM | POA: Diagnosis not present

## 2017-04-15 DIAGNOSIS — E079 Disorder of thyroid, unspecified: Secondary | ICD-10-CM | POA: Diagnosis not present

## 2017-04-15 DIAGNOSIS — E785 Hyperlipidemia, unspecified: Secondary | ICD-10-CM | POA: Diagnosis not present

## 2017-04-15 DIAGNOSIS — K7460 Unspecified cirrhosis of liver: Secondary | ICD-10-CM | POA: Diagnosis not present

## 2017-04-15 DIAGNOSIS — I482 Chronic atrial fibrillation: Secondary | ICD-10-CM | POA: Diagnosis not present

## 2017-04-15 DIAGNOSIS — F329 Major depressive disorder, single episode, unspecified: Secondary | ICD-10-CM | POA: Diagnosis not present

## 2017-04-15 DIAGNOSIS — F0390 Unspecified dementia without behavioral disturbance: Secondary | ICD-10-CM | POA: Diagnosis not present

## 2017-04-16 ENCOUNTER — Ambulatory Visit: Payer: Medicare HMO | Admitting: Cardiovascular Disease

## 2017-04-16 ENCOUNTER — Encounter: Payer: Self-pay | Admitting: Cardiovascular Disease

## 2017-04-16 VITALS — BP 110/52 | HR 59 | Ht 62.0 in | Wt 136.0 lb

## 2017-04-16 DIAGNOSIS — I495 Sick sinus syndrome: Secondary | ICD-10-CM | POA: Diagnosis not present

## 2017-04-16 DIAGNOSIS — R531 Weakness: Secondary | ICD-10-CM | POA: Diagnosis not present

## 2017-04-16 DIAGNOSIS — K56609 Unspecified intestinal obstruction, unspecified as to partial versus complete obstruction: Secondary | ICD-10-CM | POA: Diagnosis not present

## 2017-04-16 DIAGNOSIS — R079 Chest pain, unspecified: Secondary | ICD-10-CM

## 2017-04-16 DIAGNOSIS — R600 Localized edema: Secondary | ICD-10-CM | POA: Diagnosis not present

## 2017-04-16 DIAGNOSIS — I5032 Chronic diastolic (congestive) heart failure: Secondary | ICD-10-CM

## 2017-04-16 DIAGNOSIS — I481 Persistent atrial fibrillation: Secondary | ICD-10-CM | POA: Diagnosis not present

## 2017-04-16 DIAGNOSIS — R296 Repeated falls: Secondary | ICD-10-CM

## 2017-04-16 DIAGNOSIS — I10 Essential (primary) hypertension: Secondary | ICD-10-CM | POA: Diagnosis not present

## 2017-04-16 DIAGNOSIS — I471 Supraventricular tachycardia: Secondary | ICD-10-CM | POA: Diagnosis not present

## 2017-04-16 DIAGNOSIS — M7989 Other specified soft tissue disorders: Secondary | ICD-10-CM | POA: Diagnosis not present

## 2017-04-16 DIAGNOSIS — I4819 Other persistent atrial fibrillation: Secondary | ICD-10-CM

## 2017-04-16 NOTE — Patient Instructions (Addendum)
Medication Instructions:   Ask Dr. Ginette Pitman if he will recheck potassium level Restart potassium pill?  Ask Dr. Lemmie Evens about amiodarone Stop or 1/2 pill or stay on whole?  Labwork:  No new labs needed  Testing/Procedures:  No further testing at this time   Follow-Up: It was a pleasure seeing you in the office today. Please call us if you have new issues that need to be addressed before your next appt.  [phone]  Your physician wants you to follow-up in: 6 weeks  If you need a refill on your cardiac medications before your next appointment, please call your pharmacy.

## 2017-04-17 ENCOUNTER — Encounter: Payer: Self-pay | Admitting: Cardiology

## 2017-04-17 NOTE — Addendum Note (Signed)
Addended by: Anselm Pancoast on: 04/17/2017 03:52 PM   Modules accepted: Orders

## 2017-04-18 DIAGNOSIS — I5032 Chronic diastolic (congestive) heart failure: Secondary | ICD-10-CM | POA: Diagnosis not present

## 2017-04-18 DIAGNOSIS — F0390 Unspecified dementia without behavioral disturbance: Secondary | ICD-10-CM | POA: Diagnosis not present

## 2017-04-18 DIAGNOSIS — M6281 Muscle weakness (generalized): Secondary | ICD-10-CM | POA: Diagnosis not present

## 2017-04-18 DIAGNOSIS — I110 Hypertensive heart disease with heart failure: Secondary | ICD-10-CM | POA: Diagnosis not present

## 2017-04-18 DIAGNOSIS — I2510 Atherosclerotic heart disease of native coronary artery without angina pectoris: Secondary | ICD-10-CM | POA: Diagnosis not present

## 2017-04-18 DIAGNOSIS — I482 Chronic atrial fibrillation: Secondary | ICD-10-CM | POA: Diagnosis not present

## 2017-04-18 DIAGNOSIS — D500 Iron deficiency anemia secondary to blood loss (chronic): Secondary | ICD-10-CM | POA: Diagnosis not present

## 2017-04-18 DIAGNOSIS — F329 Major depressive disorder, single episode, unspecified: Secondary | ICD-10-CM | POA: Diagnosis not present

## 2017-04-18 DIAGNOSIS — M810 Age-related osteoporosis without current pathological fracture: Secondary | ICD-10-CM | POA: Diagnosis not present

## 2017-04-19 ENCOUNTER — Ambulatory Visit
Admission: RE | Admit: 2017-04-19 | Discharge: 2017-04-19 | Disposition: A | Discharge status: Home / Self Care | Payer: Medicare HMO | Source: Ambulatory Visit | Attending: Internal Medicine | Admitting: Internal Medicine

## 2017-04-19 DIAGNOSIS — Z1231 Encounter for screening mammogram for malignant neoplasm of breast: Secondary | ICD-10-CM | POA: Diagnosis not present

## 2017-04-22 DIAGNOSIS — Z87440 Personal history of urinary (tract) infections: Secondary | ICD-10-CM | POA: Diagnosis not present

## 2017-04-22 DIAGNOSIS — I10 Essential (primary) hypertension: Secondary | ICD-10-CM | POA: Diagnosis not present

## 2017-04-22 DIAGNOSIS — K7460 Unspecified cirrhosis of liver: Secondary | ICD-10-CM | POA: Diagnosis not present

## 2017-04-22 DIAGNOSIS — I480 Paroxysmal atrial fibrillation: Secondary | ICD-10-CM | POA: Diagnosis not present

## 2017-04-22 DIAGNOSIS — E785 Hyperlipidemia, unspecified: Secondary | ICD-10-CM | POA: Diagnosis not present

## 2017-04-23 DIAGNOSIS — D500 Iron deficiency anemia secondary to blood loss (chronic): Secondary | ICD-10-CM | POA: Diagnosis not present

## 2017-04-23 DIAGNOSIS — F0390 Unspecified dementia without behavioral disturbance: Secondary | ICD-10-CM | POA: Diagnosis not present

## 2017-04-23 DIAGNOSIS — I2510 Atherosclerotic heart disease of native coronary artery without angina pectoris: Secondary | ICD-10-CM | POA: Diagnosis not present

## 2017-04-23 DIAGNOSIS — H353221 Exudative age-related macular degeneration, left eye, with active choroidal neovascularization: Secondary | ICD-10-CM | POA: Diagnosis not present

## 2017-04-23 DIAGNOSIS — H353211 Exudative age-related macular degeneration, right eye, with active choroidal neovascularization: Secondary | ICD-10-CM | POA: Diagnosis not present

## 2017-04-23 DIAGNOSIS — M6281 Muscle weakness (generalized): Secondary | ICD-10-CM | POA: Diagnosis not present

## 2017-04-23 DIAGNOSIS — F329 Major depressive disorder, single episode, unspecified: Secondary | ICD-10-CM | POA: Diagnosis not present

## 2017-04-23 DIAGNOSIS — I482 Chronic atrial fibrillation: Secondary | ICD-10-CM | POA: Diagnosis not present

## 2017-04-23 DIAGNOSIS — I5032 Chronic diastolic (congestive) heart failure: Secondary | ICD-10-CM | POA: Diagnosis not present

## 2017-04-23 DIAGNOSIS — M810 Age-related osteoporosis without current pathological fracture: Secondary | ICD-10-CM | POA: Diagnosis not present

## 2017-04-23 DIAGNOSIS — I110 Hypertensive heart disease with heart failure: Secondary | ICD-10-CM | POA: Diagnosis not present

## 2017-04-25 DIAGNOSIS — F329 Major depressive disorder, single episode, unspecified: Secondary | ICD-10-CM | POA: Diagnosis not present

## 2017-04-25 DIAGNOSIS — I5032 Chronic diastolic (congestive) heart failure: Secondary | ICD-10-CM | POA: Diagnosis not present

## 2017-04-25 DIAGNOSIS — I110 Hypertensive heart disease with heart failure: Secondary | ICD-10-CM | POA: Diagnosis not present

## 2017-04-25 DIAGNOSIS — F0390 Unspecified dementia without behavioral disturbance: Secondary | ICD-10-CM | POA: Diagnosis not present

## 2017-04-25 DIAGNOSIS — I2510 Atherosclerotic heart disease of native coronary artery without angina pectoris: Secondary | ICD-10-CM | POA: Diagnosis not present

## 2017-04-25 DIAGNOSIS — M6281 Muscle weakness (generalized): Secondary | ICD-10-CM | POA: Diagnosis not present

## 2017-04-25 DIAGNOSIS — I482 Chronic atrial fibrillation: Secondary | ICD-10-CM | POA: Diagnosis not present

## 2017-04-25 DIAGNOSIS — D500 Iron deficiency anemia secondary to blood loss (chronic): Secondary | ICD-10-CM | POA: Diagnosis not present

## 2017-04-25 DIAGNOSIS — M810 Age-related osteoporosis without current pathological fracture: Secondary | ICD-10-CM | POA: Diagnosis not present

## 2017-04-30 DIAGNOSIS — I482 Chronic atrial fibrillation: Secondary | ICD-10-CM | POA: Diagnosis not present

## 2017-04-30 DIAGNOSIS — M810 Age-related osteoporosis without current pathological fracture: Secondary | ICD-10-CM | POA: Diagnosis not present

## 2017-04-30 DIAGNOSIS — F0390 Unspecified dementia without behavioral disturbance: Secondary | ICD-10-CM | POA: Diagnosis not present

## 2017-04-30 DIAGNOSIS — M6281 Muscle weakness (generalized): Secondary | ICD-10-CM | POA: Diagnosis not present

## 2017-04-30 DIAGNOSIS — I110 Hypertensive heart disease with heart failure: Secondary | ICD-10-CM | POA: Diagnosis not present

## 2017-04-30 DIAGNOSIS — F329 Major depressive disorder, single episode, unspecified: Secondary | ICD-10-CM | POA: Diagnosis not present

## 2017-04-30 DIAGNOSIS — I2510 Atherosclerotic heart disease of native coronary artery without angina pectoris: Secondary | ICD-10-CM | POA: Diagnosis not present

## 2017-04-30 DIAGNOSIS — D500 Iron deficiency anemia secondary to blood loss (chronic): Secondary | ICD-10-CM | POA: Diagnosis not present

## 2017-04-30 DIAGNOSIS — I5032 Chronic diastolic (congestive) heart failure: Secondary | ICD-10-CM | POA: Diagnosis not present

## 2017-05-07 DIAGNOSIS — M810 Age-related osteoporosis without current pathological fracture: Secondary | ICD-10-CM | POA: Diagnosis not present

## 2017-05-07 DIAGNOSIS — M6281 Muscle weakness (generalized): Secondary | ICD-10-CM | POA: Diagnosis not present

## 2017-05-07 DIAGNOSIS — I110 Hypertensive heart disease with heart failure: Secondary | ICD-10-CM | POA: Diagnosis not present

## 2017-05-07 DIAGNOSIS — D500 Iron deficiency anemia secondary to blood loss (chronic): Secondary | ICD-10-CM | POA: Diagnosis not present

## 2017-05-07 DIAGNOSIS — F329 Major depressive disorder, single episode, unspecified: Secondary | ICD-10-CM | POA: Diagnosis not present

## 2017-05-07 DIAGNOSIS — I2510 Atherosclerotic heart disease of native coronary artery without angina pectoris: Secondary | ICD-10-CM | POA: Diagnosis not present

## 2017-05-07 DIAGNOSIS — I482 Chronic atrial fibrillation: Secondary | ICD-10-CM | POA: Diagnosis not present

## 2017-05-07 DIAGNOSIS — I5032 Chronic diastolic (congestive) heart failure: Secondary | ICD-10-CM | POA: Diagnosis not present

## 2017-05-07 DIAGNOSIS — F0390 Unspecified dementia without behavioral disturbance: Secondary | ICD-10-CM | POA: Diagnosis not present

## 2017-05-09 DIAGNOSIS — M6281 Muscle weakness (generalized): Secondary | ICD-10-CM | POA: Diagnosis not present

## 2017-05-09 DIAGNOSIS — M810 Age-related osteoporosis without current pathological fracture: Secondary | ICD-10-CM | POA: Diagnosis not present

## 2017-05-09 DIAGNOSIS — D500 Iron deficiency anemia secondary to blood loss (chronic): Secondary | ICD-10-CM | POA: Diagnosis not present

## 2017-05-09 DIAGNOSIS — F0390 Unspecified dementia without behavioral disturbance: Secondary | ICD-10-CM | POA: Diagnosis not present

## 2017-05-09 DIAGNOSIS — I2510 Atherosclerotic heart disease of native coronary artery without angina pectoris: Secondary | ICD-10-CM | POA: Diagnosis not present

## 2017-05-09 DIAGNOSIS — F329 Major depressive disorder, single episode, unspecified: Secondary | ICD-10-CM | POA: Diagnosis not present

## 2017-05-09 DIAGNOSIS — I482 Chronic atrial fibrillation: Secondary | ICD-10-CM | POA: Diagnosis not present

## 2017-05-09 DIAGNOSIS — I110 Hypertensive heart disease with heart failure: Secondary | ICD-10-CM | POA: Diagnosis not present

## 2017-05-09 DIAGNOSIS — I5032 Chronic diastolic (congestive) heart failure: Secondary | ICD-10-CM | POA: Diagnosis not present

## 2017-05-12 NOTE — Progress Notes (Addendum)
[facility] Follow Up Note  Patient Care Team: Tracie Harrier, MD as PCP - General (Internal Medicine) Minna Merritts, MD as Consulting Physician (Cardiology)  REASON FOR VISIT Follow up for treatment of iron deficiency anemia  PERTINENT HEMATOLOGY ONCOLOGY HISTORY  1 Kathryn Short 83 y.o. female with PMH listed as below presents for follow up visit for management of chronic anemia.   Patient is accompanied by her daughter who provide most information. Patient is hard hearing and poor historian. Per daughter patient has been on Eliquis for a year due to A-Fib. Last Friday, daughter noticed black blood on patient's pad , and later little bit bright red blood mixed with her loose stools (took lactulose). Patient has been feeling more fatigued lately, SOB more than her baseline. Appetite is poor. Eliquis was held, last dose on 12/28/2016.  Patient has labs done earlier this month which showed Hb in 10s. 01/01/2017 when patient was seen by [facility], she had a repeat CBC done which showed acute drop of 2 grams of Hb. Hemoccult was positive with brown stool. A CT abdomen with oral contrast was ordered and scheduled at 3PM today. I was called by NP to see if we can evaluate patient urgently for the acute drop of anemia.  Per daughter patient has a remote history of uterine cancer in 1952, treated with RT.   10 December 2016 She has symptomatic anemia, acute on chronic CHF, hospitalized. She received blood transfusion during her hospitalization. Her Eliquis was initially discontinued. After she was evaluated by cardiology, the decision was made to restart on Eliquis 2.5 mg twice a day due to the high risk A. Fib. She was also seen by gynonc Dr. Leonides Schanz while she was in the hospital, vaginal exam showed a shortened vaginal, no blood or area of excoriation. Soft tissue mass was considered to be stable for years and likely the cause of her bleeding or a new recurrence.   3  She was  evaluated by cardiology outpatient in August 2018 and Elquis was restarted at 2,5mg  BID due to high risk A-Fib. Hemoglobin dropped again to 6.9 and she was sent to ER due to symptomatic anemia. She received 1 unit of PRBC and hemoglobin improved. She also received a dose of IV venofer inpatient. Off Eliquis.   4 IV iron treatments: she got She still feels fatigue and daughter reports that patient does not eat well. Her thyroid medication has been recently adjusted to 86mcg daily by Dr.Hande.    INTERVAL HISTORY;  Patient presents for follow up for iron deficiency anemia. s/p IV venofer 200mg  x 5 doses.  Patient presents for follow up. She was accompanied by her daughter. Per daughter, patient has felt better after her IV iron infusions. Recently she has UTI and just finished a course of antibiotics prescribed by Dr.Hande. Patient feels weak lately.  Daughter noticed that her lower extremities are no longer swelling (which usually swells due to CHF). She does not drink much fluid and does not have much urine volume.    Review of Systems  Constitutional: Positive for fatigue.  HENT:   Positive for hearing loss.   Eyes: Negative for eye problems.  Respiratory: Negative for chest tightness, cough and shortness of breath.   Cardiovascular: Negative for leg swelling.  Gastrointestinal: Negative for abdominal distention and abdominal pain.       Rectal bleeding  Endocrine: Negative for hot flashes.  Genitourinary: Negative for difficulty urinating and dysuria.  Less urine volume.  Recent UTI  Musculoskeletal: Negative for arthralgias.  Skin: Negative for itching.  Neurological: Negative for dizziness.  Hematological: Negative for adenopathy.  Psychiatric/Behavioral: The patient is not nervous/anxious.     MEDICAL HISTORY: Past Medical History:  Diagnosis Date  . Atrial fibrillation ([place])   . Bowel obstruction ([place])   . CHF (congestive heart failure) ([place])   . Fall   . GERD  (gastroesophageal reflux disease)   . Hypertension   . Hypothyroidism    Treated  . Iron deficiency anemia due to chronic blood loss 02/05/2017  . Iron deficiency anemia due to chronic blood loss 02/05/2017  . Melanoma ([place])    Resected from back  . OSA (obstructive sleep apnea)    On BiPAP  . Uterine cancer [facility])    s/p radiation therapy in 1952    SURGICAL HISTORY: Past Surgical History:  Procedure Laterality Date  . CHOLECYSTECTOMY    . COLOSTOMY     For perforation  . Cytoscopy    . EP IMPLANTABLE DEVICE N/A 09/07/2015   Procedure:  PPM Generator Changeout;  Surgeon: Deboraha Sprang, MD;  Location: [facility];  Service: Cardiovascular;  Laterality: N/A;  . INSERT / REPLACE / REMOVE PACEMAKER     Implantation-[employer]  . SKIN BIOPSY     face  . TOTAL ABDOMINAL HYSTERECTOMY W/ BILATERAL SALPINGOOPHORECTOMY      SOCIAL HISTORY: Social History   Socioeconomic History  . Marital status: Widowed    Spouse name: Not on file  . Number of children: 5  . Years of education: Not on file  . Highest education level: Not on file  Social Needs  . Financial resource strain: Not on file  . Food insecurity - worry: Not on file  . Food insecurity - inability: Not on file  . Transportation needs - medical: Not on file  . Transportation needs - non-medical: Not on file  Occupational History    Employer: RETIRED  Tobacco Use  . Smoking status: Never Smoker  . Smokeless tobacco: Never Used  . Tobacco comment: Tobacco use-no  Substance and Sexual Activity  . Alcohol use: No  . Drug use: No  . Sexual activity: Not on file  Other Topics Concern  . Not on file  Social History Narrative  . Not on file    FAMILY HISTORY Family History  Family history unknown: Yes    ALLERGIES:  is allergic to ace inhibitors; ambien [zolpidem tartrate]; bactrim [sulfamethoxazole-trimethoprim]; digoxin and related; hctz [hydrochlorothiazide]; and lunesta [eszopiclone].  MEDICATIONS:   Current Outpatient Medications  Medication Sig Dispense Refill  . acetaminophen (TYLENOL) 500 MG tablet Take 1,000 mg by mouth daily as needed for mild pain or headache.     . albuterol (PROVENTIL HFA;VENTOLIN HFA) 108 (90 BASE) MCG/ACT inhaler Inhale 2 puffs into the lungs every 6 (six) hours as needed for wheezing or shortness of breath.     Marland Kitchen amiodarone (PACERONE) 200 MG tablet TAKE ONE (1) TABLET BY MOUTH EVERY DAY 90 tablet 3  . b complex vitamins tablet Take 1 tablet by mouth daily.    . Carboxymethylcellul-Glycerin (LUBRICATING EYE DROPS OP) Place 2 drops into both eyes 2 (two) times daily.    . clonazePAM (KLONOPIN) 0.5 MG tablet Take 0.5 mg by mouth 2 (two) times daily as needed for anxiety.    . cloNIDine (CATAPRES) 0.1 MG tablet Take 1 tablet (0.1 mg total) by mouth as needed. 60 tablet 6  . Docusate Calcium (  STOOL SOFTENER PO) Take at bedtime by mouth.    . lactulose (CHRONULAC) 10 GM/15ML solution Take 45 mLs (30 g total) by mouth every other day. 240 mL 0  . levothyroxine (SYNTHROID, LEVOTHROID) 75 MCG tablet Take 75 mcg by mouth daily.    Marland Kitchen losartan (COZAAR) 100 MG tablet Take 1 tablet (100 mg total) by mouth daily as needed. 30 tablet 6  . metoprolol (LOPRESSOR) 50 MG tablet Take 1 tablet (50 mg total) by mouth 2 (two) times daily. (Patient taking differently: Take 50 mg by mouth daily. ) 180 tablet 3  . montelukast (SINGULAIR) 10 MG tablet Take 10 mg by mouth at bedtime.    . Multiple Vitamin (MULTIVITAMIN) tablet Take 1 tablet by mouth daily.    . Multiple Vitamins-Minerals (PRESERVISION AREDS 2 PO) Take by mouth 2 (two) times daily.    . nitroGLYCERIN (NITROSTAT) 0.4 MG SL tablet Place 1 tablet (0.4 mg total) under the tongue every 5 (five) minutes as needed for chest pain. 25 tablet 6  . sertraline (ZOLOFT) 50 MG tablet Take 50 mg by mouth daily.    Marland Kitchen torsemide (DEMADEX) 20 MG tablet Take 1 tablet (20 mg total) by mouth 2 (two) times daily as needed. 180 tablet 3  . Vitamin  D, Ergocalciferol, (DRISDOL) 50000 units CAPS capsule Take 50,000 Units by mouth every 7 (seven) days.     No current facility-administered medications for this visit.     PHYSICAL EXAMINATION:  ECOG PERFORMANCE STATUS: 2 - Symptomatic, <50% confined to bed   Vitals:   05/13/17 1345 05/13/17 1348  BP: 121/79   Pulse: 70   Resp: 16   Temp: 97.6 F (36.4 C)   SpO2:  97%    Filed Weights     Physical Exam  Constitutional: She is well-developed, well-nourished, and in no distress. No distress.  HENT:  Head: Normocephalic and atraumatic.  Dry oral mucosa  Eyes: Conjunctivae and EOM are normal. Pupils are equal, round, and reactive to light.  Neck: Normal range of motion. Neck supple.  Cardiovascular: Normal rate, regular rhythm and normal heart sounds.  Pulmonary/Chest: Effort normal. No respiratory distress. She has no wheezes. She has no rales.  Abdominal: Soft. Bowel sounds are normal. She exhibits no distension.  Musculoskeletal: Normal range of motion. She exhibits no edema.  Neurological: She is alert. No cranial nerve deficit.  Skin: Skin is warm and dry.  Psychiatric: Affect normal.    LABORATORY DATA: I have personally reviewed the data as listed:  No visits with results within 1 Month(s) from this visit.  Latest known visit with results is:  Clinical Support on 04/09/2017  Component Date Value Ref Range Status  . Date Time Interrogation Session 04/09/2017 [id number]   Final  . Pulse Generator Manufacturer 04/09/2017 MERM   Final  . Pulse Gen Model 04/09/2017 [id number] Adapta   Final  . Pulse Gen Serial Number 04/09/2017 [id number]   Final  . Clinic Name 04/09/2017 [facility]   Final  . Implantable Pulse Generator Type 04/09/2017 Implantable Pulse Generator   Final  . Implantable Pulse Generator Implan* 04/09/2017 [id number]   Final  . Implantable Lead Manufacturer 04/09/2017 MERM   Final  . Implantable Lead Model 04/09/2017 5076 CapSureFix Novus   Final   . Implantable Lead Serial Number 04/09/2017 [id number]   Final  . Implantable Lead Implant Date 04/09/2017 [id number]   Final  . Implantable Lead Location Detail 1 04/09/2017 APPENDAGE   Final  . Implantable Lead Location  04/09/2017 [id number]   Final  . Implantable Lead Manufacturer 04/09/2017 MERM   Final  . Implantable Lead Model 04/09/2017 5076 CapSureFix Novus   Final  . Implantable Lead Serial Number 04/09/2017 [id number]   Final  . Implantable Lead Implant Date 04/09/2017 [id number]   Final  . Implantable Lead Location Detail 1 04/09/2017 APEX   Final  . Implantable Lead Location 04/09/2017 [id number]   Final  . Lead Channel Setting Sensing Sensi* 04/09/2017 2.80  mV Final  . Lead Channel Setting Pacing Amplit* 04/09/2017 2.250  V Final  . Lead Channel Setting Pacing Pulse * 04/09/2017 0.40  ms Final  . Lead Channel Setting Pacing Amplit* 04/09/2017 3.250  V Final  . Lead Channel Impedance Value 04/09/2017 370  ohm Final  . Lead Channel Pacing Threshold Ampl* 04/09/2017 1.125  V Final  . Lead Channel Pacing Threshold Puls* 04/09/2017 0.40  ms Final  . Lead Channel Impedance Value 04/09/2017 461  ohm Final  . Lead Channel Sensing Intrinsic Amp* 04/09/2017 5.6  mV Final  . Lead Channel Pacing Threshold Ampl* 04/09/2017 1.625  V Final  . Lead Channel Pacing Threshold Puls* 04/09/2017 0.40  ms Final  . Battery Status 04/09/2017 OK   Final  . Battery Remaining Longevity 04/09/2017 123  mo Final  . Battery Voltage 04/09/2017 2.79  V Final  . Battery Impedance 04/09/2017 152  ohm Final  . Brady Statistic AP VP Percent 04/09/2017 0  % Final  . Brady Statistic AS VP Percent 04/09/2017 0  % Final  . Brady Statistic AP VS Percent 04/09/2017 97  % Final  . Brady Statistic AS VS Percent 04/09/2017 3  % Final  . Eval Rhythm 04/09/2017 ApVs   Final    RADIOGRAPHIC STUDIES: I have personally reviewed the radiological images as listed and agree with the findings in the report CT abdomen pelvis  07/03/2016.  IMPRESSION: 1. Abnormal soft tissue mass in the region of the vagina worrisome recurrence of carcinoma. This lesion may invade the adjacent perirectal fat planes and possibly the base of the urinary bladder. 2. Small right pleural effusion. 3. Changes of cirrhosis are again noted. 4. Significant abdominal aortic atherosclerotic change. 5. Small amount of free fluid in the pelvis. 6. Diffuse osteopenia.   ASSESSMENT/PLAN 1. Iron deficiency anemia due to chronic blood loss   2. Leukocytosis, unspecified type   3. Dehydration    1  Iron deficiency, improved after iron infusion. Hold additional iron infusion at this point. 2 hemoglobin improved to 13.4 today, however I believe there is a component of dehydration/overdiuresis. I suggest patient to hold tosemide for a few days as well as gentle oral hydration. Follow up with cardiologist for further titration diuretics.  3 Continue to follow up with Dr.Hande and she got labs done with him every 6 weeks. I will hold drawing labs at cancer center if she could have CBC done with Dr.Hande.  4. Leukocytosis, predominantly neutrophilia, likely due to recent UTI. Continue to monitor.   Follow up in 6 weeks.  All questions were answered. The patient knows to call the clinic with any problems, questions or concerns.  Earlie Server, MD, PhD Hematology Oncology [facility] Pager- [phone] 05/13/2017

## 2017-05-13 ENCOUNTER — Inpatient Hospital Stay: Payer: Medicare HMO

## 2017-05-13 ENCOUNTER — Encounter: Payer: Self-pay | Admitting: Oncology

## 2017-05-13 ENCOUNTER — Inpatient Hospital Stay: Payer: Medicare HMO | Attending: Oncology | Admitting: Oncology

## 2017-05-13 VITALS — BP 121/79 | HR 70 | Temp 97.6°F | Resp 16

## 2017-05-13 DIAGNOSIS — Z8542 Personal history of malignant neoplasm of other parts of uterus: Secondary | ICD-10-CM | POA: Diagnosis not present

## 2017-05-13 DIAGNOSIS — D500 Iron deficiency anemia secondary to blood loss (chronic): Secondary | ICD-10-CM

## 2017-05-13 DIAGNOSIS — M8580 Other specified disorders of bone density and structure, unspecified site: Secondary | ICD-10-CM | POA: Diagnosis not present

## 2017-05-13 DIAGNOSIS — I10 Essential (primary) hypertension: Secondary | ICD-10-CM | POA: Diagnosis not present

## 2017-05-13 DIAGNOSIS — Z8719 Personal history of other diseases of the digestive system: Secondary | ICD-10-CM | POA: Insufficient documentation

## 2017-05-13 DIAGNOSIS — G4730 Sleep apnea, unspecified: Secondary | ICD-10-CM | POA: Insufficient documentation

## 2017-05-13 DIAGNOSIS — K219 Gastro-esophageal reflux disease without esophagitis: Secondary | ICD-10-CM | POA: Diagnosis not present

## 2017-05-13 DIAGNOSIS — E860 Dehydration: Secondary | ICD-10-CM | POA: Insufficient documentation

## 2017-05-13 DIAGNOSIS — I509 Heart failure, unspecified: Secondary | ICD-10-CM | POA: Insufficient documentation

## 2017-05-13 DIAGNOSIS — D509 Iron deficiency anemia, unspecified: Secondary | ICD-10-CM | POA: Diagnosis not present

## 2017-05-13 DIAGNOSIS — I4891 Unspecified atrial fibrillation: Secondary | ICD-10-CM | POA: Diagnosis not present

## 2017-05-13 DIAGNOSIS — D72829 Elevated white blood cell count, unspecified: Secondary | ICD-10-CM

## 2017-05-13 DIAGNOSIS — K921 Melena: Secondary | ICD-10-CM | POA: Diagnosis not present

## 2017-05-13 DIAGNOSIS — E039 Hypothyroidism, unspecified: Secondary | ICD-10-CM | POA: Insufficient documentation

## 2017-05-13 DIAGNOSIS — K7460 Unspecified cirrhosis of liver: Secondary | ICD-10-CM | POA: Insufficient documentation

## 2017-05-13 DIAGNOSIS — R5383 Other fatigue: Secondary | ICD-10-CM

## 2017-05-13 DIAGNOSIS — Z85828 Personal history of other malignant neoplasm of skin: Secondary | ICD-10-CM | POA: Insufficient documentation

## 2017-05-13 DIAGNOSIS — Z87440 Personal history of urinary (tract) infections: Secondary | ICD-10-CM | POA: Insufficient documentation

## 2017-05-13 DIAGNOSIS — Z79899 Other long term (current) drug therapy: Secondary | ICD-10-CM | POA: Diagnosis not present

## 2017-05-13 DIAGNOSIS — Z923 Personal history of irradiation: Secondary | ICD-10-CM | POA: Insufficient documentation

## 2017-05-13 LAB — CBC WITH DIFFERENTIAL/PLATELET
Basophils Absolute: 0.2 10*3/uL — ABNORMAL HIGH (ref 0–0.1)
Basophils Relative: 1 %
EOS ABS: 0.4 10*3/uL (ref 0–0.7)
Eosinophils Relative: 3 %
HCT: 41.2 % (ref 35.0–47.0)
HEMOGLOBIN: 13.4 g/dL (ref 12.0–16.0)
LYMPHS ABS: 2 10*3/uL (ref 1.0–3.6)
LYMPHS PCT: 17 %
MCH: 28.7 pg (ref 26.0–34.0)
MCHC: 32.5 g/dL (ref 32.0–36.0)
MCV: 88.2 fL (ref 80.0–100.0)
Monocytes Absolute: 0.5 10*3/uL (ref 0.2–0.9)
Monocytes Relative: 5 %
NEUTROS PCT: 74 %
Neutro Abs: 8.8 10*3/uL — ABNORMAL HIGH (ref 1.4–6.5)
Platelets: 149 10*3/uL — ABNORMAL LOW (ref 150–440)
RBC: 4.66 MIL/uL (ref 3.80–5.20)
RDW: 17.9 % — ABNORMAL HIGH (ref 11.5–14.5)
WBC: 11.9 10*3/uL — AB (ref 3.6–11.0)

## 2017-05-13 LAB — FERRITIN: Ferritin: 69 ng/mL (ref 11–307)

## 2017-05-13 LAB — IRON AND TIBC
Iron: 46 ug/dL (ref 28–170)
Saturation Ratios: 17 % (ref 10.4–31.8)
TIBC: 275 ug/dL (ref 250–450)
UIBC: 229 ug/dL

## 2017-05-13 NOTE — Progress Notes (Signed)
Patient here for follow up with labs today. She states that she is feeling very weak and tired. She complains of shortness of breath with no exertion. Her O2 sat is 97%. Heart rate is irregular today, but daughter states that patient has a history of A-Fib and has a pacemaker.

## 2017-05-14 DIAGNOSIS — F0390 Unspecified dementia without behavioral disturbance: Secondary | ICD-10-CM | POA: Diagnosis not present

## 2017-05-14 DIAGNOSIS — I482 Chronic atrial fibrillation: Secondary | ICD-10-CM | POA: Diagnosis not present

## 2017-05-14 DIAGNOSIS — M810 Age-related osteoporosis without current pathological fracture: Secondary | ICD-10-CM | POA: Diagnosis not present

## 2017-05-14 DIAGNOSIS — M6281 Muscle weakness (generalized): Secondary | ICD-10-CM | POA: Diagnosis not present

## 2017-05-14 DIAGNOSIS — I2510 Atherosclerotic heart disease of native coronary artery without angina pectoris: Secondary | ICD-10-CM | POA: Diagnosis not present

## 2017-05-14 DIAGNOSIS — D500 Iron deficiency anemia secondary to blood loss (chronic): Secondary | ICD-10-CM | POA: Diagnosis not present

## 2017-05-14 DIAGNOSIS — I5032 Chronic diastolic (congestive) heart failure: Secondary | ICD-10-CM | POA: Diagnosis not present

## 2017-05-14 DIAGNOSIS — F329 Major depressive disorder, single episode, unspecified: Secondary | ICD-10-CM | POA: Diagnosis not present

## 2017-05-14 DIAGNOSIS — I110 Hypertensive heart disease with heart failure: Secondary | ICD-10-CM | POA: Diagnosis not present

## 2017-05-16 ENCOUNTER — Other Ambulatory Visit: Payer: Self-pay | Admitting: Physician Assistant

## 2017-05-16 ENCOUNTER — Other Ambulatory Visit
Admission: RE | Admit: 2017-05-16 | Discharge: 2017-05-16 | Disposition: A | Discharge status: Home / Self Care | Payer: Medicare HMO | Source: Ambulatory Visit | Attending: Physician Assistant | Admitting: Physician Assistant

## 2017-05-16 ENCOUNTER — Ambulatory Visit
Admission: RE | Admit: 2017-05-16 | Discharge: 2017-05-16 | Disposition: A | Discharge status: Home / Self Care | Payer: Medicare HMO | Source: Ambulatory Visit | Attending: Physician Assistant | Admitting: Physician Assistant

## 2017-05-16 DIAGNOSIS — R531 Weakness: Secondary | ICD-10-CM

## 2017-05-16 DIAGNOSIS — R410 Disorientation, unspecified: Secondary | ICD-10-CM

## 2017-05-16 DIAGNOSIS — K7460 Unspecified cirrhosis of liver: Secondary | ICD-10-CM | POA: Diagnosis not present

## 2017-05-16 DIAGNOSIS — I6782 Cerebral ischemia: Secondary | ICD-10-CM | POA: Diagnosis not present

## 2017-05-16 DIAGNOSIS — G319 Degenerative disease of nervous system, unspecified: Secondary | ICD-10-CM | POA: Diagnosis not present

## 2017-05-16 DIAGNOSIS — N3000 Acute cystitis without hematuria: Secondary | ICD-10-CM | POA: Diagnosis not present

## 2017-05-16 DIAGNOSIS — S0990XA Unspecified injury of head, initial encounter: Secondary | ICD-10-CM | POA: Diagnosis not present

## 2017-05-17 ENCOUNTER — Other Ambulatory Visit: Payer: Self-pay | Admitting: Physician Assistant

## 2017-05-17 ENCOUNTER — Other Ambulatory Visit
Admission: RE | Admit: 2017-05-17 | Discharge: 2017-05-17 | Disposition: A | Discharge status: Home / Self Care | Payer: Medicare HMO | Source: Ambulatory Visit | Attending: Physician Assistant | Admitting: Physician Assistant

## 2017-05-17 DIAGNOSIS — M6281 Muscle weakness (generalized): Secondary | ICD-10-CM | POA: Diagnosis not present

## 2017-05-17 DIAGNOSIS — K7460 Unspecified cirrhosis of liver: Secondary | ICD-10-CM | POA: Diagnosis not present

## 2017-05-17 DIAGNOSIS — R410 Disorientation, unspecified: Secondary | ICD-10-CM | POA: Diagnosis not present

## 2017-05-17 DIAGNOSIS — I2510 Atherosclerotic heart disease of native coronary artery without angina pectoris: Secondary | ICD-10-CM | POA: Diagnosis not present

## 2017-05-17 DIAGNOSIS — I110 Hypertensive heart disease with heart failure: Secondary | ICD-10-CM | POA: Diagnosis not present

## 2017-05-17 DIAGNOSIS — R531 Weakness: Secondary | ICD-10-CM | POA: Diagnosis not present

## 2017-05-17 DIAGNOSIS — F0390 Unspecified dementia without behavioral disturbance: Secondary | ICD-10-CM | POA: Diagnosis not present

## 2017-05-17 DIAGNOSIS — N3000 Acute cystitis without hematuria: Secondary | ICD-10-CM | POA: Diagnosis not present

## 2017-05-17 DIAGNOSIS — D500 Iron deficiency anemia secondary to blood loss (chronic): Secondary | ICD-10-CM | POA: Diagnosis not present

## 2017-05-17 DIAGNOSIS — I5032 Chronic diastolic (congestive) heart failure: Secondary | ICD-10-CM | POA: Diagnosis not present

## 2017-05-17 DIAGNOSIS — M810 Age-related osteoporosis without current pathological fracture: Secondary | ICD-10-CM | POA: Diagnosis not present

## 2017-05-17 DIAGNOSIS — F329 Major depressive disorder, single episode, unspecified: Secondary | ICD-10-CM | POA: Diagnosis not present

## 2017-05-17 DIAGNOSIS — I482 Chronic atrial fibrillation: Secondary | ICD-10-CM | POA: Diagnosis not present

## 2017-05-17 LAB — AMMONIA: AMMONIA: 31 umol/L (ref 9–35)

## 2017-05-30 DIAGNOSIS — I482 Chronic atrial fibrillation: Secondary | ICD-10-CM | POA: Diagnosis not present

## 2017-05-30 DIAGNOSIS — F329 Major depressive disorder, single episode, unspecified: Secondary | ICD-10-CM | POA: Diagnosis not present

## 2017-05-30 DIAGNOSIS — M6281 Muscle weakness (generalized): Secondary | ICD-10-CM | POA: Diagnosis not present

## 2017-05-30 DIAGNOSIS — I110 Hypertensive heart disease with heart failure: Secondary | ICD-10-CM | POA: Diagnosis not present

## 2017-05-30 DIAGNOSIS — D500 Iron deficiency anemia secondary to blood loss (chronic): Secondary | ICD-10-CM | POA: Diagnosis not present

## 2017-05-30 DIAGNOSIS — I5032 Chronic diastolic (congestive) heart failure: Secondary | ICD-10-CM | POA: Diagnosis not present

## 2017-05-30 DIAGNOSIS — F0390 Unspecified dementia without behavioral disturbance: Secondary | ICD-10-CM | POA: Diagnosis not present

## 2017-05-30 DIAGNOSIS — M810 Age-related osteoporosis without current pathological fracture: Secondary | ICD-10-CM | POA: Diagnosis not present

## 2017-05-30 DIAGNOSIS — I2510 Atherosclerotic heart disease of native coronary artery without angina pectoris: Secondary | ICD-10-CM | POA: Diagnosis not present

## 2017-05-31 DIAGNOSIS — I10 Essential (primary) hypertension: Secondary | ICD-10-CM | POA: Diagnosis not present

## 2017-05-31 DIAGNOSIS — K7460 Unspecified cirrhosis of liver: Secondary | ICD-10-CM | POA: Diagnosis not present

## 2017-05-31 DIAGNOSIS — I480 Paroxysmal atrial fibrillation: Secondary | ICD-10-CM | POA: Diagnosis not present

## 2017-05-31 DIAGNOSIS — E785 Hyperlipidemia, unspecified: Secondary | ICD-10-CM | POA: Diagnosis not present

## 2017-06-03 DIAGNOSIS — I110 Hypertensive heart disease with heart failure: Secondary | ICD-10-CM | POA: Diagnosis not present

## 2017-06-03 DIAGNOSIS — I482 Chronic atrial fibrillation: Secondary | ICD-10-CM | POA: Diagnosis not present

## 2017-06-03 DIAGNOSIS — F329 Major depressive disorder, single episode, unspecified: Secondary | ICD-10-CM | POA: Diagnosis not present

## 2017-06-03 DIAGNOSIS — M6281 Muscle weakness (generalized): Secondary | ICD-10-CM | POA: Diagnosis not present

## 2017-06-03 DIAGNOSIS — F0390 Unspecified dementia without behavioral disturbance: Secondary | ICD-10-CM | POA: Diagnosis not present

## 2017-06-03 DIAGNOSIS — D500 Iron deficiency anemia secondary to blood loss (chronic): Secondary | ICD-10-CM | POA: Diagnosis not present

## 2017-06-03 DIAGNOSIS — I5032 Chronic diastolic (congestive) heart failure: Secondary | ICD-10-CM | POA: Diagnosis not present

## 2017-06-03 DIAGNOSIS — I2510 Atherosclerotic heart disease of native coronary artery without angina pectoris: Secondary | ICD-10-CM | POA: Diagnosis not present

## 2017-06-03 DIAGNOSIS — M810 Age-related osteoporosis without current pathological fracture: Secondary | ICD-10-CM | POA: Diagnosis not present

## 2017-06-06 DIAGNOSIS — D500 Iron deficiency anemia secondary to blood loss (chronic): Secondary | ICD-10-CM | POA: Diagnosis not present

## 2017-06-06 DIAGNOSIS — M810 Age-related osteoporosis without current pathological fracture: Secondary | ICD-10-CM | POA: Diagnosis not present

## 2017-06-06 DIAGNOSIS — I482 Chronic atrial fibrillation: Secondary | ICD-10-CM | POA: Diagnosis not present

## 2017-06-06 DIAGNOSIS — I110 Hypertensive heart disease with heart failure: Secondary | ICD-10-CM | POA: Diagnosis not present

## 2017-06-06 DIAGNOSIS — I2510 Atherosclerotic heart disease of native coronary artery without angina pectoris: Secondary | ICD-10-CM | POA: Diagnosis not present

## 2017-06-06 DIAGNOSIS — I5032 Chronic diastolic (congestive) heart failure: Secondary | ICD-10-CM | POA: Diagnosis not present

## 2017-06-06 DIAGNOSIS — F0390 Unspecified dementia without behavioral disturbance: Secondary | ICD-10-CM | POA: Diagnosis not present

## 2017-06-06 DIAGNOSIS — F329 Major depressive disorder, single episode, unspecified: Secondary | ICD-10-CM | POA: Diagnosis not present

## 2017-06-06 DIAGNOSIS — M6281 Muscle weakness (generalized): Secondary | ICD-10-CM | POA: Diagnosis not present

## 2017-06-06 NOTE — Progress Notes (Signed)
Cardiology Office Note  Date:  06/07/2017   ID:  Kathryn Short, DOB Feb 21, 1922, MRN [id number]  PCP:  Kathryn Harrier, MD   Chief Complaint  Patient presents with  . other    6 wk f/u sob. Meds reviewed verbally with pt.    HPI:  Kathryn Short is an 83 year old woman with  paroxysmal atrial fibrillation (last episode in March 2119),  diastolic CHF on a  diuretic daily,  labile hypertension,  sick sinus syndrome, pacemaker placement followed by Dr. Caryl Short,  in hospital [facility] on 02/16/2012 in atrial fibrillation.  open cholecystectomy, partial colon resection and colostomy, appendectomy, hysterectomy, oophorectomy, GYN malignancy treated with radiation in the 1950s bowel obstruction December 2015 She presents for routine follow-up of her blood pressure, diastolic CHF and atrial fibrillation  In follow-up today she presents with her daughter Reports having urinary tract infection, on amoxicillin Urine cultures reviewed showing enterococcus, pansensitive She is feeling weaker, eating less  Daughter reports edema has been stable She has been having dizziness recently particular when standing working with physical therapy  Lab work reviewed showing HCT 39, albumin 2.8  Previous pacer download reviewed showing 1.6% in atrial fibrillation longest was 17 hours  EKG personally reviewed by myself on todays visit Shows paced rhythm at 60 bpm nonspecific ST abnormality  Other past medical history reviewed hospitalization for GI bleed 01/02/2017 Blood in the toilet, went to the hospital Was anemic Required transfusion 1. Hemoglobin down to 7.5 known soft tissue mass in vaginal area  eliquis stopped , started on asa 325 at discharge (?)  Asa changed to eliquis 2.5 BID as an outpt  Readmission for GI bleed 02/28/2017 Weak, HGB 7.6 PRBC transfusion, up to 8.8 at the time of discharge Again was discharged on aspirin As an outpatient we recommended she hold the aspirin given no  stroke benefit from her atrial fibrillation  In follow-up today she has slight worsening of her leg swelling, general malaise, fatigue Reports having a cough that Short and goes  less communicative, likely from hard of hearing Has been taking torsemide 20 g daily Recently received iron infusion She is having bad diarrhea every other day after she takes lactulose. Feels wiped out  She is not taking clonidine and losartan, blood pressure has been low  EKG personally reviewed by myself on todays visit Shows paced rhythm 61 bpm no significant ST or T-wave changes  Other past medical history reviewed  On previous office visit she reported having symptoms of Vomiting, abdominal cramps Went to the hospital and had CT ABD:  aortic atherosclerotic Questionable mass but was seen by OB/GYN and was cleared  Lab work January 2018 creatinine 1.3 Most recent creatinine 1.2 Chronic urinary tract infection TSH 11  EKG on today's visit shows paced rhythm, atrial paced, 61 bpm   Other past medical history clinic 8/9, Noted to have increased atrial fibrillation burden on pacer download January 03 2016 recommended she increase her amiodarone, is on anticoagulation Low BP, head fullness Recommended that she restart her Aldactone as blood pressure improves  Seen by Dr. Caryl Short August 15, recommended she increase her torsemide for several days Seen by primary care 8/15: urine UTI No Urine cx available to date BMP shows creatinine continues to run higher consistent with prerenal state Taking torsemide daily with 1.5 torsemide on weekends, Aldactone added every other day  Previous CT scan done by primary care showing small to moderate right pleural effusion, evidence of cirrhosis Mention of cirrhosis back on CT  scan in 2015 Mildly elevated LFTs over the past year  in the hospital 05/25/2014, with small bowel obstruction. She was placed on bowel rest, IV fluids and she passed her bowels. She did  not require surgery  Echocardiogram done in the hospital 05/25/2014 showing ejection fraction greater than 55%, moderate pulmonary hypertension, mild to moderate TR  Earlier in 2015, she had atrial tachycardia and pacer was tracking the atrial rhythm, Her device was reprogrammed in the office for DDI  She was continued on her beta blocker and amiodarone and rhythm broke, converting back to normal sinus rhythm. Pacemaker has been since changed back to her prior settings  She is reluctant to use a walker, still uses a cane. Anticoagulation discussed previouslywith the patient and her daughter. They are not interested. They preferred to stay on aspirin  history of falls with trauma requiring stitches. None recently Patient and daughter are afraid of her fall risk, other complications from anticoagulation. Currently not on anticoagulation at their request. They are aware of risks and benefits associated with arrhythmia and stroke.  Previous lab work; Total cholesterol 189, LDL 104, hematocrit 38, creatinine 0.8  previous visits to the emergency room with chest pain, dizziness, malaise. She had a problem on isosorbide in the past. Feelings of insomnia, and anxious feeling, chest pain episodes at night that wake her from sleep, weakness, nausea, dizziness. She has obstructive sleep apnea and does not use her BiPAP at nighttime  Echocardiogram in the hospital showed ejection fraction 50-55%, moderate LVH, mild to moderate TR, right ventricular systolic pressure 40 mm of mercury, mild to moderate aortic valve insufficiency  PMH:   has a past medical history of Atrial fibrillation ([place]), Bowel obstruction ([place]), CHF (congestive heart failure) ([place]), Fall, GERD (gastroesophageal reflux disease), Hypertension, Hypothyroidism, Iron deficiency anemia due to chronic blood loss (02/05/2017), Iron deficiency anemia due to chronic blood loss (02/05/2017), Melanoma ([place]), OSA (obstructive sleep apnea),  and Uterine cancer ([place]).  PSH:    Past Surgical History:  Procedure Laterality Date  . CHOLECYSTECTOMY    . COLOSTOMY     For perforation  . Cytoscopy    . EP IMPLANTABLE DEVICE N/A 09/07/2015   Procedure:  PPM Generator Changeout;  Surgeon: Deboraha Sprang, MD;  Location: [facility];  Service: Cardiovascular;  Laterality: N/A;  . INSERT / REPLACE / REMOVE PACEMAKER     Implantation-[employer]  . SKIN BIOPSY     face  . TOTAL ABDOMINAL HYSTERECTOMY W/ BILATERAL SALPINGOOPHORECTOMY      Current Outpatient Medications  Medication Sig Dispense Refill  . acetaminophen (TYLENOL) 500 MG tablet Take 1,000 mg by mouth daily as needed for mild pain or headache.     . albuterol (PROVENTIL HFA;VENTOLIN HFA) 108 (90 BASE) MCG/ACT inhaler Inhale 2 puffs into the lungs every 6 (six) hours as needed for wheezing or shortness of breath.     Marland Kitchen amiodarone (PACERONE) 200 MG tablet TAKE ONE (1) TABLET BY MOUTH EVERY DAY (Patient taking differently: 1/2 tablet daily) 90 tablet 3  . amoxicillin (AMOXIL) 500 MG capsule Take 500 mg by mouth 3 (three) times daily.     Marland Kitchen b complex vitamins tablet Take 1 tablet by mouth daily.    . Carboxymethylcellul-Glycerin (LUBRICATING EYE DROPS OP) Place 2 drops into both eyes 2 (two) times daily.    . clonazePAM (KLONOPIN) 0.5 MG tablet Take 0.5 mg by mouth 2 (two) times daily as needed for anxiety.    . cloNIDine (CATAPRES) 0.1 MG  tablet Take 1 tablet (0.1 mg total) by mouth as needed. 60 tablet 6  . Docusate Calcium (STOOL SOFTENER PO) Take at bedtime by mouth.    . lactulose (CHRONULAC) 10 GM/15ML solution Take 45 mLs (30 g total) by mouth every other day. 240 mL 0  . levothyroxine (SYNTHROID, LEVOTHROID) 88 MCG tablet Take 88 mcg by mouth daily before breakfast.    . losartan (COZAAR) 100 MG tablet Take 1 tablet (100 mg total) by mouth daily as needed. 30 tablet 6  . metoprolol (LOPRESSOR) 50 MG tablet Take 1 tablet (50 mg total) by mouth 2 (two) times daily.  (Patient taking differently: Take 25 mg by mouth 2 (two) times daily. ) 180 tablet 3  . montelukast (SINGULAIR) 10 MG tablet Take 10 mg by mouth at bedtime.    . Multiple Vitamin (MULTIVITAMIN) tablet Take 1 tablet by mouth daily.    . Multiple Vitamins-Minerals (PRESERVISION AREDS 2 PO) Take by mouth 2 (two) times daily.    . nitroGLYCERIN (NITROSTAT) 0.4 MG SL tablet Place 1 tablet (0.4 mg total) under the tongue every 5 (five) minutes as needed for chest pain. 25 tablet 6  . sertraline (ZOLOFT) 50 MG tablet Take 50 mg by mouth daily.    Marland Kitchen torsemide (DEMADEX) 20 MG tablet Take 1 tablet (20 mg total) by mouth 2 (two) times daily as needed. 180 tablet 3  . Vitamin D, Ergocalciferol, (DRISDOL) 50000 units CAPS capsule Take 50,000 Units by mouth every 7 (seven) days.     No current facility-administered medications for this visit.      Allergies:   Ace inhibitors; Ambien [zolpidem tartrate]; Bactrim [sulfamethoxazole-trimethoprim]; Digoxin and related; Hctz [hydrochlorothiazide]; and Lunesta [eszopiclone]   Social History:  The patient  reports that  has never smoked. she has never used smokeless tobacco. She reports that she does not drink alcohol or use drugs.   Family History:   Family history is unknown by patient.    Review of Systems: Review of Systems  Constitutional: Positive for malaise/fatigue.  Respiratory: Negative.   Cardiovascular: Positive for leg swelling.  Gastrointestinal: Negative.   Musculoskeletal: Negative.   Neurological: Positive for weakness.  Psychiatric/Behavioral: Negative.   All other systems reviewed and are negative.    PHYSICAL EXAM: VS:  BP (!) 102/58 (BP Location: Right Arm, Patient Position: Sitting, Cuff Size: Normal)   Pulse 61   Ht 5\' 2"  (1.575 m)   Wt 135 lb 2 oz (61.3 kg)   BMI 24.71 kg/m  , BMI Body mass index is 24.71 kg/m. GEN: well developed, in no acute distress , Hard of hearing, presents in a wheelchair  HEENT: normal  Neck: no  JVD, carotid bruits, or masses Cardiac: RRR; no murmurs, rubs, or gallops, Trace bilateral pitting edema Respiratory:  clear to auscultation bilaterally, normal work of breathing GI: soft, nontender, nondistended, + BS MS: no deformity or atrophy  Skin: warm and dry, no rash Neuro:  Strength and sensation are intact Psych: euthymic mood, full affect    Recent Labs: 01/02/2017: TSH 9.127 03/01/2017: ALT 49; BUN 29; Creatinine, Ser 1.26; Potassium 3.5; Sodium 138 05/13/2017: Hemoglobin 13.4; Platelets 149    Lipid Panel No results found for: CHOL, HDL, LDLCALC, TRIG    Wt Readings from Last 3 Encounters:  06/07/17 135 lb 2 oz (61.3 kg)  04/16/17 136 lb (61.7 kg)  03/13/17 139 lb (63 kg)       ASSESSMENT AND PLAN:  Essential hypertension - Plan: EKG 12-Lead Blood  pressure running low likely secondary to urinary tract infection Recommended she avoid a dose or 2 of the torsemide Consider decreasing metoprolol down to quarter pill 12.5 mg twice daily Closely monitor blood pressure in the next week If hypotension gets worse may need alternate antibiotic for urinary tract infection  Paroxysmal atrial fibrillation ([place]) - Plan: EKG 12-Lead Paced rhythm, continue low-dose amiodarone Elevated LFTs, stable TSH stable, down to 6   Chronotropic incompetence with sinus node dysfunction  ([place]) - Plan: EKG 12-Lead Atrial paced rhythm, Managed by Dr. Caryl Short 1.6% of the time in atrial fibrillation, asymptomatic Stable  GI bleed  hold aspirin and Eliquis  Chronic diastolic CHF (congestive heart failure) ([place]) - Plan: EKG 12-Lead Tolerating torsemide 20 mg daily, with potassium Stable trace bilateral lower extremity edema Would recommend extra torsemide for leg swelling once her urinary tract infection improves and hypotension improves  Anemia Numbers much improved Previous iron infusion   Total encounter time more than 25 minutes  Greater than 50% was spent in counseling and  coordination of care with the patient   Disposition:   F/U  2 mo   Orders Placed This Encounter  Procedures  . EKG 12-Lead     Signed, Esmond Plants, M.D., Ph.D. 06/07/2017  [place], [place]

## 2017-06-07 ENCOUNTER — Encounter: Payer: Self-pay | Admitting: Cardiovascular Disease

## 2017-06-07 ENCOUNTER — Ambulatory Visit: Payer: Medicare HMO | Admitting: Cardiovascular Disease

## 2017-06-07 VITALS — BP 102/58 | HR 61 | Ht 62.0 in | Wt 135.1 lb

## 2017-06-07 DIAGNOSIS — R600 Localized edema: Secondary | ICD-10-CM | POA: Diagnosis not present

## 2017-06-07 DIAGNOSIS — I5032 Chronic diastolic (congestive) heart failure: Secondary | ICD-10-CM

## 2017-06-07 DIAGNOSIS — I471 Supraventricular tachycardia: Secondary | ICD-10-CM

## 2017-06-07 DIAGNOSIS — D649 Anemia, unspecified: Secondary | ICD-10-CM | POA: Diagnosis not present

## 2017-06-07 DIAGNOSIS — I4819 Other persistent atrial fibrillation: Secondary | ICD-10-CM

## 2017-06-07 DIAGNOSIS — I10 Essential (primary) hypertension: Secondary | ICD-10-CM

## 2017-06-07 DIAGNOSIS — I495 Sick sinus syndrome: Secondary | ICD-10-CM

## 2017-06-07 DIAGNOSIS — F419 Anxiety disorder, unspecified: Secondary | ICD-10-CM | POA: Diagnosis not present

## 2017-06-07 DIAGNOSIS — K7460 Unspecified cirrhosis of liver: Secondary | ICD-10-CM | POA: Diagnosis not present

## 2017-06-07 DIAGNOSIS — Z950 Presence of cardiac pacemaker: Secondary | ICD-10-CM | POA: Diagnosis not present

## 2017-06-07 DIAGNOSIS — I481 Persistent atrial fibrillation: Secondary | ICD-10-CM | POA: Diagnosis not present

## 2017-06-07 DIAGNOSIS — F329 Major depressive disorder, single episode, unspecified: Secondary | ICD-10-CM | POA: Diagnosis not present

## 2017-06-07 DIAGNOSIS — I480 Paroxysmal atrial fibrillation: Secondary | ICD-10-CM | POA: Diagnosis not present

## 2017-06-07 NOTE — Patient Instructions (Signed)
Medication Instructions:   No medication changes made  Labwork:  No new labs needed  Testing/Procedures:  No further testing at this time   Follow-Up: It was a pleasure seeing you in the office today. Please call us if you have new issues that need to be addressed before your next appt.  [phone]  Your physician wants you to follow-up in: 6 weeks You will receive a reminder letter in the mail two months in advance. If you don't receive a letter, please call our office to schedule the follow-up appointment.  If you need a refill on your cardiac medications before your next appointment, please call your pharmacy.

## 2017-06-10 DIAGNOSIS — F0390 Unspecified dementia without behavioral disturbance: Secondary | ICD-10-CM | POA: Diagnosis not present

## 2017-06-10 DIAGNOSIS — I5032 Chronic diastolic (congestive) heart failure: Secondary | ICD-10-CM | POA: Diagnosis not present

## 2017-06-10 DIAGNOSIS — I2510 Atherosclerotic heart disease of native coronary artery without angina pectoris: Secondary | ICD-10-CM | POA: Diagnosis not present

## 2017-06-10 DIAGNOSIS — D500 Iron deficiency anemia secondary to blood loss (chronic): Secondary | ICD-10-CM | POA: Diagnosis not present

## 2017-06-10 DIAGNOSIS — M6281 Muscle weakness (generalized): Secondary | ICD-10-CM | POA: Diagnosis not present

## 2017-06-10 DIAGNOSIS — M810 Age-related osteoporosis without current pathological fracture: Secondary | ICD-10-CM | POA: Diagnosis not present

## 2017-06-10 DIAGNOSIS — I482 Chronic atrial fibrillation: Secondary | ICD-10-CM | POA: Diagnosis not present

## 2017-06-10 DIAGNOSIS — F329 Major depressive disorder, single episode, unspecified: Secondary | ICD-10-CM | POA: Diagnosis not present

## 2017-06-10 DIAGNOSIS — I110 Hypertensive heart disease with heart failure: Secondary | ICD-10-CM | POA: Diagnosis not present

## 2017-06-12 DIAGNOSIS — I482 Chronic atrial fibrillation: Secondary | ICD-10-CM | POA: Diagnosis not present

## 2017-06-12 DIAGNOSIS — I2510 Atherosclerotic heart disease of native coronary artery without angina pectoris: Secondary | ICD-10-CM | POA: Diagnosis not present

## 2017-06-12 DIAGNOSIS — F329 Major depressive disorder, single episode, unspecified: Secondary | ICD-10-CM | POA: Diagnosis not present

## 2017-06-12 DIAGNOSIS — I110 Hypertensive heart disease with heart failure: Secondary | ICD-10-CM | POA: Diagnosis not present

## 2017-06-12 DIAGNOSIS — M6281 Muscle weakness (generalized): Secondary | ICD-10-CM | POA: Diagnosis not present

## 2017-06-12 DIAGNOSIS — F0390 Unspecified dementia without behavioral disturbance: Secondary | ICD-10-CM | POA: Diagnosis not present

## 2017-06-12 DIAGNOSIS — I5032 Chronic diastolic (congestive) heart failure: Secondary | ICD-10-CM | POA: Diagnosis not present

## 2017-06-12 DIAGNOSIS — D500 Iron deficiency anemia secondary to blood loss (chronic): Secondary | ICD-10-CM | POA: Diagnosis not present

## 2017-06-12 DIAGNOSIS — M810 Age-related osteoporosis without current pathological fracture: Secondary | ICD-10-CM | POA: Diagnosis not present

## 2017-06-18 DIAGNOSIS — I110 Hypertensive heart disease with heart failure: Secondary | ICD-10-CM | POA: Diagnosis not present

## 2017-06-18 DIAGNOSIS — M6281 Muscle weakness (generalized): Secondary | ICD-10-CM | POA: Diagnosis not present

## 2017-06-18 DIAGNOSIS — F0390 Unspecified dementia without behavioral disturbance: Secondary | ICD-10-CM | POA: Diagnosis not present

## 2017-06-18 DIAGNOSIS — I5032 Chronic diastolic (congestive) heart failure: Secondary | ICD-10-CM | POA: Diagnosis not present

## 2017-06-18 DIAGNOSIS — D500 Iron deficiency anemia secondary to blood loss (chronic): Secondary | ICD-10-CM | POA: Diagnosis not present

## 2017-06-18 DIAGNOSIS — M810 Age-related osteoporosis without current pathological fracture: Secondary | ICD-10-CM | POA: Diagnosis not present

## 2017-06-18 DIAGNOSIS — I482 Chronic atrial fibrillation: Secondary | ICD-10-CM | POA: Diagnosis not present

## 2017-06-18 DIAGNOSIS — I2510 Atherosclerotic heart disease of native coronary artery without angina pectoris: Secondary | ICD-10-CM | POA: Diagnosis not present

## 2017-06-18 DIAGNOSIS — F329 Major depressive disorder, single episode, unspecified: Secondary | ICD-10-CM | POA: Diagnosis not present

## 2017-06-21 DIAGNOSIS — I5032 Chronic diastolic (congestive) heart failure: Secondary | ICD-10-CM | POA: Diagnosis not present

## 2017-06-21 DIAGNOSIS — I2510 Atherosclerotic heart disease of native coronary artery without angina pectoris: Secondary | ICD-10-CM | POA: Diagnosis not present

## 2017-06-21 DIAGNOSIS — M6281 Muscle weakness (generalized): Secondary | ICD-10-CM | POA: Diagnosis not present

## 2017-06-21 DIAGNOSIS — I110 Hypertensive heart disease with heart failure: Secondary | ICD-10-CM | POA: Diagnosis not present

## 2017-06-21 DIAGNOSIS — M810 Age-related osteoporosis without current pathological fracture: Secondary | ICD-10-CM | POA: Diagnosis not present

## 2017-06-21 DIAGNOSIS — I482 Chronic atrial fibrillation: Secondary | ICD-10-CM | POA: Diagnosis not present

## 2017-06-21 DIAGNOSIS — D500 Iron deficiency anemia secondary to blood loss (chronic): Secondary | ICD-10-CM | POA: Diagnosis not present

## 2017-06-21 DIAGNOSIS — F0390 Unspecified dementia without behavioral disturbance: Secondary | ICD-10-CM | POA: Diagnosis not present

## 2017-06-21 DIAGNOSIS — F329 Major depressive disorder, single episode, unspecified: Secondary | ICD-10-CM | POA: Diagnosis not present

## 2017-06-24 ENCOUNTER — Inpatient Hospital Stay: Payer: Medicare HMO | Admitting: Oncology

## 2017-06-25 DIAGNOSIS — F0390 Unspecified dementia without behavioral disturbance: Secondary | ICD-10-CM | POA: Diagnosis not present

## 2017-06-25 DIAGNOSIS — F329 Major depressive disorder, single episode, unspecified: Secondary | ICD-10-CM | POA: Diagnosis not present

## 2017-06-25 DIAGNOSIS — D500 Iron deficiency anemia secondary to blood loss (chronic): Secondary | ICD-10-CM | POA: Diagnosis not present

## 2017-06-25 DIAGNOSIS — I482 Chronic atrial fibrillation: Secondary | ICD-10-CM | POA: Diagnosis not present

## 2017-06-25 DIAGNOSIS — I5032 Chronic diastolic (congestive) heart failure: Secondary | ICD-10-CM | POA: Diagnosis not present

## 2017-06-25 DIAGNOSIS — M810 Age-related osteoporosis without current pathological fracture: Secondary | ICD-10-CM | POA: Diagnosis not present

## 2017-06-25 DIAGNOSIS — I2510 Atherosclerotic heart disease of native coronary artery without angina pectoris: Secondary | ICD-10-CM | POA: Diagnosis not present

## 2017-06-25 DIAGNOSIS — I110 Hypertensive heart disease with heart failure: Secondary | ICD-10-CM | POA: Diagnosis not present

## 2017-06-25 DIAGNOSIS — M6281 Muscle weakness (generalized): Secondary | ICD-10-CM | POA: Diagnosis not present

## 2017-06-27 DIAGNOSIS — I110 Hypertensive heart disease with heart failure: Secondary | ICD-10-CM | POA: Diagnosis not present

## 2017-06-27 DIAGNOSIS — I5032 Chronic diastolic (congestive) heart failure: Secondary | ICD-10-CM | POA: Diagnosis not present

## 2017-06-27 DIAGNOSIS — I2510 Atherosclerotic heart disease of native coronary artery without angina pectoris: Secondary | ICD-10-CM | POA: Diagnosis not present

## 2017-06-27 DIAGNOSIS — D500 Iron deficiency anemia secondary to blood loss (chronic): Secondary | ICD-10-CM | POA: Diagnosis not present

## 2017-06-27 DIAGNOSIS — F0390 Unspecified dementia without behavioral disturbance: Secondary | ICD-10-CM | POA: Diagnosis not present

## 2017-06-27 DIAGNOSIS — F329 Major depressive disorder, single episode, unspecified: Secondary | ICD-10-CM | POA: Diagnosis not present

## 2017-06-27 DIAGNOSIS — I482 Chronic atrial fibrillation: Secondary | ICD-10-CM | POA: Diagnosis not present

## 2017-06-27 DIAGNOSIS — M810 Age-related osteoporosis without current pathological fracture: Secondary | ICD-10-CM | POA: Diagnosis not present

## 2017-06-27 DIAGNOSIS — M6281 Muscle weakness (generalized): Secondary | ICD-10-CM | POA: Diagnosis not present

## 2017-07-02 ENCOUNTER — Telehealth: Payer: Self-pay | Admitting: Oncology

## 2017-07-02 DIAGNOSIS — I110 Hypertensive heart disease with heart failure: Secondary | ICD-10-CM | POA: Diagnosis not present

## 2017-07-02 DIAGNOSIS — M810 Age-related osteoporosis without current pathological fracture: Secondary | ICD-10-CM | POA: Diagnosis not present

## 2017-07-02 DIAGNOSIS — I482 Chronic atrial fibrillation: Secondary | ICD-10-CM | POA: Diagnosis not present

## 2017-07-02 DIAGNOSIS — F0390 Unspecified dementia without behavioral disturbance: Secondary | ICD-10-CM | POA: Diagnosis not present

## 2017-07-02 DIAGNOSIS — D500 Iron deficiency anemia secondary to blood loss (chronic): Secondary | ICD-10-CM | POA: Diagnosis not present

## 2017-07-02 DIAGNOSIS — I5032 Chronic diastolic (congestive) heart failure: Secondary | ICD-10-CM | POA: Diagnosis not present

## 2017-07-02 DIAGNOSIS — F329 Major depressive disorder, single episode, unspecified: Secondary | ICD-10-CM | POA: Diagnosis not present

## 2017-07-02 DIAGNOSIS — M6281 Muscle weakness (generalized): Secondary | ICD-10-CM | POA: Diagnosis not present

## 2017-07-02 DIAGNOSIS — I2510 Atherosclerotic heart disease of native coronary artery without angina pectoris: Secondary | ICD-10-CM | POA: Diagnosis not present

## 2017-07-03 ENCOUNTER — Inpatient Hospital Stay: Payer: Medicare HMO | Admitting: Oncology

## 2017-07-09 ENCOUNTER — Ambulatory Visit (INDEPENDENT_AMBULATORY_CARE_PROVIDER_SITE_OTHER): Admitting: *Deleted

## 2017-07-09 ENCOUNTER — Telehealth: Payer: Self-pay | Admitting: Cardiology

## 2017-07-09 DIAGNOSIS — I495 Sick sinus syndrome: Secondary | ICD-10-CM | POA: Diagnosis not present

## 2017-07-09 DIAGNOSIS — F0150 Vascular dementia without behavioral disturbance: Secondary | ICD-10-CM | POA: Diagnosis not present

## 2017-07-09 DIAGNOSIS — I5030 Unspecified diastolic (congestive) heart failure: Secondary | ICD-10-CM | POA: Diagnosis not present

## 2017-07-09 DIAGNOSIS — I2510 Atherosclerotic heart disease of native coronary artery without angina pectoris: Secondary | ICD-10-CM | POA: Diagnosis not present

## 2017-07-09 DIAGNOSIS — I10 Essential (primary) hypertension: Secondary | ICD-10-CM | POA: Diagnosis not present

## 2017-07-09 NOTE — Telephone Encounter (Signed)
Confirmed remote transmission w/ pt daughter.   

## 2017-07-10 NOTE — Progress Notes (Signed)
Remote pacemaker transmission.   

## 2017-07-11 ENCOUNTER — Encounter: Payer: Self-pay | Admitting: Cardiology

## 2017-07-18 ENCOUNTER — Ambulatory Visit: Payer: Medicare HMO | Admitting: Cardiovascular Disease

## 2017-07-24 LAB — CUP PACEART REMOTE DEVICE CHECK
Battery Remaining Longevity: 117 mo
Battery Voltage: 2.79 V
Brady Statistic AP VP Percent: 1 %
Brady Statistic AP VS Percent: 92 %
Brady Statistic AS VP Percent: 0 %
Implantable Lead Implant Date: 20080813
Implantable Lead Location: 753860
Implantable Lead Model: 5076
Implantable Pulse Generator Implant Date: 20170329
Lead Channel Impedance Value: 369 Ohm
Lead Channel Pacing Threshold Amplitude: 0.75 V
Lead Channel Pacing Threshold Amplitude: 1.25 V
Lead Channel Pacing Threshold Pulse Width: 0.4 ms
Lead Channel Setting Pacing Amplitude: 2 V
Lead Channel Setting Pacing Pulse Width: 0.4 ms
MDC IDC LEAD IMPLANT DT: 20080813
MDC IDC LEAD LOCATION: 753859
MDC IDC MSMT BATTERY IMPEDANCE: 152 Ohm
MDC IDC MSMT LEADCHNL RV IMPEDANCE VALUE: 481 Ohm
MDC IDC MSMT LEADCHNL RV PACING THRESHOLD PULSEWIDTH: 0.4 ms
MDC IDC SESS DTM: 20190129211855
MDC IDC SET LEADCHNL RV PACING AMPLITUDE: 2.5 V
MDC IDC SET LEADCHNL RV SENSING SENSITIVITY: 4 mV
MDC IDC STAT BRADY AS VS PERCENT: 6 %

## 2017-07-29 ENCOUNTER — Telehealth: Payer: Self-pay | Admitting: Internal Medicine

## 2017-07-29 ENCOUNTER — Telehealth: Payer: Self-pay | Admitting: *Deleted

## 2017-07-29 NOTE — Telephone Encounter (Signed)
Ms. Kathryn Short calling to let us know that Ms. Kathryn Short passed away last August 24, 2022. Return kit ordered for home PPM monitor.

## 2017-08-07 NOTE — Telephone Encounter (Signed)
Closed Encounter  °

## 2017-08-09 DEATH — deceased

## 2017-08-22 ENCOUNTER — Other Ambulatory Visit: Payer: Self-pay | Admitting: Nurse Practitioner

## 2018-12-20 IMAGING — CT CT ABD-PELV W/O CM
2 of 4 series · 16 of 46 positions shown, 18 images · non-contrast
Comparison: 07/03/2016

CLINICAL DATA: Anemia and rectal bleeding, unknown blood thinners

EXAM:
CT ABDOMEN AND PELVIS WITHOUT CONTRAST
TECHNIQUE: Multidetector CT imaging of the abdomen and pelvis was performed
following the standard protocol without IV contrast.

[Series 2: axial st · axial · 0.73mm/px · z∈[-995,-590]mm · 13 of 89 slices shown, 15 images]
[im 4/89  soft-tissue]
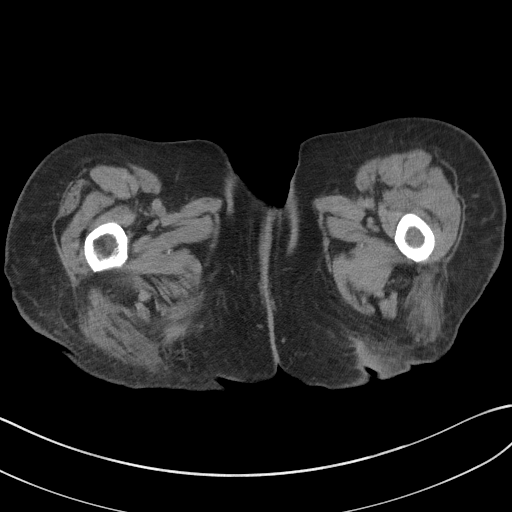
[im 4/89  bone]
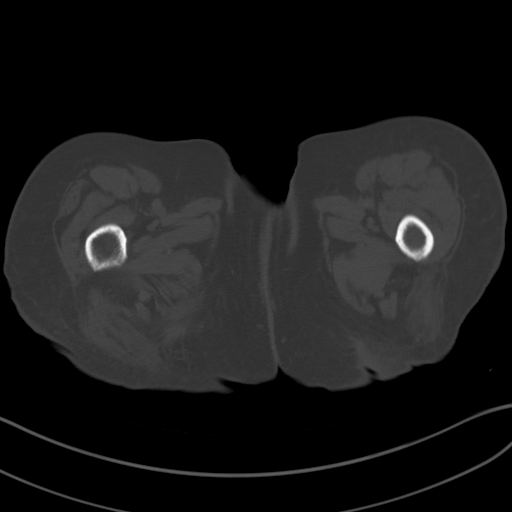
[im 11/89  soft-tissue]
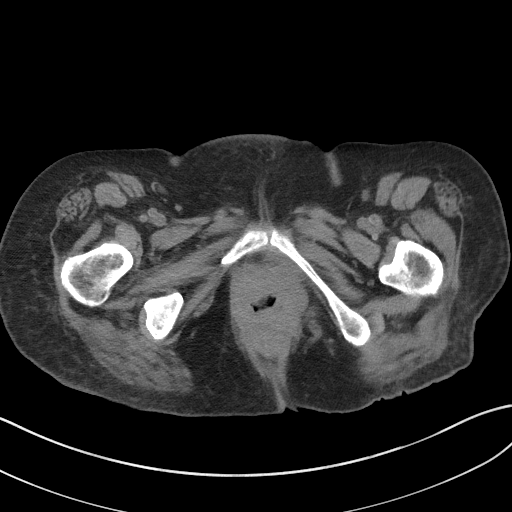
[im 17/89  soft-tissue]
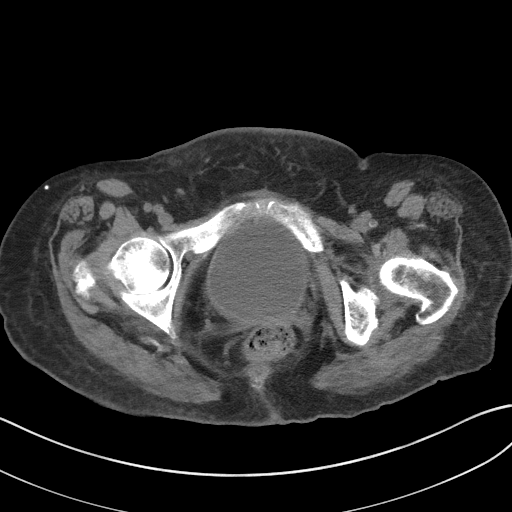
[im 24/89  soft-tissue]
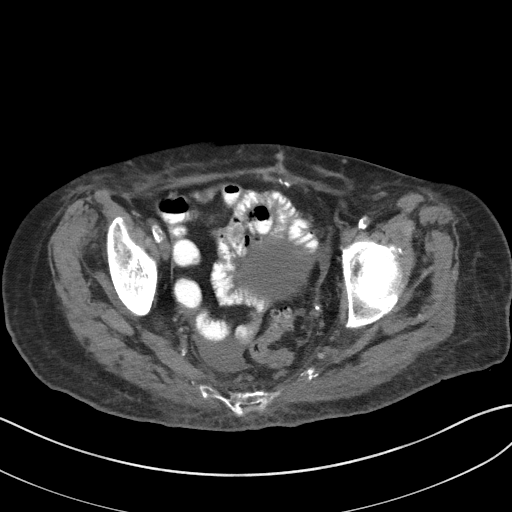
[im 31/89  soft-tissue]
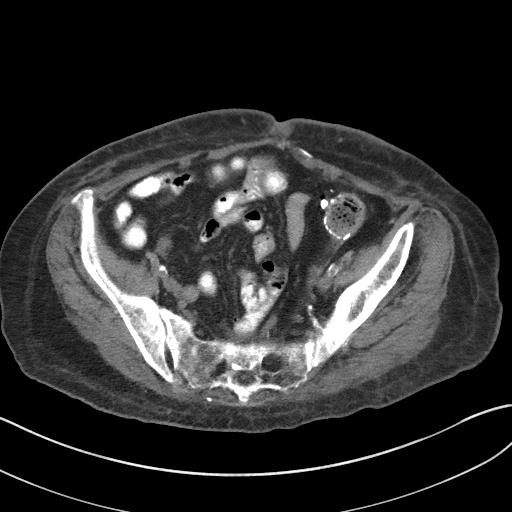
[im 38/89  soft-tissue]
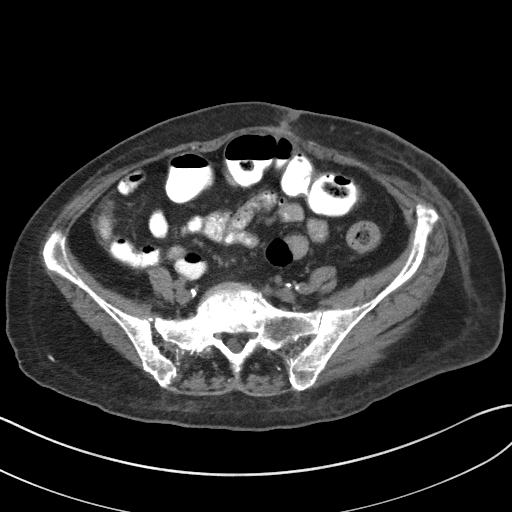
[im 45/89  soft-tissue]
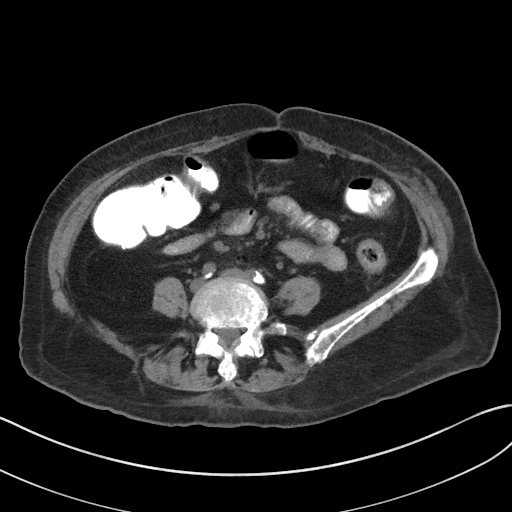
[im 51/89  soft-tissue]
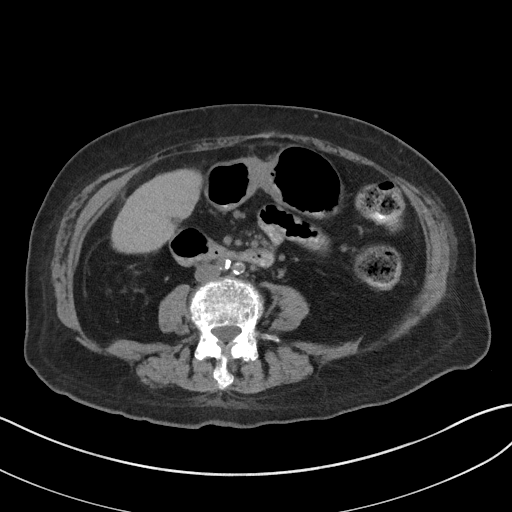
[im 58/89  soft-tissue]
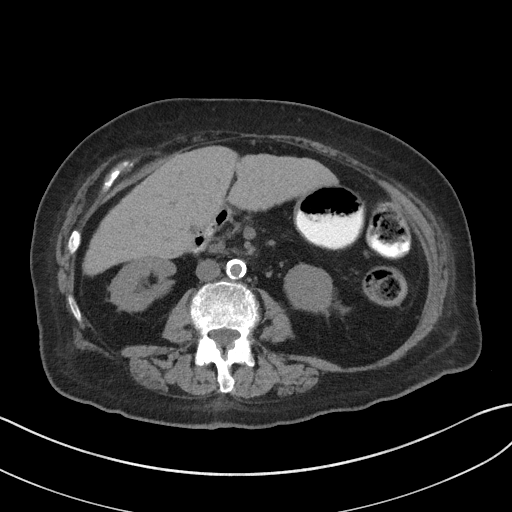
[im 58/89  bone]
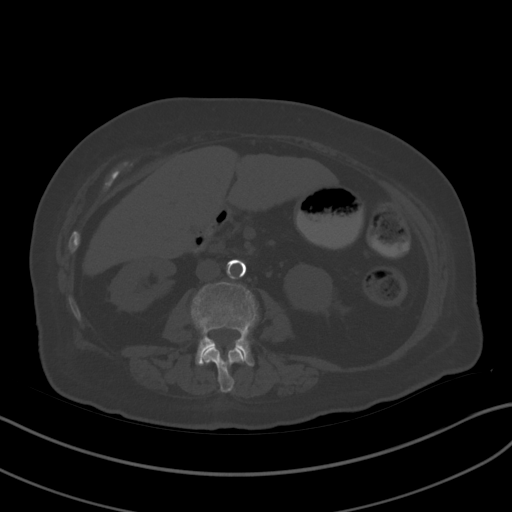
[im 65/89  soft-tissue]
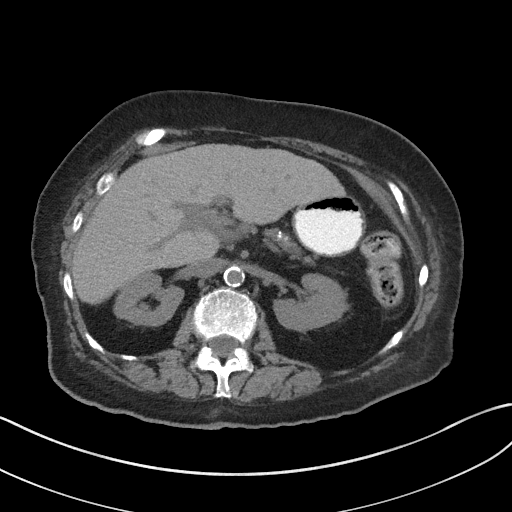
[im 72/89  soft-tissue]
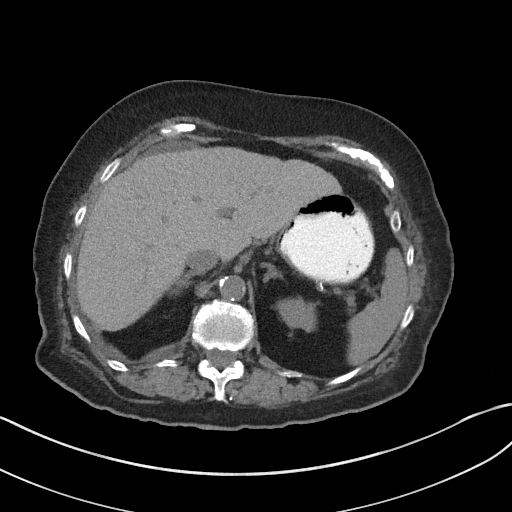
[im 78/89  soft-tissue]
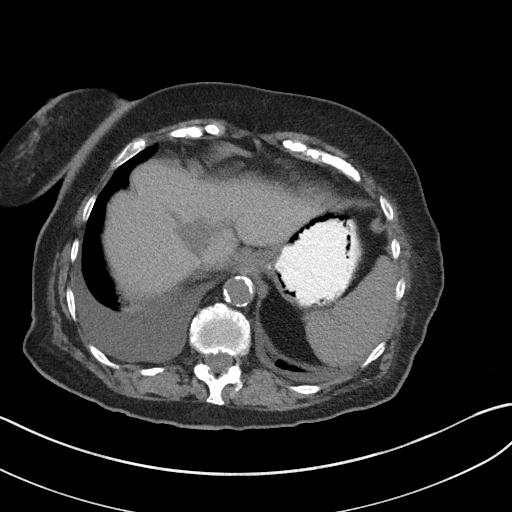
[im 85/89  soft-tissue]
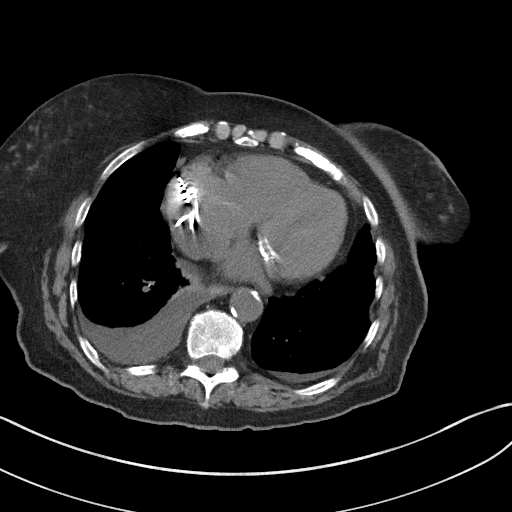

[Series 5: coronal st · coronal · 0.76mm/px · 3 of 81 slices shown]
[im 27/81  soft-tissue]
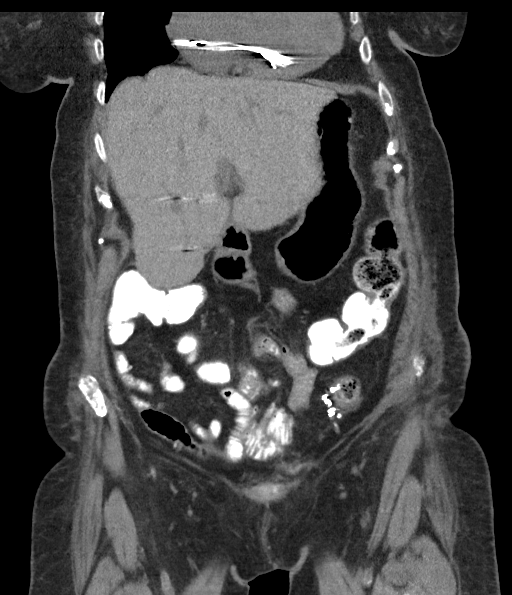
[im 36/81  soft-tissue]
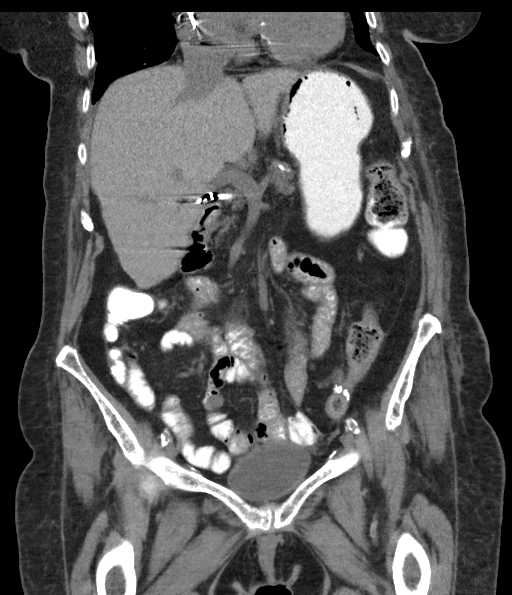
[im 45/81  soft-tissue]
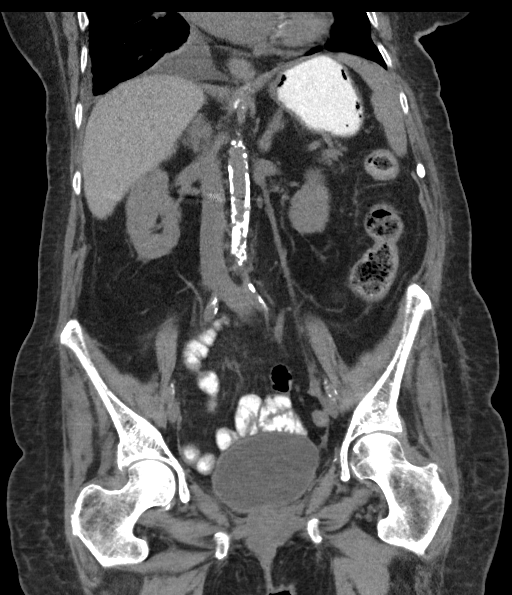

[16 of 46 positions shown; findings below may reference images not displayed]

FINDINGS: Lower chest: Bilateral pleural effusions right greater than left.
Mild right lower lobe atelectasis is seen.

Hepatobiliary: No focal liver abnormality is seen. Status post
cholecystectomy. No biliary dilatation.

Pancreas: Mild pancreatic atrophy is seen with fatty
interdigitation. No focal mass is seen.

Spleen: Normal in size without focal abnormality.

Adrenals/Urinary Tract: The adrenal glands are within normal limits.
Renal vascular calcifications are noted. No obstructive changes are
seen. No renal or ureteral stones are noted. The bladder is
partially distended.

Stomach/Bowel: Scattered diverticular change of the colon is noted.
Postsurgical changes in the sigmoid colon are seen. No definitive
inflammatory change is noted. Changes of prior right colonic surgery
are noted. No obstructive changes or inflammatory changes are seen.

Vascular/Lymphatic: Aortic atherosclerosis. No enlarged abdominal or
pelvic lymph nodes.

Reproductive: Status post hysterectomy. No adnexal masses.
Persistent soft tissue fullness is noted in the vaginal wall similar
to that seen on the prior exam. This is again suspicious for
recurrence. Blurring of the perirectal fat plane is noted distally
secondary to these changes. Some direct in growth would be difficult
to exclude. These changes are stable from the prior study.

Other: Minimal free fluid is again seen within the pelvis stable
from the prior exam.

Musculoskeletal: Degenerative changes of the lumbar spine are seen.
Mild osteopenia is noted. Stable sclerosis of S1 is noted
superiorly.
IMPRESSION: Persistent abnormal soft tissue thickness in the region of the
vagina suspicious for recurrence of carcinoma. There is blurring of
the fat plane between the vaginal vault and the adjacent rectum
similar to that seen on the prior exam also suspicious for localized
invasion. Correlation with the physical exam is recommended.

Bilateral pleural effusions right greater than left with right lower
lobe atelectasis.

Chronic changes as described above.
